# Patient Record
Sex: Male | Born: 1950 | Race: White | Hispanic: No | Marital: Married | Smoking: Never smoker
Health system: Southern US, Community
[De-identification: ages and names within clinical notes are randomized; demographics above are authoritative.]

## PROBLEM LIST (undated history)

## (undated) DIAGNOSIS — Z5181 Encounter for therapeutic drug level monitoring: Secondary | ICD-10-CM

## (undated) DIAGNOSIS — E876 Hypokalemia: Secondary | ICD-10-CM

## (undated) DIAGNOSIS — R55 Syncope and collapse: Secondary | ICD-10-CM

## (undated) DIAGNOSIS — G459 Transient cerebral ischemic attack, unspecified: Secondary | ICD-10-CM

## (undated) DIAGNOSIS — I429 Cardiomyopathy, unspecified: Secondary | ICD-10-CM

## (undated) DIAGNOSIS — I639 Cerebral infarction, unspecified: Secondary | ICD-10-CM

## (undated) DIAGNOSIS — I5022 Chronic systolic (congestive) heart failure: Secondary | ICD-10-CM

## (undated) DIAGNOSIS — H348192 Central retinal vein occlusion, unspecified eye, stable: Secondary | ICD-10-CM

## (undated) DIAGNOSIS — Z9889 Other specified postprocedural states: Secondary | ICD-10-CM

## (undated) DIAGNOSIS — R600 Localized edema: Secondary | ICD-10-CM

## (undated) DIAGNOSIS — N529 Male erectile dysfunction, unspecified: Secondary | ICD-10-CM

## (undated) DIAGNOSIS — I10 Essential (primary) hypertension: Secondary | ICD-10-CM

## (undated) DIAGNOSIS — I480 Paroxysmal atrial fibrillation: Secondary | ICD-10-CM

## (undated) DIAGNOSIS — E785 Hyperlipidemia, unspecified: Secondary | ICD-10-CM

## (undated) DIAGNOSIS — I519 Heart disease, unspecified: Secondary | ICD-10-CM

## (undated) DIAGNOSIS — I77810 Thoracic aortic ectasia: Secondary | ICD-10-CM

## (undated) DIAGNOSIS — I319 Disease of pericardium, unspecified: Secondary | ICD-10-CM

## (undated) DIAGNOSIS — Z79899 Other long term (current) drug therapy: Secondary | ICD-10-CM

## (undated) HISTORY — DX: Male erectile dysfunction, unspecified: N52.9

## (undated) HISTORY — DX: Other specified postprocedural states: Z98.890

## (undated) HISTORY — DX: Syncope and collapse: R55

## (undated) HISTORY — DX: Cardiomyopathy, unspecified: I42.9

## (undated) HISTORY — DX: Hypokalemia: E87.6

## (undated) HISTORY — DX: Thoracic aortic ectasia: I77.810

## (undated) HISTORY — DX: Essential (primary) hypertension: I10

## (undated) HISTORY — DX: Chronic systolic (congestive) heart failure: I50.22

## (undated) HISTORY — DX: Heart disease, unspecified: I51.9

## (undated) HISTORY — DX: Localized edema: R60.0

## (undated) HISTORY — DX: Central retinal vein occlusion, unspecified eye, stable: H34.8192

## (undated) HISTORY — PX: KNEE SURGERY: SHX244

## (undated) HISTORY — DX: Transient cerebral ischemic attack, unspecified: G45.9

## (undated) HISTORY — PX: PERICARDIECTOMY: SHX2214

## (undated) HISTORY — DX: Hyperlipidemia, unspecified: E78.5

## (undated) HISTORY — DX: Cerebral infarction, unspecified: I63.9

## (undated) HISTORY — DX: Disease of pericardium, unspecified: I31.9

## (undated) HISTORY — DX: Paroxysmal atrial fibrillation: I48.0

---

## 1998-10-25 ENCOUNTER — Encounter: Payer: Self-pay | Admitting: Family Medicine

## 1998-10-25 ENCOUNTER — Ambulatory Visit (HOSPITAL_COMMUNITY): Admission: RE | Admit: 1998-10-25 | Discharge: 1998-10-25 | Payer: Self-pay | Admitting: Family Medicine

## 2000-03-15 HISTORY — PX: CARDIAC CATHETERIZATION: SHX172

## 2000-07-11 ENCOUNTER — Ambulatory Visit (HOSPITAL_COMMUNITY): Admission: RE | Admit: 2000-07-11 | Discharge: 2000-07-12 | Payer: Self-pay | Admitting: *Deleted

## 2000-07-16 ENCOUNTER — Emergency Department (HOSPITAL_COMMUNITY): Admission: EM | Admit: 2000-07-16 | Discharge: 2000-07-16 | Payer: Self-pay | Admitting: Emergency Medicine

## 2001-03-15 HISTORY — PX: HAND SURGERY: SHX662

## 2001-03-15 HISTORY — PX: NASAL SINUS SURGERY: SHX719

## 2002-01-10 ENCOUNTER — Emergency Department (HOSPITAL_COMMUNITY): Admission: EM | Admit: 2002-01-10 | Discharge: 2002-01-10 | Payer: Self-pay | Admitting: Emergency Medicine

## 2002-01-10 ENCOUNTER — Encounter: Payer: Self-pay | Admitting: Emergency Medicine

## 2002-01-15 ENCOUNTER — Encounter: Payer: Self-pay | Admitting: Emergency Medicine

## 2002-01-16 ENCOUNTER — Encounter: Payer: Self-pay | Admitting: Specialist

## 2002-01-16 ENCOUNTER — Inpatient Hospital Stay (HOSPITAL_COMMUNITY): Admission: AC | Admit: 2002-01-16 | Discharge: 2002-01-20 | Payer: Self-pay

## 2002-01-30 ENCOUNTER — Encounter: Payer: Self-pay | Admitting: Emergency Medicine

## 2002-01-30 ENCOUNTER — Observation Stay (HOSPITAL_COMMUNITY): Admission: EM | Admit: 2002-01-30 | Discharge: 2002-01-31 | Payer: Self-pay | Admitting: Emergency Medicine

## 2006-11-23 ENCOUNTER — Ambulatory Visit: Payer: Self-pay | Admitting: Gastroenterology

## 2006-12-23 ENCOUNTER — Ambulatory Visit: Payer: Self-pay | Admitting: Gastroenterology

## 2008-03-15 DIAGNOSIS — I319 Disease of pericardium, unspecified: Secondary | ICD-10-CM

## 2008-03-15 HISTORY — DX: Disease of pericardium, unspecified: I31.9

## 2008-03-26 ENCOUNTER — Ambulatory Visit: Payer: Self-pay | Admitting: Internal Medicine

## 2008-03-26 ENCOUNTER — Inpatient Hospital Stay (HOSPITAL_COMMUNITY): Admission: EM | Admit: 2008-03-26 | Discharge: 2008-03-28 | Payer: Self-pay | Admitting: Emergency Medicine

## 2008-03-27 ENCOUNTER — Encounter (INDEPENDENT_AMBULATORY_CARE_PROVIDER_SITE_OTHER): Payer: Self-pay | Admitting: Internal Medicine

## 2008-04-25 ENCOUNTER — Encounter (INDEPENDENT_AMBULATORY_CARE_PROVIDER_SITE_OTHER): Payer: Self-pay | Admitting: Internal Medicine

## 2008-04-25 ENCOUNTER — Inpatient Hospital Stay (HOSPITAL_COMMUNITY): Admission: EM | Admit: 2008-04-25 | Discharge: 2008-04-26 | Payer: Self-pay | Admitting: Emergency Medicine

## 2008-10-21 ENCOUNTER — Encounter (INDEPENDENT_AMBULATORY_CARE_PROVIDER_SITE_OTHER): Payer: Self-pay | Admitting: Otolaryngology

## 2008-10-21 ENCOUNTER — Ambulatory Visit (HOSPITAL_BASED_OUTPATIENT_CLINIC_OR_DEPARTMENT_OTHER): Admission: RE | Admit: 2008-10-21 | Discharge: 2008-10-21 | Payer: Self-pay | Admitting: Otolaryngology

## 2010-04-14 ENCOUNTER — Ambulatory Visit: Payer: Self-pay | Admitting: Cardiovascular Disease

## 2010-06-20 LAB — POCT I-STAT, CHEM 8
BUN: 16 mg/dL (ref 6–23)
Calcium, Ion: 1.14 mmol/L (ref 1.12–1.32)
HCT: 51 % (ref 39.0–52.0)
Hemoglobin: 17.3 g/dL — ABNORMAL HIGH (ref 13.0–17.0)
Sodium: 141 mEq/L (ref 135–145)
TCO2: 27 mmol/L (ref 0–100)

## 2010-06-29 LAB — BASIC METABOLIC PANEL
CO2: 27 mEq/L (ref 19–32)
Calcium: 8.4 mg/dL (ref 8.4–10.5)
Calcium: 9.4 mg/dL (ref 8.4–10.5)
Creatinine, Ser: 0.94 mg/dL (ref 0.4–1.5)
GFR calc Af Amer: >60 mL/min (ref >60)
GFR calc Af Amer: >60 mL/min (ref >60)
GFR calc non Af Amer: >60 mL/min (ref >60)
GFR calc non Af Amer: >60 mL/min (ref >60)
Glucose, Bld: 106 mg/dL — ABNORMAL HIGH (ref 70–99)
Sodium: 138 mEq/L (ref 135–145)

## 2010-06-29 LAB — LIPID PANEL
LDL Cholesterol: 106 mg/dL — ABNORMAL HIGH (ref 0–99)
Total CHOL/HDL Ratio: 4.6 RATIO
Triglycerides: 114 mg/dL (ref <150)
VLDL: 23 mg/dL (ref 0–40)

## 2010-06-29 LAB — CBC
MCV: 90.7 fL (ref 78.0–100.0)
Platelets: 212 10*3/uL (ref 150–400)
RBC: 4.84 MIL/uL (ref 4.22–5.81)
WBC: 8.8 10*3/uL (ref 4.0–10.5)

## 2010-06-29 LAB — COMPREHENSIVE METABOLIC PANEL
ALT: 25 U/L (ref 0–53)
AST: 20 U/L (ref 0–37)
Albumin: 3.8 g/dL (ref 3.5–5.2)
CO2: 27 mEq/L (ref 19–32)
Chloride: 103 mEq/L (ref 96–112)
Creatinine, Ser: 0.88 mg/dL (ref 0.4–1.5)
GFR calc Af Amer: >60 mL/min (ref >60)
GFR calc non Af Amer: >60 mL/min (ref >60)
Potassium: 3.3 mEq/L — ABNORMAL LOW (ref 3.5–5.1)
Sodium: 136 mEq/L (ref 135–145)
Total Bilirubin: 1.7 mg/dL — ABNORMAL HIGH (ref 0.3–1.2)

## 2010-06-29 LAB — URINALYSIS, ROUTINE W REFLEX MICROSCOPIC
Bilirubin Urine: NEGATIVE
Glucose, UA: NEGATIVE mg/dL
Hgb urine dipstick: NEGATIVE
Specific Gravity, Urine: 1.027 (ref 1.005–1.030)
pH: 6.5 (ref 5.0–8.0)

## 2010-06-29 LAB — POCT CARDIAC MARKERS
CKMB, poc: <1 ng/mL — ABNORMAL LOW (ref 1.0–8.0)
Troponin i, poc: <0.05 ng/mL (ref 0.00–0.09)

## 2010-06-29 LAB — DIFFERENTIAL
Basophils Absolute: 0 10*3/uL (ref 0.0–0.1)
Eosinophils Absolute: 0.2 10*3/uL (ref 0.0–0.7)
Eosinophils Relative: 2 % (ref 0–5)
Lymphocytes Relative: 16 % (ref 12–46)
Lymphs Abs: 1.4 10*3/uL (ref 0.7–4.0)
Monocytes Absolute: 0.8 10*3/uL (ref 0.1–1.0)

## 2010-06-29 LAB — CK TOTAL AND CKMB (NOT AT ARMC)
CK, MB: 1 ng/mL (ref 0.3–4.0)
CK, MB: 1.1 ng/mL (ref 0.3–4.0)
Relative Index: INVALID (ref 0.0–2.5)
Total CK: 69 U/L (ref 7–232)

## 2010-06-29 LAB — URINE CULTURE
Colony Count: NO GROWTH
Culture: NO GROWTH

## 2010-06-29 LAB — BRAIN NATRIURETIC PEPTIDE: Pro B Natriuretic peptide (BNP): 34 pg/mL (ref 0.0–100.0)

## 2010-06-30 LAB — URINALYSIS, ROUTINE W REFLEX MICROSCOPIC
Glucose, UA: NEGATIVE mg/dL
Hgb urine dipstick: NEGATIVE
Ketones, ur: NEGATIVE mg/dL
Nitrite: NEGATIVE
Protein, ur: NEGATIVE mg/dL
Specific Gravity, Urine: 1.018 (ref 1.005–1.030)
Urobilinogen, UA: 0.2 mg/dL (ref 0.0–1.0)

## 2010-06-30 LAB — CBC
HCT: 43.3 % (ref 39.0–52.0)
Hemoglobin: 14.9 g/dL (ref 13.0–17.0)
Hemoglobin: 15 g/dL (ref 13.0–17.0)
MCHC: 34 g/dL (ref 30.0–36.0)
MCHC: 34.2 g/dL (ref 30.0–36.0)
MCHC: 34.3 g/dL (ref 30.0–36.0)
MCV: 90.8 fL (ref 78.0–100.0)
Platelets: 173 10*3/uL (ref 150–400)
RBC: 4.78 MIL/uL (ref 4.22–5.81)
RBC: 4.9 MIL/uL (ref 4.22–5.81)
RDW: 12.9 % (ref 11.5–15.5)
WBC: 5.7 10*3/uL (ref 4.0–10.5)
WBC: 7.7 10*3/uL (ref 4.0–10.5)

## 2010-06-30 LAB — BASIC METABOLIC PANEL
CO2: 29 mEq/L (ref 19–32)
Chloride: 109 mEq/L (ref 96–112)
Creatinine, Ser: 1.04 mg/dL (ref 0.4–1.5)
GFR calc Af Amer: >60 mL/min (ref >60)
Potassium: 4.7 mEq/L (ref 3.5–5.1)
Sodium: 141 mEq/L (ref 135–145)

## 2010-06-30 LAB — COMPREHENSIVE METABOLIC PANEL
ALT: 27 U/L (ref 0–53)
AST: 24 U/L (ref 0–37)
Albumin: 3.5 g/dL (ref 3.5–5.2)
BUN: 20 mg/dL (ref 6–23)
CO2: 27 mEq/L (ref 19–32)
Calcium: 8.5 mg/dL (ref 8.4–10.5)
Calcium: 9 mg/dL (ref 8.4–10.5)
Chloride: 107 mEq/L (ref 96–112)
GFR calc Af Amer: >60 mL/min (ref >60)
GFR calc non Af Amer: >60 mL/min (ref >60)
Glucose, Bld: 99 mg/dL (ref 70–99)
Sodium: 138 mEq/L (ref 135–145)
Sodium: 140 mEq/L (ref 135–145)
Total Protein: 5.8 g/dL — ABNORMAL LOW (ref 6.0–8.3)

## 2010-06-30 LAB — DIFFERENTIAL
Lymphs Abs: 1.8 10*3/uL (ref 0.7–4.0)
Monocytes Relative: 7 % (ref 3–12)
Neutro Abs: 4.4 10*3/uL (ref 1.7–7.7)
Neutrophils Relative %: 63 % (ref 43–77)

## 2010-06-30 LAB — POCT I-STAT, CHEM 8
Calcium, Ion: 1.09 mmol/L — ABNORMAL LOW (ref 1.12–1.32)
Chloride: 103 mEq/L (ref 96–112)
Glucose, Bld: 96 mg/dL (ref 70–99)
HCT: 44 % (ref 39.0–52.0)
TCO2: 27 mmol/L (ref 0–100)

## 2010-06-30 LAB — CARDIAC PANEL(CRET KIN+CKTOT+MB+TROPI)
CK, MB: 2.8 ng/mL (ref 0.3–4.0)
CK, MB: 3.5 ng/mL (ref 0.3–4.0)
Relative Index: 1.5 (ref 0.0–2.5)
Relative Index: 1.7 (ref 0.0–2.5)
Total CK: 182 U/L (ref 7–232)
Troponin I: <0.01 ng/mL (ref 0.00–0.06)
Troponin I: <0.01 ng/mL (ref 0.00–0.06)

## 2010-06-30 LAB — POCT CARDIAC MARKERS
CKMB, poc: <1 ng/mL — ABNORMAL LOW (ref 1.0–8.0)
Myoglobin, poc: 290 ng/mL (ref 12–200)

## 2010-06-30 LAB — BRAIN NATRIURETIC PEPTIDE: Pro B Natriuretic peptide (BNP): <30 pg/mL (ref 0.0–100.0)

## 2010-07-28 NOTE — Procedures (Signed)
EEG NUMBER:   CLINICAL HISTORY:  A 60 year old patient being evaluated for syncope  versus seizure.   This is a 17-channel EEG recorded with the patient awake and drowsy  using standard 10/20 electrode placement.   Background awake rhythm consists of 9 Hz alpha, which is of moderate  amplitude, synchronous, reactive to eye-opening and closure.  No  paroxysmal epileptiform activities, spikes, or sharp waves are noted.  Stages I and II of light sleep are achieved and showed normal  physiological findings.  Hyperventilation and photic stimulation are  both unremarkable.  Length of the recording is 21.4 minutes.  Technical  component is average.  EKG tracing revealed a regular sinus rhythm.   IMPRESSION:  This EEG performed during awake and light sleep states is  within normal limits.  No definite epileptiform features were noted.           ______________________________  Sunny Schlein. Pearlean Brownie, MD     BJY:NWGN  D:  04/25/2008 18:31:18  T:  04/26/2008 02:23:14  Job #:  [id number]

## 2010-07-28 NOTE — Consult Note (Signed)
NAMEDARALD, UZZLE NO.:  [id number]   MEDICAL RECORD NO.:  [id number]          PATIENT TYPE:  INP   LOCATION:  1844                         FACILITY:  [facility]   PHYSICIAN:  Wendi Snipes, MD DATE OF BIRTH:  1951-03-10   DATE OF CONSULTATION:  DATE OF DISCHARGE:                                 CONSULTATION   CARDIOLOGIST:  Dr. Renato Gails   CHIEF COMPLAINT:  Chest pain.   HISTORY OF PRESENT ILLNESS:  This is a 60 year old white male with  history of hypertension, hyperlipidemia, presents with approximately one  day of chest pain.  States that these symptoms occurred last night and  are described as mid chest soreness associated with deep breaths.  He  states that the pain was worse when he lay down and kept him up for most  of the night.  The pain is reported to improve with sitting up.  He  states he has never had this pain before, and he does not associate this  with exertion.  He also denies any recent illness or fevers or sick  contacts.  He does state that his brother had something similar to this  being described as inflammation around the heart approximately 3 months  ago.  He does also reports a dry cough for several months that was  thought to be secondary to an ACE inhibitor which was switched.  These  symptoms have not improved and he also describes increased lower  extremity edema for the past few months.  Otherwise, he denies  palpitations, syncope or paroxysmal nocturnal dyspnea.   PAST MEDICAL HISTORY:  1. Hypertension.  2. Hyperlipidemia.  3. Nonobstructive cardiac catheterization 2002 for chest pain.   ALLERGIES:  PENICILLIN.   MEDICATIONS ON ADMISSION:  1. Aspirin.  2. Diovan  3. Norvasc.  4. Metoprolol.  5. Zocor.   SOCIAL HISTORY:  Lives with his wife.  He has two daughters.  He does  not smoke.   FAMILY HISTORY:  Reviewed, noncontributory.   REVIEW OF SYSTEMS:  All 14-systems reviewed and were negative except as  mentioned in  HPI.   PHYSICAL EXAMINATION:  VITAL SIGNS:  Blood pressure is 140/95,  respiratory rate 16, pulse 57 and regular.  He is afebrile.  He is  satting 99% on 2 liters.  GENERAL:  He is a 60 year old white male  appearing stated age.  No acute stress.  HEENT:  Moist mucous membranes.  Pupils equal, round, react to light  accommodation.  Anicteric sclera.  NECK:  He had approximately 15 cm jugular venous distention with good  respiratory variation.  CARDIOVASCULAR:  Regular rate and rhythm.  No murmurs, rubs or gallops.  There is faint S3.  LUNGS:  Clear to auscultation bilaterally.  ABDOMEN:  Nontender, nondistended.  Positive bowel sounds.  No masses.  EXTREMITIES:  He had 2+ pitting edema to the shins.  Otherwise 2+ pulses  throughout.  NEUROLOGIC:  Alert and oriented x3.  Cranial nerves II-XII grossly  intact.  No focal neurologic deficits.  SKIN:  Warm, dry and intact.  No rashes.  PSYCH:  Mood and affect  are appropriate.   RADIOLOGY:  CTA showed a 1.5 cm right adrenal adenoma and no evidence of  pulmonary embolism.  EKG showed normal sinus rhythm with diffuse ST  segment abnormalities consistent with pericarditis with mild PR  elevation in AVR.   LABORATORY:  White blood cell count 8.8, hematocrit 44, platelets 212.  Potassium is 3.3, creatinine is 0.9.   ASSESSMENT/PLAN:  This is a 60 year old with pleuritic and positional  chest pain and EKG findings consistent with acute pericarditis.   PLAN:  Pericarditis:  This is likely viral in etiology and could be  adequately treated as his first exacerbation with NSAIDS, namely,  ibuprofen 800 mg three times daily for symptoms including aspirin 325 mg  daily.  Consider the addition of colchicine 0.5 mg twice daily for 2-3  months for the prevention of recurrence.  Please check an echocardiogram  in context of his likely pericardial inflammation though now it is  apparent that his physical exam suggested that he may be mildly volume   overloaded, though some of these findings may be secondary to his blood  pressure medications, namely Norvasc.  Please begin Lasix 20 mg daily  and encouraged the patient to adhere to a low salt diet.  Will follow.      Wendi Snipes, MD  Electronically Signed     BHH/MEDQ  D:  03/26/2008  T:  03/27/2008  Job:  [id number]

## 2010-07-28 NOTE — Discharge Summary (Signed)
Terry [name], Terry [name]             ACCOUNT NO.:  [id number]   MEDICAL RECORD NO.:  [id number]          PATIENT TYPE:  INP   LOCATION:  3733                         FACILITY:  [facility]   PHYSICIAN:  Vesta Mixer, M.D. DATE OF BIRTH:  September 15, 1950   DATE OF ADMISSION:  03/26/2008  DATE OF DISCHARGE:  03/28/2008                               DISCHARGE SUMMARY   DISCHARGE DIAGNOSES:  1. Acute pericarditis.  2. Hyperlipidemia.  3. Hypertension.  4. Hypokalemia.   DISCHARGE MEDICATIONS:  1. Aspirin 81 mg a day.  2. Motrin 400 mg 3 times a day as needed.  3. Benicar 40 mg a day.  4. Metoprolol Slow Release 100 mg a day.  5. Simvastatin 40 mg at night.  6. Potassium chloride 10 mEq a day.   DISPOSITION:  The patient will see Dr. Elease Hashimoto in 1-2 weeks for followup  of his hypertension and his pericarditis.  He is to follow up with Dr.  Nicholos Johns as needed.   HISTORY:  Terry [name] is a 60 year old gentleman who is an avid athlete.  He started having episodes of chest pain several days prior to  admission.  Please see dictated H and P for further details.   HOSPITAL COURSE:  1. Chest pain.  The patient ruled out for myocardial infarction with      serial CPK and troponin levels.  He had a followup CPK level but      was still negative this morning.  Of note is that the patient had      had a heart catheterization in 2002, which revealed smooth and      normal coronary arteries.   His EKG and his clinical symptoms were consistent with pericarditis.  He  had an echocardiogram, which revealed no pericardial effusion.  He had  normal left ventricular systolic function.  He does have mild left  atrial enlargement and trivial tricuspid regurgitation.   The patient did not have any further episodes of chest pain, and my  clinical assessment is that he does indeed have pericarditis.  We have  taken the Motrin and we will follow up as an outpatient.  He has been  instructed to call me sooner if  needed.   1. Hypertension.  The patient has had moderately elevated blood      pressures over the past several weeks.  When he came to the      hospital, we changed him to Benicar plus metoprolol and his blood      pressures have been well controlled.  We will continue with the      Benicar and metoprolol.  We will also add potassium chloride, which      will help with his hypertension.  He is to stop taking the Diovan.      We will follow up with this as an outpatient.   1. Hypercholesterolemia.  The patient will be continued on his      simvastatin.  We will continue to follow this as an outpatient.      Vesta Mixer, M.D.  Electronically Signed  PJN/MEDQ  D:  03/28/2008  T:  03/28/2008  Job:  [id number]   cc:   Molly Maduro A. Nicholos Johns, M.D.

## 2010-07-28 NOTE — Op Note (Signed)
NAMEVEARL, ALLBAUGH             ACCOUNT NO.:  [id number]   MEDICAL RECORD NO.:  [id number]          PATIENT TYPE:  AMB   LOCATION:  DSC                          FACILITY:  [facility]   PHYSICIAN:  Jefry H. Pollyann Kennedy, MD     DATE OF BIRTH:  [birth year]   DATE OF PROCEDURE:  10/21/2008  DATE OF DISCHARGE:                               OPERATIVE REPORT   PREOPERATIVE DIAGNOSES:  Chronic maxillary sinusitis, chronic frontal  sinusitis.   POSTOPERATIVE DIAGNOSES:  Chronic maxillary sinusitis, chronic frontal  sinusitis.   PROCEDURE:  Right side revision maxillary antrostomy with removal of  tissue from maxillary sinus and bilateral ethmoid and frontal recess re-  exploration.   General endotracheal anesthesia was used.   No complications.   BLOOD LOSS:  80 mL.   FINDINGS:  Completely isolated right maxillary sinus with large  mucocele, thick white and green purulent material, and polypoid debris  found within the right maxillary sinus.  The frontal recess bilaterally  with very thickened and sclerotic bone, unable to enter safely into the  frontal sinuses on either side.   HISTORY:  A 60 year old underwent sinus surgery a couple of years back  has done well until recently started having pain and pressure on the  right eye on a pretty consistent basis.  Preop radiographic imaging  revealed what appeared to be a large mucocele on the right maxillary  sinus and bilateral frontal sinus disease.  Risks, benefits,  alternatives, and complications of procedure were explained to the  patient who seemed to understand and agreed to surgery.   PROCEDURE:  The patient was taken to the operating room and placed on  the operating table in supine position.  Following induction of general  endotracheal anesthesia, the face was prepped and draped in standard  fashion.  Afrin spray was used preoperatively in the nasal cavities.  Xylocaine 1% with epinephrine was infiltrated into the superior and  posterior attachments of the middle turbinates bilaterally.  1. Right revision maxillary antrostomy with removal of tissue.  Using      a 30-degree endoscope, the infundibulum was inspected on the right      side.  The lateral nasal wall was soft and without bone and was      entered using a curved suction.  Large amount of pus was obtained.      The antrostomy was opened widely using back-biting forceps      anteriorly, flag forceps inferiorly, and Tru-Cut forceps      posteriorly.  A large amount of debris and granulation tissue and      polyp was removed as the sinus lining was completely removed.  The      sinus was open and clear following this procedure.  2. Bilateral frontal recess and ethmoid exploration.  The anterior and      inferior aspect of the middle turbinate on both sides was resected      using Tru-Cut forceps.  This facilitated inspection of the anterior      ethmoid and frontal recess region.  The frontal drainage pathway  was inspected with sinus seeker and the bone was felt to be dense      and sclerotic.  I was unable to easily enter into the frontal on      either side.  Periorbita was exposed on the left side but was not      interrupted.  A decision was made at this point not to pursue the      frontal sinus exploration as it      would be safer to perform externally if it becomes necessary in the      future.  The ethmoid cavities were packed with Afrin-soaked      pledgets 2 on each side and these were removed in the recovery room      following the procedure.  The patient was then awakened, extubated,      and transferred to recovery in stable condition.      Jefry H. Pollyann Kennedy, MD  Electronically Signed     JHR/MEDQ  D:  10/21/2008  T:  10/22/2008  Job:  [id number]

## 2010-07-28 NOTE — H&P (Signed)
Terry [name], Terry [name]             ACCOUNT NO.:  [id number]   MEDICAL RECORD NO.:  [id number]          PATIENT TYPE:  INP   LOCATION:  3729                         FACILITY:  [facility]   PHYSICIAN:  Michiel Cowboy, MDDATE OF BIRTH:  [birth year]   DATE OF ADMISSION:  04/25/2008  DATE OF DISCHARGE:                              HISTORY & PHYSICAL   PRIMARY CARE Apolo Cutshaw:  Dr. Elias Else.   CHIEF COMPLAINT:  Syncope.   HISTORY OF PRESENT ILLNESS:  The patient is a 60 year old gentleman with  past medical history significant for recent diagnosis of pericarditis in  January. The patient was at his baseline of health. Actually went to a  gym yesterday, was doing very well, feeling very well.  He woke up  tonight with a muscle cramp. He got up thinking that this would help the  muscle cramp get better. Went to the bathroom to urinate and apparently  had syncope, hitting his head.  Per his family they noted that he was  lying on the floor, legs scrunched up.  He had apparently hit the sink  with his head as well as the cabinet door because the cabinet door was  split open.  They were able to drag the patient out of the bathroom with  him being transiently awake.  Thereafter, he had another episode where  his eyes rolled back and he got stiff for a second, was holding his  breath during this moment, and then started to breathe again and came  about. No episode of postictal confusion thereafter. No loss of urine or  stool.  No tongue biting and no tonic-clonic seizure-like activity was  noted.  Currently the patient is feeling much better.  He did have some  nausea after the event but this is now resolved.  He denies completely  any chest pain or shortness of breath.  No diarrhea.  No constipation.  Prior to this he had been feeling completely fine.  He did feel slight  presyncopal sensation prior to fainting while standing up. The patient  denies any fevers or chills.   REVIEW OF  SYSTEMS:  Otherwise unremarkable.   PAST MEDICAL HISTORY:  Significant for recent episode of pericarditis,  hypertension, high cholesterol, and chronic sinusitis.   SOCIAL HISTORY:  The patient never smoked, does not use drugs.  He does  drink occasional alcohol.  Exercises on a regular basis.   ALLERGIES:  To PENICILLIN.   MEDICATIONS:  1. Metoprolol 100 mg daily.  2. Benicar car 40 mg daily.  3. Aspirin 81 mg daily.  4. Simvastatin 40 mg at bedtime.  5. Potassium chloride 10 mEq daily.  6. He was reportedly recently started on hydrochlorothiazide by Dr.      Elease Hashimoto.   FAMILY HISTORY:  Noncontributory.   PHYSICAL EXAMINATION:  VITALS:  Temperature 97.4, blood pressure 118/82,  respirations 18, heart rate 66, O2 sat apparently not obtained.  GENERAL APPEARANCE:  The patient is a 60 year old male who appears to be  in no acute distress.  HEAD:  There is a contusion over his forehead, nose, and behind ear.  No  JVD noted.  NECK: Supple.  LUNGS: Clear to auscultation bilaterally.  HEART: No murmur could be appreciated.  No rubs could be appreciated.  Otherwise regular rate and rhythm.  ABDOMEN: Soft, nontender, nondistended.  EXTREMITIES:  There are small abrasions over the left shoulder and right  knee.  Trace edema but no cyanosis or clubbing.  NEUROLOGICAL:  The patient intact.  Strength 5/5 in all 4 extremities.  Cranial nerves intact II-XII.   LABS:  White blood cell count 7, hemoglobin 15. Sodium 141, potassium  3.2, creatinine 1.2.  Cardiac enzymes negative.  UA unremarkable. Chest  x-ray negative.  CT scan of the head showing chronic sinusitis but  otherwise no changes.  EKG showing first degree AV block and sinus  rhythm. Otherwise the ST elevations noted on the EKG 1 month ago have  resolved.   ASSESSMENT AND PLAN:  This is a 60 year old gentleman with recent  history of pericarditis who now presents with syncope.  1. Syncope.  I wonder if this is related to  orthostasis as the patient      had gotten up just prior to syncopizing versus micturition syncope.      Seizure activity less likely but could not be completely ruled out      by his history.  Arrhythmia is also a possibility,  somewhat less      likely is the patient did have presyncopal sensation. Will admit to      telemetry, cycle cardiac enzymes. Given the patient had recent      pericarditis wonder if there is any possibility of post      pericarditis complications. Will repeat 2-D echo.  Given the      patient's blood pressure is slightly on the lower side now, will      hold his Benicar and hydrochlorothiazide. Continue metoprolol. The      patient's family wishes for him to be seen by a Dr. Elease Hashimoto while in-      house.  He was supposed to see him tomorrow for follow-up of his      pericarditis.  2. Questionable seizure activity. Was was not typical seizure, but      will order EEG, consider neurology consult if positive.  3. Head injury likely resulting in concussion.  Will monitor and treat      supportively.  4. Hypokalemia.  Will replace.  5. Prophylaxis. Protonix plus SCDs.      Michiel Cowboy, MD  Electronically Signed     Michiel Cowboy, MD  Electronically Signed    AVD/MEDQ  D:  04/25/2008  T:  04/25/2008  Job:  [id number]   cc:   Vesta Mixer, M.D.

## 2010-07-28 NOTE — H&P (Signed)
Terry [name], Terry [name]             ACCOUNT NO.:  [id number]   MEDICAL RECORD NO.:  [id number]          PATIENT TYPE:  INP   LOCATION:  3733                         FACILITY:  [facility]   PHYSICIAN:  Michiel Cowboy, MDDATE OF BIRTH:  1951-02-28   DATE OF ADMISSION:  03/26/2008  DATE OF DISCHARGE:                              HISTORY & PHYSICAL   PRIMARY CARE Darryl Willner:  __________ .   CHIEF COMPLAINT:  Chest pain.   HISTORY OF THE PRESENT ILLNESS:  The patient is a 60 year old gentleman  with a past medical history of hypertension and hyperlipidemia who was  at his baseline of health until about 24 hours ago when he started to  develop overall generalized malaise and not feeling well, although he  cannot really put a finger on what exactly was bothering him.  That  night he developed shortness of breath and chest pain, which seemed to  be positional, and was worse he was lying down and better when he sat up  or stood up.  This lasted until the morning.  He went to work and this  continued to bother him at which point he came to the emergency  department where an EKG showed 1-mm upsloping ST elevations in all leads  worrisome for pericarditis.  Of note, the patient has had a negative  cardiac catheterization in 2002.  [facility] was called for  admission.   REVIEW OF SYSTEMS:  On review of systems he endorses lower extremity  edema and decreased tolerance of exercise for about 3 months for about 3  weeks.  He has had worsening blood pressure control for about 3 months  or so.  No weight gain.  The patient exercises on a daily basis.  He  does endorse some dyspnea on exertion recently, but had not been before.  No chest pain with exertion.   PAST MEDICAL HISTORY:  The past medical history is significant for:  1. 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  2. Central venous retinal occlusion.  3. History of hypertension.  4. History of hyperlipidemia.   SOCIAL HISTORY:  The patient has never  smoked, but drinks about a glass  of wine per day; otherwise, it is unremarkable.  He does not use drugs.  He is  actively employed.  He exercises as well.   FAMILY HISTORY:  The patient's father required a pacemaker at the age of  40, but otherwise no early coronary artery disease or strokes.   ALLERGIES:  PENICILLIN.   MEDICATIONS:  The medications the patient is taking include:  1. Metoprolol 100 mg by mouth daily.  2. Diovan 320/25 mg by mouth daily.  3. Nasonex 2 sprays as needed.  4. Simvastatin 40 mg by mouth daily.  5. Aspirin 81 mg by mouth daily.   PHYSICAL EXAMINATION:  VITAL SIGNS:  Temperature 98.1, blood pressure  162/95, pulse 65, respirations 18 and satting 97% on room air.  GENERAL APPEARANCE:  The patient appears to be in no acute distress.  HEENT:  Head is atraumatic.  Moist mucous membranes.  LUNGS:  The lungs are clear to auscultation bilaterally.  HEART:  The heart is regular, but somewhat slow.  No murmur or rub can  be appreciated, but he has somewhat distant heart sounds,  which are  hard to discern.  EXTREMITIES:  The lower extremities are with fairly trace peripheral  edema.  Strength 5/5 has four extremities.  ABDOMEN:  The abdomen is soft, nontender and nondistended.   LABORATORY DATA:  White blood cell count 8.8 and hemoglobin 14.6.  Sodium 136, potassium 3.3 and creatinine 0.88.  Total bili 1.7 otherwise  the LFTs are normal limits.  Lipase 26.  Cardiac enzymes; I-STAT  negative.  CT scan of the chest showed no pulmonary embolus, mild  atelectasis, but otherwise was unremarkable.  EKG showed upsloping ST  elevation in leads 1, 2, 3, aVF, V2, and V3 where the ST elevation was  slightly higher  of over 2-3 mm, and in V4, V5 and V6 1-mm ST elevation  all upsloping with slightly peaked T-waves.  This was all changed from  prior EKG.   ASSESSMENT AND PLAN:  1. Chest pain.  This is worrisome for pericarditis.  We will call for      a cardiology consult  who will come and see the patient.  Per their      recommendation he was started on ibuprofen.  Actually he is on      Protonix prophylaxis.  We will cycle cardiac enzymes x3.  We will      check a fasting lipid panel and hemoglobin A-1-C.  I will admit him      to telemetry.  The patient appears to be in no acute distress.  We      will check a two-dimensional echocardiogram.  2. Hypertension.  Continue metoprolol plus Diovan.  3. Hyperlipidemia.  Continue simvastatin.  4. Prophylaxes.  Sequential compressive devices, Lovenox and Protonix.      Michiel Cowboy, MD  Electronically Signed     AVD/MEDQ  D:  03/26/2008  T:  03/27/2008  Job:  [id number]   cc:   Molly Maduro A. Nicholos Johns, M.D.

## 2010-07-28 NOTE — Consult Note (Signed)
NAMEODUS, CLASBY NO.:  [id number]   MEDICAL RECORD NO.:  [id number]          PATIENT TYPE:  INP   LOCATION:  3729                         FACILITY:  [facility]   PHYSICIAN:  Peter M. Swaziland, M.D.  DATE OF BIRTH:  July 08, 1950   DATE OF CONSULTATION:  04/25/2008  DATE OF DISCHARGE:                                 CONSULTATION   HISTORY OF PRESENT ILLNESS:  Mr. Bickford is a 60 year old white male who  we are requested to see by Dr. Suanne Marker for evaluation of syncope.  The  patient was admitted in January of this year with acute pleuritic chest  pain, felt to be possibly related to pericarditis.  His echocardiogram  at that time showed mild biatrial enlargement with normal LV function  and no pericardial effusion.  He also had a CT of the chest that was  negative.  He was treated with anti-inflammatory drugs and on followup  on April 10, 2008, his symptoms had resolved.  He did, however, have  at that time some lower extremity edema.  He was placed on HCTZ with  resolution of his edema.  The patient is an avid exerciser and worked  out for an hour last night at [employer] cycling.  Last night, he awoke  suddenly with a severe right leg cramp.  He got up to try to stretch it  out.  He subsequently went to the bathroom and while urinating he  blacked out.  He hit his head on the cabinet behind him and lined up on  the floor.  His wife found him poorly responsive with his eyes wide open  and teeth clenched.  His arm and chest muscles were rigid.  This was  followed by nausea and vomiting.  He subsequently came to and recovered.  The patient does report he has had some vagal reaction to the past.  He  did have a negative cardiac catheterization in 2002.   His past medical history is significant for hypertension,  hyperlipidemia, and hypokalemia.   He is allergic to PENICILLIN.   His medications include:  1. Aspirin 81 mg per day.  2. Benicar 40 mg per day.  3.  Metoprolol XL 100 mg daily.  4. Potassium 10 mEq daily.  5. Pravastatin 40 mg daily.  6. HCTZ 25 mg per day .  7. Motrin 400 mg t.i.d.   SOCIAL HISTORY:  He lives with his wife.  He has 2 daughters.  He does  not smoke or use alcohol.  He exercises vigorously for an hour a day.   FAMILY HISTORY:  The patient's father has a pacemaker, otherwise family  history is unremarkable.   REVIEW OF SYSTEMS:  He has had resolution of his edema.  He has had no  further chest pain.  He has no known history of any neurologic disease.  He denies any orthopnea or PND.  His appetite has been good, and bowel  and bladder habits have been normal.  All other systems were reviewed  and are negative.   PHYSICAL EXAMINATION:  GENERAL:  The patient is a well-developed white  male in no apparent distress.  VITAL SIGNS:  Blood pressure is 122/76 without orthostasis.  Pulse is 64  and regular.  Sats are 96% on room air.  HEENT:  He has a small bruise on his left occiput.  His pupils are  equal, round, and reactive to light and accommodation.  Extraocular  movements are full.  Oropharynx is clear.  NECK:  Supple without JVD, adenopathy, thyromegaly, or bruits.  LUNGS:  Clear to auscultation and percussion.  CARDIAC:  Without gallop, murmur, rub, or click.  ABDOMEN:  Soft and nontender without hepatosplenomegaly, masses, or  bruits.  EXTREMITIES:  Femoral pedal pulses are 2+ and symmetric.  He has no  lower extremity edema.  NEUROLOGIC:  He is alert and oriented x4.  Cranial nerves II-XII are  intact.  He has no focal motor or sensory deficits.   LABORATORY DATA:  ECG is normal.  Chest x-ray is normal.  White count  7700, hemoglobin 15.0,and  platelets 173,000.  Sodium is 138, potassium  3.2, chloride 103, CO2 of 29, BUN 20, creatinine 1.07, and glucose of  99.  LFTs were normal.  Point-of-care cardiac enzymes were negative x2.  Subsequent CPK was 182 with 2.8 MB and troponin was 0.02.  Urinalysis  was  negative.  CCT was negative.   IMPRESSION:  1. Syncope.  I think this is related to multiple factors including      volume depletion due to recent diuretic use and exercise.  He also      had increased vagal tone secondary to painful leg cramping and then      micturition.  Do need to rule out seizure disorder.  2. Hypertension.  3. Hypercholesterolemia.  4. Hypokalemia.   PLAN:  We would recommend holding his diuretics and repleting his  potassium.  We will repeat an echocardiogram and he has also had an EEG  performed.  Dr. Elease Hashimoto will follow up in the morning.           ______________________________  Peter M. Swaziland, M.D.     PMJ/MEDQ  D:  04/25/2008  T:  04/26/2008  Job:  [id number]   cc:   Molly Maduro A. Nicholos Johns, M.D.  Vesta Mixer, M.D.  Kela Millin, M.D.

## 2010-07-31 NOTE — Discharge Summary (Signed)
NAME:  Terry [name], Terry [name]                       ACCOUNT NO.:  [id number]   MEDICAL RECORD NO.:  [id number]                   PATIENT TYPE:  INP   LOCATION:  5030                                 FACILITY:  [facility]   PHYSICIAN:  Almedia Balls. Ranell Patrick, M.D.              DATE OF BIRTH:  08-13-1950   DATE OF ADMISSION:  01/15/2002  DATE OF DISCHARGE:  01/20/2002                                 DISCHARGE SUMMARY   ADMISSION DIAGNOSES:  1. Fracture of the right wrist, closed reduction.  2. Contusion to the right knee.  3. Closed head injury.  4. Hypertension.  5. Status post cardiac catheterization angiography.   DISCHARGE DIAGNOSES:  1. Fracture of the right wrist, closed reduction.  2. Tear to the anterior cruciate ligament, right knee.  3. Closed head injury.  4. Hypertension.  5. Status post cardiac catheterization angiography.   OPERATION:  On January 16, 2002, the patient underwent:  1. Open reduction and internal fixation of right distal radial  fracture     utilizing [employer] plate system.  2. Carpal tunnel release.  Surgery by Almedia Balls. Ranell Patrick, M.D., Wende Neighbors, P.A.-C. assisting.   CONSULTS:  Trauma service.   HISTORY OF PRESENT ILLNESS:  This 60 year old male fell down a flight of  stairs on the day of admission. He injured his right wrist, right knee and  sustained a concussion. He was evaluated by trauma surgery and cleared from  the closed head injury. His CT scan and serial observation were negative as  well. The patient had a severely comminuted displaced intra-articular distal  radial fracture. This was an unstable fracture and would reduce the function  of the wrist were  it not operatively corrected, and the above surgery was  scheduled. Due to the trauma to the wrist, carpal tunnel syndrome developed  and  release was done at the time of surgery as well.   An ACL tear was also noted on MRI of the knee.  There was also  a grade 3  tear of the posterior  horn of the medial meniscus extending through the  inferior meniscal service. A chondral injury to the medial femoral condyle  was seen as well.   HOSPITAL COURSE:  The patient was cleared for surgery by trauma concerning  his head injury. The patient tolerated the surgical procedure quite well.  Neurovascular remained intact to his fingers. The carpal tunnel release was  quite successful, and he had less numbness. Neurovascularly he remained  excellent in the fingers. Due to his multiple injuries of the upper and  lower extremities, it was felt he would benefit with a rehabilitation  program at home, and once home therapy  was in place, he was  discharged to  home.   On the day of  discharge he was  improved, stable. Vital signs were stable.   LABORATORY DATA:  Laboratory values in the hospital:  A CBC with  differential essentially within normal limits, white count was mildly  elevated at 12.7. Blood chemistries were normal. Urinalysis was negative for  urinary tract infection or any bleed into the bladder.   The electrocardiogram showed normal sinus rhythm. A chest x-ray showed no  active disease. Routine films of the knee  showed no acute bony abnormality  with joint effusion. The right wrist showed impacted comminuted fracture of  the distal right radius with apex anterior angulation. The cervical spine  showed no acute bony abnormality. The bony thorax was without acute  abnormality. A CT scan of the head showed no intracranial abnormality. An  MRI of the right knee showed abnormal appearing slope  of the ACL which was  indistinct. The distal femur sits somewhat posteriorly in the tibia. The  findings were suspicious for a complete ACL tear. A large knee effusion was  seen  and a grade 3 tear of the posterior horn and the medial meniscus  extending to the anterior meniscal surface was seen. Chondral injury to the  medial femoral condyle likely acute was seen  as well as possible  contusion  anterior horn of the lateral meniscus and regional subcutaneous edema. Grade  3 sprain of the medial collateral ligament was seen  as well.   CONDITION ON DISCHARGE:  Improved, stable.   DISPOSITION:  The patient is discharged to home.   DISCHARGE INSTRUCTIONS:  He is to wear a knee immobilizer to the injured  knee while up and about. Encourage function of his fingers. Home therapy  will ambulate the patient. Analgesics were given. He is to continue with  home medications and diet per his family physician.   FOLLOW UP:  He is to return to the center in about 10 days to see Dr.  Ranell Patrick.     Dooley L. Shela Nevin, P.A.             Almedia Balls. Ranell Patrick, M.D.    DLU/MEDQ  D:  02/20/2002  T:  02/20/2002  Job:  [id number]

## 2010-07-31 NOTE — Op Note (Signed)
NAME:  Terry [name], Terry [name]                       ACCOUNT NO.:  [id number]   MEDICAL RECORD NO.:  [id number]                   PATIENT TYPE:  INP   LOCATION:  5030                                 FACILITY:  [facility]   PHYSICIAN:  Almedia Balls. Ranell Patrick, M.D.              DATE OF BIRTH:  19-Mar-1950   DATE OF PROCEDURE:  01/16/2002  DATE OF DISCHARGE:                                 OPERATIVE REPORT   PREOPERATIVE DIAGNOSIS:  Right comminuted intra-articular displaced distal  radius fracture with post-traumatic carpal tunnel syndrome.   POSTOPERATIVE DIAGNOSIS:  Right comminuted intra-articular displaced distal  radius fracture with post-traumatic carpal tunnel syndrome.   PROCEDURES:  1. Open reduction and internal fixation, right distal radius, utilizing [employer] plate system.  2. Carpal tunnel release.   SURGEON:  Almedia Balls. Ranell Patrick, M.D.   ASSISTANT:  Wende Neighbors, P.A.   ANESTHESIA:  General anesthesia.   ESTIMATED BLOOD LOSS:  Minimal.   TOURNIQUET TIME:  75 minutes.   COUNTS:  Instrument counts.   COMPLICATIONS:  There were no complications.   FLUIDS REPLACED:  1.1 L of crystalloid was utilized.   MEDICATIONS:  Perioperative antibiotics were given.   INDICATIONS:  The patient is a 60 year old male who sustained a fall down a  flight of stairs in which he injured his right wrist, reinjured the right  knee, and sustained a concussion.  The patient was evaluated by trauma  surgery service and cleared from the closed head injury standpoint with a  negative head CT and serial observation.  The patient was noted on admission  plain films to have a severely comminuted, displaced intra-articular distal  radius fracture requiring operative reduction.  In addition, the patient has  developed carpal tunnel syndrome over the last eight to 10 hours, which had  increasing numbness in his fingers.  He has had well-perfused fingers but  just decrease in his sensory  examination.  After discussion with the patient  of the need for release of his carpal tunnel, the patient agreed for this in  addition to the ORIF of his distal radius through an FCR approach.   DESCRIPTION OF PROCEDURE:  After an adequate level of anesthesia achieved,  the patient's arm was elevated for three minutes.  The tourniquet was  inflated to 275 mmHg.  An extended incision was created over the volar wrist  overlying the FCR and extending in a zigzag fashion across the wrist crease  and into the palm, where a conventional carpal tunnel incision was utilized.  Dissection was carried sharply down through the subcutaneous tissues using  the knife and then tenotomy scissors.  Loupe magnification was utilized.  The carpal tunnel was released, allowing for decompression of the median  nerve, which was inspected.  It was noted to have significant compression  about it, and there was noted to be hyperemia after release of the pressure.  At  this point the FCR approach was utilized.  Dissection was carried sharply  down through the floor of the FCR sheath.  The flexor carpi radialis tendon  was taken medially to protect the median nerve, and care was taken not to  injure the radial artery, which was identified to the radial aspect of the  field.  Extensive comminution was noted at the fracture site.  There was  also noted to be a large volar chunk of distal radius present volar to the  radial shaft, which had probably contributed to the carpal tunnel syndrome.  Irrigation was performed, and a periosteal elevator was used to identify the  fracture fragments and to reduce them in place.  Sterile fingertraps were  utilized with 10 pounds of traction to assist in the reduction.  The radial  styloid was also noted to be significantly displaced and was relocated in  its anatomic location.  The [employer] titanium plate was then applied  to the distal radius in its usual location and fixed  using 3.5 cortical  screws to the distal radius.  A combination of threaded and nonthreaded pins  2.0 to 2.5 diameter, were then threaded into the plate into the distal  fragments, creating a fixed-angle device and stabilizing the fracture.  Excellent reduction of the dorsal comminution was noted.  There was noted to  be no dorsal defect, thus a dorsal incision was not needed.  The wrist was  taken through a full range of motion and noted to be stable during  fluoroscopy. AP and lateral views were obtained to ensure that there was no  prominence of screws.  At this point the wound was thoroughly irrigated and  closed using a combination of 2-0 Vicryl and 3-0 nylon suture.  Sterile  dressings applied.  Prior to closing the wound, we actually deflated the  tourniquet at 75 minutes to allow for inspection of any bleeding, none was  encountered, and then the wound was closed.  An above-elbow splint was then  applied with the wrist in neutral.  The patient tolerated the surgery well.  He had nice, pink finger at the end of the case with good perfusion and  minimal swelling and was taken to the recovery room in stable condition and  extubated.                                                  Almedia Balls. Ranell Patrick, M.D.    SRN/MEDQ  D:  01/16/2002  T:  01/17/2002  Job:  [id number]

## 2010-07-31 NOTE — Cardiovascular Report (Signed)
[place]  Patient:    Terry [name], Terry [name]                    MRN: [id number] Adm. Date:  [id number] Attending:  Mora Appl CC:         Willis Modena. Dreiling, M.D.   Cardiac Catheterization  REFERRING PHYSICIAN:  Willis Modena. Dreiling, M.D.  INDICATION FOR PROCEDURE:  Chest pain and acknowledgment now with significant risk factors including hypertension, male sex, and equivocal family history. A stress Cardiolite was performed and was suggestive of inferior ischemia. The patient was noted to have excellent exercise tolerance.  In view of his ongoing chest pain, it was elected to proceed with coronary angiography. Risks, benefits, and options were discussed with the patient.  DESCRIPTION OF PROCEDURE:  After obtaining written informed consent, the patient was brought to the cardiac catheterization lab in the postabsorptive state.  Preoperative sedation was achieved using IV Versed.  The right groin was prepped and draped in the usual sterile fashion.  Local anesthesia was achieved using 1% Xylocaine.  A 6-French hemostasis sheath was placed into the right femoral artery using the modified Seldinger technique.  Selective coronary angiography was performed using a JL4, JR4 Judkins catheter. Nonionic contrast was used and was hand injected.  All catheter exchanges were made over a guide wire.  The hemostasis sheath was flushed following each engagement.  FINDINGS:  The aortic pressure was 124/81, LV pressure 124/17.  A single plane ventriculogram revealed normal wall motion, ejection fraction of approximately 65%.  There was no gradient noted on pullback.  Fluoroscopy revealed no significant calcification.  Coronary angiography: 1. The left main coronary artery bifurcated into the left anterior descending    and circumflex vessel.  There was no significant disease in the left main    coronary artery. 2. Left anterior descending:  The left anterior  descending artery gave rise to    a moderate sized D-1, a small D-2, and went on to end as an apical    recurrent branch.  There was no significant disease in the left anterior    descending or its branches. 3. Circumflex vessel:  The circumflex vessel was a large caliber vessel that    gave rise to a large branching obtuse marginal, then went on to end as an    AV groove vessel. 4. Right coronary artery:  The right coronary artery was dominant for the    posterior circulation.  The right coronary artery gave rise to two small    ______ marginals, a moderate sized PDA, and two large PL branches.  There    was no significant disease in the right coronary artery or its branches.  IMPRESSION: 1. Normal coronary angiography. 2. Normal single plane ventriculogram. 3. Consider other etiologies for his chest pain. DD:  07/11/00 TD:  07/11/00 Job: [id number]

## 2010-12-22 ENCOUNTER — Other Ambulatory Visit: Payer: Self-pay | Admitting: Cardiovascular Disease

## 2011-02-12 ENCOUNTER — Encounter: Payer: Self-pay | Admitting: *Deleted

## 2011-02-12 ENCOUNTER — Ambulatory Visit: Payer: Self-pay | Admitting: Cardiovascular Disease

## 2011-02-16 ENCOUNTER — Ambulatory Visit (INDEPENDENT_AMBULATORY_CARE_PROVIDER_SITE_OTHER): Payer: BC Managed Care – PPO | Admitting: Cardiovascular Disease

## 2011-02-16 ENCOUNTER — Encounter: Payer: Self-pay | Admitting: Cardiovascular Disease

## 2011-02-16 VITALS — BP 146/90 | HR 50 | Ht 72.0 in | Wt 204.1 lb

## 2011-02-16 DIAGNOSIS — E785 Hyperlipidemia, unspecified: Secondary | ICD-10-CM | POA: Insufficient documentation

## 2011-02-16 DIAGNOSIS — I10 Essential (primary) hypertension: Secondary | ICD-10-CM

## 2011-02-16 DIAGNOSIS — I2510 Atherosclerotic heart disease of native coronary artery without angina pectoris: Secondary | ICD-10-CM

## 2011-02-16 MED ORDER — TADALAFIL 20 MG PO TABS: 20.0000 mg | ORAL_TABLET | Freq: Every day | ORAL | Status: DC | PRN

## 2011-02-16 NOTE — Assessment & Plan Note (Signed)
We'll check a lipid profile later this week he'll see him again in one year for an office visit and lipid profile.

## 2011-02-16 NOTE — Patient Instructions (Signed)
Your physician wants you to follow-up in: 1 year. You will receive a reminder letter in the mail two months in advance. If you don't receive a letter, please call our office to schedule the follow-up appointment.  

## 2011-02-16 NOTE — Assessment & Plan Note (Signed)
Terry [name] is doing very well. We'll continue with his same medications. He seems to be keeping his blood pressure under good control.

## 2011-02-16 NOTE — Progress Notes (Signed)
    Terry [name] Date of Birth  1950/04/16 [facility] [address]    [address] [place], [place]  [address] [phone]  Fax  [phone]  History of Present Illness:  Terry [name] is a 60 year old gentleman with a history of hypertension and hyperlipidemia. He's done very well since I last saw him last year. He still cycling on a regular basis. He denies any episodes of syncope or presyncope.  Current Outpatient Prescriptions on File Prior to Visit  Medication Sig Dispense Refill  . aspirin 81 MG tablet Take 81 mg by mouth daily.        [name] BYSTOLIC 20 MG TABS TAKE 1 TABLET DAILY  90 tablet  3  . Cholecalciferol (VITAMIN D) 2000 UNITS tablet Take 2,000 Units by mouth daily.        . hydrochlorothiazide (MICROZIDE) 12.5 MG capsule Take 12.5 mg by mouth daily.        . mometasone (NASONEX) 50 MCG/ACT nasal spray Place 2 sprays into the nose daily.        . pravastatin (PRAVACHOL) 40 MG tablet TAKE 1 TABLET DAILY  90 tablet  3  . telmisartan (MICARDIS) 40 MG tablet Take 40 mg by mouth daily.          Allergies  Allergen Reactions  . Penicillins     Past Medical History  Diagnosis Date  . HTN (hypertension)   . Hyperlipidemia   . Pericarditis   . Syncope     Hx of thought to be due to orthostatic hypotension  . ED (erectile dysfunction)     Past Surgical History  Procedure Date  . Cardiac catheterization 2002  . Nasal sinus surgery 2003    x2. Scar tissue build up and had to recreate his tear duct  . Knee surgery   . Hand surgery 2003  . Pericardiectomy     History  Smoking status  . Never Smoker   Smokeless tobacco  . Not on file    History  Alcohol Use  . Yes    2-3 Times a Week Wine with Meals    History reviewed. No pertinent family history.  Reviw of Systems:  Reviewed in the HPI.  All other systems are negative.  Physical Exam: BP 146/90  Pulse 50  Ht 6' (1.829 m)  Wt 204 lb 1.9 oz (92.588 kg)  BMI 27.68 kg/m2 The patient is alert  and oriented x 3.  The mood and affect are normal.   Skin: warm and dry.  Color is normal.    HEENT:   Normocephalic/atraumatic. He has no JVD. Carotids are normal. His mucous membranes are moist.  Lungs: His lung exam is clear   Heart: Regular rate S1-S2.    Abdomen: Good bowel sounds. There is no hepatosplenomegaly. His abdomen is nontender.  Extremities:  No clubbing cyanosis or edema  Neuro:   exam is nonfocal    ECG: Sinus bradycardia. Otherwise the EKG is normal.  Assessment / Plan:

## 2011-02-22 ENCOUNTER — Other Ambulatory Visit (INDEPENDENT_AMBULATORY_CARE_PROVIDER_SITE_OTHER): Payer: BC Managed Care – PPO | Admitting: *Deleted

## 2011-02-22 DIAGNOSIS — I2510 Atherosclerotic heart disease of native coronary artery without angina pectoris: Secondary | ICD-10-CM

## 2011-02-22 LAB — HEPATIC FUNCTION PANEL
ALT: 19 U/L (ref 0–53)
AST: 22 U/L (ref 0–37)
Total Protein: 6.4 g/dL (ref 6.0–8.3)

## 2011-02-22 LAB — LIPID PANEL
Cholesterol: 173 mg/dL (ref 0–200)
Triglycerides: 87 mg/dL (ref 0.0–149.0)

## 2011-02-22 LAB — BASIC METABOLIC PANEL
BUN: 21 mg/dL (ref 6–23)
CO2: 30 mEq/L (ref 19–32)
Chloride: 106 mEq/L (ref 96–112)
Creatinine, Ser: 1.1 mg/dL (ref 0.4–1.5)
Potassium: 3.6 mEq/L (ref 3.5–5.1)

## 2011-05-25 ENCOUNTER — Encounter: Payer: Self-pay | Admitting: *Deleted

## 2011-05-26 ENCOUNTER — Other Ambulatory Visit: Payer: Self-pay | Admitting: Cardiovascular Disease

## 2011-06-29 ENCOUNTER — Other Ambulatory Visit: Payer: Self-pay | Admitting: *Deleted

## 2011-06-29 MED ORDER — HYDROCHLOROTHIAZIDE 12.5 MG PO CAPS: 12.5000 mg | ORAL_CAPSULE | Freq: Every day | ORAL | Status: DC

## 2011-06-29 NOTE — Telephone Encounter (Signed)
Fax Received. Refill Completed. Bhavin Monjaraz Chowoe (R.M.A)   

## 2011-07-01 ENCOUNTER — Other Ambulatory Visit: Payer: Self-pay | Admitting: *Deleted

## 2011-07-01 MED ORDER — HYDROCHLOROTHIAZIDE 12.5 MG PO CAPS: 12.5000 mg | ORAL_CAPSULE | Freq: Every day | ORAL | Status: DC

## 2011-07-01 NOTE — Telephone Encounter (Signed)
Refilled hctz 12.5mg

## 2011-07-07 ENCOUNTER — Other Ambulatory Visit: Payer: Self-pay | Admitting: *Deleted

## 2011-09-24 ENCOUNTER — Telehealth: Payer: Self-pay | Admitting: Cardiovascular Disease

## 2011-09-24 NOTE — Telephone Encounter (Signed)
Pt to continue hctz 12.5  Mg, denies swelling and said bp has been in good ranges 130s/ 70s.

## 2011-09-24 NOTE — Telephone Encounter (Signed)
Pt calling stating he has been taking 25mg  HCTZ since December--pt went to get refill and pharmacy states they have an RX for 12.5 mg--per chart, pt is to be taking 12.5mg , and i don't see where it was changed--will forward to dr Elease Hashimoto for clarification and phone pt back with answer--nt

## 2011-09-24 NOTE — Telephone Encounter (Signed)
Have him continue whatever dose he has been taking as long as he has been doing well.

## 2011-09-24 NOTE — Telephone Encounter (Signed)
New msg Pt wants to know what dosage he is supposed to take of hctz. Please let him know

## 2011-12-01 ENCOUNTER — Other Ambulatory Visit: Payer: Self-pay | Admitting: Cardiovascular Disease

## 2011-12-01 NOTE — Telephone Encounter (Signed)
Pt needs appointment then refill can be made Fax Received. Refill Completed. Terry [name] (R.M.A)   

## 2012-01-25 ENCOUNTER — Other Ambulatory Visit: Payer: Self-pay | Admitting: *Deleted

## 2012-01-25 MED ORDER — TELMISARTAN 40 MG PO TABS: 40.0000 mg | ORAL_TABLET | Freq: Every day | ORAL | Status: DC

## 2012-01-25 NOTE — Telephone Encounter (Signed)
Pt needs appointment then refill can be made Fax Received. Refill Completed. Zikeria Keough Chowoe (R.M.A)   

## 2012-02-25 ENCOUNTER — Other Ambulatory Visit: Payer: Self-pay | Admitting: *Deleted

## 2012-02-25 NOTE — Telephone Encounter (Signed)
Opened in Error.

## 2012-02-29 ENCOUNTER — Telehealth: Payer: Self-pay | Admitting: *Deleted

## 2012-02-29 NOTE — Telephone Encounter (Signed)
Pt informed that he needs a yearly app, pt needs refill for cialis. Told him he could get med from pcp if he don't need to be seen here, pt declined me making an app for him at this time.

## 2012-12-06 ENCOUNTER — Emergency Department (HOSPITAL_COMMUNITY): Payer: BC Managed Care – PPO

## 2012-12-06 ENCOUNTER — Encounter (HOSPITAL_COMMUNITY): Payer: Self-pay | Admitting: Emergency Medicine

## 2012-12-06 ENCOUNTER — Emergency Department (HOSPITAL_COMMUNITY)
Admission: EM | Admit: 2012-12-06 | Discharge: 2012-12-06 | Disposition: A | Discharge status: Home / Self Care | Payer: BC Managed Care – PPO | Attending: Emergency Medicine | Admitting: Emergency Medicine

## 2012-12-06 ENCOUNTER — Telehealth: Payer: Self-pay | Admitting: Cardiovascular Disease

## 2012-12-06 DIAGNOSIS — Z7982 Long term (current) use of aspirin: Secondary | ICD-10-CM | POA: Insufficient documentation

## 2012-12-06 DIAGNOSIS — R0789 Other chest pain: Secondary | ICD-10-CM | POA: Insufficient documentation

## 2012-12-06 DIAGNOSIS — Z86718 Personal history of other venous thrombosis and embolism: Secondary | ICD-10-CM | POA: Insufficient documentation

## 2012-12-06 DIAGNOSIS — Z8639 Personal history of other endocrine, nutritional and metabolic disease: Secondary | ICD-10-CM | POA: Insufficient documentation

## 2012-12-06 DIAGNOSIS — Z79899 Other long term (current) drug therapy: Secondary | ICD-10-CM | POA: Insufficient documentation

## 2012-12-06 DIAGNOSIS — Z862 Personal history of diseases of the blood and blood-forming organs and certain disorders involving the immune mechanism: Secondary | ICD-10-CM | POA: Insufficient documentation

## 2012-12-06 DIAGNOSIS — Z87448 Personal history of other diseases of urinary system: Secondary | ICD-10-CM | POA: Insufficient documentation

## 2012-12-06 DIAGNOSIS — E785 Hyperlipidemia, unspecified: Secondary | ICD-10-CM | POA: Insufficient documentation

## 2012-12-06 DIAGNOSIS — Z9889 Other specified postprocedural states: Secondary | ICD-10-CM | POA: Insufficient documentation

## 2012-12-06 DIAGNOSIS — I10 Essential (primary) hypertension: Secondary | ICD-10-CM | POA: Insufficient documentation

## 2012-12-06 DIAGNOSIS — Z880 Allergy status to penicillin: Secondary | ICD-10-CM | POA: Insufficient documentation

## 2012-12-06 LAB — CBC WITH DIFFERENTIAL/PLATELET
Basophils Relative: 0 % (ref 0–1)
Eosinophils Absolute: 0.3 10*3/uL (ref 0.0–0.7)
Eosinophils Relative: 3 % (ref 0–5)
Hemoglobin: 15.5 g/dL (ref 13.0–17.0)
Lymphocytes Relative: 13 % (ref 12–46)
Lymphs Abs: 1.2 10*3/uL (ref 0.7–4.0)
MCH: 30.5 pg (ref 26.0–34.0)
MCHC: 34.5 g/dL (ref 30.0–36.0)
MCV: 88.2 fL (ref 78.0–100.0)
Monocytes Absolute: 0.7 10*3/uL (ref 0.1–1.0)
Monocytes Relative: 8 % (ref 3–12)
Neutrophils Relative %: 77 % (ref 43–77)
Platelets: 187 10*3/uL (ref 150–400)
RBC: 5.09 MIL/uL (ref 4.22–5.81)

## 2012-12-06 LAB — BASIC METABOLIC PANEL
BUN: 16 mg/dL (ref 6–23)
CO2: 22 mEq/L (ref 19–32)
Calcium: 9 mg/dL (ref 8.4–10.5)
Chloride: 103 mEq/L (ref 96–112)
Creatinine, Ser: 0.99 mg/dL (ref 0.50–1.35)
GFR calc Af Amer: >90 mL/min (ref >90)
GFR calc non Af Amer: 87 mL/min — ABNORMAL LOW (ref >90)
Glucose, Bld: 98 mg/dL (ref 70–99)
Potassium: 3.6 mEq/L (ref 3.5–5.1)
Sodium: 137 mEq/L (ref 135–145)

## 2012-12-06 LAB — TROPONIN I: Troponin I: <0.3 ng/mL (ref <0.30)

## 2012-12-06 LAB — D-DIMER, QUANTITATIVE (NOT AT ARMC): D-Dimer, Quant: <0.27 ug/mL-FEU (ref 0.00–0.48)

## 2012-12-06 MED ORDER — KETOROLAC TROMETHAMINE 15 MG/ML IJ SOLN
15.0000 mg | Freq: Once | INTRAMUSCULAR | Status: AC
  Administered 2012-12-06: 15 mg via INTRAVENOUS
  Filled 2012-12-06: qty 1

## 2012-12-06 MED ORDER — NITROGLYCERIN 0.4 MG SL SUBL: 0.4000 mg | SUBLINGUAL_TABLET | SUBLINGUAL | Status: DC | PRN

## 2012-12-06 NOTE — ED Notes (Signed)
Pt presents to ED with c/o central chest pain, onset on awakening at around 0430.  Pt states "Pain feels like something hit me, and I also feel gurgling in my chest".  Pt states he has hx of Pericarditis. Pt denies dizziness or nausea or headache; pt's skin warm and dry.

## 2012-12-06 NOTE — Telephone Encounter (Signed)
Left msg i will try to call him one more time since phones are off at 5 pm and he will not be able to call.

## 2012-12-06 NOTE — ED Provider Notes (Signed)
CSN: [id number]     Arrival date & time 12/06/12  0551 History   None    Chief Complaint  Patient presents with  . 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 Patient states the pain woke him from sleep last night and has been constant since onset. Patient states the pain has been unchanged and is mildly worsened when leaning forward. Patient describes the pain as an aching, "hurting" pain which radiates from his lower sternum to his left and right lower chest as well as up to his neck. Patient also states he notices a "gurgling" sensation in his lower sternum/epigastric region that he feels is in conjunction with his chest pain. Patient denies shortness of breath, but states that he feels as though he is taking shallower breaths so as not to worsen his chest pain. Patient further denies associated jaw pain, numbness/tingling, extremity weakness, nausea or vomiting, and near-syncope or syncope. Patient was followed by Dr. Elease Hashimoto of [facility] cardiology for pericarditis 5 years ago, but has not followed up in the last few years. Patient also has hx of clean cardiac catheterization in 2002.  Patient is a 62 y.o. male presenting with chest pain. The history is provided by the patient. No language interpreter was used.  Chest Pain Associated symptoms: no fever, no nausea, no numbness, no shortness of breath, not vomiting and no weakness     Past Medical History  Diagnosis Date  . Hyperlipidemia   . Pericarditis   . Syncope     Hx of thought to be due to orthostatic hypotension  . ED (erectile dysfunction)   . Hypokalemia   . Chest pain   . Leg edema   . Fatigue   . HTN (hypertension)     orthostatic   Past Surgical History  Procedure  Laterality Date  . Cardiac catheterization  2002  . Nasal sinus surgery  2003    x2. Scar tissue build up and had to recreate his tear duct  . Knee surgery    . Hand surgery  2003  . Pericardiectomy     Family History  Problem Relation Age of Onset  . Atrial fibrillation Father   . Heart disease Father     had pacemaker  . Hypertension Father   . Diabetes Father   . Hypertension Mother   . Diabetes Mother   . Cancer Mother    History  Substance Use Topics  . Smoking status: Never Smoker   . Smokeless tobacco: Not on file  . Alcohol Use: Yes     Comment: 2-3 Times a Week Wine with Meals    Review of Systems  Constitutional: Negative for fever.  Respiratory: Negative for shortness of breath.   Cardiovascular: Positive for chest pain.  Gastrointestinal: Negative for nausea and vomiting.  Neurological: Negative for weakness and numbness.  All other systems reviewed and are negative.    Allergies  Penicillins  Home Medications   Current Outpatient Rx  Name  Route  Sig  Dispense  Refill  . aspirin 81 MG tablet   Oral   Take 81 mg by mouth daily.           [name] BYSTOLIC 20 MG TABS      TAKE 1 TABLET DAILY   90 tablet   0   .  Cholecalciferol (VITAMIN D) 2000 UNITS tablet   Oral   Take 2,000 Units by mouth daily.           . irbesartan-hydrochlorothiazide (AVALIDE) 150-12.5 MG per tablet               . OVER THE COUNTER MEDICATION   Oral   Take 3 capsules by mouth as needed (when working out). VOLCANO pre workout 1 mg Vitamin b 6 Nitric oxide 1520 mg L-citruline agmapre Agmatine sulfate L-norvaline Creatine strength complex         . pravastatin (PRAVACHOL) 40 MG tablet      TAKE 1 TABLET DAILY   90 tablet   3   . tadalafil (CIALIS) 20 MG tablet   Oral   Take 1 tablet (20 mg total) by mouth daily as needed.   15 tablet   5    BP 157/96  Pulse 66  Temp(Src) 98.4 F (36.9 C) (Oral)  Resp 16  Ht 6\' 1"  (1.854 m)  Wt 208 lb (94.348 kg)   BMI 27.45 kg/m2  SpO2 97%  Physical Exam  Nursing note and vitals reviewed. Constitutional: He is oriented to person, place, and time. He appears well-developed and well-nourished. No distress.  HENT:  Head: Normocephalic and atraumatic.  Mouth/Throat: Oropharynx is clear and moist. No oropharyngeal exudate.  Eyes: Conjunctivae and EOM are normal. Pupils are equal, round, and reactive to light. No scleral icterus.  Neck: Normal range of motion. Neck supple.  Cardiovascular: Normal rate, regular rhythm, normal heart sounds and intact distal pulses.   No carotid bruits appreciated bilaterally  Pulmonary/Chest: Effort normal and breath sounds normal. No respiratory distress. He has no wheezes. He exhibits tenderness (midsternal with palpation).  Abdominal: Soft. He exhibits no distension. There is no tenderness. There is no rebound and no guarding.  Musculoskeletal: Normal range of motion.  Neurological: He is alert and oriented to person, place, and time.  Skin: Skin is warm and dry. No rash noted. He is not diaphoretic. No erythema. No pallor.  Psychiatric: He has a normal mood and affect. His behavior is normal.    ED Course  Procedures (including critical care time)  Labs Review Labs Reviewed  BASIC METABOLIC PANEL - Abnormal; Notable for the following:    GFR calc non Af Amer 87 (*)    All other components within normal limits  CBC WITH DIFFERENTIAL  TROPONIN I  D-DIMER, QUANTITATIVE  TROPONIN I    Date: 12/06/2012  Rate: 70  Rhythm: normal sinus rhythm  QRS Axis: normal  Intervals: normal  ST/T Wave abnormalities: normal  Conduction Disutrbances:borderline AV conduction delay  Narrative Interpretation: NSR with borderline AV conduction delay; no diffuse ST elevation or STEMI  Old EKG Reviewed: unchanged from 02/16/2011 I have personally reviewed and interpreted this EKG  Imaging Review Dg Chest 2 View  12/06/2012   CLINICAL DATA:  Chest pain and heaviness.  EXAM:  CHEST  2 VIEW  COMPARISON:  PA and lateral chest 04/25/2008.  FINDINGS: Heart size and mediastinal contours are within normal limits. Both lungs are clear. Visualized skeletal structures are unremarkable.  IMPRESSION: No active cardiopulmonary disease.   Electronically Signed   By: Drusilla Kanner M.D.   On: 12/06/2012 06:44    MDM   1. Atypical chest pain    62 year old male with a history of pericarditis in 2010 presents for chest pain with onset this morning. Patient is well and nontoxic appearing on arrival and hemodynamically stable. He is  afebrile. Physical exam findings as above. Laboratory workup today is negative; d-dimer within normal limits and troponin <0.30 x 2. EKG unchanged from prior and with no evidence of diffuse ST elevation or STEMI. Chest x-ray without evidence of pneumonia, pneumothorax, pleural effusion, or other cardiopulmonary abnormality. Ultrasound done at bedside by Dr. Jodi Mourning without evidence of pericardial effusion. Patient asymptomatic of chest pain after being given Toradol.   Given reassuring workup in the ED today, doubt pericarditis, ACS, or pulmonary embolism. Believe patient to be stable for outpatient followup with cardiology. Have discussed patient case with [employer] who is in agreement with this plan and stability for outpatient follow up. Patient also verbalizes comfort and understanding with this discharge plan. Return precautions discussed, to which patient verbalizes understanding; he is stable for d/c.    Antony Madura, PA-C 12/07/12 0818  Medical screening examination/treatment/procedure(s) were conducted as a shared visit with non-physician practitioner(s) or resident and myself. I personally evaluated the patient during the encounter and agree with the findings and plan unless otherwise indicated.  Constant ache anterior, similar to previous but not as severe as pericarditis hx. Last cath nl ~10 yrs prior. HTN, FH cardiac and lipids. Hx of dvt post  surgery. Mild pleuritic component, no recent surgery. No unilateral leg pain or swelling. Low risk PE. Pain since 430 am. EKG reviewed, no acute findings. Bedside US normal EF, no effusion. Pain improving in ED. Pt had asa. Plan for D dimer, delta troponin and either close fup outpt with cardio or observation.  EMERGENCY DEPARTMENT [place] CARDIAC EXAM  "Study: Limited Ultrasound of the heart and pericardium"  INDICATIONS:cp, pericarditis hx  Multiple views of the heart and pericardium were obtained in real-time with a multi-frequency probe.  PERFORMED JX:BJYNWG  IMAGES ARCHIVED?: Yes  FINDINGS: No pericardial effusion, Normal contractility and Tamponade physiology absent  LIMITATIONS: Body habitus  VIEWS USED: Subcostal 4 chamber, Parasternal long axis, Parasternal short axis and Apical 4 chamber  INTERPRETATION: Cardiac activity present, Pericardial effusioin absent, Cardiac tamponade absent and Normal contractility      Enid Skeens, MD 12/07/12 1622

## 2012-12-06 NOTE — Telephone Encounter (Signed)
Contacted pt, I reviewed ED chart except DC recommendations/ note not completed yet in ED.  Labs/ cxr/ d dimer all normal.  Pt was given ibuprofen to take for sternal pain. Pt unwilling to accept app sooner to see np/pa. Dr Elease Hashimoto is not in clinic till 12/13/12, Offered to be seen in [place] 10/1 morning, pt declined, Unsure of schedule 10/1 in afternoon in [address] office due to hold on the schedule.  Pt said," never mind, I will figure it out" and hung up.

## 2012-12-06 NOTE — Telephone Encounter (Signed)
New Problem  Pt was recently discharged from the hospital and the orders are to Follow up with Terry [name]., MD Today. (Call for appointment today for outpatient follow up and to schedule a stress test)/// There are no orders in the system for a stress test.. Advised pt to schedul EPH with NP or PA pt declined. Pt states he needs an appointment soon to schedule around work schedule.

## 2012-12-06 NOTE — ED Notes (Signed)
Pt reports taking Aspirin 81 mg 4 tablets at around 0445.

## 2012-12-07 NOTE — Telephone Encounter (Signed)
Will be glad to see him if he can come at some time when I'm in the office

## 2012-12-24 ENCOUNTER — Encounter: Payer: Self-pay | Admitting: Cardiovascular Disease

## 2012-12-26 ENCOUNTER — Encounter: Payer: Self-pay | Admitting: Cardiovascular Disease

## 2012-12-26 ENCOUNTER — Ambulatory Visit (INDEPENDENT_AMBULATORY_CARE_PROVIDER_SITE_OTHER): Payer: BC Managed Care – PPO | Admitting: Cardiovascular Disease

## 2012-12-26 VITALS — BP 150/94 | HR 52 | Ht 73.0 in | Wt 211.0 lb

## 2012-12-26 DIAGNOSIS — E785 Hyperlipidemia, unspecified: Secondary | ICD-10-CM

## 2012-12-26 DIAGNOSIS — I10 Essential (primary) hypertension: Secondary | ICD-10-CM

## 2012-12-26 DIAGNOSIS — R0789 Other chest pain: Secondary | ICD-10-CM

## 2012-12-26 NOTE — Assessment & Plan Note (Signed)
Terry [name] presents today for followup of a recent episode of chest discomfort. The episode lasted for several hours and despite this serial troponin levels were negative. His EKG was unremarkable. His chest x-ray was normal.  He's had a normal cardiac apposition in 2002 by Dr. [name].  Since that time he has not had any further episodes of pain. He has been working out at [employer] without difficulties.  I think there is a very low likelihood that he has any significant coronary artery disease.  I seen again in

## 2012-12-26 NOTE — Patient Instructions (Signed)
Your physician wants you to follow-up in: 1 year  You will receive a reminder letter in the mail two months in advance. If you don't receive a letter, please call our office to schedule the follow-up appointment.  Your physician recommends that you continue on your current medications as directed. Please refer to the Current Medication list given to you today.  

## 2012-12-26 NOTE — Progress Notes (Signed)
Terry [name] Date of Birth  1950-07-07 [facility] [address]    [address] [place], [place]  [address] [phone]  Fax  [phone]   Problem List 1. Hx of chest pain: Normal cath in 2002 Terry [name])  A single plane ventriculogram revealed normal wall motion, ejection fraction  of approximately 65%. There was no gradient noted on pullback.  Fluoroscopy revealed no significant calcification.  Coronary angiography:  1. The left main coronary artery bifurcated into the left anterior descending  and circumflex vessel. There was no significant disease in the left main  coronary artery.  2. Left anterior descending: The left anterior descending artery gave rise to  a moderate sized D-1, a small D-2, and went on to end as an apical  recurrent branch. There was no significant disease in the left anterior  descending or its branches.  3. Circumflex vessel: The circumflex vessel was a large caliber vessel that  gave rise to a large branching obtuse marginal, then went on to end as an  AV groove vessel.  4. Right coronary artery: The right coronary artery was dominant for the  posterior circulation. The right coronary artery gave rise to two small  ______ marginals, a moderate sized PDA, and two large PL branches. There  was no significant disease in the right coronary artery or its branches.  History of Present Illness:  Terry [name] is a 62 year old gentleman with a history of hypertension and hyperlipidemia. He's done very well since I last saw him last year. He still cycling on a regular basis. He denies any episodes of syncope or presyncope.  Oct. 14, 2014:  Terry [name] was seen in the ER several weeks ago for chest pain.   On Sept. 24 - he awoke with CP.  He reports having some leg cramping earlier that night.  He has been lifting weights and does not think it was MSK.   The pain was mid sternal, radiated outward,  Pulsating like CP.  The pain lasted for several hours  and he went to the ED.  The pain resolved with Toradol.   He had some residual CP for then next several days.  He took motrin - the discomfort was gone after several days.    He has esopageal pain with  Eating dry rice, dry chicken.   Current Outpatient Prescriptions on File Prior to Visit  Medication Sig Dispense Refill  . aspirin 81 MG tablet Take 81 mg by mouth daily.        [name] BYSTOLIC 20 MG TABS TAKE 1 TABLET DAILY  90 tablet  0  . irbesartan-hydrochlorothiazide (AVALIDE) 150-12.5 MG per tablet Take 1 tablet by mouth daily.       [name] OVER THE COUNTER MEDICATION Take 3 capsules by mouth as needed (when working out). VOLCANO pre workout 1 mg Vitamin b 6 Nitric oxide 1520 mg L-citruline agmapre Agmatine sulfate L-norvaline Creatine strength complex      . pravastatin (PRAVACHOL) 40 MG tablet TAKE 1 TABLET DAILY  90 tablet  3  . tadalafil (CIALIS) 20 MG tablet Take 1 tablet (20 mg total) by mouth daily as needed.  15 tablet  5  . Cholecalciferol (VITAMIN D) 2000 UNITS tablet Take 2,000 Units by mouth daily.         No current facility-administered medications on file prior to visit.    Allergies  Allergen Reactions  . Penicillins     Past Medical History  Diagnosis Date  .  Hyperlipidemia   . Pericarditis   . Syncope     Hx of thought to be due to orthostatic hypotension  . ED (erectile dysfunction)   . Hypokalemia   . Chest pain   . Leg edema   . Fatigue   . HTN (hypertension)     orthostatic    Past Surgical History  Procedure Laterality Date  . Cardiac catheterization  2002  . Nasal sinus surgery  2003    x2. Scar tissue build up and had to recreate his tear duct  . Knee surgery    . Hand surgery  2003  . Pericardiectomy      History  Smoking status  . Never Smoker   Smokeless tobacco  . Not on file    History  Alcohol Use  . Yes    Comment: 2-3 Times a Week Wine with Meals    Family History  Problem Relation Age of Onset  . Atrial fibrillation  Father   . Heart disease Father     had pacemaker  . Hypertension Father   . Diabetes Father   . Hypertension Mother   . Diabetes Mother   . Cancer Mother     Reviw of Systems:  Reviewed in the HPI.  All other systems are negative.  Physical Exam: BP 150/94  Pulse 52  Ht 6\' 1"  (1.854 m)  Wt 211 lb (95.709 kg)  BMI 27.84 kg/m2 The patient is alert and oriented x 3.  The mood and affect are normal.   Skin: warm and dry.  Color is normal.    HEENT:   Normocephalic/atraumatic. He has no JVD. Carotids are normal. His mucous membranes are moist.  Lungs: His lung exam is clear   Heart: Regular rate S1-S2.    Abdomen: Good bowel sounds. There is no hepatosplenomegaly. His abdomen is nontender.  Extremities:  No clubbing cyanosis or edema  Neuro:   exam is nonfocal    ECG: Oct. 14, 2014:  Sinus bradycardia at 52 . Otherwise the EKG is normal.  No evidence of pericarditis.  Assessment / Plan:

## 2013-01-18 ENCOUNTER — Other Ambulatory Visit: Payer: Self-pay

## 2015-08-08 ENCOUNTER — Encounter: Payer: Self-pay | Admitting: Physician Assistant

## 2015-08-08 NOTE — Progress Notes (Addendum)
Cardiology Office Note    Date:  08/12/2015  ID:  Petro Malette, DOB [birth year], MRN [id number] PCP:  Harle Battiest, MD  Cardiologist:  Nahser   Chief Complaint: discuss atrial fibrillation  History of Present Illness:  Terry [name] is a 65 y.o. male with history of HTN, HLD, h/o syncope felt r/t orthostasis, pericarditis 2010, normal cors 2002, central retinal vein occlusion with possible mini-stroke at that time (age 85) s/p laser therapy who was referred by his PCP for newly recognized atrial fibrillation. 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 He recently saw his PCP 07/10/15 and had noted some sluggishness and was noted to be in atrial fibrillation. He was completely unaware that anything had changed. He remains very active and goes to the gym daily. He denies any CP, SOB, syncope or bleeding issues. He has felt occasional palpitation lying on his left side but nothing consistent. No excess caffeine or alcohol intake. He does take a pre-workout pill supplement that is supposed to increase nitric oxide.   Labs 07/16/15: glu 99, BUN 14, Cr 1.13, Na 142, K 3.6, Cl 104, CO2 31, albumin 3.9, AST 19, ALT 20, TSH 3.47, Tchol 225, Trig 149, HLD 42, LDL 154. EKG showed atrial fib 95bpm nonspecific changes.   Past Medical History  Diagnosis Date  . Hyperlipidemia   . Pericarditis 2010  . Syncope     Hx of thought to be due to orthostatic hypotension  . ED (erectile dysfunction)   . Hypokalemia   . Leg edema   . HTN (hypertension)   . H/O cardiac catheterization 2002 - normal cors    Past Surgical History  Procedure Laterality Date  . Cardiac catheterization  2002  . Nasal sinus surgery  2003    x2. Scar tissue build up and had to recreate his tear duct  . Knee surgery    . Hand surgery  2003  . Pericardiectomy      Current Medications: Outpatient Prescriptions Prior to Visit  Medication Sig Dispense  Refill  . Cholecalciferol (VITAMIN D) 2000 UNITS tablet Take 2,000 Units by mouth daily.      . irbesartan-hydrochlorothiazide (AVALIDE) 150-12.5 MG per tablet Take 1 tablet by mouth daily.     [name] OVER THE COUNTER MEDICATION Take 3 capsules by mouth as needed (when working out). VOLCANO pre workout 1 mg Vitamin b 6 Nitric oxide 1520 mg L-citruline agmapre Agmatine sulfate L-norvaline Creatine strength complex    . tadalafil (CIALIS) 20 MG tablet Take 1 tablet (20 mg total) by mouth daily as needed. 15 tablet 5  . aspirin 81 MG tablet Take 81 mg by mouth daily.      [name] BYSTOLIC 20 MG TABS TAKE 1 TABLET DAILY 90 tablet 0  . pravastatin (PRAVACHOL) 40 MG tablet TAKE 1 TABLET DAILY 90 tablet 3   No facility-administered medications prior to visit.     Allergies:   Penicillins   Social History   Social History  . Marital Status: Married    Spouse Name: N/A  . Number of Children: N/A  . Years of Education: N/A   Social History Main Topics  . Smoking status: Never Smoker   . Smokeless tobacco: None  . Alcohol Use: Yes     Comment: 2-3 Times a Week Wine with Meals  . Drug Use: None  . Sexual Activity: Not Asked   Other Topics Concern  . None  Social History Narrative     Family History:  The patient's family history includes Atrial fibrillation in his father; Cancer in his mother; Diabetes in his father and mother; Heart disease in his father; Hypertension in his father and mother.   ROS:   Please see the history of present illness.  All other systems are reviewed and otherwise negative.    PHYSICAL EXAM:   VS:  BP 130/86 mmHg  Pulse 69  Ht 6\' 1"  (1.854 m)  Wt 215 lb 6.4 oz (97.705 kg)  BMI 28.42 kg/m2  BMI: Body mass index is 28.42 kg/(m^2). GEN: Well nourished, well developed WM, in no acute distress HEENT: normocephalic, atraumatic Neck: no JVD, carotid bruits, or masses Cardiac: irregularly irregular; no murmurs, rubs, or gallops, no edema  Respiratory:  clear to  auscultation bilaterally, normal work of breathing GI: soft, nontender, nondistended, + BS MS: no deformity or atrophy Skin: warm and dry, no rash Neuro:  Alert and Oriented x 3, Strength and sensation are intact, follows commands Psych: euthymic mood, full affect  Wt Readings from Last 3 Encounters:  08/12/15 215 lb 6.4 oz (97.705 kg)  12/26/12 211 lb (95.709 kg)  12/06/12 208 lb (94.348 kg)      Studies/Labs Reviewed:   EKG:  EKG was ordered today and personally reviewed by me. The EKG ordered today demonstrates atrial fib 69bpm, one PVC, nonspecific T wave changes  Recent Labs: No results found for requested labs within last 365 days.   Lipid Panel    Component Value Date/Time   CHOL 173 02/22/2011 0914   TRIG 87.0 02/22/2011 0914   HDL 40.50 02/22/2011 0914   CHOLHDL 4 02/22/2011 0914   VLDL 17.4 02/22/2011 0914   LDLCALC 115* 02/22/2011 0914    Additional studies/ records that were reviewed today include: Summarized above.    ASSESSMENT & PLAN:   1. Atrial fibrillation - unknown if paroxysmal or persistent. He is asymptomatic. CHADSVASC is definitely 1 for HTN, possibly 3 if central retinal vein occlusion did in fact represent embolic event - d/w Dr. Acie Fredrickson - recommend d/c of aspirin and initiation of Eliquis 5mg  BID. Rate is controlled on present regimen. Check baseline CBC. BMET/TSH were already done as above. Check echocardiogram. Will bring back in 3-4 weeks to discuss potential attempt at DCCV. If he goes back into AF after cardioversion and remains asymptomatic with normal LV function, would likely pursue rate control strategy only. Risks/benefits of blood thinner d/w patient. I also asked him to avoid the pre-workout supplement because we cannot be sure of its contribution to atrial fib since the ingredients aren't tested - besides the B vitamins I do not recognize several of the ingredients on the bottle. 2. Essential HTN - controlled. Continue present  regimen. 3. Hyperlipidemia - continue statin. Followed by PCP.  Disposition: F/u with APP or Dr. Acie Fredrickson in 3-4 weeks to discuss possible DCCV.   Medication Adjustments/Labs and Tests Ordered: Current medicines are reviewed at length with the patient today.  Concerns regarding medicines are outlined above. Medication changes, Labs and Tests ordered today are listed in the Patient Instructions below.  Raechel Ache PA-C  08/12/2015 9:48 AM    [place], [place], [place]  [address] Phone: [phone]; Fax: [phone]

## 2015-08-12 ENCOUNTER — Ambulatory Visit (INDEPENDENT_AMBULATORY_CARE_PROVIDER_SITE_OTHER): Payer: Managed Care, Other (non HMO) | Admitting: Physician Assistant

## 2015-08-12 ENCOUNTER — Encounter: Payer: Self-pay | Admitting: Physician Assistant

## 2015-08-12 VITALS — BP 130/86 | HR 69 | Ht 73.0 in | Wt 215.4 lb

## 2015-08-12 DIAGNOSIS — E785 Hyperlipidemia, unspecified: Secondary | ICD-10-CM | POA: Diagnosis not present

## 2015-08-12 DIAGNOSIS — I480 Paroxysmal atrial fibrillation: Secondary | ICD-10-CM

## 2015-08-12 DIAGNOSIS — I10 Essential (primary) hypertension: Secondary | ICD-10-CM | POA: Diagnosis not present

## 2015-08-12 LAB — MAGNESIUM: Magnesium: 2.1 mg/dL (ref 1.5–2.5)

## 2015-08-12 LAB — CBC WITH DIFFERENTIAL/PLATELET
BASOS ABS: 124 {cells}/uL (ref 0–200)
Basophils Relative: 2 %
Eosinophils Absolute: 124 cells/uL (ref 15–500)
Eosinophils Relative: 2 %
HCT: 50.8 % — ABNORMAL HIGH (ref 38.5–50.0)
Hemoglobin: 17.1 g/dL (ref 13.2–17.1)
LYMPHS PCT: 25 %
Lymphs Abs: 1550 cells/uL (ref 850–3900)
MCH: 30.5 pg (ref 27.0–33.0)
MCHC: 33.7 g/dL (ref 32.0–36.0)
MCV: 90.6 fL (ref 80.0–100.0)
MONOS PCT: 9 %
MPV: 10 fL (ref 7.5–12.5)
Monocytes Absolute: 558 cells/uL (ref 200–950)
NEUTROS ABS: 3844 {cells}/uL (ref 1500–7800)
Neutrophils Relative %: 62 %
PLATELETS: 217 10*3/uL (ref 140–400)
RBC: 5.61 MIL/uL (ref 4.20–5.80)
RDW: 13.7 % (ref 11.0–15.0)
WBC: 6.2 10*3/uL (ref 3.8–10.8)

## 2015-08-12 MED ORDER — APIXABAN 5 MG PO TABS: 5.0000 mg | ORAL_TABLET | Freq: Two times a day (BID) | ORAL | Status: DC

## 2015-08-12 NOTE — Patient Instructions (Signed)
Medication Instructions:  Your physician has recommended you make the following change in your medication:  1. Discontinue Asprin 2. Start Elquis ( 5 mg ) twice a day   Labwork: Your physician recommends that you have lab work today: cbc/mag    Testing/Procedures: Your physician has requested that you have an echocardiogram. Echocardiography is a painless test that uses sound waves to create images of your heart. It provides your doctor with information about the size and shape of your heart and how well your heart's chambers and valves are working. This procedure takes approximately one hour. There are no restrictions for this procedure.    Follow-Up: Your physician recommends that you keep your scheduled  follow-up appointment with Melina Copa, PA   Any Other Special Instructions Will Be Listed Below (If Applicable).     If you need a refill on your cardiac medications before your next appointment, please call your pharmacy.

## 2015-08-19 ENCOUNTER — Ambulatory Visit (HOSPITAL_COMMUNITY): Payer: Managed Care, Other (non HMO) | Attending: Internal Medicine

## 2015-08-19 ENCOUNTER — Other Ambulatory Visit: Payer: Self-pay

## 2015-08-19 DIAGNOSIS — I340 Nonrheumatic mitral (valve) insufficiency: Secondary | ICD-10-CM | POA: Insufficient documentation

## 2015-08-19 DIAGNOSIS — I4891 Unspecified atrial fibrillation: Secondary | ICD-10-CM | POA: Diagnosis present

## 2015-08-19 DIAGNOSIS — E785 Hyperlipidemia, unspecified: Secondary | ICD-10-CM | POA: Insufficient documentation

## 2015-08-19 DIAGNOSIS — I480 Paroxysmal atrial fibrillation: Secondary | ICD-10-CM | POA: Insufficient documentation

## 2015-08-19 DIAGNOSIS — I10 Essential (primary) hypertension: Secondary | ICD-10-CM

## 2015-08-19 DIAGNOSIS — I119 Hypertensive heart disease without heart failure: Secondary | ICD-10-CM | POA: Insufficient documentation

## 2015-08-19 LAB — ECHOCARDIOGRAM COMPLETE
Ao-asc: 36 cm
CHL CUP PV REG GRAD DIAS: 7 mmHg
FS: 24 % — AB (ref 28–44)
IVS/LV PW RATIO, ED: 0.99
LA ID, A-P, ES: 53 mm
LA diam index: 2.34 cm/m2
LA vol A4C: 85.3 ml
LA vol index: 38.6 mL/m2
LA vol: 87.3 mL
LEFT ATRIUM END SYS DIAM: 53 mm
LVOT VTI: 17.4 cm
LVOT area: 3.46 cm2
LVOT diameter: 21 mm
LVOT peak vel: 91.3 cm/s
LVOTSV: 60 mL
PV Reg vel dias: 131 cm/s
PW: 9.54 mm — AB (ref 0.6–1.1)
RV sys press: 35 mmHg
Reg peak vel: 258 cm/s
TRMAXVEL: 258 cm/s

## 2015-09-03 NOTE — Progress Notes (Addendum)
Cardiology Office Note    Date:  09/05/2015  ID:  Terry [name], DOB 09/26/1950, MRN [id number] PCP:   Melinda Crutch, MD  Cardiologist:  Nahser  Chief Complaint: f/u afib  History of Present Illness:  Terry [name] is a 65 y.o. male with history of atrial fibrillation (dx 06/2015), HTN, HLD, h/o syncope felt r/t orthostasis, pericarditis 2010, normal cors 2002, central retinal vein occlusion with possible mini-stroke at that time (age 8) s/p laser therapy who is here to follow up for atrial fib.  He had seen his PCP 07/10/15 at which time he noted sluggishness and was found to be in atrial fib. He was completely unaware that anything had changed - had been going to the gym regularly without adverse symptoms. He had felt occasional palpitation lying on his left side but nothing consistent. He denied any caffeine or alcohol intake. He did report taking a pre-workout pill supplement that was supposed to increase nitric oxide, and I recommended he stop this. Labs 07/16/15: glu 99, BUN 14, Cr 1.13, Na 142, K 3.6, Cl 104, CO2 31, albumin 3.9, AST 19, ALT 20, TSH 3.47, Tchol 225, Trig 149, HLD 42, LDL 154. F/u CBC 08/12/15 showed Hgb 17.1, normal Mg. 2D Echo 08/19/15: false tendon in LV apex of no clinical significance, EF 50-55%, no RWMA, mild LAE, PASP 34mmHg. CHADSVASC is definitely 1, possibly 3 if his history of central retinal vein occlusion did in fact represent embolic event. He was started on Eliquis 5mg  BID with consideration of cardioversion after 3-4 weeks of anticoagulation.   He returns for follow-up today still in atrial fib. He reports feeling a sense of dizziness in the mornings that wears off by lunchtime. He occasionally feels dizzy upon standing. He still feels generally sluggish. He and his wife report lots of stress at work. His employer is not very accomodating for medical appointments. Unfortunately he has been taking his Eliquis at noon and dinner he says because he was told by a nurse  that it needed to be taken with food. He denies any chest pain, pressure, or dyspnea - just a sense of "anxiety" in his chest. He has not been back to the gym due to car issues, but there have been no reliably reproducible symptoms with exertion. No syncope, LEE, orthopnea, or weight changes.   Past Medical History  Diagnosis Date  . Hyperlipidemia   . Pericarditis 2010  . Syncope     Hx of thought to be due to orthostatic hypotension  . ED (erectile dysfunction)   . Hypokalemia   . Leg edema   . HTN (hypertension)   . H/O cardiac catheterization 2002 - normal cors  . Central retinal vein occlusion   . Atrial fibrillation ([place])     a. Dx 06/2015.  . Mini stroke [facility])     a. ? 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 Cardiac catheterization  2002  . Nasal sinus surgery  2003    x2. Scar tissue build up and had to recreate his tear duct  . Knee surgery    . Hand surgery  2003  . Pericardiectomy      Current Medications: Current Outpatient Prescriptions  Medication Sig Dispense Refill  . apixaban (ELIQUIS) 5 MG TABS tablet Take 1 tablet (5 mg total) by mouth 2 (two) times daily. 60 tablet 9  . aspirin 81 MG tablet  Take 81 mg by mouth daily.    . cetirizine (ZYRTEC ALLERGY) 10 MG tablet Take 10 mg by mouth daily.    . fluticasone (FLONASE) 50 MCG/ACT nasal spray Place 2 sprays into both nostrils daily.    . irbesartan-hydrochlorothiazide (AVALIDE) 150-12.5 MG per tablet Take 1 tablet by mouth daily.     . Nebivolol HCl (BYSTOLIC) 20 MG TABS Take 20 mg by mouth daily.    [name] OVER THE COUNTER MEDICATION Take 3 capsules by mouth as needed (when working out). VOLCANO pre workout 1 mg Vitamin b 6 Nitric oxide 1520 mg L-citruline agmapre Agmatine sulfate L-norvaline Creatine strength complex    . rosuvastatin (CRESTOR) 5 MG tablet Take 5 mg by mouth daily.    . tadalafil (CIALIS) 20 MG tablet Take 1  tablet (20 mg total) by mouth daily as needed. 15 tablet 5   No current facility-administered medications for this visit.     Allergies:   Eggs or egg-derived products and Penicillins   Social History   Social History  . Marital Status: Married    Spouse Name: N/A  . Number of Children: N/A  . Years of Education: N/A   Social History Main Topics  . Smoking status: Never Smoker   . Smokeless tobacco: None  . Alcohol Use: Yes     Comment: 2-3 Times a Week Wine with Meals  . Drug Use: None  . Sexual Activity: Not Asked   Other Topics Concern  . None   Social History Narrative     Family History:  The patient's family history includes Atrial fibrillation in his father; Cancer in his mother; Diabetes in his father and mother; Heart disease in his father; Hypertension in his father and mother.   ROS:   Please see the history of present illness. Easy bruising since Eliquis but no overt bleeding.  All other systems are reviewed and otherwise negative.    PHYSICAL EXAM:   VS:  BP 140/96 mmHg  Pulse 77  Ht 6\' 1"  (1.854 m)  Wt 212 lb 12.8 oz (96.525 kg)  BMI 28.08 kg/m2  SpO2 98%  BMI: Body mass index is 28.08 kg/(m^2). GEN: Well nourished, well developed WM, in no acute distress HEENT: normocephalic, atraumatic Neck: no JVD, carotid bruits, or masses Cardiac: irregularly irregular; no murmurs, rubs, or gallops, no edema  Respiratory:  clear to auscultation bilaterally, normal work of breathing GI: soft, nontender, nondistended, + BS MS: no deformity or atrophy Skin: warm and dry, no rash Neuro:  Alert and Oriented x 3, Strength and sensation are intact, follows commands Psych: euthymic mood, full affect  Wt Readings from Last 3 Encounters:  09/05/15 212 lb 12.8 oz (96.525 kg)  08/12/15 215 lb 6.4 oz (97.705 kg)  12/26/12 211 lb (95.709 kg)      Studies/Labs Reviewed:   EKG:  EKG was ordered today and personally reviewed by me and demonstrates atrial fib 87bpm,  abberrantly conducted complexes, nonspecific ST-T changes  Recent Labs: 08/12/2015: Hemoglobin 17.1; Magnesium 2.1; Platelets 217   Lipid Panel    Component Value Date/Time   CHOL 173 02/22/2011 0914   TRIG 87.0 02/22/2011 0914   HDL 40.50 02/22/2011 0914   CHOLHDL 4 02/22/2011 0914   VLDL 17.4 02/22/2011 0914   LDLCALC 115* 02/22/2011 0914    Additional studies/ records that were reviewed today include: Summarized above.    ASSESSMENT & PLAN:   1. Persistent atrial fib - remains in atrial fib with vague symptoms of  dizziness/sluggishness. No overt CP or SOB. Unfortunately he's been taking his Eliquis about 6-7 hours apart at lunch and dinner. We reviewed the need for q12-hour dosing. I discussed with Dr. Angelena Form who agrees patient would need TEE before proceeding with DCCV. I reviewed the options at length with Terry [name] and his wife. I offered proceeding with TEE/DCCV soon versus 3 more weeks of uninterrupted Eliquis then followed by DCCV Given that he overall doesn't feel quite right in afib, he has elected to go ahead with TEE/DCCV in the next few days. Will recheck labs today in prep for this. Risks/benefits discussed with the patient who is agreeable to proceeding. We discussed possibly changing to Xarelto for once daily dosing but he would like to stay on Eliquis for now. If restoring NSR does not improve his generalized fatigue, would consider stress test to exclude underlying ischemia. He is not presently describing any anginal symptoms. Aspirin is back on his medicine list - will discontinue given concomitant Eliquis. 2. Hypertension - diastolic BP running higher in clinic today. He is describing some orthostasis-type dizziness when he stands. I will not make any changes today, but would consider BP med titration following outcome of TEE/DCCV. I have asked him to follow BP at home and let us know if running [id number] systolic or [id number] diastolic. 3. Hyperlipidemia - continue statin.  Followed by PCP.  Disposition: F/u with me or another APP in 2 weeks.  Medication Adjustments/Labs and Tests Ordered: Current medicines are reviewed at length with the patient today.  Concerns regarding medicines are outlined above. Medication changes, Labs and Tests ordered today are summarized above and listed in the Patient Instructions accessible in Encounters.   Raechel Ache PA-C  09/05/2015 8:39 AM    [address] [place], [place], [place]  [address] Phone: [phone]; Fax: [phone]

## 2015-09-05 ENCOUNTER — Other Ambulatory Visit: Payer: Self-pay | Admitting: Physician Assistant

## 2015-09-05 ENCOUNTER — Encounter: Payer: Self-pay | Admitting: *Deleted

## 2015-09-05 ENCOUNTER — Encounter: Payer: Self-pay | Admitting: Physician Assistant

## 2015-09-05 ENCOUNTER — Ambulatory Visit (INDEPENDENT_AMBULATORY_CARE_PROVIDER_SITE_OTHER): Payer: Managed Care, Other (non HMO) | Admitting: Physician Assistant

## 2015-09-05 VITALS — BP 140/96 | HR 77 | Ht 73.0 in | Wt 212.8 lb

## 2015-09-05 DIAGNOSIS — E785 Hyperlipidemia, unspecified: Secondary | ICD-10-CM

## 2015-09-05 DIAGNOSIS — I10 Essential (primary) hypertension: Secondary | ICD-10-CM

## 2015-09-05 DIAGNOSIS — I4819 Other persistent atrial fibrillation: Secondary | ICD-10-CM

## 2015-09-05 DIAGNOSIS — I481 Persistent atrial fibrillation: Secondary | ICD-10-CM

## 2015-09-05 LAB — CBC WITH DIFFERENTIAL/PLATELET
BASOS PCT: 1 %
Basophils Absolute: 63 cells/uL (ref 0–200)
EOS PCT: 2 %
Eosinophils Absolute: 126 cells/uL (ref 15–500)
HCT: 49.4 % (ref 38.5–50.0)
Hemoglobin: 17.1 g/dL (ref 13.2–17.1)
LYMPHS ABS: 1323 {cells}/uL (ref 850–3900)
Lymphocytes Relative: 21 %
MCH: 30.4 pg (ref 27.0–33.0)
MCHC: 34.6 g/dL (ref 32.0–36.0)
MCV: 87.7 fL (ref 80.0–100.0)
MONOS PCT: 8 %
MPV: 10.3 fL (ref 7.5–12.5)
Monocytes Absolute: 504 cells/uL (ref 200–950)
NEUTROS ABS: 4284 {cells}/uL (ref 1500–7800)
Neutrophils Relative %: 68 %
PLATELETS: 218 10*3/uL (ref 140–400)
RBC: 5.63 MIL/uL (ref 4.20–5.80)
RDW: 13.8 % (ref 11.0–15.0)
WBC: 6.3 10*3/uL (ref 3.8–10.8)

## 2015-09-05 LAB — TSH: TSH: 2.32 m[IU]/L (ref 0.40–4.50)

## 2015-09-05 LAB — BASIC METABOLIC PANEL
BUN: 17 mg/dL (ref 7–25)
CALCIUM: 9 mg/dL (ref 8.6–10.3)
CO2: 32 mmol/L — ABNORMAL HIGH (ref 20–31)
CREATININE: 1.25 mg/dL (ref 0.70–1.25)
Chloride: 103 mmol/L (ref 98–110)
Glucose, Bld: 96 mg/dL (ref 65–99)
Potassium: 3.6 mmol/L (ref 3.5–5.3)
Sodium: 142 mmol/L (ref 135–146)

## 2015-09-05 LAB — MAGNESIUM: MAGNESIUM: 2.1 mg/dL (ref 1.5–2.5)

## 2015-09-05 NOTE — Patient Instructions (Addendum)
Medication Instructions:  Your physician recommends that you continue on your current medications as directed. Please refer to the Current Medication list given to you today. MAKE SURE TO TAKE YOUR ELIQUIS 12 HOURS APART  MAKE SURE YOU STOP THE ASPIRIN  Labwork: TODAY:  BMET, CBC W/DIFF, PT/INR, & TSH  Testing/Procedures: Your physician has requested that you have a TEE/Cardioversion. During a TEE, sound waves are used to create images of your heart. It provides your doctor with information about the size and shape of your heart and how well your heart's chambers and valves are working. In this test, a transducer is attached to the end of a flexible tube that is guided down you throat and into your esophagus (the tube leading from your mouth to your stomach) to get a more detailed image of your heart. Once the TEE has determined that a blood clot is not present, the cardioversion begins. Electrical Cardioversion uses a jolt of electricity to your heart either through paddles or wired patches attached to your chest. This is a controlled, usually prescheduled, procedure. This procedure is done at the hospital and you are not awake during the procedure. You usually go home the day of the procedure. Please see the instruction sheet given to you today for more information.    Follow-Up: Your physician recommends that you schedule a follow-up appointment in: 2 WEEKS WITH APP    Any Other Special Instructions Will Be Listed Below (If Applicable).  Transesophageal Echocardiogram Transesophageal echocardiography (TEE) is a special type of test that produces images of the heart by using sound waves (echocardiogram). This type of echocardiography can obtain better images of the heart than standard echocardiography. TEE is done by passing a flexible tube down the esophagus. The heart is located in front of the esophagus. Because the heart and esophagus are close to one another, your health care provider can  take very clear, detailed pictures of the heart via ultrasound waves. TEE may be done:  If your health care provider needs more information based on standard echocardiography findings.  If you had a stroke. This might have happened because a clot formed in your heart. TEE can visualize different areas of the heart and check for clots.  To check valve anatomy and function.  To check for infection on the inside of your heart (endocarditis).  To evaluate the dividing wall (septum) of the heart and presence of a hole that did not close after birth (patent foramen ovale or atrial septal defect).  To help diagnose a tear in the wall of the aorta (aortic dissection).  During cardiac valve surgery. This allows the surgeon to assess the valve repair before closing the chest.  During a variety of other cardiac procedures to guide positioning of catheters.  Sometimes before a cardioversion, which is a shock to convert heart rhythm back to normal. LET [facility] CARE PROVIDER KNOW ABOUT:   Any allergies you have.  All medicines you are taking, including vitamins, herbs, eye drops, creams, and over-the-counter medicines.  Previous problems you or members of your family have had with the use of anesthetics.  Any blood disorders you have.  Previous surgeries you have had.  Medical conditions you have.  Swallowing difficulties.  An esophageal obstruction. RISKS AND COMPLICATIONS  Generally, TEE is a safe procedure. However, as with any procedure, complications can occur. Possible complications include an esophageal tear (rupture). BEFORE THE PROCEDURE   Do not eat or drink for 6 hours before the procedure or  as directed by your health care provider.  Arrange for someone to drive you home after the procedure. Do not drive yourself home. During the procedure, you will be given medicines that can continue to make you feel drowsy and can impair your reflexes.  An IV access tube will be  started in the arm. PROCEDURE   A medicine to help you relax (sedative) will be given through the IV access tube.  A medicine may be sprayed or gargled to numb the back of the throat.  Your blood pressure, heart rate, and breathing (vital signs) will be monitored during the procedure.  The TEE probe is a long, flexible tube. The tip of the probe is placed into the back of the mouth, and you will be asked to swallow. This helps to pass the tip of the probe into the esophagus. Once the tip of the probe is in the correct area, your health care provider can take pictures of the heart.  TEE is usually not a painful procedure. You may feel the probe press against the back of the throat. The probe does not enter the trachea and does not affect your breathing. AFTER THE PROCEDURE   You will be in bed, resting, until you have fully returned to consciousness.  When you first awaken, your throat may feel slightly sore and will probably still feel numb. This will improve slowly over time.  You will not be allowed to eat or drink until it is clear that the numbness has improved.  Once you have been able to drink, urinate, and sit on the edge of the bed without feeling sick to your stomach (nausea) or dizzy, you may be cleared to go home.  You should have a friend or family member with you for the next 24 hours after your procedure.   This information is not intended to replace advice given to you by your health care provider. Make sure you discuss any questions you have with your health care provider.   Document Released: 05/22/2002 Document Revised: 03/06/2013 Document Reviewed: 08/31/2012 Elsevier Interactive Patient Education 2016 [employer].  Hospital doctor cardioversion is the delivery of a jolt of electricity to change the rhythm of the heart. Sticky patches or metal paddles are placed on the chest to deliver the electricity from a device. This is done to restore a  normal rhythm. A rhythm that is too fast or not regular keeps the heart from pumping well. Electrical cardioversion is done in an emergency if:   There is low or no blood pressure as a result of the heart rhythm.   Normal rhythm must be restored as fast as possible to protect the brain and heart from further damage.   It may save a life. Cardioversion may be done for heart rhythms that are not immediately life threatening, such as atrial fibrillation or flutter, in which:   The heart is beating too fast or is not regular.   Medicine to change the rhythm has not worked.   It is safe to wait in order to allow time for preparation.  Symptoms of the abnormal rhythm are bothersome.  The risk of stroke and other serious problems can be reduced. LET [facility] CARE PROVIDER KNOW ABOUT:   Any allergies you have.  All medicines you are taking, including vitamins, herbs, eye drops, creams, and over-the-counter medicines.  Previous problems you or members of your family have had with the use of anesthetics.   Any blood disorders you  have.   Previous surgeries you have had.   Medical conditions you have. RISKS AND COMPLICATIONS  Generally, this is a safe procedure. However, problems can occur and include:   Breathing problems related to the anesthetic used.  A blood clot that breaks free and travels to other parts of your body. This could cause a stroke or other problems. The risk of this is lowered by use of blood-thinning medicine (anticoagulant) prior to the procedure.  Cardiac arrest (rare). BEFORE THE PROCEDURE   You may have tests to detect blood clots in your heart and to evaluate heart function.  You may start taking anticoagulants so your blood does not clot as easily.   Medicines may be given to help stabilize your heart rate and rhythm. PROCEDURE  You will be given medicine through an IV tube to reduce discomfort and make you sleepy (sedative).   An  electrical shock will be delivered. AFTER THE PROCEDURE Your heart rhythm will be watched to make sure it does not change.    This information is not intended to replace advice given to you by your health care provider. Make sure you discuss any questions you have with your health care provider.   Document Released: 02/19/2002 Document Revised: 03/22/2014 Document Reviewed: 09/13/2012 Elsevier Interactive Patient Education [employer].   If you need a refill on your cardiac medications before your next appointment, please call your pharmacy.

## 2015-09-08 ENCOUNTER — Encounter (HOSPITAL_COMMUNITY): Payer: Self-pay | Admitting: Certified Registered Nurse Anesthetist

## 2015-09-08 ENCOUNTER — Ambulatory Visit (HOSPITAL_COMMUNITY): Payer: Managed Care, Other (non HMO) | Admitting: Certified Registered Nurse Anesthetist

## 2015-09-08 ENCOUNTER — Encounter (HOSPITAL_COMMUNITY)
Admission: RE | Disposition: A | Discharge status: Home / Self Care | Payer: Self-pay | Source: Ambulatory Visit | Attending: Internal Medicine

## 2015-09-08 ENCOUNTER — Ambulatory Visit (HOSPITAL_COMMUNITY)
Admission: RE | Admit: 2015-09-08 | Discharge: 2015-09-08 | Disposition: A | Discharge status: Home / Self Care | Payer: Managed Care, Other (non HMO) | Source: Ambulatory Visit | Attending: Internal Medicine | Admitting: Internal Medicine

## 2015-09-08 ENCOUNTER — Ambulatory Visit (HOSPITAL_BASED_OUTPATIENT_CLINIC_OR_DEPARTMENT_OTHER)
Admission: RE | Admit: 2015-09-08 | Discharge: 2015-09-08 | Disposition: A | Discharge status: Home / Self Care | Payer: Managed Care, Other (non HMO) | Source: Ambulatory Visit | Attending: Physician Assistant | Admitting: Physician Assistant

## 2015-09-08 DIAGNOSIS — I10 Essential (primary) hypertension: Secondary | ICD-10-CM | POA: Insufficient documentation

## 2015-09-08 DIAGNOSIS — Z7901 Long term (current) use of anticoagulants: Secondary | ICD-10-CM | POA: Insufficient documentation

## 2015-09-08 DIAGNOSIS — I4891 Unspecified atrial fibrillation: Secondary | ICD-10-CM

## 2015-09-08 DIAGNOSIS — Z79899 Other long term (current) drug therapy: Secondary | ICD-10-CM | POA: Diagnosis not present

## 2015-09-08 DIAGNOSIS — I481 Persistent atrial fibrillation: Secondary | ICD-10-CM

## 2015-09-08 DIAGNOSIS — Z7951 Long term (current) use of inhaled steroids: Secondary | ICD-10-CM | POA: Insufficient documentation

## 2015-09-08 DIAGNOSIS — Z8673 Personal history of transient ischemic attack (TIA), and cerebral infarction without residual deficits: Secondary | ICD-10-CM | POA: Insufficient documentation

## 2015-09-08 DIAGNOSIS — Z7982 Long term (current) use of aspirin: Secondary | ICD-10-CM | POA: Diagnosis not present

## 2015-09-08 DIAGNOSIS — I4819 Other persistent atrial fibrillation: Secondary | ICD-10-CM | POA: Insufficient documentation

## 2015-09-08 DIAGNOSIS — E785 Hyperlipidemia, unspecified: Secondary | ICD-10-CM | POA: Diagnosis not present

## 2015-09-08 HISTORY — PX: CARDIOVERSION: SHX1299

## 2015-09-08 HISTORY — PX: TEE WITHOUT CARDIOVERSION: SHX5443

## 2015-09-08 SURGERY — CARDIOVERSION
Anesthesia: Monitor Anesthesia Care

## 2015-09-08 MED ORDER — LIDOCAINE HCL (CARDIAC) 20 MG/ML IV SOLN
INTRAVENOUS | Status: DC | PRN
  Administered 2015-09-08: 100 mg via INTRATRACHEAL

## 2015-09-08 MED ORDER — HYDROCORTISONE 1 % EX CREA: 1.0000 "application " | TOPICAL_CREAM | Freq: Three times a day (TID) | CUTANEOUS | Status: DC | PRN

## 2015-09-08 MED ORDER — LACTATED RINGERS IV SOLN
INTRAVENOUS | Status: DC
  Administered 2015-09-08: 09:00:00 via INTRAVENOUS

## 2015-09-08 MED ORDER — PERFLUTREN LIPID MICROSPHERE
INTRAVENOUS | Status: AC
  Filled 2015-09-08: qty 10

## 2015-09-08 MED ORDER — LIDOCAINE VISCOUS 2 % MT SOLN
OROMUCOSAL | Status: AC
  Filled 2015-09-08: qty 15

## 2015-09-08 MED ORDER — PROPOFOL 500 MG/50ML IV EMUL
INTRAVENOUS | Status: DC | PRN
Start: 2015-09-08 — End: 2015-09-08
  Administered 2015-09-08: 60 ug/kg/min via INTRAVENOUS

## 2015-09-08 MED ORDER — LIDOCAINE VISCOUS 2 % MT SOLN
OROMUCOSAL | Status: DC | PRN
  Administered 2015-09-08: 1 via OROMUCOSAL

## 2015-09-08 MED ORDER — SODIUM CHLORIDE 0.9 % IV SOLN: INTRAVENOUS | Status: DC

## 2015-09-08 MED ORDER — PROPOFOL 10 MG/ML IV BOLUS
INTRAVENOUS | Status: DC | PRN
  Administered 2015-09-08 (×2): 15 mg via INTRAVENOUS

## 2015-09-08 NOTE — H&P (View-Only) (Signed)
Cardiology Office Note    Date:  09/05/2015  ID:  Terry [name], DOB Feb 15, 1951, MRN [id number] PCP:   Melinda Crutch, MD  Cardiologist:  Nahser  Chief Complaint: f/u afib  History of Present Illness:  Terry [name] is a 65 y.o. male with history of atrial fibrillation (dx 06/2015), HTN, HLD, h/o syncope felt r/t orthostasis, pericarditis 2010, normal cors 2002, central retinal vein occlusion with possible mini-stroke at that time (age 41) s/p laser therapy who is here to follow up for atrial fib.  He had seen his PCP 07/10/15 at which time he noted sluggishness and was found to be in atrial fib. He was completely unaware that anything had changed - had been going to the gym regularly without adverse symptoms. He had felt occasional palpitation lying on his left side but nothing consistent. He denied any caffeine or alcohol intake. He did report taking a pre-workout pill supplement that was supposed to increase nitric oxide, and I recommended he stop this. Labs 07/16/15: glu 99, BUN 14, Cr 1.13, Na 142, K 3.6, Cl 104, CO2 31, albumin 3.9, AST 19, ALT 20, TSH 3.47, Tchol 225, Trig 149, HLD 42, LDL 154. F/u CBC 08/12/15 showed Hgb 17.1, normal Mg. 2D Echo 08/19/15: false tendon in LV apex of no clinical significance, EF 50-55%, no RWMA, mild LAE, PASP 57mmHg. CHADSVASC is definitely 1, possibly 3 if his history of central retinal vein occlusion did in fact represent embolic event. He was started on Eliquis 5mg  BID with consideration of cardioversion after 3-4 weeks of anticoagulation.   He returns for follow-up today still in atrial fib. He reports feeling a sense of dizziness in the mornings that wears off by lunchtime. He occasionally feels dizzy upon standing. He still feels generally sluggish. He and his wife report lots of stress at work. His employer is not very accomodating for medical appointments. Unfortunately he has been taking his Eliquis at noon and dinner he says because he was told by a nurse  that it needed to be taken with food. He denies any chest pain, pressure, or dyspnea - just a sense of "anxiety" in his chest. He has not been back to the gym due to car issues, but there have been no reliably reproducible symptoms with exertion. No syncope, LEE, orthopnea, or weight changes.   Past Medical History  Diagnosis Date  . Hyperlipidemia   . Pericarditis 2010  . Syncope     Hx of thought to be due to orthostatic hypotension  . ED (erectile dysfunction)   . Hypokalemia   . Leg edema   . HTN (hypertension)   . H/O cardiac catheterization 2002 - normal cors  . Central retinal vein occlusion   . Atrial fibrillation ([place])     a. Dx 06/2015.  . Mini stroke [facility])     a. ? 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 Cardiac catheterization  2002  . Nasal sinus surgery  2003    x2. Scar tissue build up and had to recreate his tear duct  . Knee surgery    . Hand surgery  2003  . Pericardiectomy      Current Medications: Current Outpatient Prescriptions  Medication Sig Dispense Refill  . apixaban (ELIQUIS) 5 MG TABS tablet Take 1 tablet (5 mg total) by mouth 2 (two) times daily. 60 tablet 9  . aspirin 81 MG tablet  Take 81 mg by mouth daily.    . cetirizine (ZYRTEC ALLERGY) 10 MG tablet Take 10 mg by mouth daily.    . fluticasone (FLONASE) 50 MCG/ACT nasal spray Place 2 sprays into both nostrils daily.    . irbesartan-hydrochlorothiazide (AVALIDE) 150-12.5 MG per tablet Take 1 tablet by mouth daily.     . Nebivolol HCl (BYSTOLIC) 20 MG TABS Take 20 mg by mouth daily.    [name] OVER THE COUNTER MEDICATION Take 3 capsules by mouth as needed (when working out). VOLCANO pre workout 1 mg Vitamin b 6 Nitric oxide 1520 mg L-citruline agmapre Agmatine sulfate L-norvaline Creatine strength complex    . rosuvastatin (CRESTOR) 5 MG tablet Take 5 mg by mouth daily.    . tadalafil (CIALIS) 20 MG tablet Take 1  tablet (20 mg total) by mouth daily as needed. 15 tablet 5   No current facility-administered medications for this visit.     Allergies:   Eggs or egg-derived products and Penicillins   Social History   Social History  . Marital Status: Married    Spouse Name: N/A  . Number of Children: N/A  . Years of Education: N/A   Social History Main Topics  . Smoking status: Never Smoker   . Smokeless tobacco: None  . Alcohol Use: Yes     Comment: 2-3 Times a Week Wine with Meals  . Drug Use: None  . Sexual Activity: Not Asked   Other Topics Concern  . None   Social History Narrative     Family History:  The patient's family history includes Atrial fibrillation in his father; Cancer in his mother; Diabetes in his father and mother; Heart disease in his father; Hypertension in his father and mother.   ROS:   Please see the history of present illness. Easy bruising since Eliquis but no overt bleeding.  All other systems are reviewed and otherwise negative.    PHYSICAL EXAM:   VS:  BP 140/96 mmHg  Pulse 77  Ht 6\' 1"  (1.854 m)  Wt 212 lb 12.8 oz (96.525 kg)  BMI 28.08 kg/m2  SpO2 98%  BMI: Body mass index is 28.08 kg/(m^2). GEN: Well nourished, well developed WM, in no acute distress HEENT: normocephalic, atraumatic Neck: no JVD, carotid bruits, or masses Cardiac: irregularly irregular; no murmurs, rubs, or gallops, no edema  Respiratory:  clear to auscultation bilaterally, normal work of breathing GI: soft, nontender, nondistended, + BS MS: no deformity or atrophy Skin: warm and dry, no rash Neuro:  Alert and Oriented x 3, Strength and sensation are intact, follows commands Psych: euthymic mood, full affect  Wt Readings from Last 3 Encounters:  09/05/15 212 lb 12.8 oz (96.525 kg)  08/12/15 215 lb 6.4 oz (97.705 kg)  12/26/12 211 lb (95.709 kg)      Studies/Labs Reviewed:   EKG:  EKG was ordered today and personally reviewed by me and demonstrates atrial fib 87bpm,  abberrantly conducted complexes, nonspecific ST-T changes  Recent Labs: 08/12/2015: Hemoglobin 17.1; Magnesium 2.1; Platelets 217   Lipid Panel    Component Value Date/Time   CHOL 173 02/22/2011 0914   TRIG 87.0 02/22/2011 0914   HDL 40.50 02/22/2011 0914   CHOLHDL 4 02/22/2011 0914   VLDL 17.4 02/22/2011 0914   LDLCALC 115* 02/22/2011 0914    Additional studies/ records that were reviewed today include: Summarized above.    ASSESSMENT & PLAN:   1. Persistent atrial fib - remains in atrial fib with vague symptoms of  dizziness/sluggishness. No overt CP or SOB. Unfortunately he's been taking his Eliquis about 6-7 hours apart at lunch and dinner. We reviewed the need for q12-hour dosing. I discussed with Dr. Angelena Form who agrees patient would need TEE before proceeding with DCCV. I reviewed the options at length with Mr. Emrick and his wife. I offered proceeding with TEE/DCCV soon versus 3 more weeks of uninterrupted Eliquis then followed by DCCV Given that he overall doesn't feel quite right in afib, he has elected to go ahead with TEE/DCCV in the next few days. Will recheck labs today in prep for this. Risks/benefits discussed with the patient who is agreeable to proceeding. We discussed possibly changing to Xarelto for once daily dosing but he would like to stay on Eliquis for now. If restoring NSR does not improve his generalized fatigue, would consider stress test to exclude underlying ischemia. He is not presently describing any anginal symptoms. Aspirin is back on his medicine list - will discontinue given concomitant Eliquis. 2. Hypertension - diastolic BP running higher in clinic today. He is describing some orthostasis-type dizziness when he stands. I will not make any changes today, but would consider BP med titration following outcome of TEE/DCCV. I have asked him to follow BP at home and let us know if running [id number] systolic or [id number] diastolic. 3. Hyperlipidemia - continue statin.  Followed by PCP.  Disposition: F/u with me or another APP in 2 weeks.  Medication Adjustments/Labs and Tests Ordered: Current medicines are reviewed at length with the patient today.  Concerns regarding medicines are outlined above. Medication changes, Labs and Tests ordered today are summarized above and listed in the Patient Instructions accessible in Encounters.   Raechel Ache PA-C  09/05/2015 8:39 AM    [address], [place], [place]  [address] Phone: [phone]; Fax: [phone]

## 2015-09-08 NOTE — Transfer of Care (Signed)
Immediate Anesthesia Transfer of Care Note  Patient: Terry [name]  Procedure(s) Performed: Procedure(s): CARDIOVERSION (N/A) TRANSESOPHAGEAL ECHOCARDIOGRAM (TEE) (N/A)  Patient Location: Endoscopy Unit  Anesthesia Type:MAC  Level of Consciousness: awake, alert  and patient cooperative  Airway & Oxygen Therapy: Patient Spontanous Breathing and Patient connected to nasal cannula oxygen  Post-op Assessment: Report given to RN and Post -op Vital signs reviewed and stable  Post vital signs: Reviewed and stable  Last Vitals:  Filed Vitals:   09/08/15 0857 09/08/15 1041  BP: 153/99 113/93  Pulse: 76 46  Temp: 36.6 C 36.4 C  Resp: 18 11    Last Pain: There were no vitals filed for this visit.       Complications: No apparent anesthesia complications

## 2015-09-08 NOTE — Progress Notes (Signed)
  Echocardiogram Echocardiogram Transesophageal has been performed.  Terry [name] 09/08/2015, 11:08 AM

## 2015-09-08 NOTE — Anesthesia Postprocedure Evaluation (Signed)
Anesthesia Post Note  Patient: Terry [name]  Procedure(s) Performed: Procedure(s) (LRB): CARDIOVERSION (N/A) TRANSESOPHAGEAL ECHOCARDIOGRAM (TEE) (N/A)  Patient location during evaluation: PACU Anesthesia Type: MAC Level of consciousness: awake and alert Pain management: pain level controlled Vital Signs Assessment: post-procedure vital signs reviewed and stable Respiratory status: spontaneous breathing, nonlabored ventilation, respiratory function stable and patient connected to nasal cannula oxygen Cardiovascular status: blood pressure returned to baseline and stable Postop Assessment: no signs of nausea or vomiting Anesthetic complications: no    Last Vitals:  Filed Vitals:   09/08/15 1110 09/08/15 1116  BP: 143/105 152/109  Pulse: 66 52  Temp:    Resp: 10 9    Last Pain: There were no vitals filed for this visit.               Adonis Ryther L

## 2015-09-08 NOTE — Anesthesia Procedure Notes (Signed)
Procedure Name: MAC Date/Time: 09/08/2015 10:01 AM Performed by: Salli Quarry Kamil Mchaffie Pre-anesthesia Checklist: Emergency Drugs available, Patient identified, Suction available and Patient being monitored Patient Re-evaluated:Patient Re-evaluated prior to inductionOxygen Delivery Method: Nasal cannula

## 2015-09-08 NOTE — Interval H&P Note (Signed)
History and Physical Interval Note:  09/08/2015 9:37 AM  [name]  has presented today for surgery, with the diagnosis of afib  The various methods of treatment have been discussed with the patient and family. After consideration of risks, benefits and other options for treatment, the patient has consented to  Procedure(s): CARDIOVERSION (N/A) TRANSESOPHAGEAL ECHOCARDIOGRAM (TEE) (N/A) as a surgical intervention .  The patient's history has been reviewed, patient examined, no change in status, stable for surgery.  I have reviewed the patient's chart and labs.  Questions were answered to the patient's satisfaction.     [name]

## 2015-09-08 NOTE — Discharge Instructions (Signed)
Electrical Cardioversion, Care After °Refer to this sheet in the next few weeks. These instructions provide you with information on caring for yourself after your procedure. Your health care provider may also give you more specific instructions. Your treatment has been planned according to current medical practices, but problems sometimes occur. Call your health care provider if you have any problems or questions after your procedure. °WHAT TO EXPECT AFTER THE PROCEDURE °After your procedure, it is typical to have the following sensations: °· Some redness on the skin where the shocks were delivered. If this is tender, a sunburn lotion or hydrocortisone cream may help. °· Possible return of an abnormal heart rhythm within hours or days after the procedure. °HOME CARE INSTRUCTIONS °· Take medicines only as directed by your health care provider. Be sure you understand how and when to take your medicine. °· Learn how to feel your pulse and check it often. °· Limit your activity for 48 hours after the procedure or as directed by your health care provider. °· Avoid or minimize caffeine and other stimulants as directed by your health care provider. °SEEK MEDICAL CARE IF: °· You feel like your heart is beating too fast or your pulse is not regular. °· You have any questions about your medicines. °· You have bleeding that will not stop. °SEEK IMMEDIATE MEDICAL CARE IF: °· You are dizzy or feel faint. °· It is hard to breathe or you feel short of breath. °· There is a change in discomfort in your chest. °· Your speech is slurred or you have trouble moving an arm or leg on one side of your body. °· You get a serious muscle cramp that does not go away. °· Your fingers or toes turn cold or blue. °  °This information is not intended to replace advice given to you by your health care provider. Make sure you discuss any questions you have with your health care provider. °  °Document Released: 12/20/2012 Document Revised: 03/22/2014  Document Reviewed: 12/20/2012 °Elsevier Interactive Patient Education ©2016 [employer] °Transesophageal Echocardiogram °Transesophageal echocardiography (TEE) is a picture test of your heart using sound waves. The pictures taken can give very detailed pictures of your heart. This can help your doctor see if there are problems with your heart. TEE can check: °· If your heart has blood clots in it. °· How well your heart valves are working. °· If you have an infection on the inside of your heart. °· Some of the major arteries of your heart. °· If your heart valve is working after a repair. °· Your heart before a procedure that uses a shock to your heart to get the rhythm back to normal. °BEFORE THE PROCEDURE °· Do not eat or drink for 6 hours before the procedure or as told by your doctor. °· Make plans to have someone drive you home after the procedure. Do not drive yourself home. °· An IV tube will be put in your arm. °PROCEDURE °· You will be given a medicine to help you relax (sedative). It will be given through the IV tube. °· A numbing medicine will be sprayed or gargled in the back of your throat to help numb it. °· The tip of the probe is placed into the back of your mouth. You will be asked to swallow. This helps to pass the probe into your esophagus. °· Once the tip of the probe is in the right place, your doctor can take pictures of your heart. °· You   may feel pressure at the back of your throat. °AFTER THE PROCEDURE °· You will be taken to a recovery area so the sedative can wear off. °· Your throat may be sore and scratchy. This will go away slowly over time. °· You will go home when you are fully awake and able to swallow liquids. °· You should have someone stay with you for the next 24 hours. °· Do not drive or operate machinery for the next 24 hours. °  °This information is not intended to replace advice given to you by your health care provider. Make sure you discuss any questions you have with  your health care provider. °  °Document Released: 12/27/2008 Document Revised: 03/06/2013 Document Reviewed: 08/31/2012 °Elsevier Interactive Patient Education ©2016 [employer] ° °

## 2015-09-08 NOTE — Op Note (Signed)
LA appears large with some spontaneous contrast swirling  The LA appendage is large  There is no definite thrombus,  RA without thrombuse TV normal  Trace TR AV mildly thickened  No AI MV is mildly thickened  Trace MR PV normal  Mild PI LVEF appears mildly depressed   Mild fixed plaquing of hte thoracic aorta.

## 2015-09-08 NOTE — Op Note (Signed)
Patient anesthetized by anesthesia with Propofol With pads in AP position, pt cardioverted to SR with 200 J synchronized biphasic energy   Procedure was without complication.

## 2015-09-08 NOTE — Anesthesia Preprocedure Evaluation (Addendum)
Anesthesia Evaluation  Patient identified by MRN, date of birth, ID band Patient awake    Reviewed: Allergy & Precautions, H&P , NPO status , Patient's Chart, lab work & pertinent test results, reviewed documented beta blocker date and time   Airway Mallampati: II  TM Distance: >3 FB Neck ROM: full    Dental no notable dental hx. 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 CVA negative neurological ROS  negative psych ROS   GI/Hepatic negative GI ROS, Neg liver ROS,   Endo/Other  negative endocrine ROS  Renal/GU negative Renal ROS  negative genitourinary   Musculoskeletal   Abdominal   Peds  Hematology negative hematology ROS (+)   Anesthesia Other Findings   Reproductive/Obstetrics negative OB ROS                            Anesthesia Physical Anesthesia Plan  ASA: III  Anesthesia Plan: MAC   Post-op Pain Management:    Induction:   Airway Management Planned:   Additional Equipment:   Intra-op Plan:   Post-operative Plan:   Informed Consent: I have reviewed the patients History and Physical, chart, labs and discussed the procedure including the risks, benefits and alternatives for the proposed anesthesia with the patient or authorized representative who has indicated his/her understanding and acceptance.   Dental Advisory Given  Plan Discussed with: CRNA  Anesthesia Plan Comments:         Anesthesia Quick Evaluation

## 2015-09-09 ENCOUNTER — Encounter (HOSPITAL_COMMUNITY): Payer: Self-pay | Admitting: Internal Medicine

## 2015-09-12 ENCOUNTER — Telehealth: Payer: Self-pay | Admitting: Cardiovascular Disease

## 2015-09-12 MED ORDER — POTASSIUM CHLORIDE CRYS ER 20 MEQ PO TBCR: 20.0000 meq | EXTENDED_RELEASE_TABLET | Freq: Every day | ORAL | Status: DC

## 2015-09-12 NOTE — Telephone Encounter (Signed)
Per pt message, left detailed message on vmail. Added potassium 20 meq daily. Order sent to pt pharmacy on file.

## 2015-09-12 NOTE — Telephone Encounter (Signed)
Follow Up:   Returning call,thinks it might be about his lab results. If he does not answer,please leave his results on his voicemail please.

## 2015-09-22 ENCOUNTER — Encounter: Payer: Self-pay | Admitting: Physician Assistant

## 2015-09-22 ENCOUNTER — Ambulatory Visit (INDEPENDENT_AMBULATORY_CARE_PROVIDER_SITE_OTHER): Payer: Managed Care, Other (non HMO) | Admitting: Physician Assistant

## 2015-09-22 VITALS — BP 140/100 | HR 54 | Ht 73.0 in | Wt 214.8 lb

## 2015-09-22 DIAGNOSIS — I10 Essential (primary) hypertension: Secondary | ICD-10-CM

## 2015-09-22 DIAGNOSIS — I519 Heart disease, unspecified: Secondary | ICD-10-CM

## 2015-09-22 DIAGNOSIS — I481 Persistent atrial fibrillation: Secondary | ICD-10-CM

## 2015-09-22 DIAGNOSIS — E785 Hyperlipidemia, unspecified: Secondary | ICD-10-CM

## 2015-09-22 DIAGNOSIS — R0683 Snoring: Secondary | ICD-10-CM | POA: Diagnosis not present

## 2015-09-22 DIAGNOSIS — I4819 Other persistent atrial fibrillation: Secondary | ICD-10-CM

## 2015-09-22 MED ORDER — IRBESARTAN-HYDROCHLOROTHIAZIDE 300-12.5 MG PO TABS: 1.0000 | ORAL_TABLET | Freq: Every day | ORAL | Status: DC

## 2015-09-22 NOTE — Progress Notes (Signed)
Cardiology Office Note:    Date:  09/22/2015   ID:  Terry [name], DOB 1950-11-02, MRN [id number]  PCP:   Melinda Crutch, MD  Cardiologist:  Dr. Liam Rogers   Electrophysiologist:  n/a  Referring MD: Lona Kettle, MD   Chief Complaint  Patient presents with  . Atrial Fibrillation    s/p TEE-DCCV    History of Present Illness:     Terry [name] is a 66 y.o. male with a hx of AFib, HTN, HL, pericarditis in 2010, possible TIA (central retinal vein occlusion at age 54 status post laser therapy). Atrial fibrillation was diagnosed in 4/17. He was placed on Eliquis. Last seen in this office 09/05/15 by Melina Copa, PA-C. He remained in atrial fibrillation. He was somewhat symptomatic and TEE-DCCV was recommended. Procedure was performed 09/08/15. He returns for follow-up.   CHADS2-VASc=3 (HTN, ?TIA).  Here with his wife.  He has a lot of questions about his TEE-DCCV and his BP.  I tried to answer all of them today.  He denies chest pain, dyspnea, syncope, edema, PND, orthopnea, bleeding issues. He has felt much better since his DCCV.  He did not realize how fatigued he was.  His wife notes a hx of snoring.    Past Medical History  Diagnosis Date  . Hyperlipidemia   . Pericarditis 2010  . Syncope     Hx of thought to be due to orthostatic hypotension  . ED (erectile dysfunction)   . Hypokalemia   . Leg edema   . HTN (hypertension)   . H/O cardiac catheterization 2002 - normal cors  . Central retinal vein occlusion   . Atrial fibrillation ([place])     a. Dx 06/2015.  . Mini stroke [facility])     a. ? Age 53 at time of central retinal vein occlusion - lost vision in eye    Past Surgical History  Procedure Laterality Date  . Cardiac catheterization  2002  . Nasal sinus surgery  2003    x2. Scar tissue build up and had to recreate his tear duct  . Knee surgery    . Hand surgery  2003  . Pericardiectomy    . Cardioversion N/A 09/08/2015    Procedure: CARDIOVERSION;  Surgeon: Fay Records,  MD;  Location: [facility] ENDOSCOPY;  Service: Cardiovascular;  Laterality: N/A;  . Tee without cardioversion N/A 09/08/2015    Procedure: TRANSESOPHAGEAL ECHOCARDIOGRAM (TEE);  Surgeon: Fay Records, MD;  Location: [facility] ENDOSCOPY;  Service: Cardiovascular;  Laterality: N/A;    Current Medications: Outpatient Prescriptions Prior to Visit  Medication Sig Dispense Refill  . apixaban (ELIQUIS) 5 MG TABS tablet Take 1 tablet (5 mg total) by mouth 2 (two) times daily. 60 tablet 9  . cetirizine (ZYRTEC ALLERGY) 10 MG tablet Take 10 mg by mouth daily.    . fluticasone (FLONASE) 50 MCG/ACT nasal spray Place 2 sprays into both nostrils daily.    . hydrocortisone cream 1 % Apply 1 application topically 3 (three) times daily as needed for itching (skin irritation). 30 g 0  . Nebivolol HCl (BYSTOLIC) 20 MG TABS Take 20 mg by mouth daily.    [name] OVER THE COUNTER MEDICATION Take 3 capsules by mouth as needed (when working out). VOLCANO pre workout 1 mg Vitamin b 6 Nitric oxide 1520 mg L-citruline agmapre Agmatine sulfate L-norvaline Creatine strength complex    . potassium chloride SA (K-DUR,KLOR-CON) 20 MEQ tablet Take 1 tablet (20 mEq total) by mouth daily. 90 tablet 3  .  rosuvastatin (CRESTOR) 5 MG tablet Take 5 mg by mouth daily.    . tadalafil (CIALIS) 20 MG tablet Take 1 tablet (20 mg total) by mouth daily as needed. 15 tablet 5  . irbesartan-hydrochlorothiazide (AVALIDE) 150-12.5 MG per tablet Take 1 tablet by mouth daily.      No facility-administered medications prior to visit.      Allergies:   Eggs or egg-derived products and Penicillins   Social History   Social History  . Marital Status: Married    Spouse Name: N/A  . Number of Children: N/A  . Years of Education: N/A   Social History Main Topics  . Smoking status: Never Smoker   . Smokeless tobacco: None  . Alcohol Use: Yes     Comment: 2-3 Times a Week Wine with Meals  . Drug Use: None  . Sexual Activity: Not Asked   Other Topics  Concern  . None   Social History Narrative     Family History:  The patient's family history includes Atrial fibrillation in his father; Cancer in his mother; Diabetes in his father and mother; Heart disease in his father; Hypertension in his father and mother.   ROS:   Please see the history of present illness.    Review of Systems  Cardiovascular: Positive for irregular heartbeat.  Neurological: Positive for light-headedness.   All other systems reviewed and are negative.   Physical Exam:    VS:  BP 140/100 mmHg  Pulse 54  Ht 6\' 1"  (1.854 m)  Wt 214 lb 12.8 oz (97.433 kg)  BMI 28.35 kg/m2   Physical Exam  Constitutional: He is oriented to person, place, and time. He appears well-developed and well-nourished.  HENT:  Head: Normocephalic and atraumatic.  Neck: No JVD present.  Cardiovascular: Normal rate and normal heart sounds.   No murmur heard. Pulmonary/Chest: Effort normal and breath sounds normal. He has no wheezes. He has no rales.  Abdominal: Soft. There is no tenderness.  Musculoskeletal: He exhibits no edema.  Neurological: He is alert and oriented to person, place, and time.  Skin: Skin is warm and dry.  Psychiatric: He has a normal mood and affect.    Wt Readings from Last 3 Encounters:  09/22/15 214 lb 12.8 oz (97.433 kg)  09/05/15 212 lb 12.8 oz (96.525 kg)  08/12/15 215 lb 6.4 oz (97.705 kg)      Studies/Labs Reviewed:     EKG:  EKG is  ordered today.  The ekg ordered today demonstrates sinus brady HR 54, normal axis, QTc 415 ms  Recent Labs: 09/05/2015: BUN 17; Creat 1.25; Hemoglobin 17.1; Magnesium 2.1; Platelets 218; Potassium 3.6; Sodium 142; TSH 2.32   Recent Lipid Panel    Component Value Date/Time   CHOL 173 02/22/2011 0914   TRIG 87.0 02/22/2011 0914   HDL 40.50 02/22/2011 0914   CHOLHDL 4 02/22/2011 0914   VLDL 17.4 02/22/2011 0914   LDLCALC 115* 02/22/2011 0914    Additional studies/ records that were reviewed today include:    Echo 08/19/15 - Left ventricle: False tendon in LV apex of no clincal  significance. The cavity size was normal. Systolic function was  normal. The estimated ejection fraction was in the range of 50%  to 55%. Wall motion was normal; there were no regional wall  motion abnormalities. - Mitral valve: There was trivial regurgitation. - Left atrium: The atrium was mildly dilated. - Pulmonary arteries: PA peak pressure: 35 mm Hg (S).  LHC 4/02 IMPRESSION: 1.  Normal coronary angiography. 2. Normal single plane ventriculogram. 3. Consider other etiologies for his chest pain.  ASSESSMENT:     1. Persistent atrial fibrillation ([place])   2. Essential hypertension   3. Hyperlipidemia   4. Snoring   5. LV dysfunction     PLAN:     In order of problems listed above:  1. Persistent AF - Maintaining NSR now s/p TEE-DCCV.  With hx of central retinal vein occlusion, would give him a CHADS2-VASc=3 and continue long term anticoagulation.   Continue Eliquis, Nebivolol.  With hx of fatigue in AFib, rhythm control strategy seems to be the most appropriate.  Therefore, if he has recurrent AF, we will need to consider anti-arrhythmic drug therapy.  He may need intermittent FMLA filled out and I asked him to drop off any forms he needs filled out.   2. HTN - BP uncontrolled.  We discussed increasing Avalide vs adding Amlodipine.  He would like to avoid adding another medication.    -  Increase Avalide to 300/12.5 mg QD.  -  BMET 1 week  -  Monitor BP; bring recordings to FU in 3-4 weeks (he would prefer to FU with PCP - lives closer to PCP)  3. HL - Continue Crestor.  FU with PCP.  4. Snoring - With hx of AF, will arrange sleep study.  If + for OSA, treating OSA will help BP management.  5. LV dysfunction - TEE suggested lower than normal EF.  Transthoracic Echo last month demonstrated normal EF.  Will continue beta-blocker, angiotensin receptor blocker.  Once BP better controlled, will repeat  TTE.     Medication Adjustments/Labs and Tests Ordered: Current medicines are reviewed at length with the patient today.  Concerns regarding medicines are outlined above.  Medication changes, Labs and Tests ordered today are outlined in the Patient Instructions noted below. Patient Instructions  Medication Instructions:  1. INCREASE AVALIDE TO 300/12.5 MG DAILY; NEW RX HAS BEEN SENT IN TODAY Labwork: 1 WEEK YOU WILL NEED LAB WORK (BMET); THIS WILL NEED TO BE DONE 1 WEEK AFTER INCREASING THE AVALIDE. Testing/Procedures: Your physician has recommended that you have a sleep study. This test records several body functions during sleep, including: brain activity, eye movement, oxygen and carbon dioxide blood levels, heart rate and rhythm, breathing rate and rhythm, the flow of air through your mouth and nose, snoring, body muscle movements, and chest and belly movement. Follow-Up: DR. Acie Fredrickson IN 3 MONTHS  Any Other Special Instructions Will Be Listed Below (If Applicable). CHECK BP DAILY AND BRING A LIST OF READINGS TO YOUR NEXT APPT WITH YOUR PRIMARY CARE PHYSICIAN  If you need a refill on your cardiac medications before your next appointment, please call your pharmacy.   Signed, [name], PA-C  09/22/2015 1:08 PM    [facility] [place], [place], [place]  [address] Phone: [phone]; Fax: [phone]

## 2015-09-22 NOTE — Patient Instructions (Addendum)
Medication Instructions:  1. INCREASE AVALIDE TO 300/12.5 MG DAILY; NEW RX HAS BEEN SENT IN TODAY Labwork: 1 WEEK YOU WILL NEED LAB WORK (BMET); THIS WILL NEED TO BE DONE 1 WEEK AFTER INCREASING THE AVALIDE. Testing/Procedures: Your physician has recommended that you have a sleep study. This test records several body functions during sleep, including: brain activity, eye movement, oxygen and carbon dioxide blood levels, heart rate and rhythm, breathing rate and rhythm, the flow of air through your mouth and nose, snoring, body muscle movements, and chest and belly movement. Follow-Up: DR. Acie Fredrickson IN 3 MONTHS  Any Other Special Instructions Will Be Listed Below (If Applicable). CHECK BP DAILY AND BRING A LIST OF READINGS TO YOUR NEXT APPT WITH YOUR PRIMARY CARE PHYSICIAN  If you need a refill on your cardiac medications before your next appointment, please call your pharmacy.

## 2015-09-30 ENCOUNTER — Other Ambulatory Visit (INDEPENDENT_AMBULATORY_CARE_PROVIDER_SITE_OTHER): Payer: Managed Care, Other (non HMO)

## 2015-09-30 DIAGNOSIS — I10 Essential (primary) hypertension: Secondary | ICD-10-CM

## 2015-10-01 LAB — BASIC METABOLIC PANEL
BUN: 19 mg/dL (ref 7–25)
CALCIUM: 9 mg/dL (ref 8.6–10.3)
CO2: 26 mmol/L (ref 20–31)
CREATININE: 1.17 mg/dL (ref 0.70–1.25)
Chloride: 102 mmol/L (ref 98–110)
GLUCOSE: 90 mg/dL (ref 65–99)
Potassium: 3.7 mmol/L (ref 3.5–5.3)
Sodium: 141 mmol/L (ref 135–146)

## 2015-10-02 ENCOUNTER — Telehealth: Payer: Self-pay | Admitting: *Deleted

## 2015-10-02 NOTE — Telephone Encounter (Signed)
Lmtcb to go over lab result. I also tried the home # which a recording came on said # is disconnected.

## 2015-10-02 NOTE — Telephone Encounter (Signed)
Pt notified of lab results by phone with verbal understanding.  

## 2015-11-11 ENCOUNTER — Ambulatory Visit (HOSPITAL_BASED_OUTPATIENT_CLINIC_OR_DEPARTMENT_OTHER): Payer: Managed Care, Other (non HMO) | Attending: Physician Assistant | Admitting: Cardiology

## 2015-11-11 VITALS — Ht 73.0 in | Wt 209.0 lb

## 2015-11-11 DIAGNOSIS — R5383 Other fatigue: Secondary | ICD-10-CM | POA: Diagnosis not present

## 2015-11-11 DIAGNOSIS — Z7901 Long term (current) use of anticoagulants: Secondary | ICD-10-CM | POA: Diagnosis not present

## 2015-11-11 DIAGNOSIS — I10 Essential (primary) hypertension: Secondary | ICD-10-CM | POA: Diagnosis not present

## 2015-11-11 DIAGNOSIS — I481 Persistent atrial fibrillation: Secondary | ICD-10-CM

## 2015-11-11 DIAGNOSIS — R0683 Snoring: Secondary | ICD-10-CM | POA: Diagnosis not present

## 2015-11-11 DIAGNOSIS — Z79899 Other long term (current) drug therapy: Secondary | ICD-10-CM | POA: Insufficient documentation

## 2015-11-11 DIAGNOSIS — I4819 Other persistent atrial fibrillation: Secondary | ICD-10-CM

## 2015-11-16 NOTE — Procedures (Signed)
Patient Name: Terry [name], Terry [name] Date: 11/11/2015 Gender: Male D.O.B: [birth year] Age (years): 5 Referring Provider: [name] Height (inches): 43 Interpreting Physician: Fransico Him MD, ABSM Weight (lbs): 209 RPSGT: Carolin Coy BMI: 28 MRN: [id number] Neck Size: 15.50  CLINICAL INFORMATION Sleep Study Type: NPSG Indication for sleep study: Excessive Daytime Sleepiness, Fatigue, Hypertension, Snoring Epworth Sleepiness Score: 3  SLEEP STUDY TECHNIQUE As per the AASM Manual for the Scoring of Sleep and Associated Events v2.3 (April 2016) with a hypopnea requiring 4% desaturations. The channels recorded and monitored were frontal, central and occipital EEG, electrooculogram (EOG), submentalis EMG (chin), nasal and oral airflow, thoracic and abdominal wall motion, anterior tibialis EMG, snore microphone, electrocardiogram, and pulse oximetry.  MEDICATIONS Patient's medications include: eliquis, FLONASE, aller-tec. Medications self-administered by patient during sleep study : No sleep medicine administered.  SLEEP ARCHITECTURE The study was initiated at 10:43:00 PM and ended at 5:06:07 AM. Sleep onset time was 7.4 minutes and the sleep efficiency was 87.7%. The total sleep time was 336.0 minutes. Stage REM latency was 117.5 minutes. The patient spent 13.69% of the night in stage N1 sleep, 66.07% in stage N2 sleep, 0.00% in stage N3 and 20.24% in REM. Alpha intrusion was absent. Supine sleep was 28.27%.  RESPIRATORY PARAMETERS The overall apnea/hypopnea index (AHI) was 0.9 per hour. There were 3 total apneas, including 3 obstructive, 0 central and 0 mixed apneas. There were 2 hypopneas and 52 RERAs. The AHI during Stage REM sleep was 2.6 per hour. AHI while supine was 1.9 per hour. The mean oxygen saturation was 93.11%. The minimum SpO2 during sleep was 89.00%. Soft snoring was noted during this study.  CARDIAC DATA The 2 lead EKG demonstrated sinus rhythm. The mean  heart rate was 51.43 beats per minute. Other EKG findings include: none  LEG MOVEMENT DATA The total PLMS were 7 with a resulting PLMS index of 1.25. Associated arousal with leg movement index was 0.4 .  IMPRESSIONS - No significant obstructive sleep apnea occurred during this study (AHI = 0.9/h). - No significant central sleep apnea occurred during this study (CAI = 0.0/h). - Mild oxygen desaturation was noted during this study (Min O2 = 89.00%). - The patient snored with Soft snoring volume. - Clinically significant periodic limb movements did not occur during sleep. No significant associated arousals.  DIAGNOSIS - Normal study  RECOMMENDATIONS - Avoid alcohol, sedatives and other CNS depressants that may worsen sleep apnea and disrupt normal sleep architecture. - Sleep hygiene should be reviewed to assess factors that may improve sleep quality. - Weight management and regular exercise should be initiated or continued if appropriate.     [place], American Board of Sleep Medicine  ELECTRONICALLY SIGNED ON:  11/16/2015, 4:52 PM [place] PH: [phone]   FX: [phone] [place]

## 2015-11-18 NOTE — Progress Notes (Signed)
Called patient to give sleep study results. VM is full and unable to accept any more messages. Will try back later

## 2015-11-21 NOTE — Progress Notes (Signed)
11/21/15  Left vm for patient to call back for sleep study results. (second attempt)

## 2015-11-24 NOTE — Progress Notes (Addendum)
11/24/15  Called patient to give sleep study results. No answer and vm full and can't accept more messages. (third attempt)  11/24/15 After multiple failed attempts,mailed letter requesting patient call for results.

## 2015-12-02 ENCOUNTER — Telehealth: Payer: Self-pay | Admitting: Cardiology

## 2015-12-02 NOTE — Telephone Encounter (Signed)
Follow Up:    Pt have been trying to get his sleep study results,been missing your calls. If possible would you please call after 5:00. It is so hard for him to get calls during the day,he works at a call center.

## 2015-12-04 ENCOUNTER — Encounter: Payer: Self-pay | Admitting: *Deleted

## 2015-12-04 NOTE — Telephone Encounter (Signed)
Due to multiple attempts at reaching patient.  Letter was sent with normal sleep study results.

## 2016-01-07 ENCOUNTER — Encounter: Payer: Self-pay | Admitting: Cardiovascular Disease

## 2016-01-07 ENCOUNTER — Ambulatory Visit (INDEPENDENT_AMBULATORY_CARE_PROVIDER_SITE_OTHER): Payer: Managed Care, Other (non HMO) | Admitting: Cardiovascular Disease

## 2016-01-07 VITALS — BP 150/116 | HR 60 | Ht 73.0 in | Wt 212.8 lb

## 2016-01-07 DIAGNOSIS — I480 Paroxysmal atrial fibrillation: Secondary | ICD-10-CM

## 2016-01-07 DIAGNOSIS — I10 Essential (primary) hypertension: Secondary | ICD-10-CM | POA: Diagnosis not present

## 2016-01-07 MED ORDER — SPIRONOLACTONE 25 MG PO TABS: 25.0000 mg | ORAL_TABLET | Freq: Every day | ORAL | 11 refills | Status: DC

## 2016-01-07 NOTE — Patient Instructions (Addendum)
Medication Instructions:  START Aldactone (Spironolactone) 25 mg once daily   Labwork: Your physician recommends that you return for lab work at your Nurse Visit for basic metabolic panel   Testing/Procedures: None Ordered   Follow-Up: Your physician recommends that you return for BP check/Nurse visit on Friday Nov. 17 at 11:00 am  Your physician recommends that you schedule a follow-up appointment in: 3 months with Dr. Acie Fredrickson.    If you need a refill on your cardiac medications before your next appointment, please call your pharmacy.   Thank you for choosing [facility]! Christen Bame, RN [phone]

## 2016-01-07 NOTE — Progress Notes (Signed)
Terry [name] Date of Birth  08/30/1950 [facility] [address]    [place], [place]  [address] [phone]   Problem List 1. Hx of chest pain: Normal cath in 2002 Terry [name])  A single plane ventriculogram revealed normal wall motion, ejection fraction  of approximately 65%. There was no gradient noted on pullback.  Fluoroscopy revealed no significant calcification.  Coronary angiography:  1. The left main coronary artery bifurcated into the left anterior descending  and circumflex vessel. There was no significant disease in the left main  coronary artery.  2. Left anterior descending: The left anterior descending artery gave rise to  a moderate sized D-1, a small D-2, and went on to end as an apical  recurrent branch. There was no significant disease in the left anterior  descending or its branches.  3. Circumflex vessel: The circumflex vessel was a large caliber vessel that  gave rise to a large branching obtuse marginal, then went on to end as an  AV groove vessel.  4. Right coronary artery: The right coronary artery was dominant for the  posterior circulation. The right coronary artery gave rise to two small  ______ marginals, a moderate sized PDA, and two large PL branches. There  was no significant disease in the right coronary artery or its branches.  2. Paroxysmal atrial fib 3. Essential HTN   Terry [name] is a 65 year old gentleman with a history of hypertension and hyperlipidemia. He's done very well since I last saw him last year. He still cycling on a regular basis. He denies any episodes of syncope or presyncope.  Oct. 14, 2014:  Terry [name] was seen in the ER several weeks ago for chest pain.   On Sept. 24 - he awoke with CP.  He reports having some leg cramping earlier that night.  He has been lifting weights and does not think it was MSK.   The pain was mid sternal, radiated outward,  Pulsating like CP.  The pain lasted for several hours and  he went to the ED.  The pain resolved with Toradol.   He had some residual CP for then next several days.  He took motrin - the discomfort was gone after several days.    He has esopageal pain with  Eating dry rice, dry chicken.   Oct. 25, 2017:  [name] is doing well BP is elevated.  Saw Terry [name] several months ago.  Irbesartan was doubled.  His sleep study was negative.  Is not taking the Kdur - thinks it may be causig him gas  Had a TEE / Cardioversion in September 08, 2015:    BP is elevated .   Does not eat any salty foods.  Has slacked off on his cycling.   [place] [facility] [place] [facility] .   Has a strong family hx of  HTN   Current Outpatient Prescriptions on File Prior to Visit  Medication Sig Dispense Refill  . apixaban (ELIQUIS) 5 MG TABS tablet Take 1 tablet (5 mg total) by mouth 2 (two) times daily. 60 tablet 9  . cetirizine (ZYRTEC ALLERGY) 10 MG tablet Take 10 mg by mouth daily.    . fluticasone (FLONASE) 50 MCG/ACT nasal spray Place 2 sprays into both nostrils daily.    . hydrocortisone cream 1 % Apply 1 application topically 3 (three) times daily as needed for itching (skin irritation). 30 g 0  . irbesartan-hydrochlorothiazide (AVALIDE) 300-12.5 MG tablet Take 1 tablet  by mouth daily. 90 tablet 3  . Nebivolol HCl (BYSTOLIC) 20 MG TABS Take 20 mg by mouth daily.    Terry [name] OVER THE COUNTER MEDICATION Take 3 capsules by mouth as needed (when working out). VOLCANO pre workout 1 mg Vitamin b 6 Nitric oxide 1520 mg L-citruline agmapre Agmatine sulfate L-norvaline Creatine strength complex    . potassium chloride SA (K-DUR,KLOR-CON) 20 MEQ tablet Take 1 tablet (20 mEq total) by mouth daily. 90 tablet 3  . rosuvastatin (CRESTOR) 5 MG tablet Take 5 mg by mouth daily.    . tadalafil (CIALIS) 20 MG tablet Take 1 tablet (20 mg total) by mouth daily as needed. 15 tablet 5   No current facility-administered medications on file prior to visit.     Allergies  Allergen Reactions    . Eggs Or Egg-Derived Products Other (See Comments)  . Penicillins Other (See Comments)    Childhood reaction    Past Medical History:  Diagnosis Date  . Atrial fibrillation ([place])    a. Dx 06/2015.  Terry [name] Central retinal vein occlusion   . ED (erectile dysfunction)   . H/O cardiac catheterization 2002 - normal cors  . HTN (hypertension)   . Hyperlipidemia   . Hypokalemia   . Leg edema   . Mini stroke [facility])    a. ? Age 89 at time of central retinal vein occlusion - lost vision in eye  . Pericarditis 2010  . Syncope    Hx of thought to be due to orthostatic hypotension    Past Surgical History:  Procedure Laterality Date  . CARDIAC CATHETERIZATION  2002  . CARDIOVERSION N/A 09/08/2015   Procedure: CARDIOVERSION;  Surgeon: Fay Records, MD;  Location: [place];  Service: Cardiovascular;  Laterality: N/A;  . HAND SURGERY  2003  . KNEE SURGERY    . NASAL SINUS SURGERY  2003   x2. Scar tissue build up and had to recreate his tear duct  . PERICARDIECTOMY    . TEE WITHOUT CARDIOVERSION N/A 09/08/2015   Procedure: TRANSESOPHAGEAL ECHOCARDIOGRAM (TEE);  Surgeon: Fay Records, MD;  Location: [facility] ENDOSCOPY;  Service: Cardiovascular;  Laterality: N/A;    History  Smoking Status  . Never Smoker  Smokeless Tobacco  . Not on file    History  Alcohol Use  . Yes    Comment: 2-3 Times a Week Wine with Meals    Family History  Problem Relation Age of Onset  . Atrial fibrillation Father   . Heart disease Father     had pacemaker  . Hypertension Father   . Diabetes Father   . Hypertension Mother   . Diabetes Mother   . Cancer Mother     Reviw of Systems:  Reviewed in the HPI.  All other systems are negative.  Physical Exam: BP (!) 150/116 (BP Location: Right Arm, Patient Position: Sitting, Cuff Size: Large)   Pulse 60   Ht 6\' 1"  (1.854 m)   Wt 212 lb 12.8 oz (96.5 kg)   BMI 28.08 kg/m  The patient is alert and oriented x 3.  The mood and affect are normal.   Skin:  warm and dry.  Color is normal.    HEENT:   Normocephalic/atraumatic. He has no JVD. Carotids are normal. His mucous membranes are moist.  Lungs: His lung exam is clear   Heart: Regular rate S1-S2.    Abdomen: Good bowel sounds. There is no hepatosplenomegaly. His abdomen is nontender.  Extremities:  No clubbing cyanosis  or edema  Neuro:   exam is nonfocal    ECG: Oct. 14, 2014:  Sinus bradycardia at 52 . Otherwise the EKG is normal.  No evidence of pericarditis.  Assessment / Plan:   1. Paroxysmal atrial fibrillation: Jane seems to be doing well. He's not had any recurrent episodes of atrial fibrillation. We will continue with the current dose of Eliquis. 2. Essential hypertension: Zeric continues to have elevated blood pressure. He does not eat much salt. He's on low-dose of HCTZ as well as irbesartan.  At this point I would like to add Aldactone 25 mg a day. We'll see him back in the office in 2-3 weeks for follow-up nurse visit for a basic metabolic profile and blood pressure check. We'll consider increasing his HCTZ and will consider changing the irbesartan to valsartan if his blood pressure remains elevated. We will also consider adding amlodipine.  I'll see him in 3 months.    Mertie Moores, MD  01/07/2016 5:06 PM    [facility] [place],  [place], [place]  [address] Pager [phone] Phone: [phone]; Fax: [phone]

## 2016-01-30 ENCOUNTER — Ambulatory Visit (INDEPENDENT_AMBULATORY_CARE_PROVIDER_SITE_OTHER): Payer: Managed Care, Other (non HMO) | Admitting: Nurse Practitioner

## 2016-01-30 ENCOUNTER — Other Ambulatory Visit: Payer: Managed Care, Other (non HMO)

## 2016-01-30 DIAGNOSIS — I10 Essential (primary) hypertension: Secondary | ICD-10-CM

## 2016-01-30 DIAGNOSIS — I480 Paroxysmal atrial fibrillation: Secondary | ICD-10-CM | POA: Diagnosis not present

## 2016-01-30 LAB — BASIC METABOLIC PANEL
BUN: 18 mg/dL (ref 7–25)
CHLORIDE: 103 mmol/L (ref 98–110)
CO2: 28 mmol/L (ref 20–31)
CREATININE: 1.01 mg/dL (ref 0.70–1.25)
Calcium: 9.2 mg/dL (ref 8.6–10.3)
Glucose, Bld: 79 mg/dL (ref 65–99)
POTASSIUM: 4.1 mmol/L (ref 3.5–5.3)
Sodium: 140 mmol/L (ref 135–146)

## 2016-01-30 NOTE — Patient Instructions (Addendum)
Medication Instructions:  Your physician recommends that you continue on your current medications as directed. Please refer to the Current Medication list given to you today.   Labwork: TODAY - basic metabolic panel   Testing/Procedures: None Ordered   Follow-Up: Your physician recommends that you schedule a follow-up appointment in: 3 months with Dr. Nahser   If you need a refill on your cardiac medications before your next appointment, please call your pharmacy.   Thank you for choosing [facility]! Dimonique Bourdeau, RN [phone]    

## 2016-01-30 NOTE — Progress Notes (Signed)
1.) Reason for visit: BP check  2.) Name of MD requesting visit: Dr. Acie Fredrickson  3.) H&P: patient presents for BP recheck after starting spironolactone at last office visit with Dr. Acie Fredrickson on 10/25.  4.) ROS related to problem: patient denies complaints; states he has been monitoring BP at home and gets systolic readings around [id number] mmHg and diastolic readings around 70 mmHg  5.) Assessment and plan per MD: BP Today 134/78, pulse 60 bpm and regular.  Per Dr. Acie Fredrickson, continue current treatment plan.

## 2016-04-08 ENCOUNTER — Encounter: Payer: Self-pay | Admitting: Cardiovascular Disease

## 2016-04-08 ENCOUNTER — Ambulatory Visit (INDEPENDENT_AMBULATORY_CARE_PROVIDER_SITE_OTHER): Payer: Managed Care, Other (non HMO) | Admitting: Cardiovascular Disease

## 2016-04-08 VITALS — BP 120/80 | HR 60 | Ht 73.0 in | Wt 208.1 lb

## 2016-04-08 DIAGNOSIS — I10 Essential (primary) hypertension: Secondary | ICD-10-CM | POA: Diagnosis not present

## 2016-04-08 DIAGNOSIS — E782 Mixed hyperlipidemia: Secondary | ICD-10-CM

## 2016-04-08 MED ORDER — LOSARTAN POTASSIUM 50 MG PO TABS
50.0000 mg | ORAL_TABLET | Freq: Every day | ORAL | 3 refills | Status: DC
Start: 2016-04-08 — End: 2017-03-24

## 2016-04-08 NOTE — Progress Notes (Signed)
Terry [name] Date of Birth  Aug 07, 1950 [facility] [address]    [place], [place]  [address] [phone]   Problem List 1. Hx of chest pain: Normal cath in 2002 Terry [name])  A single plane ventriculogram revealed normal wall motion, ejection fraction  of approximately 65%. There was no gradient noted on pullback.  Fluoroscopy revealed no significant calcification.  Coronary angiography:  1. The left main coronary artery bifurcated into the left anterior descending  and circumflex vessel. There was no significant disease in the left main  coronary artery.  2. Left anterior descending: The left anterior descending artery gave rise to  a moderate sized D-1, a small D-2, and went on to end as an apical  recurrent branch. There was no significant disease in the left anterior  descending or its branches.  3. Circumflex vessel: The circumflex vessel was a large caliber vessel that  gave rise to a large branching obtuse marginal, then went on to end as an  AV groove vessel.  4. Right coronary artery: The right coronary artery was dominant for the  posterior circulation. The right coronary artery gave rise to two small  ______ marginals, a moderate sized PDA, and two large PL branches. There  was no significant disease in the right coronary artery or its branches.  2. Paroxysmal atrial fib 3. Essential HTN   Terry [name] is a 66 year old gentleman with a history of hypertension and hyperlipidemia. He's done very well since I last saw him last year. He still cycling on a regular basis. He denies any episodes of syncope or presyncope.  Oct. 14, 2014:  [name] was seen in the ER several weeks ago for chest pain.   On Sept. 24 - he awoke with CP.  He reports having some leg cramping earlier that night.  He has been lifting weights and does not think it was MSK.   The pain was mid sternal, radiated outward,  Pulsating like CP.  The pain lasted for several hours and  he went to the ED.  The pain resolved with Toradol.   He had some residual CP for then next several days.  He took motrin - the discomfort was gone after several days.    He has esopageal pain with  Eating dry rice, dry chicken.   Oct. 25, 2017:  Terry [name] is doing well BP is elevated.  Saw [name] several months ago.  Irbesartan was doubled.  His sleep study was negative.  Is not taking the Kdur - thinks it may be causig him gas  Had a TEE / Cardioversion in September 08, 2015:    BP is elevated .   Does not eat any salty foods.  Has slacked off on his cycling.   [facility] .   Has a strong family hx of  HTN   Jan. 25, 2018:  Feeling well.   BP is much better on the Aldactone  Still at the [facility] regularly  Stopped taking the Kdur due to stomach ache.    Current Outpatient Prescriptions on File Prior to Visit  Medication Sig Dispense Refill  . apixaban (ELIQUIS) 5 MG TABS tablet Take 1 tablet (5 mg total) by mouth 2 (two) times daily. 60 tablet 9  . cetirizine (ZYRTEC ALLERGY) 10 MG tablet Take 10 mg by mouth daily.    [name] FLUARIX QUADRIVALENT 0.5 ML injection Inject as directed once.  0  . fluticasone (FLONASE)  50 MCG/ACT nasal spray Place 2 sprays into both nostrils daily.    . hydrocortisone cream 1 % Apply 1 application topically 3 (three) times daily as needed for itching (skin irritation). 30 g 0  . irbesartan-hydrochlorothiazide (AVALIDE) 300-12.5 MG tablet Take 1 tablet by mouth daily. 90 tablet 3  . Nebivolol HCl (BYSTOLIC) 20 MG TABS Take 20 mg by mouth daily.    [name] OVER THE COUNTER MEDICATION Take 3 capsules by mouth as needed (when working out). VOLCANO pre workout 1 mg Vitamin b 6 Nitric oxide 1520 mg L-citruline agmapre Agmatine sulfate L-norvaline Creatine strength complex    . potassium chloride SA (K-DUR,KLOR-CON) 20 MEQ tablet Take 1 tablet (20 mEq total) by mouth daily. 90 tablet 3  . rosuvastatin (CRESTOR) 5 MG tablet Take 5 mg by mouth  daily.    . tadalafil (CIALIS) 20 MG tablet Take 1 tablet (20 mg total) by mouth daily as needed. 15 tablet 5  . spironolactone (ALDACTONE) 25 MG tablet Take 1 tablet (25 mg total) by mouth daily. 30 tablet 11   No current facility-administered medications on file prior to visit.     Allergies  Allergen Reactions  . Eggs Or Egg-Derived Products Other (See Comments)  . Penicillins Other (See Comments)    Childhood reaction    Past Medical History:  Diagnosis Date  . Atrial fibrillation ([place])    a. Dx 06/2015.  [name] Central retinal vein occlusion   . ED (erectile dysfunction)   . H/O cardiac catheterization 2002 - normal cors  . HTN (hypertension)   . Hyperlipidemia   . Hypokalemia   . Leg edema   . Mini stroke [facility])    a. ? Age 46 at time of central retinal vein occlusion - lost vision in eye  . Pericarditis 2010  . Syncope    Hx of thought to be due to orthostatic hypotension    Past Surgical History:  Procedure Laterality Date  . CARDIAC CATHETERIZATION  2002  . CARDIOVERSION N/A 09/08/2015   Procedure: CARDIOVERSION;  Surgeon: Fay Records, MD;  Location: [facility];  Service: Cardiovascular;  Laterality: N/A;  . HAND SURGERY  2003  . KNEE SURGERY    . NASAL SINUS SURGERY  2003   x2. Scar tissue build up and had to recreate his tear duct  . PERICARDIECTOMY    . TEE WITHOUT CARDIOVERSION N/A 09/08/2015   Procedure: TRANSESOPHAGEAL ECHOCARDIOGRAM (TEE);  Surgeon: Fay Records, MD;  [facility] ENDOSCOPY;  Service: Cardiovascular;  Laterality: N/A;    History  Smoking Status  . Never Smoker  Smokeless Tobacco  . Never Used    History  Alcohol Use  . Yes    Comment: 2-3 Times a Week Wine with Meals    Family History  Problem Relation Age of Onset  . Atrial fibrillation Father   . Heart disease Father     had pacemaker  . Hypertension Father   . Diabetes Father   . Hypertension Mother   . Diabetes Mother   . Cancer Mother     Reviw of Systems:    Reviewed in the HPI.  All other systems are negative.  Physical Exam: BP 120/80 (BP Location: Right Arm, Patient Position: Sitting, Cuff Size: Large)   Pulse 60   Ht 6\' 1"  (1.854 m)   Wt 208 lb 1.9 oz (94.4 kg)   SpO2 97%   BMI 27.46 kg/m  The patient is alert and oriented x 3.  The mood  and affect are normal.   Skin: warm and dry.  Color is normal.    HEENT:   Normocephalic/atraumatic. He has no JVD. Carotids are normal. His mucous membranes are moist.  Lungs: His lung exam is clear   Heart: Regular rate S1-S2.    Abdomen: Good bowel sounds. There is no hepatosplenomegaly. His abdomen is nontender.  Extremities:  No clubbing cyanosis or edema  Neuro:   exam is nonfocal    ECG: Oct. 14, 2014:  Sinus bradycardia at 52 . Otherwise the EKG is normal.  No evidence of pericarditis.  Assessment / Plan:   1. Paroxysmal atrial fibrillation: Jaceyon seems to be doing well. He's not had any recurrent episodes of atrial fibrillation. We will continue with the current dose of Eliquis.  2. Essential hypertension:  We added Aldactone which has helped quite a bit.  Continue current   I'll see him in 6 months.    Mertie Moores, MD  04/08/2016 4:10 PM    [facility] [place],  [place], [place]  [address] Pager [phone] Phone: [phone]; Fax: [phone]

## 2016-04-08 NOTE — Patient Instructions (Signed)
Medication Instructions:  STOP Irbesartan START Losartan (Cozaar) 50 mg once daily   Labwork: TODAY - complete metabolic panel, cholesterol  Your physician recommends that you return for lab work in: 3 weeks for basic metabolic panel   Testing/Procedures: None Ordered   Follow-Up: Your physician wants you to follow-up in: 6 months with Dr. Acie Fredrickson.  You will receive a reminder letter in the mail two months in advance. If you don't receive a letter, please call our office to schedule the follow-up appointment.   If you need a refill on your cardiac medications before your next appointment, please call your pharmacy.   Thank you for choosing [facility]! Christen Bame, RN [phone]

## 2016-04-09 LAB — LIPID PANEL
CHOL/HDL RATIO: 4.4 ratio (ref 0.0–5.0)
Cholesterol, Total: 173 mg/dL (ref 100–199)
HDL: 39 mg/dL — ABNORMAL LOW (ref >39)
LDL CALC: 90 mg/dL (ref 0–99)
Triglycerides: 220 mg/dL — ABNORMAL HIGH (ref 0–149)
VLDL CHOLESTEROL CAL: 44 mg/dL — AB (ref 5–40)

## 2016-04-09 LAB — COMPREHENSIVE METABOLIC PANEL
ALBUMIN: 4.4 g/dL (ref 3.6–4.8)
ALK PHOS: 60 IU/L (ref 39–117)
ALT: 32 IU/L (ref 0–44)
AST: 20 IU/L (ref 0–40)
Albumin/Globulin Ratio: 1.8 (ref 1.2–2.2)
BUN/Creatinine Ratio: 18 (ref 10–24)
BUN: 26 mg/dL (ref 8–27)
Bilirubin Total: 0.6 mg/dL (ref 0.0–1.2)
CHLORIDE: 99 mmol/L (ref 96–106)
CO2: 26 mmol/L (ref 18–29)
CREATININE: 1.44 mg/dL — AB (ref 0.76–1.27)
Calcium: 9.6 mg/dL (ref 8.6–10.2)
GFR calc Af Amer: 59 mL/min/{1.73_m2} — ABNORMAL LOW (ref >59)
GFR calc non Af Amer: 51 mL/min/{1.73_m2} — ABNORMAL LOW (ref >59)
GLUCOSE: 94 mg/dL (ref 65–99)
Globulin, Total: 2.4 g/dL (ref 1.5–4.5)
Potassium: 4.5 mmol/L (ref 3.5–5.2)
Sodium: 139 mmol/L (ref 134–144)
Total Protein: 6.8 g/dL (ref 6.0–8.5)

## 2016-04-29 ENCOUNTER — Other Ambulatory Visit (INDEPENDENT_AMBULATORY_CARE_PROVIDER_SITE_OTHER): Payer: Managed Care, Other (non HMO)

## 2016-04-29 DIAGNOSIS — I10 Essential (primary) hypertension: Secondary | ICD-10-CM | POA: Diagnosis not present

## 2016-04-30 LAB — BASIC METABOLIC PANEL
BUN/Creatinine Ratio: 14 (ref 10–24)
BUN: 17 mg/dL (ref 8–27)
CO2: 24 mmol/L (ref 18–29)
CREATININE: 1.2 mg/dL (ref 0.76–1.27)
Calcium: 9.5 mg/dL (ref 8.6–10.2)
Chloride: 100 mmol/L (ref 96–106)
GFR calc Af Amer: 74 mL/min/{1.73_m2} (ref >59)
GFR, EST NON AFRICAN AMERICAN: 64 mL/min/{1.73_m2} (ref >59)
GLUCOSE: 91 mg/dL (ref 65–99)
Potassium: 4.7 mmol/L (ref 3.5–5.2)
SODIUM: 140 mmol/L (ref 134–144)

## 2016-06-03 ENCOUNTER — Other Ambulatory Visit: Payer: Self-pay | Admitting: Physician Assistant

## 2016-06-03 DIAGNOSIS — I480 Paroxysmal atrial fibrillation: Secondary | ICD-10-CM

## 2016-06-03 DIAGNOSIS — I10 Essential (primary) hypertension: Secondary | ICD-10-CM

## 2016-06-03 DIAGNOSIS — E785 Hyperlipidemia, unspecified: Secondary | ICD-10-CM

## 2016-11-12 ENCOUNTER — Other Ambulatory Visit: Payer: Self-pay | Admitting: Cardiovascular Disease

## 2016-11-12 DIAGNOSIS — I480 Paroxysmal atrial fibrillation: Secondary | ICD-10-CM

## 2016-11-12 DIAGNOSIS — I10 Essential (primary) hypertension: Secondary | ICD-10-CM

## 2016-11-12 DIAGNOSIS — E785 Hyperlipidemia, unspecified: Secondary | ICD-10-CM

## 2016-12-21 ENCOUNTER — Other Ambulatory Visit: Payer: Self-pay | Admitting: Cardiovascular Disease

## 2017-02-24 ENCOUNTER — Encounter: Payer: Self-pay | Admitting: Gastroenterology

## 2017-03-24 ENCOUNTER — Other Ambulatory Visit: Payer: Self-pay | Admitting: Cardiovascular Disease

## 2017-03-25 ENCOUNTER — Other Ambulatory Visit: Payer: Self-pay | Admitting: Cardiovascular Disease

## 2017-04-12 ENCOUNTER — Telehealth: Payer: Self-pay

## 2017-04-12 NOTE — Telephone Encounter (Signed)
I have done a Eliquis PA through covermymeds.

## 2017-04-26 ENCOUNTER — Other Ambulatory Visit: Payer: Self-pay | Admitting: Cardiovascular Disease

## 2017-05-08 ENCOUNTER — Other Ambulatory Visit: Payer: Self-pay | Admitting: Cardiovascular Disease

## 2017-05-08 DIAGNOSIS — E785 Hyperlipidemia, unspecified: Secondary | ICD-10-CM

## 2017-05-08 DIAGNOSIS — I10 Essential (primary) hypertension: Secondary | ICD-10-CM

## 2017-05-08 DIAGNOSIS — I480 Paroxysmal atrial fibrillation: Secondary | ICD-10-CM

## 2017-05-24 ENCOUNTER — Encounter: Payer: Self-pay | Admitting: *Deleted

## 2017-05-24 ENCOUNTER — Other Ambulatory Visit: Payer: Self-pay | Admitting: Cardiovascular Disease

## 2017-05-30 ENCOUNTER — Encounter: Payer: Self-pay | Admitting: Physician Assistant

## 2017-05-30 NOTE — Progress Notes (Addendum)
Cardiology Office Note    Date:  05/31/2017  ID:  Terry [name], DOB 02-Sep-1950, MRN [id number] PCP:  Lawerance Cruel, MD  Cardiologist:  Dr. Acie Fredrickson   Chief Complaint: f/u atrial fib  History of Present Illness:  Terry [name] is a 67 y.o. male with history of persistent atrial fibrillation (dx 06/2015 s/p TEE/DCCV 08/2015), HTN, HLD, h/o syncope felt r/t orthostasis, pericarditis 2010, normal cors 2002, central retinal vein occlusion with possible mini-stroke at that time (age 26) s/p laser therapy who is here to f/u atrial fib.  Atrial fib was diagnosed by PCP 06/2015 at which time the patient had noticed sluggishness but had been going to the gym regularly without acute symptoms. 2D Echo 08/19/15: false tendon in LV apex of no clinical significance, EF 50-55%, no RWMA, mild LAE, PASP 71mmHg. He was started on Eliquis 5mg  BID with consideration of cardioversion after 3-4 weeks of anticoagulation. He underwent TEE 08/2015 which showed large LA with spontaneous contrast swirling, mildly depressed LVEF (number not given) -> went on to have DCCV. Sleep study 10/2015 was normal. Last labs showed Cr of 1.2-1.4, LFTs ok, K 4.7, LDL 90 (early 2018), TSH wnl (2017). CHADSVASC 3 for HTN/TIA, possibly 4 for mild LV dysfunction by TEE.   He's done well since that time until recently. Over the last several months has noticed fatigue, loss of appetite and 30lb weight loss, some of which was unintentional related to the poor appetite. He's been having stomach discomfort issues. He also seems to have been more susceptible this year to URIs/bronchitis. He saw his PCP in February and was found to have low BP so losartan was cut in half to 25mg . He was advised to f/u cardiology for workup of the above. He recalls being told he was in afib at that visit. He does not acutely feel the afib but has noticed the same sluggishness for at least several weeks' time. He's not sure if he's gone in and out of it at all, but has  noticed a definite change in energy from February onward. He continues to exercise but notices his exercise tolerance is down with increased dyspnea. He's also had some orthopnea. He has trace sockline edema. No chest pain, except during times of severe coughing with recent URIs (this also produced near-syncope feelings). He denies any acute complaint today, just generally sluggish (continues to exercise). He is accompanied by his wife and daughter who is expecting her second child.  Past Medical History:  Diagnosis Date  . Central retinal vein occlusion   . ED (erectile dysfunction)   . H/O cardiac catheterization 2002 - normal cors  . HTN (hypertension)   . Hyperlipidemia   . Hypokalemia   . Leg edema   . LV dysfunction    a. EF appeared mildly depressed by TEE 08/2015, no % given.  . Mini stroke [facility])    a. ? Age 5 at time of central retinal vein occlusion - lost vision in eye  . PAF (paroxysmal atrial fibrillation) ([place])    a. Dx 06/2015, s/p TEE/DCCV 08/2015.  [name] Pericarditis 2010  . Syncope    Hx of thought to be due to orthostatic hypotension    Past Surgical History:  Procedure Laterality Date  . CARDIAC CATHETERIZATION  2002  . CARDIOVERSION N/A 09/08/2015   Procedure: CARDIOVERSION;  Surgeon: Fay Records, MD;  Location: [facility];  Service: Cardiovascular;  Laterality: N/A;  . HAND SURGERY  2003  . KNEE SURGERY    .  NASAL SINUS SURGERY  2003   x2. Scar tissue build up and had to recreate his tear duct  . PERICARDIECTOMY    . TEE WITHOUT CARDIOVERSION N/A 09/08/2015   Procedure: TRANSESOPHAGEAL ECHOCARDIOGRAM (TEE);  Surgeon: Fay Records, MD;  Location: [facility] ENDOSCOPY;  Service: Cardiovascular;  Laterality: N/A;    Current Medications: Current Meds  Medication Sig  . cetirizine (ZYRTEC ALLERGY) 10 MG tablet Take 10 mg by mouth daily.  [name] ELIQUIS 5 MG TABS tablet TAKE 1 TABLET BY MOUTH TWICE A DAY  . fluticasone (FLONASE) 50 MCG/ACT nasal spray Place 2 sprays into both  nostrils daily.  . hydrocortisone cream 1 % Apply 1 application topically 3 (three) times daily as needed for itching (skin irritation).  [name] losartan (COZAAR) 25 MG tablet Take 25 mg by mouth daily.  . Nebivolol HCl (BYSTOLIC) 20 MG TABS Take 20 mg by mouth daily.  [name] OVER THE COUNTER MEDICATION Take 3 capsules by mouth as needed (when working out). VOLCANO pre workout 1 mg Vitamin b 6 Nitric oxide 1520 mg L-citruline agmapre Agmatine sulfate L-norvaline Creatine strength complex  . rosuvastatin (CRESTOR) 5 MG tablet Take 5 mg by mouth daily.  [name] spironolactone (ALDACTONE) 25 MG tablet TAKE 1 TABLET (25 MG TOTAL) BY MOUTH DAILY.  . tadalafil (CIALIS) 20 MG tablet Take 1 tablet (20 mg total) by mouth daily as needed.    Allergies:   Eggs or egg-derived products and Penicillins   Social History   Socioeconomic History  . Marital status: Married    Spouse name: None  . Number of children: None  . Years of education: None  . Highest education level: None  Social Needs  . Financial resource strain: None  . Food insecurity - worry: None  . Food insecurity - inability: None  . Transportation needs - medical: None  . Transportation needs - non-medical: None  Occupational History  . None  Tobacco Use  . Smoking status: Never Smoker  . Smokeless tobacco: Never Used  Substance and Sexual Activity  . Alcohol use: Yes    Comment: 2-3 Times a Week Wine with Meals  . Drug use: None  . Sexual activity: None  Other Topics Concern  . None  Social History Narrative  . None     Family History:  Family History  Problem Relation Age of Onset  . Hypertension Mother   . Diabetes Mother   . Cancer Mother   . Atrial fibrillation Father   . Heart disease Father        had pacemaker  . Hypertension Father   . Diabetes Father    ROS:   Please see the history of present illness.  All other systems are reviewed and otherwise negative.    PHYSICAL EXAM:   VS:  BP (!) 94/56 (BP Location:  Left Arm, Patient Position: Sitting, Cuff Size: Normal)   Pulse 69   Ht 6\' 1"  (1.854 m)   Wt 198 lb 3.2 oz (89.9 kg)   SpO2 96%   BMI 26.15 kg/m   BMI: Body mass index is 26.15 kg/m. GEN: Well nourished, well developed WM, in no acute distress  HEENT: normocephalic, atraumatic Neck: no JVD, carotid bruits, or masses Cardiac: distant heart sounds, irregular, rate controlled, no murmurs, rubs, or gallops, trace sockline edema  Respiratory:  clear to auscultation bilaterally, normal work of breathing GI: soft, nontender, nondistended, + BS MS: no deformity or atrophy  Skin: warm and dry, no rash Neuro:  Alert  and Oriented x 3, Strength and sensation are intact, follows commands Psych: euthymic mood, full affect  Wt Readings from Last 3 Encounters:  05/31/17 198 lb 3.2 oz (89.9 kg)  04/08/16 208 lb 1.9 oz (94.4 kg)  01/30/16 214 lb 9.6 oz (97.3 kg)      Studies/Labs Reviewed:   EKG:  EKG was ordered today and personally reviewed by me and demonstrates atrial fib 69bpm no acute ST-T changes. Prior QTc when in NSR was 442ms.  Recent Labs: No results found for requested labs within last 8760 hours.   Lipid Panel    Component Value Date/Time   CHOL 173 04/08/2016 1640   TRIG 220 (H) 04/08/2016 1640   HDL 39 (L) 04/08/2016 1640   CHOLHDL 4.4 04/08/2016 1640   CHOLHDL 4 02/22/2011 0914   VLDL 17.4 02/22/2011 0914   LDLCALC 90 04/08/2016 1640    Additional studies/ records that were reviewed today include: Summarized above.    ASSESSMENT & PLAN:   1. Persistent atrial fibrillation/dyspnea/fatigue - s/p TEE/DCCV 08/2015, with recurrence at least back to February 2019 per patient report. Not clear if he is in/out but he has noticed a definite change in energy since that time. He also has nondescript symptoms of weight loss and poor appetite that I would be concerned are not related to his afib but instead a systemic process that could be driving the recurrence of atrial fib.  Will update his BMET, CBC, Mg, TSH and BNP. Will also update CXR given his recent "susceptibility" to URIs. Will repeat echocardiogram. Given his history of LV dysfunction by TEE, this may exclude him from certain antiarrhythmics. His young age makes amiodarone less ideal. Prior QTc in NSR was 419ms which would preclude Tikosyn. Will have him f/u with Afib clinic or Dr. Rayann Heman next available after echo completed to discuss further options including DCCV versus antiarrhythmic initiation followed by DCCV. I also encouraged him to discuss his unintentional weight loss with his PCP with regard to updating other age-appropriate malignancy screenings.  2. Essential HTN with hypotension - stop losartan. He's only on a low dose so I'm not really sure it will make a big difference. I told him if BP remains low, he can cut spironolactone in half and keep [place] updated on how he is feeling. 3. Hyperlipidemia - followed by PCP.  4. LV dysfunction - update echocardiogram. Check BNP given dyspnea/orthopnea. Lungs are clear today.  Disposition: F/u with afib clinic or Dr. Rayann Heman for evaluation of recurrent persistent atrial fib.   Medication Adjustments/Labs and Tests Ordered: Current medicines are reviewed at length with the patient today.  Concerns regarding medicines are outlined above. Medication changes, Labs and Tests ordered today are summarized above and listed in the Patient Instructions accessible in Encounters.   Signed, Charlie Pitter, PA-C  05/31/2017 9:44 AM    [address], [place], [place]  [address] Phone: [phone]; Fax: [phone]

## 2017-05-31 ENCOUNTER — Ambulatory Visit: Payer: 59 | Admitting: Physician Assistant

## 2017-05-31 ENCOUNTER — Ambulatory Visit
Admission: RE | Admit: 2017-05-31 | Discharge: 2017-05-31 | Disposition: A | Discharge status: Home / Self Care | Payer: 59 | Source: Ambulatory Visit | Attending: Physician Assistant | Admitting: Physician Assistant

## 2017-05-31 ENCOUNTER — Encounter: Payer: Self-pay | Admitting: Physician Assistant

## 2017-05-31 VITALS — BP 94/56 | HR 69 | Ht 73.0 in | Wt 198.2 lb

## 2017-05-31 DIAGNOSIS — I519 Heart disease, unspecified: Secondary | ICD-10-CM | POA: Diagnosis not present

## 2017-05-31 DIAGNOSIS — R0602 Shortness of breath: Secondary | ICD-10-CM | POA: Diagnosis not present

## 2017-05-31 DIAGNOSIS — I959 Hypotension, unspecified: Secondary | ICD-10-CM

## 2017-05-31 DIAGNOSIS — I4819 Other persistent atrial fibrillation: Secondary | ICD-10-CM

## 2017-05-31 DIAGNOSIS — E785 Hyperlipidemia, unspecified: Secondary | ICD-10-CM

## 2017-05-31 DIAGNOSIS — I10 Essential (primary) hypertension: Secondary | ICD-10-CM | POA: Diagnosis not present

## 2017-05-31 DIAGNOSIS — I481 Persistent atrial fibrillation: Secondary | ICD-10-CM

## 2017-05-31 LAB — PRO B NATRIURETIC PEPTIDE: NT-Pro BNP: 1280 pg/mL — ABNORMAL HIGH (ref 0–210)

## 2017-05-31 LAB — CBC
HEMOGLOBIN: 16.9 g/dL (ref 13.0–17.7)
Hematocrit: 48.7 % (ref 37.5–51.0)
MCH: 30.4 pg (ref 26.6–33.0)
MCHC: 34.7 g/dL (ref 31.5–35.7)
MCV: 88 fL (ref 79–97)
PLATELETS: 206 10*3/uL (ref 150–379)
RBC: 5.56 x10E6/uL (ref 4.14–5.80)
RDW: 14.1 % (ref 12.3–15.4)
WBC: 5.5 10*3/uL (ref 3.4–10.8)

## 2017-05-31 LAB — BASIC METABOLIC PANEL
BUN/Creatinine Ratio: 16 (ref 10–24)
BUN: 19 mg/dL (ref 8–27)
CO2: 23 mmol/L (ref 20–29)
CREATININE: 1.21 mg/dL (ref 0.76–1.27)
Calcium: 9.2 mg/dL (ref 8.6–10.2)
Chloride: 103 mmol/L (ref 96–106)
GFR, EST AFRICAN AMERICAN: 73 mL/min/{1.73_m2} (ref >59)
GFR, EST NON AFRICAN AMERICAN: 63 mL/min/{1.73_m2} (ref >59)
Glucose: 93 mg/dL (ref 65–99)
POTASSIUM: 4.6 mmol/L (ref 3.5–5.2)
SODIUM: 139 mmol/L (ref 134–144)

## 2017-05-31 LAB — MAGNESIUM: Magnesium: 1.9 mg/dL (ref 1.6–2.3)

## 2017-05-31 LAB — TSH: TSH: 3.39 u[IU]/mL (ref 0.450–4.500)

## 2017-05-31 NOTE — Patient Instructions (Addendum)
Medication Instructions:  Your physician has recommended you make the following change in your medication:  1.  STOP the Losartan 2.  IF YOUR BLOOD PRESSURE REMAINS LOW, CUT THE SPIRONOLACTONE IN 1/2  Labwork: TODAY:  BMET, CBC, MAGNESIUM, TSH, & PRO BNP  Testing/Procedures: Your physician has requested that you have an echocardiogram. Echocardiography is a painless test that uses sound waves to create images of your heart. It provides your doctor with information about the size and shape of your heart and how well your heart's chambers and valves are working. This procedure takes approximately one hour. There are no restrictions for this procedure.   A chest x-ray takes a picture of the organs and structures inside the chest, including the heart, lungs, and blood vessels. This test can show several things, including, whether the heart is enlarges; whether fluid is building up in the lungs; and whether pacemaker / defibrillator leads are still in place.  Follow-Up: Your physician recommends that you schedule a follow-up appointment in: WITH DR. ALLRED OR DONNA CARROLL IN THE AFIB CLINIC AFTER THE ECHO IS COMPLETED   Any Other Special Instructions Will Be Listed Below (If Applicable). Echocardiogram An echocardiogram, or echocardiography, uses sound waves (ultrasound) to produce an image of your heart. The echocardiogram is simple, painless, obtained within a short period of time, and offers valuable information to your health care provider. The images from an echocardiogram can provide information such as:  Evidence of coronary artery disease (CAD).  Heart size.  Heart muscle function.  Heart valve function.  Aneurysm detection.  Evidence of a past heart attack.  Fluid buildup around the heart.  Heart muscle thickening.  Assess heart valve function.  Tell a health care provider about:  Any allergies you have.  All medicines you are taking, including vitamins, herbs, eye  drops, creams, and over-the-counter medicines.  Any problems you or family members have had with anesthetic medicines.  Any blood disorders you have.  Any surgeries you have had.  Any medical conditions you have.  Whether you are pregnant or may be pregnant. What happens before the procedure? No special preparation is needed. Eat and drink normally. What happens during the procedure?  In order to produce an image of your heart, gel will be applied to your chest and a wand-like tool (transducer) will be moved over your chest. The gel will help transmit the sound waves from the transducer. The sound waves will harmlessly bounce off your heart to allow the heart images to be captured in real-time motion. These images will then be recorded.  You may need an IV to receive a medicine that improves the quality of the pictures. What happens after the procedure? You may return to your normal schedule including diet, activities, and medicines, unless your health care provider tells you otherwise. This information is not intended to replace advice given to you by your health care provider. Make sure you discuss any questions you have with your health care provider. Document Released: 02/27/2000 Document Revised: 10/18/2015 Document Reviewed: 11/06/2012 Elsevier Interactive Patient Education  2017 [place] are tests that create pictures of the inside of your body using radiation. Different body parts absorb different amounts of radiation, which show up on the X-ray pictures in shades of black, gray, and white. X-rays are used to look for many health conditions, including broken bones, lung problems, and some types of cancer. Tell a health care provider about:  Any allergies you have.  All medicines you are taking, including vitamins, herbs, eye drops, creams, and over-the-counter medicines.  Previous surgeries you have had.  Medical conditions you have. What are the  risks? Getting an X-ray is a safe procedure. What happens before the procedure?  Tell the X-ray technician if you are pregnant or might be pregnant.  You may be asked to wear a protective lead apron to hide parts of your body from the X-ray.  You usually will need to undress whatever part of your body needs the X-ray. You will be given a hospital gown to wear, if needed.  You may need to remove your glasses, jewelry, and other metal objects. What happens during the procedure?  The X-ray machine creates a picture by using a tiny burst of radiation. It is painless.  You may need to have several pictures taken at different angles.  You will need to try to be as still as you can during the examination to get the best possible images. What happens after the procedure?  You will be able to resume your normal activities.  The X-ray will be examined by your health care provider or a radiology specialist.  It is your responsibility to get your test results. Ask your health care provider when to expect your results and how to get them. This information is not intended to replace advice given to you by your health care provider. Make sure you discuss any questions you have with your health care provider. Document Released: 03/01/2005 Document Revised: 10/30/2015 Document Reviewed: 04/25/2013 Elsevier Interactive Patient Education  [employer].   If you need a refill on your cardiac medications before your next appointment, please call your pharmacy.

## 2017-06-01 ENCOUNTER — Telehealth: Payer: Self-pay | Admitting: Physician Assistant

## 2017-06-01 NOTE — Telephone Encounter (Signed)
-----   Message from Charlie Pitter, [place] sent at 05/31/2017  5:31 PM EDT ----- Please call patient. BMET, CBC, Mg are normal. TSH is pending (will call if abnormal). His BNP suggests he may have some component of heart failure with his recurrent atrial fib. His blood pressure is currently too low for diuresis. Please see if we can move up echo. Dr. Acie Fredrickson agrees that he is concerned there is something underlying that is causing his weight loss and stomach issues. Dr. Acie Fredrickson recommends he discuss further testing with his PCP, I.e. Perhaps an abdominal scan to make sure there are no underlying tumors or infection which could be causing the weight loss. His CXR also shows some markings at the left lung base - possible early pneumonia - but his white blood cell count is not elevated and he has not had fevers or cough. I want him also to touch base with his PCP who has recently seen him for his URI visits (I do not have those records) to decide if they want to consider empirically treating this as pneumonia or hold off, pending treatment of atrial fib. Dr. Acie Fredrickson really recommends primary care assessment of the weight loss prior to cardioversion, in case there is further workup that needs to be done that would require holding of anticoagulation.   Dayna Dunn PA-C

## 2017-06-01 NOTE — Telephone Encounter (Signed)
Returned pt's call.  See result note.  

## 2017-06-01 NOTE — Telephone Encounter (Signed)
Patient calling, states that he is returning call for lab results.

## 2017-06-03 ENCOUNTER — Ambulatory Visit (HOSPITAL_COMMUNITY): Payer: 59 | Attending: Cardiovascular Disease

## 2017-06-03 ENCOUNTER — Telehealth: Payer: Self-pay | Admitting: Cardiovascular Disease

## 2017-06-03 ENCOUNTER — Other Ambulatory Visit: Payer: Self-pay

## 2017-06-03 DIAGNOSIS — Z8673 Personal history of transient ischemic attack (TIA), and cerebral infarction without residual deficits: Secondary | ICD-10-CM | POA: Insufficient documentation

## 2017-06-03 DIAGNOSIS — I10 Essential (primary) hypertension: Secondary | ICD-10-CM | POA: Insufficient documentation

## 2017-06-03 DIAGNOSIS — Z8249 Family history of ischemic heart disease and other diseases of the circulatory system: Secondary | ICD-10-CM | POA: Diagnosis not present

## 2017-06-03 DIAGNOSIS — I481 Persistent atrial fibrillation: Secondary | ICD-10-CM

## 2017-06-03 DIAGNOSIS — E785 Hyperlipidemia, unspecified: Secondary | ICD-10-CM | POA: Diagnosis not present

## 2017-06-03 DIAGNOSIS — I319 Disease of pericardium, unspecified: Secondary | ICD-10-CM | POA: Diagnosis not present

## 2017-06-03 DIAGNOSIS — R609 Edema, unspecified: Secondary | ICD-10-CM | POA: Insufficient documentation

## 2017-06-03 DIAGNOSIS — I4819 Other persistent atrial fibrillation: Secondary | ICD-10-CM

## 2017-06-03 NOTE — Telephone Encounter (Signed)
Spoke with patient who has already received the results of his most recent lab work.  He was calling about his echo results.  This testing was done today and the results are not available at this point.  Pt is aware he will be called with the results once they have been reviewed by the MD.  He was grateful for the call.

## 2017-06-03 NOTE — Telephone Encounter (Signed)
New Message:    Pt calling for results of labs and echo

## 2017-06-06 ENCOUNTER — Other Ambulatory Visit: Payer: Self-pay | Admitting: Cardiovascular Disease

## 2017-06-06 ENCOUNTER — Telehealth: Payer: Self-pay | Admitting: Physician Assistant

## 2017-06-06 MED ORDER — SPIRONOLACTONE 25 MG PO TABS: 12.5000 mg | ORAL_TABLET | Freq: Every day | ORAL | 3 refills | Status: DC

## 2017-06-06 NOTE — Telephone Encounter (Signed)
Called patient to review echo results. I reviewed the advice from Melina Copa, [place] that is on the echo report. Patient is already weighing himself daily and he will continue to do so and will record so that he can monitor for weight gain. He states he eats a healthy low sodium diet and exercises daily. The patient had multiple questions about a fib and systolic HF. I answered questions to his satisfaction. We also discussed how the pro BNP level (>1200 pg/mL on 3/19) is linked to heart failure. He states his BP is averaging 94/63 mmHg since he stopped Losartan and decreased spironolactone to 12.5 mg daily. He states his PCP is not overly concerned about weight loss of 30 lbs in 2 years but that he did say that he would order a CT scan of the patient's stomach. I encouraged patient to continue f/u for weight loss with PCP. He has an appointment with Roderic Palau, NP in A Fib clinic on 4/2 and I encouraged him to keep this appointment. He is aware that medication management is going to be complicated due to his hypotension. I advised that I will forward the message to Dr. Acie Fredrickson for awareness and advice. Patient verbalized understanding and thanked me for the call.

## 2017-06-06 NOTE — Telephone Encounter (Signed)
Follow Up:    Pt wants to know if his Echo results are ready from Friday please?

## 2017-06-06 NOTE — Telephone Encounter (Signed)
In addition to previous note, patient reports that he is very fatigued and SOB with a fib and has always tried to live a healthy lifestyle so that he can enjoy these years of older age.

## 2017-06-06 NOTE — Telephone Encounter (Signed)
Agree with referral to the Afib clinic and agree with other aspects of the phone notes by Army Melia, RN

## 2017-06-07 ENCOUNTER — Other Ambulatory Visit (HOSPITAL_COMMUNITY): Payer: 59

## 2017-06-07 ENCOUNTER — Other Ambulatory Visit: Payer: Self-pay | Admitting: *Deleted

## 2017-06-08 ENCOUNTER — Other Ambulatory Visit: Payer: Self-pay | Admitting: Family Medicine

## 2017-06-08 DIAGNOSIS — R634 Abnormal weight loss: Secondary | ICD-10-CM

## 2017-06-14 ENCOUNTER — Encounter (HOSPITAL_COMMUNITY): Payer: Self-pay | Admitting: Nurse Practitioner

## 2017-06-14 ENCOUNTER — Ambulatory Visit (HOSPITAL_COMMUNITY)
Admission: RE | Admit: 2017-06-14 | Discharge: 2017-06-14 | Disposition: A | Discharge status: Home / Self Care | Payer: 59 | Source: Ambulatory Visit | Attending: Nurse Practitioner | Admitting: Nurse Practitioner

## 2017-06-14 VITALS — BP 102/70 | HR 91 | Ht 73.0 in | Wt 193.4 lb

## 2017-06-14 DIAGNOSIS — R55 Syncope and collapse: Secondary | ICD-10-CM | POA: Insufficient documentation

## 2017-06-14 DIAGNOSIS — Z880 Allergy status to penicillin: Secondary | ICD-10-CM | POA: Insufficient documentation

## 2017-06-14 DIAGNOSIS — Z9889 Other specified postprocedural states: Secondary | ICD-10-CM | POA: Diagnosis not present

## 2017-06-14 DIAGNOSIS — Z79899 Other long term (current) drug therapy: Secondary | ICD-10-CM | POA: Diagnosis not present

## 2017-06-14 DIAGNOSIS — I10 Essential (primary) hypertension: Secondary | ICD-10-CM | POA: Insufficient documentation

## 2017-06-14 DIAGNOSIS — E785 Hyperlipidemia, unspecified: Secondary | ICD-10-CM | POA: Insufficient documentation

## 2017-06-14 DIAGNOSIS — Z8673 Personal history of transient ischemic attack (TIA), and cerebral infarction without residual deficits: Secondary | ICD-10-CM | POA: Diagnosis not present

## 2017-06-14 DIAGNOSIS — I481 Persistent atrial fibrillation: Secondary | ICD-10-CM | POA: Diagnosis not present

## 2017-06-14 DIAGNOSIS — Z8249 Family history of ischemic heart disease and other diseases of the circulatory system: Secondary | ICD-10-CM | POA: Insufficient documentation

## 2017-06-14 DIAGNOSIS — R634 Abnormal weight loss: Secondary | ICD-10-CM | POA: Insufficient documentation

## 2017-06-14 DIAGNOSIS — Z7901 Long term (current) use of anticoagulants: Secondary | ICD-10-CM | POA: Diagnosis not present

## 2017-06-14 DIAGNOSIS — Z833 Family history of diabetes mellitus: Secondary | ICD-10-CM | POA: Diagnosis not present

## 2017-06-14 DIAGNOSIS — I480 Paroxysmal atrial fibrillation: Secondary | ICD-10-CM | POA: Insufficient documentation

## 2017-06-14 DIAGNOSIS — Z809 Family history of malignant neoplasm, unspecified: Secondary | ICD-10-CM | POA: Insufficient documentation

## 2017-06-14 DIAGNOSIS — E876 Hypokalemia: Secondary | ICD-10-CM | POA: Diagnosis not present

## 2017-06-14 DIAGNOSIS — I4819 Other persistent atrial fibrillation: Secondary | ICD-10-CM

## 2017-06-14 LAB — COMPREHENSIVE METABOLIC PANEL
ALT: 30 U/L (ref 17–63)
AST: 25 U/L (ref 15–41)
Albumin: 3.7 g/dL (ref 3.5–5.0)
Alkaline Phosphatase: 58 U/L (ref 38–126)
Anion gap: 9 (ref 5–15)
BUN: 25 mg/dL — ABNORMAL HIGH (ref 6–20)
CHLORIDE: 106 mmol/L (ref 101–111)
CO2: 22 mmol/L (ref 22–32)
Calcium: 9.3 mg/dL (ref 8.9–10.3)
Creatinine, Ser: 1.17 mg/dL (ref 0.61–1.24)
Glucose, Bld: 96 mg/dL (ref 65–99)
POTASSIUM: 4.1 mmol/L (ref 3.5–5.1)
Sodium: 137 mmol/L (ref 135–145)
TOTAL PROTEIN: 6.7 g/dL (ref 6.5–8.1)
Total Bilirubin: 1.2 mg/dL (ref 0.3–1.2)

## 2017-06-14 LAB — CBC
HCT: 49.4 % (ref 39.0–52.0)
Hemoglobin: 16.4 g/dL (ref 13.0–17.0)
MCH: 30.1 pg (ref 26.0–34.0)
MCHC: 33.2 g/dL (ref 30.0–36.0)
MCV: 90.6 fL (ref 78.0–100.0)
Platelets: 300 10*3/uL (ref 150–400)
RBC: 5.45 MIL/uL (ref 4.22–5.81)
RDW: 13.2 % (ref 11.5–15.5)
WBC: 6.7 10*3/uL (ref 4.0–10.5)

## 2017-06-14 LAB — BRAIN NATRIURETIC PEPTIDE: B NATRIURETIC PEPTIDE 5: 101.2 pg/mL — AB (ref 0.0–100.0)

## 2017-06-14 NOTE — Patient Instructions (Addendum)
Cardioversion scheduled for Tuesday, April 16th  - Arrive at the [facility] and go to admitting at 10:30AM  -Do not eat or drink anything after midnight the night prior to your procedure.  - Do not miss any doses of Eliquis  - Take all your morning medications with a sip of water prior to arrival.  - You will not be able to drive home after your procedure.

## 2017-06-15 ENCOUNTER — Other Ambulatory Visit (HOSPITAL_COMMUNITY): Payer: Self-pay | Admitting: *Deleted

## 2017-06-15 DIAGNOSIS — I4819 Other persistent atrial fibrillation: Secondary | ICD-10-CM

## 2017-06-15 NOTE — H&P (View-Only) (Signed)
Primary Care Physician: Lawerance Cruel, MD Referring Physician: Sharrell Ku, PA   Terry [name] is a 67 y.o. male with a h/o atrial fibrillation, first occurrence in 2017 with return to [place] with cardioversion, with return to afib in February with URI. He saw Sharrell Ku last week. BNP at that time was 1200, echo showed EF at 45 % . Pt cut out all salt,lost 5 lbs since that visit. He reports that he is minimally symptomatic with afib, he is rate controlled, chronically on Bystolic.[name] Avid exerciser, usually working out 5x a week. He had stopped his exercise routine since the URI and then return of afib. There was concern with Dana's visit that pt has lost 30 lbs over the last year. He states that's he has always been able to drop weight easily, there is an Abd U/S scheduled by PCP to f/u weight loss. He is accompanied by his daughter and wife today. He feels that he has recovered now from URI. He also took decongestants during that time.  Today, he denies symptoms of palpitations, chest pain, shortness of breath, orthopnea, PND, lower extremity edema, dizziness, presyncope, syncope, or neurologic sequela. The patient is tolerating medications without difficulties and is otherwise without complaint today.   Past Medical History:  Diagnosis Date  . Central retinal vein occlusion   . ED (erectile dysfunction)   . H/O cardiac catheterization 2002 - normal cors  . HTN (hypertension)   . Hyperlipidemia   . Hypokalemia   . Leg edema   . LV dysfunction    a. EF appeared mildly depressed by TEE 08/2015, no % given.  . Mini stroke [facility])    a. ? Age 64 at time of central retinal vein occlusion - lost vision in eye  . PAF (paroxysmal atrial fibrillation) ([place])    a. Dx 06/2015, s/p TEE/DCCV 08/2015.  [name] Pericarditis 2010  . Syncope    Hx of thought to be due to orthostatic hypotension   Past Surgical History:  Procedure Laterality Date  . CARDIAC CATHETERIZATION  2002  . CARDIOVERSION N/A 09/08/2015     Procedure: CARDIOVERSION;  Surgeon: Fay Records, MD;  Location: [place];  Service: Cardiovascular;  Laterality: N/A;  . HAND SURGERY  2003  . KNEE SURGERY    . NASAL SINUS SURGERY  2003   x2. Scar tissue build up and had to recreate his tear duct  . PERICARDIECTOMY    . TEE WITHOUT CARDIOVERSION N/A 09/08/2015   Procedure: TRANSESOPHAGEAL ECHOCARDIOGRAM (TEE);  Surgeon: Fay Records, MD;  [facility] ENDOSCOPY;  Service: Cardiovascular;  Laterality: N/A;    Current Outpatient Medications  Medication Sig Dispense Refill  . cetirizine (ZYRTEC ALLERGY) 10 MG tablet Take 10 mg by mouth daily.    [name] ELIQUIS 5 MG TABS tablet TAKE 1 TABLET BY MOUTH TWICE A DAY 60 tablet 1  . fluticasone (FLONASE) 50 MCG/ACT nasal spray Place 2 sprays into both nostrils daily.    . hydrocortisone cream 1 % Apply 1 application topically 3 (three) times daily as needed for itching (skin irritation). 30 g 0  . Nebivolol HCl (BYSTOLIC) 20 MG TABS Take 20 mg by mouth daily.    . rosuvastatin (CRESTOR) 5 MG tablet Take 5 mg by mouth daily.    [name] spironolactone (ALDACTONE) 25 MG tablet Take 0.5 tablets (12.5 mg total) by mouth daily. 45 tablet 3  . tadalafil (CIALIS) 20 MG tablet Take 1 tablet (20 mg total) by mouth daily as needed.  15 tablet 5  . potassium chloride SA (K-DUR,KLOR-CON) 20 MEQ tablet Take 1 tablet (20 mEq total) by mouth daily. (Patient not taking: Reported on 06/14/2017) 90 tablet 3   No current facility-administered medications for this encounter.     Allergies  Allergen Reactions  . Penicillins Other (See Comments)    Childhood reaction    Social History   Socioeconomic History  . Marital status: Married    Spouse name: Not on file  . Number of children: Not on file  . Years of education: Not on file  . Highest education level: Not on file  Occupational History  . Not on file  Social Needs  . Financial resource strain: Not on file  . Food insecurity:    Worry: Not on file     Inability: Not on file  . Transportation needs:    Medical: Not on file    Non-medical: Not on file  Tobacco Use  . Smoking status: Never Smoker  . Smokeless tobacco: Never Used  Substance and Sexual Activity  . Alcohol use: Yes    Comment: 2-3 Times a Week Wine with Meals  . Drug use: Not on file  . Sexual activity: Not on file  Lifestyle  . Physical activity:    Days per week: Not on file    Minutes per session: Not on file  . Stress: Not on file  Relationships  . Social connections:    Talks on phone: Not on file    Gets together: Not on file    Attends religious service: Not on file    Active member of club or organization: Not on file    Attends meetings of clubs or organizations: Not on file    Relationship status: Not on file  . Intimate partner violence:    Fear of current or ex partner: Not on file    Emotionally abused: Not on file    Physically abused: Not on file    Forced sexual activity: Not on file  Other Topics Concern  . Not on file  Social History Narrative  . Not on file    Family History  Problem Relation Age of Onset  . Hypertension Mother   . Diabetes Mother   . Cancer Mother   . Atrial fibrillation Father   . Heart disease Father        had pacemaker  . Hypertension Father   . Diabetes Father     ROS- All systems are reviewed and negative except as per the HPI above  Physical Exam: Vitals:   06/14/17 1522  BP: 102/70  Pulse: 91  Weight: 193 lb 6.4 oz (87.7 kg)  Height: 6\' 1"  (1.854 m)   Wt Readings from Last 3 Encounters:  06/14/17 193 lb 6.4 oz (87.7 kg)  05/31/17 198 lb 3.2 oz (89.9 kg)  04/08/16 208 lb 1.9 oz (94.4 kg)    Labs: Lab Results  Component Value Date   NA 137 06/14/2017   K 4.1 06/14/2017   CL 106 06/14/2017   CO2 22 06/14/2017   GLUCOSE 96 06/14/2017   BUN 25 (H) 06/14/2017   CREATININE 1.17 06/14/2017   CALCIUM 9.3 06/14/2017   MG 1.9 05/31/2017   Lab Results  Component Value Date   INR 1.0  04/25/2008   Lab Results  Component Value Date   CHOL 173 04/08/2016   HDL 39 (L) 04/08/2016   LDLCALC 90 04/08/2016   TRIG 220 (H) 04/08/2016  GEN- The patient is well appearing, alert and oriented x 3 today.   Head- normocephalic, atraumatic Eyes-  Sclera clear, conjunctiva pink Ears- hearing intact Oropharynx- clear Neck- supple, no JVP Lymph- no cervical lymphadenopathy Lungs- Clear to ausculation bilaterally, normal work of breathing Heart- irregular rate and rhythm, no murmurs, rubs or gallops, PMI not laterally displaced GI- soft, NT, ND, + BS Extremities- no clubbing, cyanosis, or edema MS- no significant deformity or atrophy Skin- no rash or lesion Psych- euthymic mood, full affect Neuro- strength and sensation are intact  EKG- afib at 91 bpm, qrs int 74 ms, qtc 435 ms Echo-Study Conclusions  - Left ventricle: The cavity size was normal. Wall thickness was   normal. Systolic function was mildly reduced. The estimated   ejection fraction was in the range of 45% to 50%. Mild diffuse   hypokinesis with no identifiable regional variations. - Ventricular septum: Septal motion showed paradox. - Left atrium: The atrium was mildly dilated. - Right atrium: The atrium was mildly dilated.  CXR-FINDINGS: 05/31/17 Minimally prominent markings are present at the left lung base and developing pneumonia would be a definite consideration. Slight volume loss at the left lung base also is noted. No pleural effusion is seen. The right lung is clear. Mediastinal and hilar contours are unremarkable and the heart is within upper limits of normal in size. No bony abnormality is seen.  IMPRESSION: Prominent markings at the left lung base. Possible early pneumonia. Recommend follow-up.     Assessment and Plan: 1. Persistent  afib Became persistent in the setting of URI in February Up til then had not had any issues with afib I feel now that URI has cleared, it is  reasonable to try cardioversion alone, since he had very good success with first cardioversion holding in SR  IF ERAF, then can discuss antiarrythmic's He did miss a couple of doses of anticoagulation, so will schedule TEE guided cardioversion for 4/16, reminded not to miss any further doses  Continue eliquis 5 mg bid for chadsvasc score of at least 4 Continue Bystolic 20 mg daily Bmet/cbc/BNP today, BNP earlier elevated around 1200 has improved to 101.2 with pt avoiding salt and has lost 5 lbs (intentional) He will f/u with plans with Abd U/S for concerns re weight loss with PCP  F/u here one week after cardioversion  Butch Penny C. Mauricio Dahlen, [facility] [address] [place], [place] [address] [phone]

## 2017-06-15 NOTE — Progress Notes (Signed)
Primary Care Physician: Lawerance Cruel, MD Referring Physician: Sharrell Ku, PA   Terry [name] is a 67 y.o. male with a h/o atrial fibrillation, first occurrence in 2017 with return to [place] with cardioversion, with return to afib in February with URI. He saw Sharrell Ku last week. BNP at that time was 1200, echo showed EF at 45 % . Pt cut out all salt,lost 5 lbs since that visit. He reports that he is minimally symptomatic with afib, he is rate controlled, chronically on Bystolic.[name] Avid exerciser, usually working out 5x a week. He had stopped his exercise routine since the URI and then return of afib. There was concern with Dana's visit that pt has lost 30 lbs over the last year. He states that's he has always been able to drop weight easily, there is an Abd U/S scheduled by PCP to f/u weight loss. He is accompanied by his daughter and wife today. He feels that he has recovered now from URI. He also took decongestants during that time.  Today, he denies symptoms of palpitations, chest pain, shortness of breath, orthopnea, PND, lower extremity edema, dizziness, presyncope, syncope, or neurologic sequela. The patient is tolerating medications without difficulties and is otherwise without complaint today.   Past Medical History:  Diagnosis Date  . Central retinal vein occlusion   . ED (erectile dysfunction)   . H/O cardiac catheterization 2002 - normal cors  . HTN (hypertension)   . Hyperlipidemia   . Hypokalemia   . Leg edema   . LV dysfunction    a. EF appeared mildly depressed by TEE 08/2015, no % given.  . Mini stroke [facility])    a. ? Age 82 at time of central retinal vein occlusion - lost vision in eye  . PAF (paroxysmal atrial fibrillation) ([place])    a. Dx 06/2015, s/p TEE/DCCV 08/2015.  [name] Pericarditis 2010  . Syncope    Hx of thought to be due to orthostatic hypotension   Past Surgical History:  Procedure Laterality Date  . CARDIAC CATHETERIZATION  2002  . CARDIOVERSION N/A 09/08/2015     Procedure: CARDIOVERSION;  Surgeon: Fay Records, MD;  Location: [place];  Service: Cardiovascular;  Laterality: N/A;  . HAND SURGERY  2003  . KNEE SURGERY    . NASAL SINUS SURGERY  2003   x2. Scar tissue build up and had to recreate his tear duct  . PERICARDIECTOMY    . TEE WITHOUT CARDIOVERSION N/A 09/08/2015   Procedure: TRANSESOPHAGEAL ECHOCARDIOGRAM (TEE);  Surgeon: Fay Records, MD;  Location: [facility] ENDOSCOPY;  Service: Cardiovascular;  Laterality: N/A;    Current Outpatient Medications  Medication Sig Dispense Refill  . cetirizine (ZYRTEC ALLERGY) 10 MG tablet Take 10 mg by mouth daily.    [name] ELIQUIS 5 MG TABS tablet TAKE 1 TABLET BY MOUTH TWICE A DAY 60 tablet 1  . fluticasone (FLONASE) 50 MCG/ACT nasal spray Place 2 sprays into both nostrils daily.    . hydrocortisone cream 1 % Apply 1 application topically 3 (three) times daily as needed for itching (skin irritation). 30 g 0  . Nebivolol HCl (BYSTOLIC) 20 MG TABS Take 20 mg by mouth daily.    . rosuvastatin (CRESTOR) 5 MG tablet Take 5 mg by mouth daily.    [name] spironolactone (ALDACTONE) 25 MG tablet Take 0.5 tablets (12.5 mg total) by mouth daily. 45 tablet 3  . tadalafil (CIALIS) 20 MG tablet Take 1 tablet (20 mg total) by mouth daily as needed.  15 tablet 5  . potassium chloride SA (K-DUR,KLOR-CON) 20 MEQ tablet Take 1 tablet (20 mEq total) by mouth daily. (Patient not taking: Reported on 06/14/2017) 90 tablet 3   No current facility-administered medications for this encounter.     Allergies  Allergen Reactions  . Penicillins Other (See Comments)    Childhood reaction    Social History   Socioeconomic History  . Marital status: Married    Spouse name: Not on file  . Number of children: Not on file  . Years of education: Not on file  . Highest education level: Not on file  Occupational History  . Not on file  Social Needs  . Financial resource strain: Not on file  . Food insecurity:    Worry: Not on file     Inability: Not on file  . Transportation needs:    Medical: Not on file    Non-medical: Not on file  Tobacco Use  . Smoking status: Never Smoker  . Smokeless tobacco: Never Used  Substance and Sexual Activity  . Alcohol use: Yes    Comment: 2-3 Times a Week Wine with Meals  . Drug use: Not on file  . Sexual activity: Not on file  Lifestyle  . Physical activity:    Days per week: Not on file    Minutes per session: Not on file  . Stress: Not on file  Relationships  . Social connections:    Talks on phone: Not on file    Gets together: Not on file    Attends religious service: Not on file    Active member of club or organization: Not on file    Attends meetings of clubs or organizations: Not on file    Relationship status: Not on file  . Intimate partner violence:    Fear of current or ex partner: Not on file    Emotionally abused: Not on file    Physically abused: Not on file    Forced sexual activity: Not on file  Other Topics Concern  . Not on file  Social History Narrative  . Not on file    Family History  Problem Relation Age of Onset  . Hypertension Mother   . Diabetes Mother   . Cancer Mother   . Atrial fibrillation Father   . Heart disease Father        had pacemaker  . Hypertension Father   . Diabetes Father     ROS- All systems are reviewed and negative except as per the HPI above  Physical Exam: Vitals:   06/14/17 1522  BP: 102/70  Pulse: 91  Weight: 193 lb 6.4 oz (87.7 kg)  Height: 6\' 1"  (1.854 m)   Wt Readings from Last 3 Encounters:  06/14/17 193 lb 6.4 oz (87.7 kg)  05/31/17 198 lb 3.2 oz (89.9 kg)  04/08/16 208 lb 1.9 oz (94.4 kg)    Labs: Lab Results  Component Value Date   NA 137 06/14/2017   K 4.1 06/14/2017   CL 106 06/14/2017   CO2 22 06/14/2017   GLUCOSE 96 06/14/2017   BUN 25 (H) 06/14/2017   CREATININE 1.17 06/14/2017   CALCIUM 9.3 06/14/2017   MG 1.9 05/31/2017   Lab Results  Component Value Date   INR 1.0  04/25/2008   Lab Results  Component Value Date   CHOL 173 04/08/2016   HDL 39 (L) 04/08/2016   LDLCALC 90 04/08/2016   TRIG 220 (H) 04/08/2016  GEN- The patient is well appearing, alert and oriented x 3 today.   Head- normocephalic, atraumatic Eyes-  Sclera clear, conjunctiva pink Ears- hearing intact Oropharynx- clear Neck- supple, no JVP Lymph- no cervical lymphadenopathy Lungs- Clear to ausculation bilaterally, normal work of breathing Heart- irregular rate and rhythm, no murmurs, rubs or gallops, PMI not laterally displaced GI- soft, NT, ND, + BS Extremities- no clubbing, cyanosis, or edema MS- no significant deformity or atrophy Skin- no rash or lesion Psych- euthymic mood, full affect Neuro- strength and sensation are intact  EKG- afib at 91 bpm, qrs int 74 ms, qtc 435 ms Echo-Study Conclusions  - Left ventricle: The cavity size was normal. Wall thickness was   normal. Systolic function was mildly reduced. The estimated   ejection fraction was in the range of 45% to 50%. Mild diffuse   hypokinesis with no identifiable regional variations. - Ventricular septum: Septal motion showed paradox. - Left atrium: The atrium was mildly dilated. - Right atrium: The atrium was mildly dilated.  CXR-FINDINGS: 05/31/17 Minimally prominent markings are present at the left lung base and developing pneumonia would be a definite consideration. Slight volume loss at the left lung base also is noted. No pleural effusion is seen. The right lung is clear. Mediastinal and hilar contours are unremarkable and the heart is within upper limits of normal in size. No bony abnormality is seen.  IMPRESSION: Prominent markings at the left lung base. Possible early pneumonia. Recommend follow-up.     Assessment and Plan: 1. Persistent  afib Became persistent in the setting of URI in February Up til then had not had any issues with afib I feel now that URI has cleared, it is  reasonable to try cardioversion alone, since he had very good success with first cardioversion holding in SR  IF ERAF, then can discuss antiarrythmic's He did miss a couple of doses of anticoagulation, so will schedule TEE guided cardioversion for 4/16, reminded not to miss any further doses  Continue eliquis 5 mg bid for chadsvasc score of at least 4 Continue Bystolic 20 mg daily Bmet/cbc/BNP today, BNP earlier elevated around 1200 has improved to 101.2 with pt avoiding salt and has lost 5 lbs (intentional) He will f/u with plans with Abd U/S for concerns re weight loss with PCP  F/u here one week after cardioversion  Butch Penny C. [place], [facility] [address] [place], [place] [address] [phone]

## 2017-06-21 ENCOUNTER — Ambulatory Visit
Admission: RE | Admit: 2017-06-21 | Discharge: 2017-06-21 | Disposition: A | Discharge status: Home / Self Care | Payer: 59 | Source: Ambulatory Visit | Attending: Family Medicine | Admitting: Family Medicine

## 2017-06-21 DIAGNOSIS — R634 Abnormal weight loss: Secondary | ICD-10-CM

## 2017-06-28 ENCOUNTER — Encounter (HOSPITAL_COMMUNITY)
Admission: RE | Disposition: A | Discharge status: Home / Self Care | Payer: Self-pay | Source: Ambulatory Visit | Attending: Cardiology

## 2017-06-28 ENCOUNTER — Ambulatory Visit (HOSPITAL_COMMUNITY): Payer: 59 | Admitting: Anesthesiology

## 2017-06-28 ENCOUNTER — Ambulatory Visit (HOSPITAL_BASED_OUTPATIENT_CLINIC_OR_DEPARTMENT_OTHER)
Admission: RE | Admit: 2017-06-28 | Discharge: 2017-06-28 | Disposition: A | Discharge status: Home / Self Care | Payer: 59 | Source: Ambulatory Visit | Attending: Cardiology | Admitting: Cardiology

## 2017-06-28 ENCOUNTER — Ambulatory Visit (HOSPITAL_COMMUNITY)
Admission: RE | Admit: 2017-06-28 | Discharge: 2017-06-28 | Disposition: A | Discharge status: Home / Self Care | Payer: 59 | Source: Ambulatory Visit | Attending: Cardiology | Admitting: Cardiology

## 2017-06-28 ENCOUNTER — Encounter (HOSPITAL_COMMUNITY): Payer: Self-pay

## 2017-06-28 DIAGNOSIS — I361 Nonrheumatic tricuspid (valve) insufficiency: Secondary | ICD-10-CM

## 2017-06-28 DIAGNOSIS — I481 Persistent atrial fibrillation: Secondary | ICD-10-CM | POA: Diagnosis not present

## 2017-06-28 DIAGNOSIS — E785 Hyperlipidemia, unspecified: Secondary | ICD-10-CM | POA: Diagnosis not present

## 2017-06-28 DIAGNOSIS — Z7951 Long term (current) use of inhaled steroids: Secondary | ICD-10-CM | POA: Insufficient documentation

## 2017-06-28 DIAGNOSIS — Z8249 Family history of ischemic heart disease and other diseases of the circulatory system: Secondary | ICD-10-CM | POA: Insufficient documentation

## 2017-06-28 DIAGNOSIS — I110 Hypertensive heart disease with heart failure: Secondary | ICD-10-CM | POA: Diagnosis not present

## 2017-06-28 DIAGNOSIS — Z880 Allergy status to penicillin: Secondary | ICD-10-CM | POA: Diagnosis not present

## 2017-06-28 DIAGNOSIS — Z8673 Personal history of transient ischemic attack (TIA), and cerebral infarction without residual deficits: Secondary | ICD-10-CM | POA: Insufficient documentation

## 2017-06-28 DIAGNOSIS — I509 Heart failure, unspecified: Secondary | ICD-10-CM | POA: Insufficient documentation

## 2017-06-28 DIAGNOSIS — Z7901 Long term (current) use of anticoagulants: Secondary | ICD-10-CM | POA: Diagnosis not present

## 2017-06-28 DIAGNOSIS — I4891 Unspecified atrial fibrillation: Secondary | ICD-10-CM

## 2017-06-28 DIAGNOSIS — I4819 Other persistent atrial fibrillation: Secondary | ICD-10-CM

## 2017-06-28 HISTORY — PX: CARDIOVERSION: SHX1299

## 2017-06-28 HISTORY — PX: TEE WITHOUT CARDIOVERSION: SHX5443

## 2017-06-28 SURGERY — ECHOCARDIOGRAM, TRANSESOPHAGEAL
Anesthesia: General

## 2017-06-28 MED ORDER — PHENYLEPHRINE HCL 10 MG/ML IJ SOLN
INTRAMUSCULAR | Status: DC | PRN
  Administered 2017-06-28: 80 ug via INTRAVENOUS

## 2017-06-28 MED ORDER — SODIUM CHLORIDE 0.9 % IV SOLN: 250.0000 mL | INTRAVENOUS | Status: DC

## 2017-06-28 MED ORDER — SODIUM CHLORIDE 0.9 % IV SOLN: INTRAVENOUS | Status: DC

## 2017-06-28 MED ORDER — LACTATED RINGERS IV SOLN
INTRAVENOUS | Status: DC | PRN
  Administered 2017-06-28: 12:00:00 via INTRAVENOUS

## 2017-06-28 MED ORDER — BUTAMBEN-TETRACAINE-BENZOCAINE 2-2-14 % EX AERO
INHALATION_SPRAY | CUTANEOUS | Status: DC | PRN
  Administered 2017-06-28: 2 via TOPICAL

## 2017-06-28 MED ORDER — SODIUM CHLORIDE 0.9% FLUSH: 3.0000 mL | INTRAVENOUS | Status: DC | PRN

## 2017-06-28 MED ORDER — SODIUM CHLORIDE 0.9% FLUSH: 3.0000 mL | Freq: Two times a day (BID) | INTRAVENOUS | Status: DC

## 2017-06-28 MED ORDER — PROPOFOL 500 MG/50ML IV EMUL
INTRAVENOUS | Status: DC | PRN
  Administered 2017-06-28: 200 ug/kg/min via INTRAVENOUS

## 2017-06-28 NOTE — CV Procedure (Addendum)
    Electrical Cardioversion Procedure Note, TEE  Terry [name] [id number] 10/11/1950  Procedure: Electrical Cardioversion Indications:  Atrial Fibrillation  Time Out: Verified patient identification, verified procedure,medications/allergies/relevent history reviewed, required imaging and test results available.  Performed  Procedure Details  The patient was NPO after midnight. Anesthesia was administered at the beside  by Dr.Miller with propofol.  Cardioversion was performed with synchronized biphasic defibrillation via AP pads with 120, 150 joules.  2 attempt(s) were performed.  The patient converted to normal sinus rhythm. The patient tolerated the procedure well   TEE performed prior to DCCV  TEE IMPRESSION:  Successful cardioversion of atrial fibrillation.   - Left ventricle: Systolic function was mildly to moderately   reduced. The estimated ejection fraction was in the range of 40%   to 45%. No evidence of thrombus. - Aortic valve: No evidence of vegetation. - Mitral valve: Mild prolapse, involving the posterior leaflet. No   evidence of vegetation. - Left atrium: No evidence of thrombus in the atrial cavity or   appendage. No evidence of thrombus in the atrial cavity or   appendage. - Right atrium: No evidence of thrombus in the atrial cavity or   appendage. - Atrial septum: No defect or patent foramen ovale was identified. - Tricuspid valve: No evidence of vegetation. There was moderate   regurgitation. - Pulmonic valve: No evidence of vegetation.  Impressions:  - Successful cardioversion. No cardiac source of emboli was   indentified.    Candee Furbish 06/28/2017, 12:27 PM

## 2017-06-28 NOTE — Anesthesia Preprocedure Evaluation (Addendum)
Anesthesia Evaluation  Patient identified by MRN, date of birth, ID band Patient awake    Reviewed: Allergy & Precautions, NPO status , Patient's Chart, lab work & pertinent test results  Airway Mallampati: II  TM Distance: >3 FB Neck ROM: Full    Dental no notable dental hx.    Pulmonary neg pulmonary ROS,     + wheezing      Cardiovascular hypertension, Pt. on home beta blockers +CHF  Normal cardiovascular exam Rhythm:Regular Rate:Normal  The EF during the TEE and the more recent EF appear to be about the same.  Mildly reduced with EF ~45% ------   Neuro/Psych negative neurological ROS  negative psych ROS   GI/Hepatic negative GI ROS, Neg liver ROS,   Endo/Other  negative endocrine ROS  Renal/GU negative Renal ROS     Musculoskeletal   Abdominal   Peds  Hematology   Anesthesia Other Findings   Reproductive/Obstetrics                           Anesthesia Physical Anesthesia Plan  ASA: III  Anesthesia Plan: General   Post-op Pain Management:    Induction: Intravenous  PONV Risk Score and Plan: Treatment may vary due to age or medical condition  Airway Management Planned: Mask, Natural Airway and Nasal Cannula  Additional Equipment:   Intra-op Plan:   Post-operative Plan:   Informed Consent: I have reviewed the patients History and Physical, chart, labs and discussed the procedure including the risks, benefits and alternatives for the proposed anesthesia with the patient or authorized representative who has indicated his/her understanding and acceptance.   Dental advisory given  Plan Discussed with: CRNA  Anesthesia Plan Comments:        Anesthesia Quick Evaluation

## 2017-06-28 NOTE — Transfer of Care (Signed)
Immediate Anesthesia Transfer of Care Note  Patient: Terry [name]  Procedure(s) Performed: TRANSESOPHAGEAL ECHOCARDIOGRAM (TEE) (N/A ) CARDIOVERSION (N/A )  Patient Location: Endoscopy Unit  Anesthesia Type:MAC  Level of Consciousness: drowsy  Airway & Oxygen Therapy: Patient Spontanous Breathing and Patient connected to nasal cannula oxygen  Post-op Assessment: Report given to RN and Post -op Vital signs reviewed and stable  Post vital signs: Reviewed and stable  Last Vitals:  Vitals Value Taken Time  BP 82/60 06/28/2017 12:28 PM  Temp 36.4 C 06/28/2017 12:28 PM  Pulse 49 06/28/2017 12:36 PM  Resp 20 06/28/2017 12:36 PM  SpO2 89 % 06/28/2017 12:36 PM  Vitals shown include unvalidated device data.  Last Pain:  Vitals:   06/28/17 1228  TempSrc: Oral  PainSc: 0-No pain         Complications: No apparent anesthesia complications

## 2017-06-28 NOTE — Interval H&P Note (Signed)
History and Physical Interval Note:  06/28/2017 11:35 AM  Terry [name]  has presented today for surgery, with the diagnosis of afib  The various methods of treatment have been discussed with the patient and family. After consideration of risks, benefits and other options for treatment, the patient has consented to  Procedure(s): TRANSESOPHAGEAL ECHOCARDIOGRAM (TEE) (N/A) CARDIOVERSION (N/A) as a surgical intervention .  The patient's history has been reviewed, patient examined, no change in status, stable for surgery.  I have reviewed the patient's chart and labs.  Questions were answered to the patient's satisfaction.     [employer]

## 2017-06-28 NOTE — Anesthesia Procedure Notes (Signed)
Procedure Name: MAC Date/Time: 06/28/2017 11:57 AM Performed by: Lieutenant Diego, CRNA Pre-anesthesia Checklist: Patient identified, Emergency Drugs available, Suction available, Patient being monitored and Timeout performed Patient Re-evaluated:Patient Re-evaluated prior to induction Oxygen Delivery Method: Circle system utilized Preoxygenation: Pre-oxygenation with 100% oxygen Induction Type: IV induction

## 2017-06-28 NOTE — Anesthesia Postprocedure Evaluation (Signed)
Anesthesia Post Note  Patient: Terry [name]  Procedure(s) Performed: TRANSESOPHAGEAL ECHOCARDIOGRAM (TEE) (N/A ) CARDIOVERSION (N/A )     Patient location during evaluation: PACU Anesthesia Type: General Level of consciousness: awake and alert Pain management: pain level controlled Vital Signs Assessment: post-procedure vital signs reviewed and stable Respiratory status: spontaneous breathing, nonlabored ventilation, respiratory function stable and patient connected to nasal cannula oxygen Cardiovascular status: blood pressure returned to baseline and stable Postop Assessment: no apparent nausea or vomiting Anesthetic complications: no    Last Vitals:  Vitals:   06/28/17 1250 06/28/17 1300  BP: 95/74 105/84  Pulse: (!) 55 (!) 55  Resp: 17 16  Temp:    SpO2: 96% 100%    Last Pain:  Vitals:   06/28/17 1300  TempSrc:   PainSc: 0-No pain                 Barnet Glasgow

## 2017-06-28 NOTE — Discharge Instructions (Signed)
TEE ° °YOU HAD AN CARDIAC PROCEDURE TODAY: Refer to the procedure report and other information in the discharge instructions given to you for any specific questions about what was found during the examination. If this information does not answer your questions, please call [facility] office at [phone] to clarify.  ° °DIET: Your first meal following the procedure should be a light meal and then it is ok to progress to your normal diet. A half-sandwich or bowl of soup is an example of a good first meal. Heavy or fried foods are harder to digest and may make you feel nauseous or bloated. Drink plenty of fluids but you should avoid alcoholic beverages for 24 hours. If you had a esophageal dilation, please see attached instructions for diet.  ° °ACTIVITY: Your care partner should take you home directly after the procedure. You should plan to take it easy, moving slowly for the rest of the day. You can resume normal activity the day after the procedure however YOU SHOULD NOT DRIVE, use power tools, machinery or perform tasks that involve climbing or major physical exertion for 24 hours (because of the sedation medicines used during the test).  ° °SYMPTOMS TO REPORT IMMEDIATELY: °A cardiologist can be reached at any hour. Please call [phone] for any of the following symptoms:  °Vomiting of blood or coffee ground material  °New, significant abdominal pain  °New, significant chest pain or pain under the shoulder blades  °Painful or persistently difficult swallowing  °New shortness of breath  °Black, tarry-looking or red, bloody stools ° °FOLLOW UP:  °Please also call with any specific questions about appointments or follow up tests. ° °Electrical Cardioversion, Care After °This sheet gives you information about how to care for yourself after your procedure. Your health care provider may also give you more specific instructions. If you have problems or questions, contact your health care provider. °What can I  expect after the procedure? °After the procedure, it is common to have: °· Some redness on the skin where the shocks were given. ° °Follow these instructions at home: °· Do not drive for 24 hours if you were given a medicine to help you relax (sedative). °· Take over-the-counter and prescription medicines only as told by your health care provider. °· Ask your health care provider how to check your pulse. Check it often. °· Rest for 48 hours after the procedure or as told by your health care provider. °· Avoid or limit your caffeine use as told by your health care provider. °Contact a health care provider if: °· You feel like your heart is beating too quickly or your pulse is not regular. °· You have a serious muscle cramp that does not go away. °Get help right away if: °· You have discomfort in your chest. °· You are dizzy or you feel faint. °· You have trouble breathing or you are short of breath. °· Your speech is slurred. °· You have trouble moving an arm or leg on one side of your body. °· Your fingers or toes turn cold or blue. °This information is not intended to replace advice given to you by your health care provider. Make sure you discuss any questions you have with your health care provider. °Document Released: 12/20/2012 Document Revised: 10/03/2015 Document Reviewed: 09/05/2015 °Elsevier Interactive Patient Education © 2018 [employer] Inc[employer] ° °

## 2017-06-29 ENCOUNTER — Encounter (HOSPITAL_COMMUNITY): Payer: Self-pay | Admitting: Cardiology

## 2017-07-05 ENCOUNTER — Encounter (HOSPITAL_COMMUNITY): Payer: Self-pay | Admitting: Nurse Practitioner

## 2017-07-05 ENCOUNTER — Ambulatory Visit (HOSPITAL_COMMUNITY)
Admission: RE | Admit: 2017-07-05 | Discharge: 2017-07-05 | Disposition: A | Discharge status: Home / Self Care | Payer: 59 | Source: Ambulatory Visit | Attending: Nurse Practitioner | Admitting: Nurse Practitioner

## 2017-07-05 VITALS — BP 104/72 | HR 60 | Ht 73.0 in | Wt 197.4 lb

## 2017-07-05 DIAGNOSIS — Z888 Allergy status to other drugs, medicaments and biological substances status: Secondary | ICD-10-CM | POA: Diagnosis not present

## 2017-07-05 DIAGNOSIS — Z7951 Long term (current) use of inhaled steroids: Secondary | ICD-10-CM | POA: Diagnosis not present

## 2017-07-05 DIAGNOSIS — I481 Persistent atrial fibrillation: Secondary | ICD-10-CM | POA: Insufficient documentation

## 2017-07-05 DIAGNOSIS — Z880 Allergy status to penicillin: Secondary | ICD-10-CM | POA: Insufficient documentation

## 2017-07-05 DIAGNOSIS — Z8249 Family history of ischemic heart disease and other diseases of the circulatory system: Secondary | ICD-10-CM | POA: Diagnosis not present

## 2017-07-05 DIAGNOSIS — Z8673 Personal history of transient ischemic attack (TIA), and cerebral infarction without residual deficits: Secondary | ICD-10-CM | POA: Insufficient documentation

## 2017-07-05 DIAGNOSIS — I4819 Other persistent atrial fibrillation: Secondary | ICD-10-CM

## 2017-07-05 DIAGNOSIS — E785 Hyperlipidemia, unspecified: Secondary | ICD-10-CM | POA: Insufficient documentation

## 2017-07-05 DIAGNOSIS — Z7901 Long term (current) use of anticoagulants: Secondary | ICD-10-CM | POA: Diagnosis not present

## 2017-07-05 DIAGNOSIS — Z833 Family history of diabetes mellitus: Secondary | ICD-10-CM | POA: Diagnosis not present

## 2017-07-05 DIAGNOSIS — I4891 Unspecified atrial fibrillation: Secondary | ICD-10-CM | POA: Diagnosis present

## 2017-07-05 DIAGNOSIS — Z79899 Other long term (current) drug therapy: Secondary | ICD-10-CM | POA: Insufficient documentation

## 2017-07-05 DIAGNOSIS — I10 Essential (primary) hypertension: Secondary | ICD-10-CM | POA: Insufficient documentation

## 2017-07-05 NOTE — Progress Notes (Signed)
Primary Care Physician: Lawerance Cruel, MD Referring Physician: Sharrell Ku, PA   Terry [name] is a 67 y.o. male with a h/o atrial fibrillation, first occurrence in 2017 with return to [place] with cardioversion, with return to afib in February with URI. He saw Sharrell Ku last week. BNP at that time was 1200, echo showed EF at 45 % . Pt cut out all salt,lost 5 lbs since that visit. He reports that he is minimally symptomatic with afib, he is rate controlled, chronically on Bystolic.[name] Avid exerciser, usually working out 5x a week. He had stopped his exercise routine since the URI and then return of afib. There was concern with Dana's visit that pt has lost 30 lbs over the last year. He states that's he has always been able to drop weight easily, there is an Abd U/S scheduled by PCP to f/u weight loss. He is accompanied by his daughter and wife today. He feels that he has recovered now from URI. He also took decongestants during that time.  F/u in afib clinic s/p successful TEE guided  cardioversion. He continues to remain in [place]. He feels well. He was recently taken off hctz as his BP is running lower.  Currently is 104/72 and is not symptomatic with this. He is anxious to return to his normal gym activities.  Today, he denies symptoms of palpitations, chest pain, shortness of breath, orthopnea, PND, lower extremity edema, dizziness, presyncope, syncope, or neurologic sequela. The patient is tolerating medications without difficulties and is otherwise without complaint today.   Past Medical History:  Diagnosis Date  . Central retinal vein occlusion   . ED (erectile dysfunction)   . H/O cardiac catheterization 2002 - normal cors  . HTN (hypertension)   . Hyperlipidemia   . Hypokalemia   . Leg edema   . LV dysfunction    a. EF appeared mildly depressed by TEE 08/2015, no % given.  . Mini stroke [facility])    a. ? Age 36 at time of central retinal vein occlusion - lost vision in eye  . PAF (paroxysmal  atrial fibrillation) ([place])    a. Dx 06/2015, s/p TEE/DCCV 08/2015.  [name] Pericarditis 2010  . Syncope    Hx of thought to be due to orthostatic hypotension   Past Surgical History:  Procedure Laterality Date  . CARDIAC CATHETERIZATION  2002  . CARDIOVERSION N/A 09/08/2015   Procedure: CARDIOVERSION;  Surgeon: Fay Records, MD;  Location: [place];  Service: Cardiovascular;  Laterality: N/A;  . CARDIOVERSION N/A 06/28/2017   Procedure: CARDIOVERSION;  Surgeon: Jerline Pain, MD;  Location: [facility] ENDOSCOPY;  Service: Cardiovascular;  Laterality: N/A;  . HAND SURGERY  2003  . KNEE SURGERY    . NASAL SINUS SURGERY  2003   x2. Scar tissue build up and had to recreate his tear duct  . PERICARDIECTOMY    . TEE WITHOUT CARDIOVERSION N/A 09/08/2015   Procedure: TRANSESOPHAGEAL ECHOCARDIOGRAM (TEE);  Surgeon: Fay Records, MD;  Location[facility] ENDOSCOPY;  Service: Cardiovascular;  Laterality: N/A;  . TEE WITHOUT CARDIOVERSION N/A 06/28/2017   Procedure: TRANSESOPHAGEAL ECHOCARDIOGRAM (TEE);  Surgeon: Jerline Pain, MD;  Location[facility] ENDOSCOPY;  Service: Cardiovascular;  Laterality: N/A;    Current Outpatient Medications  Medication Sig Dispense Refill  . albuterol (PROVENTIL HFA;VENTOLIN HFA) 108 (90 Base) MCG/ACT inhaler Inhale 2 puffs into the lungs every 4 (four) hours as needed for wheezing or shortness of breath.    . cetirizine (ZYRTEC ALLERGY) 10  MG tablet Take 10 mg by mouth daily.    [name] ELIQUIS 5 MG TABS tablet TAKE 1 TABLET BY MOUTH TWICE A DAY 60 tablet 1  . fluticasone (FLONASE) 50 MCG/ACT nasal spray Place 2 sprays into both nostrils daily.    . Nebivolol HCl (BYSTOLIC) 20 MG TABS Take 20 mg by mouth daily.    . Probiotic Product (PROBIOTIC DAILY PO) Take 1 capsule by mouth daily.    . rosuvastatin (CRESTOR) 5 MG tablet Take 5 mg by mouth daily.    . tadalafil (CIALIS) 20 MG tablet Take 1 tablet (20 mg total) by mouth daily as needed. 15 tablet 5   No current facility-administered  medications for this encounter.     Allergies  Allergen Reactions  . Losartan Other (See Comments)    Makes BP drop very low  . Penicillins Other (See Comments)    Childhood reaction Has patient had a PCN reaction causing immediate rash, facial/tongue/throat swelling, SOB or lightheadedness with hypotension: yes Has patient had a PCN reaction causing severe rash involving mucus membranes or skin necrosis: no Has patient had a PCN reaction that required hospitalization: no Has patient had a PCN reaction occurring within the last 10 years: no If all of the above answers are "NO", then may proceed with Cephalosporin use.     Social History   Socioeconomic History  . Marital status: Married    Spouse name: Not on file  . Number of children: Not on file  . Years of education: Not on file  . Highest education level: Not on file  Occupational History  . Not on file  Social Needs  . Financial resource strain: Not on file  . Food insecurity:    Worry: Not on file    Inability: Not on file  . Transportation needs:    Medical: Not on file    Non-medical: Not on file  Tobacco Use  . Smoking status: Never Smoker  . Smokeless tobacco: Never Used  Substance and Sexual Activity  . Alcohol use: Yes    Comment: 2-3 Times a Week Wine with Meals  . Drug use: Not on file  . Sexual activity: Not on file  Lifestyle  . Physical activity:    Days per week: Not on file    Minutes per session: Not on file  . Stress: Not on file  Relationships  . Social connections:    Talks on phone: Not on file    Gets together: Not on file    Attends religious service: Not on file    Active member of club or organization: Not on file    Attends meetings of clubs or organizations: Not on file    Relationship status: Not on file  . Intimate partner violence:    Fear of current or ex partner: Not on file    Emotionally abused: Not on file    Physically abused: Not on file    Forced sexual activity:  Not on file  Other Topics Concern  . Not on file  Social History Narrative  . Not on file    Family History  Problem Relation Age of Onset  . Hypertension Mother   . Diabetes Mother   . Cancer Mother   . Atrial fibrillation Father   . Heart disease Father        had pacemaker  . Hypertension Father   . Diabetes Father     ROS- All systems are reviewed and negative except as  per the HPI above  Physical Exam: Vitals:   07/05/17 1506  Weight: 197 lb 6.4 oz (89.5 kg)  Height: 6\' 1"  (1.854 m)   Wt Readings from Last 3 Encounters:  07/05/17 197 lb 6.4 oz (89.5 kg)  06/28/17 193 lb 6.4 oz (87.7 kg)  06/14/17 193 lb 6.4 oz (87.7 kg)    Labs: Lab Results  Component Value Date   NA 137 06/14/2017   K 4.1 06/14/2017   CL 106 06/14/2017   CO2 22 06/14/2017   GLUCOSE 96 06/14/2017   BUN 25 (H) 06/14/2017   CREATININE 1.17 06/14/2017   CALCIUM 9.3 06/14/2017   MG 1.9 05/31/2017   Lab Results  Component Value Date   INR 1.0 04/25/2008   Lab Results  Component Value Date   CHOL 173 04/08/2016   HDL 39 (L) 04/08/2016   LDLCALC 90 04/08/2016   TRIG 220 (H) 04/08/2016     GEN- The patient is well appearing, alert and oriented x 3 today.   Head- normocephalic, atraumatic Eyes-  Sclera clear, conjunctiva pink Ears- hearing intact Oropharynx- clear Neck- supple, no JVP Lymph- no cervical lymphadenopathy Lungs- Clear to ausculation bilaterally, normal work of breathing Heart- irregular rate and rhythm, no murmurs, rubs or gallops, PMI not laterally displaced GI- soft, NT, ND, + BS Extremities- no clubbing, cyanosis, or edema MS- no significant deformity or atrophy Skin- no rash or lesion Psych- euthymic mood, full affect Neuro- strength and sensation are intact  EKG- afib at 91 bpm, qrs int 74 ms, qtc 435 ms Echo-Study Conclusions  - Left ventricle: The cavity size was normal. Wall thickness was   normal. Systolic function was mildly reduced. The estimated    ejection fraction was in the range of 45% to 50%. Mild diffuse   hypokinesis with no identifiable regional variations. - Ventricular septum: Septal motion showed paradox. - Left atrium: The atrium was mildly dilated. - Right atrium: The atrium was mildly dilated.  CXR-FINDINGS: 05/31/17 Minimally prominent markings are present at the left lung base and developing pneumonia would be a definite consideration. Slight volume loss at the left lung base also is noted. No pleural effusion is seen. The right lung is clear. Mediastinal and hilar contours are unremarkable and the heart is within upper limits of normal in size. No bony abnormality is seen.  IMPRESSION: Prominent markings at the left lung base. Possible early pneumonia. Recommend follow-up.     Assessment and Plan: 1. Persistent  afib Became persistent in the setting of URI in February Up until then afib had been very quiet  S/p successful cardioversion Continue eliquis 5 mg bid for chadsvasc score of at least 4 Continue Bystolic 20 mg daily Can return to usual activites  F/u with Dr. Acie Fredrickson in June as per recall afib clinic as needed   Butch Penny C. Carroll, [facility] [address] [place], [place] [address] [phone]

## 2017-07-17 ENCOUNTER — Other Ambulatory Visit: Payer: Self-pay | Admitting: Cardiovascular Disease

## 2017-07-17 DIAGNOSIS — E785 Hyperlipidemia, unspecified: Secondary | ICD-10-CM

## 2017-07-17 DIAGNOSIS — I480 Paroxysmal atrial fibrillation: Secondary | ICD-10-CM

## 2017-07-17 DIAGNOSIS — I10 Essential (primary) hypertension: Secondary | ICD-10-CM

## 2017-07-18 NOTE — Telephone Encounter (Signed)
Eliquis 5mg  refill request received; pt is 67 yrs old, Crea-1.17 on 06/14/17, Wt-89.9kg, last seen by Roderic Palau on 07/05/17; refill sent to requested pharmacy.

## 2017-07-29 ENCOUNTER — Ambulatory Visit: Payer: Managed Care, Other (non HMO) | Admitting: Cardiovascular Disease

## 2017-09-20 ENCOUNTER — Encounter: Payer: Self-pay | Admitting: Physician Assistant

## 2017-09-20 ENCOUNTER — Ambulatory Visit: Payer: 59 | Admitting: Physician Assistant

## 2017-09-20 VITALS — BP 118/88 | HR 55 | Ht 73.0 in | Wt 201.1 lb

## 2017-09-20 DIAGNOSIS — I4819 Other persistent atrial fibrillation: Secondary | ICD-10-CM

## 2017-09-20 DIAGNOSIS — I10 Essential (primary) hypertension: Secondary | ICD-10-CM | POA: Diagnosis not present

## 2017-09-20 DIAGNOSIS — R0789 Other chest pain: Secondary | ICD-10-CM

## 2017-09-20 DIAGNOSIS — I481 Persistent atrial fibrillation: Secondary | ICD-10-CM | POA: Diagnosis not present

## 2017-09-20 DIAGNOSIS — E785 Hyperlipidemia, unspecified: Secondary | ICD-10-CM

## 2017-09-20 DIAGNOSIS — I429 Cardiomyopathy, unspecified: Secondary | ICD-10-CM | POA: Diagnosis not present

## 2017-09-20 NOTE — Patient Instructions (Addendum)
Patients with heart disease should avoid stimulant-type medicines. This includes medicines like Adderall or even cold/allergy medicines that contain pseudoephedrine or phenylephrine. (Sometimes on the bottle it will say "-D" to indicate the decongestant, which you'll need to avoid.) Please make sure to pay attention to labels.  We will decide your clearance for your colonoscopy once we get your heart CT results back.  Please check your blood pressure a few times a week. When we call with your cardiac CT results, please give [place] an idea of your average reading so we can make a decision about any medication adjustments.  Medication Instructions:  Your physician recommends that you continue on your current medications as directed. Please refer to the Current Medication list given to you today.   Labwork: Your physician recommends that you have lab work today: bmet   Testing/Procedures: See Instructions Below  Follow-Up: Your physician recommends that you keep your scheduled follow-up appointment with Dr. Acie Fredrickson.   Any Other Special Instructions Will Be Listed Below (If Applicable).  Please arrive at the [facility] main entrance of [facility] to be determined AM (30-45 minutes prior to test start time)  [facility] [place], [place] [address] [phone]  Proceed to the [facility] Radiology Department (First Floor).  Please follow these instructions carefully (unless otherwise directed):  Hold all erectile dysfunction medications at least 48 hours prior to test.  On the Night Before the Test: . Drink plenty of water. . Do not consume any caffeinated/decaffeinated beverages or chocolate 12 hours prior to your test. . Do not take any antihistamines 12 hours prior to your test.   On the Day of the Test: . Drink plenty of water. Do not drink any water within one hour of the test. . Do not eat any food 4 hours prior to the test. . You may take  your regular medications prior to the test. Make sure you take Bystolic.  After the Test: . Drink plenty of water. . After receiving IV contrast, you may experience a mild flushed feeling. This is normal. . On occasion, you may experience a mild rash up to 24 hours after the test. This is not dangerous. If this occurs, you can take Benadryl 25 mg and increase your fluid intake. . If you experience trouble breathing, this can be serious. If it is severe call 911 IMMEDIATELY. If it is mild, please call our office.     If you need a refill on your cardiac medications before your next appointment, please call your pharmacy.

## 2017-09-20 NOTE — Progress Notes (Signed)
Cardiology Office Note    Date:  09/20/2017  ID:  Terry [name], DOB [birth year], MRN [id number] PCP:  Lawerance Cruel, MD  Cardiologist:  Mertie Moores, MD   Chief Complaint: f/u atrial fib  History of Present Illness:  Terry [name] is a 67 y.o. male with history of persistent atrial fibrillation (dx 06/2015 s/p TEE/DCCV 08/2015), cardiomyopathy (EF 45-50%), HTN, HLD, h/o syncope felt r/t orthostasis, pericarditis 2010, normal cors 2002, central retinal vein occlusion with possible mini-stroke at that time (age 68) s/p laser therapy, probable CKD stage II who is here to f/u atrial fib.  Atrial fib was diagnosed by PCP 06/2015 at which time the patient had noticed sluggishness but had been going to the gym regularly without acute symptoms. 2D Echo 08/19/15 showed false tendon in LV apex of no clinical significance, EF 50-55%, no RWMA, mild LAE, PASP 65mmHg. He was started on Eliquis and underwent TEE/DCCV 08/2015 which showed large LA with spontaneous contrast swirling, mildly depressed LVEF (number not given). Sleep study 10/2015 was normal. He developed a URI around 04/2017 and felt very poorly since that time. I saw him in 05/2017 when he was experiencing fatigue, weight loss in the setting of poor appetite, and orthopnea. He was back in atrial fibrillation and blood pressure was low. Losartan, HCTZ, and spironolactone ended up having to be stopped. 2D echo 06/03/17 showed EF 45-50% with diffuse HK, mild LAE/RAE - Dr. Acie Fredrickson reviewed and felt similar to prior. CXR 05/2017 showed prominent markings of left lung base, possible early PNA -> was advised to keep ongoing f/u with PCP.  TEE 06/28/17 showed EF 40-45, mild MV prolapse and he underwent successful DCCV. At last OV 07/05/17 with Terry [name], he was maintaining NSR. Abs [place] 06/21/17 showed fatty liver, otherwise no acute changes. CHADSVASC 4 for HTN, TIA, cardiomyopathy. Last labs 06/2017 normal CBC, K 4.1, Cr 1.17, normal LFTs, BNP improved; 05/2017 TSH  wnl, Mg 1.9. His weight loss has leveled off and he's gained some back per EMR.  He returns for follow-up feeling well.He noticed an immediate difference in fatigue and breathing after DCCV. He has noticed it has taken him longer this time to build up stamina at the gym although he did quit exercising for 2 months before all this happened. He endorses rare occasional chest discomfort a few times a week, lasting about 3-4 minutes at a time. It can happen in various settings without specific precipitant such as walking into work or laying down to sleep at night. He's not specifically noticed it with exertion. He's due for a routine colonoscopy when cleared by [place].   Past Medical History:  Diagnosis Date  . Central retinal vein occlusion   . ED (erectile dysfunction)   . H/O cardiac catheterization 2002 - normal cors  . HTN (hypertension)   . Hyperlipidemia   . Hypokalemia   . Leg edema   . LV dysfunction    a. EF appeared mildly depressed by TEE 08/2015, no % given.  . Mini stroke [facility])    a. ? Age 14 at time of central retinal vein occlusion - lost vision in eye  . PAF (paroxysmal atrial fibrillation) ([place])    a. Dx 06/2015, s/p TEE/DCCV 08/2015.  [name] Pericarditis 2010  . Syncope    Hx of thought to be due to orthostatic hypotension    Past Surgical History:  Procedure Laterality Date  . CARDIAC CATHETERIZATION  2002  . CARDIOVERSION N/A 09/08/2015   Procedure: CARDIOVERSION;  Surgeon: Fay Records, MD;  Location: [facility] ENDOSCOPY;  Service: Cardiovascular;  Laterality: N/A;  . CARDIOVERSION N/A 06/28/2017   Procedure: CARDIOVERSION;  Surgeon: Jerline Pain, MD;  Location: [facility] ENDOSCOPY;  Service: Cardiovascular;  Laterality: N/A;  . HAND SURGERY  2003  . KNEE SURGERY    . NASAL SINUS SURGERY  2003   x2. Scar tissue build up and had to recreate his tear duct  . PERICARDIECTOMY    . TEE WITHOUT CARDIOVERSION N/A 09/08/2015   Procedure: TRANSESOPHAGEAL ECHOCARDIOGRAM (TEE);  Surgeon: Fay Records, MD;  Location: [facility] ENDOSCOPY;  Service: Cardiovascular;  Laterality: N/A;  . TEE WITHOUT CARDIOVERSION N/A 06/28/2017   Procedure: TRANSESOPHAGEAL ECHOCARDIOGRAM (TEE);  Surgeon: Jerline Pain, MD;  Location[facility] ENDOSCOPY;  Service: Cardiovascular;  Laterality: N/A;    Current Medications: Current Meds  Medication Sig  . albuterol (PROVENTIL HFA;VENTOLIN HFA) 108 (90 Base) MCG/ACT inhaler Inhale 2 puffs into the lungs every 4 (four) hours as needed for wheezing or shortness of breath.  . cetirizine (ZYRTEC ALLERGY) 10 MG tablet Take 10 mg by mouth daily.  [name] ELIQUIS 5 MG TABS tablet TAKE 1 TABLET BY MOUTH TWICE A DAY  . fluticasone (FLONASE) 50 MCG/ACT nasal spray Place 2 sprays into both nostrils daily.  . Nebivolol HCl (BYSTOLIC) 20 MG TABS Take 20 mg by mouth daily.  . Probiotic Product (PROBIOTIC DAILY PO) Take 1 capsule by mouth daily.  . rosuvastatin (CRESTOR) 5 MG tablet Take 5 mg by mouth daily.    Allergies:   Losartan and Penicillins   Social History   Socioeconomic History  . Marital status: Married    Spouse name: Not on file  . Number of children: Not on file  . Years of education: Not on file  . Highest education level: Not on file  Occupational History  . Not on file  Social Needs  . Financial resource strain: Not on file  . Food insecurity:    Worry: Not on file    Inability: Not on file  . Transportation needs:    Medical: Not on file    Non-medical: Not on file  Tobacco Use  . Smoking status: Never Smoker  . Smokeless tobacco: Never Used  Substance and Sexual Activity  . Alcohol use: Yes    Comment: 2-3 Times a Week Wine with Meals  . Drug use: Not on file  . Sexual activity: Not on file  Lifestyle  . Physical activity:    Days per week: Not on file    Minutes per session: Not on file  . Stress: Not on file  Relationships  . Social connections:    Talks on phone: Not on file    Gets together: Not on file    Attends religious service: Not on  file    Active member of club or organization: Not on file    Attends meetings of clubs or organizations: Not on file    Relationship status: Not on file  Other Topics Concern  . Not on file  Social History Narrative  . Not on file     Family History:  The patient's family history includes Atrial fibrillation in his father; Cancer in his mother; Diabetes in his father and mother; Heart disease in his father; Hypertension in his father and mother.  ROS:   Please see the history of present illness. All other systems are reviewed and otherwise negative.    PHYSICAL EXAM:   VS:  BP 118/88  Pulse (!) 55   Ht 6\' 1"  (1.854 m)   Wt 201 lb 1.9 oz (91.2 kg)   SpO2 95%   BMI 26.53 kg/m   BMI: Body mass index is 26.53 kg/m. GEN: Well nourished, well developed WM, in no acute distress HEENT: normocephalic, atraumatic Neck: no JVD, carotid bruits, or masses Cardiac: RRR; no murmurs, rubs, or gallops, no edema  Respiratory:  clear to auscultation bilaterally, normal work of breathing GI: soft, nontender, nondistended, + BS MS: no deformity or atrophy Skin: warm and dry, no rash Neuro:  Alert and Oriented x 3, Strength and sensation are intact, follows commands Psych: euthymic mood, full affect  Wt Readings from Last 3 Encounters:  09/20/17 201 lb 1.9 oz (91.2 kg)  07/05/17 197 lb 6.4 oz (89.5 kg)  06/28/17 193 lb 6.4 oz (87.7 kg)      Studies/Labs Reviewed:   EKG:  EKG was ordered today and personally reviewed by me and demonstrates sinus bradycardia 55bpm otherwise normal without acute changes.  Recent Labs: 05/31/2017: Magnesium 1.9; NT-Pro BNP 1,280; TSH 3.390 06/14/2017: ALT 30; B Natriuretic Peptide 101.2; BUN 25; Creatinine, Ser 1.17; Hemoglobin 16.4; Platelets 300; Potassium 4.1; Sodium 137   Lipid Panel    Component Value Date/Time   CHOL 173 04/08/2016 1640   TRIG 220 (H) 04/08/2016 1640   HDL 39 (L) 04/08/2016 1640   CHOLHDL 4.4 04/08/2016 1640   CHOLHDL 4  02/22/2011 0914   VLDL 17.4 02/22/2011 0914   LDLCALC 90 04/08/2016 1640    Additional studies/ records that were reviewed today include: Summarized above.  ASSESSMENT & PLAN:   1. Persistent atrial fibrillation - maintaining NSR post DCCV. Continue Eliquis. It seems like his episodes are precipitated by acute infection so we discussed preventative hygiene measures. Discussed avoidance of decongestants. Continue beta blocker. With regarding to colonoscopy clearance, since this is elective, will wait for his cardiac CT results first. If this looks OK, I would feel confident clearing him to proceed as we are >4 weeks out from DCCV. I reviewed with pharmacy in clinic today who would recommend holding Eliquis only 24 hours prior to colonoscopy (given hx of his prior embolic event) when it comes time to formally clear. 2. Cardiomyopathy/chest discomfort - the etiology of his LV dysfunction has not been completely elucidated but I suspect related to atrial fibrillation. He does reveal that he's had occasional chest discomfort but this does not sound stereotypical for angina. It would be prudent to exclude ischemic cardiomyopathy. He is not a great candidate for ETT or ETT-nuc given the need to hold beta blocker (do not want to precipitate going back in afib). Will plan cardiac CT to evaluate. At some point it might be worthwhile to see what his LVEF is after maintaining NSR. This can be discussed in follow-up. His blood pressure does appear to have shown some recovery now back in NSR. I've asked him to keep a log of BPs and when we call with cardiac CT results to give [place] the average so we can make some decisions about whether perhaps to add back ARB given his cardiomyopathy. He appears euvolemic today. 3. HTN - controlled, as above. 4. Hyperlipidemia - continue statin for now. If coronary disease is detected, will need to revisit lipid goals.  Disposition: F/u with Dr .Acie Fredrickson in 4 months.    Medication  Adjustments/Labs and Tests Ordered: Current medicines are reviewed at length with the patient today.  Concerns regarding medicines are outlined above. Medication changes,  Labs and Tests ordered today are summarized above and listed in the Patient Instructions accessible in Encounters.   Signed, Charlie Pitter, PA-C  09/20/2017 4:04 PM    [facility] [place], [place], [place]  [address] Phone: [phone]; Fax: [phone]

## 2017-09-21 LAB — BASIC METABOLIC PANEL
BUN/Creatinine Ratio: 16 (ref 10–24)
BUN: 18 mg/dL (ref 8–27)
CHLORIDE: 103 mmol/L (ref 96–106)
CO2: 24 mmol/L (ref 20–29)
Calcium: 9.1 mg/dL (ref 8.6–10.2)
Creatinine, Ser: 1.1 mg/dL (ref 0.76–1.27)
GFR calc non Af Amer: 71 mL/min/{1.73_m2} (ref >59)
GFR, EST AFRICAN AMERICAN: 82 mL/min/{1.73_m2} (ref >59)
GLUCOSE: 91 mg/dL (ref 65–99)
Potassium: 4.3 mmol/L (ref 3.5–5.2)
Sodium: 142 mmol/L (ref 134–144)

## 2017-11-09 ENCOUNTER — Ambulatory Visit (HOSPITAL_COMMUNITY): Payer: 59

## 2017-11-09 ENCOUNTER — Ambulatory Visit (HOSPITAL_COMMUNITY)
Admission: RE | Admit: 2017-11-09 | Discharge: 2017-11-09 | Disposition: A | Discharge status: Home / Self Care | Payer: 59 | Source: Ambulatory Visit | Attending: Physician Assistant | Admitting: Physician Assistant

## 2017-11-09 DIAGNOSIS — E785 Hyperlipidemia, unspecified: Secondary | ICD-10-CM | POA: Diagnosis not present

## 2017-11-09 DIAGNOSIS — R079 Chest pain, unspecified: Secondary | ICD-10-CM | POA: Diagnosis not present

## 2017-11-09 DIAGNOSIS — D3501 Benign neoplasm of right adrenal gland: Secondary | ICD-10-CM | POA: Insufficient documentation

## 2017-11-09 DIAGNOSIS — I10 Essential (primary) hypertension: Secondary | ICD-10-CM

## 2017-11-09 DIAGNOSIS — I481 Persistent atrial fibrillation: Secondary | ICD-10-CM | POA: Diagnosis present

## 2017-11-09 DIAGNOSIS — R0789 Other chest pain: Secondary | ICD-10-CM | POA: Diagnosis not present

## 2017-11-09 DIAGNOSIS — I4819 Other persistent atrial fibrillation: Secondary | ICD-10-CM

## 2017-11-09 DIAGNOSIS — I429 Cardiomyopathy, unspecified: Secondary | ICD-10-CM | POA: Diagnosis present

## 2017-11-09 MED ORDER — NITROGLYCERIN 0.4 MG SL SUBL
0.8000 mg | SUBLINGUAL_TABLET | Freq: Once | SUBLINGUAL | Status: AC
  Administered 2017-11-09: 0.8 mg via SUBLINGUAL

## 2017-11-09 MED ORDER — METOPROLOL TARTRATE 5 MG/5ML IV SOLN
INTRAVENOUS | Status: AC
  Filled 2017-11-09: qty 5

## 2017-11-09 MED ORDER — IOPAMIDOL (ISOVUE-370) INJECTION 76%
100.0000 mL | Freq: Once | INTRAVENOUS | Status: AC | PRN
  Administered 2017-11-09: 100 mL via INTRAVENOUS

## 2017-11-09 MED ORDER — METOPROLOL TARTRATE 5 MG/5ML IV SOLN
5.0000 mg | Freq: Once | INTRAVENOUS | Status: DC
Start: 2017-11-09 — End: 2017-11-10

## 2017-11-09 MED ORDER — NITROGLYCERIN 0.4 MG SL SUBL
SUBLINGUAL_TABLET | SUBLINGUAL | Status: AC
  Filled 2017-11-09: qty 2

## 2017-11-09 NOTE — Progress Notes (Signed)
Ct complete. Patient denies any complaints. Patient given drink and cookies.

## 2017-11-10 ENCOUNTER — Encounter: Payer: Self-pay | Admitting: Physician Assistant

## 2017-11-21 ENCOUNTER — Other Ambulatory Visit: Payer: Self-pay | Admitting: Dermatology

## 2017-11-21 LAB — POCT I-STAT CREATININE: CREATININE: 1 mg/dL (ref 0.61–1.24)

## 2018-01-02 ENCOUNTER — Ambulatory Visit (HOSPITAL_COMMUNITY)
Admission: RE | Admit: 2018-01-02 | Discharge: 2018-01-02 | Disposition: A | Discharge status: Home / Self Care | Payer: 59 | Source: Ambulatory Visit | Attending: Nurse Practitioner | Admitting: Nurse Practitioner

## 2018-01-02 ENCOUNTER — Encounter (HOSPITAL_COMMUNITY): Payer: Self-pay | Admitting: Nurse Practitioner

## 2018-01-02 VITALS — BP 108/72 | HR 80 | Ht 73.0 in | Wt 205.0 lb

## 2018-01-02 DIAGNOSIS — I10 Essential (primary) hypertension: Secondary | ICD-10-CM | POA: Diagnosis not present

## 2018-01-02 DIAGNOSIS — Z8673 Personal history of transient ischemic attack (TIA), and cerebral infarction without residual deficits: Secondary | ICD-10-CM | POA: Diagnosis not present

## 2018-01-02 DIAGNOSIS — Z7901 Long term (current) use of anticoagulants: Secondary | ICD-10-CM | POA: Diagnosis not present

## 2018-01-02 DIAGNOSIS — Z79899 Other long term (current) drug therapy: Secondary | ICD-10-CM | POA: Diagnosis not present

## 2018-01-02 DIAGNOSIS — Z880 Allergy status to penicillin: Secondary | ICD-10-CM | POA: Diagnosis not present

## 2018-01-02 DIAGNOSIS — E785 Hyperlipidemia, unspecified: Secondary | ICD-10-CM | POA: Insufficient documentation

## 2018-01-02 DIAGNOSIS — I4819 Other persistent atrial fibrillation: Secondary | ICD-10-CM

## 2018-01-02 NOTE — Progress Notes (Signed)
Primary Care Physician: Lawerance Cruel, MD Referring Physician: Sharrell Ku, PA   Terry [name] is a 67 y.o. male with a h/o atrial fibrillation, first occurrence in 2017, with return to [place] with cardioversion, with return to afib in February with URI. He saw Sharrell Ku last week. BNP at that time was 1200, echo showed EF at 45 % . Pt cut out all salt,lost 5 lbs since that visit. He reports that he is minimally symptomatic with afib, he is rate controlled, chronically on Bystolic.[name] Avid exerciser, usually working out 5x a week. He had stopped his exercise routine since the URI and then return of afib.  He also took decongestants during that time.  F/u in afib clinic, 4/23, s/p successful TEE guided  cardioversion. He continued to remain in SR.    Pt is being seen today for return of  afib for the last several weeks. He is asymptomatic but picked up afib on his apple watch. He is in the afib clinic to discuss options. He is rate controlled.  Today, he denies symptoms of palpitations, chest pain, shortness of breath, orthopnea, PND, lower extremity edema, dizziness, presyncope, syncope, or neurologic sequela. The patient is tolerating medications without difficulties and is otherwise without complaint today.   Past Medical History:  Diagnosis Date  . Aortic root dilation (HCC)    a. mild by CT 10/2017.  [name] Central retinal vein occlusion   . ED (erectile dysfunction)   . H/O cardiac catheterization 2002 - normal cors  . HTN (hypertension)   . Hyperlipidemia   . Hypokalemia   . Leg edema   . LV dysfunction    a. EF appeared mildly depressed by TEE 08/2015, no % given.  . Mini stroke [facility])    a. ? Age 89 at time of central retinal vein occlusion - lost vision in eye  . PAF (paroxysmal atrial fibrillation) ([place])    a. Dx 06/2015, s/p TEE/DCCV 08/2015.  [name] Pericarditis 2010  . Syncope    Hx of thought to be due to orthostatic hypotension   Past Surgical History:  Procedure Laterality Date    . CARDIAC CATHETERIZATION  2002  . CARDIOVERSION N/A 09/08/2015   Procedure: CARDIOVERSION;  Surgeon: Fay Records, MD;  Location: [place];  Service: Cardiovascular;  Laterality: N/A;  . CARDIOVERSION N/A 06/28/2017   Procedure: CARDIOVERSION;  Surgeon: Jerline Pain, MD;  Location: [facility] ENDOSCOPY;  Service: Cardiovascular;  Laterality: N/A;  . HAND SURGERY  2003  . KNEE SURGERY    . NASAL SINUS SURGERY  2003   x2. Scar tissue build up and had to recreate his tear duct  . PERICARDIECTOMY    . TEE WITHOUT CARDIOVERSION N/A 09/08/2015   Procedure: TRANSESOPHAGEAL ECHOCARDIOGRAM (TEE);  Surgeon: Fay Records, MD;  Location: [facility] ENDOSCOPY;  Service: Cardiovascular;  Laterality: N/A;  . TEE WITHOUT CARDIOVERSION N/A 06/28/2017   Procedure: TRANSESOPHAGEAL ECHOCARDIOGRAM (TEE);  Surgeon: Jerline Pain, MD;  Location: [facility] ENDOSCOPY;  Service: Cardiovascular;  Laterality: N/A;    Current Outpatient Medications  Medication Sig Dispense Refill  . albuterol (PROVENTIL HFA;VENTOLIN HFA) 108 (90 Base) MCG/ACT inhaler Inhale 2 puffs into the lungs every 4 (four) hours as needed for wheezing or shortness of breath.    . cetirizine (ZYRTEC ALLERGY) 10 MG tablet Take 10 mg by mouth daily.    [name] ELIQUIS 5 MG TABS tablet TAKE 1 TABLET BY MOUTH TWICE A DAY 60 tablet 11  . fluticasone (FLONASE) 50 MCG/ACT  nasal spray Place 2 sprays into both nostrils daily.    . Nebivolol HCl (BYSTOLIC) 20 MG TABS Take 20 mg by mouth daily.    . Probiotic Product (PROBIOTIC DAILY PO) Take 1 capsule by mouth daily.    . rosuvastatin (CRESTOR) 5 MG tablet Take 5 mg by mouth daily.    . tadalafil (CIALIS) 20 MG tablet Take 1 tablet (20 mg total) by mouth daily as needed. 15 tablet 5   No current facility-administered medications for this encounter.     Allergies  Allergen Reactions  . Losartan Other (See Comments)    Makes BP drop very low  . Penicillins Other (See Comments)    Childhood reaction Has patient had a PCN  reaction causing immediate rash, facial/tongue/throat swelling, SOB or lightheadedness with hypotension: yes Has patient had a PCN reaction causing severe rash involving mucus membranes or skin necrosis: no Has patient had a PCN reaction that required hospitalization: no Has patient had a PCN reaction occurring within the last 10 years: no If all of the above answers are "NO", then may proceed with Cephalosporin use.     Social History   Socioeconomic History  . Marital status: Married    Spouse name: Not on file  . Number of children: Not on file  . Years of education: Not on file  . Highest education level: Not on file  Occupational History  . Not on file  Social Needs  . Financial resource strain: Not on file  . Food insecurity:    Worry: Not on file    Inability: Not on file  . Transportation needs:    Medical: Not on file    Non-medical: Not on file  Tobacco Use  . Smoking status: Never Smoker  . Smokeless tobacco: Never Used  Substance and Sexual Activity  . Alcohol use: Yes    Comment: 2-3 Times a Week Wine with Meals  . Drug use: Not on file  . Sexual activity: Not on file  Lifestyle  . Physical activity:    Days per week: Not on file    Minutes per session: Not on file  . Stress: Not on file  Relationships  . Social connections:    Talks on phone: Not on file    Gets together: Not on file    Attends religious service: Not on file    Active member of club or organization: Not on file    Attends meetings of clubs or organizations: Not on file    Relationship status: Not on file  . Intimate partner violence:    Fear of current or ex partner: Not on file    Emotionally abused: Not on file    Physically abused: Not on file    Forced sexual activity: Not on file  Other Topics Concern  . Not on file  Social History Narrative  . Not on file    Family History  Problem Relation Age of Onset  . Hypertension Mother   . Diabetes Mother   . Cancer Mother   .  Atrial fibrillation Father   . Heart disease Father        had pacemaker  . Hypertension Father   . Diabetes Father     ROS- All systems are reviewed and negative except as per the HPI above  Physical Exam: Vitals:   01/02/18 1405  BP: 108/72  Pulse: 80  Weight: 93 kg  Height: 6\' 1"  (1.854 m)   Wt Readings from Last  3 Encounters:  01/02/18 93 kg  09/20/17 91.2 kg  07/05/17 89.5 kg    Labs: Lab Results  Component Value Date   NA 142 09/20/2017   K 4.3 09/20/2017   CL 103 09/20/2017   CO2 24 09/20/2017   GLUCOSE 91 09/20/2017   BUN 18 09/20/2017   CREATININE 1.00 11/09/2017   CALCIUM 9.1 09/20/2017   MG 1.9 05/31/2017   Lab Results  Component Value Date   INR 1.0 04/25/2008   Lab Results  Component Value Date   CHOL 173 04/08/2016   HDL 39 (L) 04/08/2016   LDLCALC 90 04/08/2016   TRIG 220 (H) 04/08/2016     GEN- The patient is well appearing, alert and oriented x 3 today.   Head- normocephalic, atraumatic Eyes-  Sclera clear, conjunctiva pink Ears- hearing intact Oropharynx- clear Neck- supple, no JVP Lymph- no cervical lymphadenopathy Lungs- Clear to ausculation bilaterally, normal work of breathing Heart- irregular rate and rhythm, no murmurs, rubs or gallops, PMI not laterally displaced GI- soft, NT, ND, + BS Extremities- no clubbing, cyanosis, or edema MS- no significant deformity or atrophy Skin- no rash or lesion Psych- euthymic mood, full affect Neuro- strength and sensation are intact  EKG- afib at 80 bpm, qrs int 74 ms, qtc 435 ms Echo-Study Conclusions  - Left ventricle: The cavity size was normal. Wall thickness was   normal. Systolic function was mildly reduced. The estimated   ejection fraction was in the range of 45% to 50%. Mild diffuse   hypokinesis with no identifiable regional variations. - Ventricular septum: Septal motion showed paradox. - Left atrium: The atrium was mildly dilated. - Right atrium: The atrium was mildly  dilated.   Assessment and Plan: 1. Persistent  afib/asymptoamtic Became persistent in the setting of URI in February, had successful cardioversion but has recently returned to afib Continue eliquis 5 mg bid for chadsvasc score of at least 4 Continue Bystolic 20 mg daily We discussed options, cardioversion alone or AAD therapy... Since he has had a reduced EF in the past, best to avoid Multaq and 1C agents He is on the young side for amiodarone Tiksoyn and sotalol discussed  Pt will let me decide how he wants to proceed F/u Dr. Cathie Olden 11/21  Geroge Baseman. Jayvyn Haselton, [facility] [address] [place], [place] [address] [phone]

## 2018-01-03 ENCOUNTER — Encounter: Payer: Self-pay | Admitting: Cardiovascular Disease

## 2018-01-05 ENCOUNTER — Ambulatory Visit: Payer: 59 | Admitting: Cardiovascular Disease

## 2018-01-05 ENCOUNTER — Encounter: Payer: Self-pay | Admitting: Cardiovascular Disease

## 2018-01-05 VITALS — BP 98/72 | HR 80 | Ht 73.0 in | Wt 201.0 lb

## 2018-01-05 DIAGNOSIS — I10 Essential (primary) hypertension: Secondary | ICD-10-CM

## 2018-01-05 DIAGNOSIS — I4819 Other persistent atrial fibrillation: Secondary | ICD-10-CM

## 2018-01-05 NOTE — Patient Instructions (Signed)
Medication Instructions:  Your physician recommends that you continue on your current medications as directed. Please refer to the Current Medication list given to you today.  If you need a refill on your cardiac medications before your next appointment, please call your pharmacy.   Lab work: None Ordered  If you have labs (blood work) drawn today and your tests are completely normal, you will receive your results only by: [name] MyChart Message (if you have MyChart) OR . A paper copy in the mail If you have any lab test that is abnormal or we need to change your treatment, we will call you to review the results.   Testing/Procedures: None Ordered   Follow-Up: At [facility], you and your health needs are our priority.  As part of our continuing mission to provide you with exceptional heart care, we have created designated Provider Care Teams.  These Care Teams include your primary Cardiologist (physician) and Advanced Practice Providers (APPs -  Physician Assistants and Nurse Practitioners) who all work together to provide you with the care you need, when you need it. You will need a follow up appointment in:  3 months.  Please call our office 2 months in advance to schedule this appointment.  You may see Mertie Moores, MD or one of the following Advanced Practice Providers on your designated Care Team: Melina Copa, PA-C [name], PA-C [place], [place] . Daune Perch, NP

## 2018-01-05 NOTE — Progress Notes (Signed)
Terry [name] Date of Birth  Jun 06, 1950 [facility] [address]    [place], [place]  [address] [phone]   Problem List 1. Hx of chest pain: Normal cath in 2002 [name])  A single plane ventriculogram revealed normal wall motion, ejection fraction  of approximately 65%. There was no gradient noted on pullback.  Fluoroscopy revealed no significant calcification.  Coronary angiography:  1. The left main coronary artery bifurcated into the left anterior descending  and circumflex vessel. There was no significant disease in the left main  coronary artery.  2. Left anterior descending: The left anterior descending artery gave rise to  a moderate sized D-1, a small D-2, and went on to end as an apical  recurrent branch. There was no significant disease in the left anterior  descending or its branches.  3. Circumflex vessel: The circumflex vessel was a large caliber vessel that  gave rise to a large branching obtuse marginal, then went on to end as an  AV groove vessel.  4. Right coronary artery: The right coronary artery was dominant for the  posterior circulation. The right coronary artery gave rise to two small  ______ marginals, a moderate sized PDA, and two large PL branches. There  was no significant disease in the right coronary artery or its branches.  2. Paroxysmal atrial fib 3. Essential HTN   Terry [name] is a 67 year old gentleman with a history of hypertension and hyperlipidemia. He's done very well since I last saw him last year. He still cycling on a regular basis. He denies any episodes of syncope or presyncope.  Oct. 14, 2014:  Terry [name] was seen in the ER several weeks ago for chest pain.   On Sept. 24 - he awoke with CP.  He reports having some leg cramping earlier that night.  He has been lifting weights and does not think it was MSK.   The pain was mid sternal, radiated outward,  Pulsating like CP.  The pain lasted for several hours and  he went to the ED.  The pain resolved with Toradol.   He had some residual CP for then next several days.  He took motrin - the discomfort was gone after several days.    He has esopageal pain with  Eating dry rice, dry chicken.   Oct. 25, 2017:  Terry [name] is doing well BP is elevated.  Saw [name] several months ago.  Irbesartan was doubled.  His sleep study was negative.  Is not taking the Kdur - thinks it may be causig him gas  Had a TEE / Cardioversion in September 08, 2015:    BP is elevated .   Does not eat any salty foods.  Has slacked off on his cycling.   [facility]   Has a strong family hx of  HTN   Jan. 25, 2018:  Feeling well.   BP is much better on the Aldactone  Still at the [facility] regularly  Stopped taking the Kdur due to stomach ache.   January 05, 2018: Terry [name] is seen back today for follow-up visit.  He is  He has been seen on numerous occasions by Sharrell Ku and more recently by Roderic Palau for persistent atrial fibrillation.  He feels much better in NSR compared to Afib  Has had 2 cardioversions The last cardioversion did not last very long  He saw Roderic Palau - she suggested Tikosyn   Current  Outpatient Medications on File Prior to Visit  Medication Sig Dispense Refill  . albuterol (PROVENTIL HFA;VENTOLIN HFA) 108 (90 Base) MCG/ACT inhaler Inhale 2 puffs into the lungs every 4 (four) hours as needed for wheezing or shortness of breath.    . cetirizine (ZYRTEC ALLERGY) 10 MG tablet Take 10 mg by mouth daily.    [name] ELIQUIS 5 MG TABS tablet TAKE 1 TABLET BY MOUTH TWICE A DAY 60 tablet 11  . fluticasone (FLONASE) 50 MCG/ACT nasal spray Place 2 sprays into both nostrils daily.    . Nebivolol HCl (BYSTOLIC) 20 MG TABS Take 20 mg by mouth daily.    . Probiotic Product (PROBIOTIC DAILY PO) Take 1 capsule by mouth daily.    . rosuvastatin (CRESTOR) 5 MG tablet Take 5 mg by mouth daily.    . tadalafil (CIALIS) 20 MG tablet Take 1 tablet (20  mg total) by mouth daily as needed. 15 tablet 5   No current facility-administered medications on file prior to visit.     Allergies  Allergen Reactions  . Losartan Other (See Comments)    Makes BP drop very low  . Penicillins Other (See Comments)    Childhood reaction Has patient had a PCN reaction causing immediate rash, facial/tongue/throat swelling, SOB or lightheadedness with hypotension: yes Has patient had a PCN reaction causing severe rash involving mucus membranes or skin necrosis: no Has patient had a PCN reaction that required hospitalization: no Has patient had a PCN reaction occurring within the last 10 years: no If all of the above answers are "NO", then may proceed with Cephalosporin use.     Past Medical History:  Diagnosis Date  . Aortic root dilation (HCC)    a. mild by CT 10/2017.  [name] Central retinal vein occlusion   . ED (erectile dysfunction)   . H/O cardiac catheterization 2002 - normal cors  . HTN (hypertension)   . Hyperlipidemia   . Hypokalemia   . Leg edema   . LV dysfunction    a. EF appeared mildly depressed by TEE 08/2015, no % given.  . Mini stroke [facility])    a. 67 at time of central retinal vein occlusion - lost vision in eye  . PAF (paroxysmal atrial fibrillation) ([place])    a. Dx 06/2015, s/p TEE/DCCV 08/2015.  [name] Pericarditis 2010  . Syncope    Hx of thought to be due to orthostatic hypotension    Past Surgical History:  Procedure Laterality Date  . CARDIAC CATHETERIZATION  2002  . CARDIOVERSION N/A 09/08/2015   Procedure: CARDIOVERSION;  Surgeon: Fay Records, MD;  Location: [facility];  Service: Cardiovascular;  Laterality: N/A;  . CARDIOVERSION N/A 06/28/2017   Procedure: CARDIOVERSION;  Surgeon: Jerline Pain, MD;  Location: [facility] ENDOSCOPY;  Service: Cardiovascular;  Laterality: N/A;  . HAND SURGERY  2003  . KNEE SURGERY    . NASAL SINUS SURGERY  2003   x2. Scar tissue build up and had to recreate his tear duct  . PERICARDIECTOMY      . TEE WITHOUT CARDIOVERSION N/A 09/08/2015   Procedure: TRANSESOPHAGEAL ECHOCARDIOGRAM (TEE);  Surgeon: Fay Records, MD;  Location: [facility] ENDOSCOPY;  Service: Cardiovascular;  Laterality: N/A;  . TEE WITHOUT CARDIOVERSION N/A 06/28/2017   Procedure: TRANSESOPHAGEAL ECHOCARDIOGRAM (TEE);  Surgeon: Jerline Pain, MD;  Location: [facility] ENDOSCOPY;  Service: Cardiovascular;  Laterality: N/A;    Social History   Tobacco Use  Smoking Status Never Smoker  Smokeless Tobacco Never Used  Social History   Substance and Sexual Activity  Alcohol Use Yes   Comment: 2-3 Times a Week Wine with Meals    Family History  Problem Relation Age of Onset  . Hypertension Mother   . Diabetes Mother   . Cancer Mother   . Atrial fibrillation Father   . Heart disease Father        had pacemaker  . Hypertension Father   . Diabetes Father     Reviw of Systems:  Reviewed in the HPI.  All other systems are negative.  Physical Exam: Blood pressure 98/72, pulse 80, height 6\' 1"  (1.854 m), weight 201 lb (91.2 kg), SpO2 98 %.  GEN:  Well nourished, well developed in no acute distress HEENT: Normal NECK: No JVD; No carotid bruits LYMPHATICS: No lymphadenopathy CARDIAC: RRR  RESPIRATORY:  Clear to auscultation without rales, wheezing or rhonchi  ABDOMEN: Soft, non-tender, non-distended MUSCULOSKELETAL:  No edema; No deformity  SKIN: Warm and dry NEUROLOGIC:  Alert and oriented x 3    ECG:   Assessment / Plan:   1. Paroxysmal atrial fibrillation:   Long discussion with the patient and his wife.  I support Kerby Less decision to try dofetilide. I think he  feels better in normal sinus rhythm. Continue  Eliquis.  He will follow-up with Roderic Palau.  He has had FMLA forms filled out but his employer did not accept him.  We will fill them out again.  2. Essential hypertension:  Blood pressure is actually on the low side.  Continue Bystolic.  We may need to decrease the dose after his  cardioversion.  3.  Chronic systolic congestive heart failure: His ejection fraction is 45 to 50%.  This may improve if we keep him in normal sinus rhythm.  He is had a heart catheterization years ago which revealed normal coronary arteries.  Melina Copa, PA will  see him in 6 months.    Mertie Moores, MD  01/05/2018 2:29 PM    [address] [place],  [place], [place]  [address] Pager [phone] Phone: [phone]; Fax: [phone]

## 2018-01-06 ENCOUNTER — Telehealth: Payer: Self-pay | Admitting: Pharmacist

## 2018-01-06 NOTE — Telephone Encounter (Signed)
Medication list reviewed in anticipation of upcoming Tikosyn initiation. Patient is not taking any contraindicated or QTc prolonging medications based on current list. Potassium ok on most recent panel.   Patient is anticoagulated on Eliquis on the appropriate dose. Please ensure that patient has not missed any anticoagulation doses in the 3 weeks prior to Tikosyn initiation.   Patient will need to be counseled to avoid use of Benadryl while on Tikosyn and in the 2-3 days prior to Tikosyn initiation.

## 2018-01-17 ENCOUNTER — Other Ambulatory Visit: Payer: Self-pay

## 2018-01-17 ENCOUNTER — Encounter (HOSPITAL_COMMUNITY): Payer: Self-pay | Admitting: Nurse Practitioner

## 2018-01-17 ENCOUNTER — Ambulatory Visit (HOSPITAL_COMMUNITY)
Admission: RE | Admit: 2018-01-17 | Discharge: 2018-01-17 | Disposition: A | Discharge status: Home / Self Care | Payer: 59 | Source: Ambulatory Visit | Attending: Nurse Practitioner | Admitting: Nurse Practitioner

## 2018-01-17 ENCOUNTER — Inpatient Hospital Stay (HOSPITAL_COMMUNITY)
Admission: AD | Admit: 2018-01-17 | Discharge: 2018-01-20 | DRG: 309 | Disposition: A | Discharge status: Home / Self Care | Payer: 59 | Attending: Internal Medicine | Admitting: Internal Medicine

## 2018-01-17 ENCOUNTER — Encounter (HOSPITAL_COMMUNITY): Payer: Self-pay | Admitting: General Practice

## 2018-01-17 VITALS — BP 118/76 | HR 85 | Ht 73.0 in | Wt 204.0 lb

## 2018-01-17 DIAGNOSIS — I4819 Other persistent atrial fibrillation: Secondary | ICD-10-CM | POA: Diagnosis not present

## 2018-01-17 DIAGNOSIS — I5022 Chronic systolic (congestive) heart failure: Secondary | ICD-10-CM | POA: Diagnosis present

## 2018-01-17 DIAGNOSIS — Z8249 Family history of ischemic heart disease and other diseases of the circulatory system: Secondary | ICD-10-CM

## 2018-01-17 DIAGNOSIS — I10 Essential (primary) hypertension: Secondary | ICD-10-CM | POA: Diagnosis not present

## 2018-01-17 DIAGNOSIS — I428 Other cardiomyopathies: Secondary | ICD-10-CM | POA: Diagnosis present

## 2018-01-17 DIAGNOSIS — Z79899 Other long term (current) drug therapy: Secondary | ICD-10-CM

## 2018-01-17 DIAGNOSIS — I110 Hypertensive heart disease with heart failure: Secondary | ICD-10-CM | POA: Diagnosis present

## 2018-01-17 DIAGNOSIS — Z23 Encounter for immunization: Secondary | ICD-10-CM | POA: Diagnosis not present

## 2018-01-17 DIAGNOSIS — Z7901 Long term (current) use of anticoagulants: Secondary | ICD-10-CM

## 2018-01-17 DIAGNOSIS — E785 Hyperlipidemia, unspecified: Secondary | ICD-10-CM | POA: Diagnosis present

## 2018-01-17 DIAGNOSIS — I519 Heart disease, unspecified: Secondary | ICD-10-CM | POA: Diagnosis not present

## 2018-01-17 DIAGNOSIS — Z7951 Long term (current) use of inhaled steroids: Secondary | ICD-10-CM

## 2018-01-17 HISTORY — DX: Other long term (current) drug therapy: Z79.899

## 2018-01-17 HISTORY — DX: Encounter for therapeutic drug level monitoring: Z51.81

## 2018-01-17 LAB — BASIC METABOLIC PANEL
Anion gap: 7 (ref 5–15)
BUN: 16 mg/dL (ref 8–23)
CO2: 26 mmol/L (ref 22–32)
Calcium: 8.8 mg/dL — ABNORMAL LOW (ref 8.9–10.3)
Chloride: 108 mmol/L (ref 98–111)
Creatinine, Ser: 1.16 mg/dL (ref 0.61–1.24)
Glucose, Bld: 102 mg/dL — ABNORMAL HIGH (ref 70–99)
Potassium: 3.9 mmol/L (ref 3.5–5.1)
SODIUM: 141 mmol/L (ref 135–145)

## 2018-01-17 LAB — MAGNESIUM: MAGNESIUM: 2.1 mg/dL (ref 1.7–2.4)

## 2018-01-17 MED ORDER — INFLUENZA VAC SPLIT HIGH-DOSE 0.5 ML IM SUSY
0.5000 mL | PREFILLED_SYRINGE | INTRAMUSCULAR | Status: AC
  Administered 2018-01-18: 0.5 mL via INTRAMUSCULAR
  Filled 2018-01-17: qty 0.5

## 2018-01-17 MED ORDER — FLUTICASONE PROPIONATE 50 MCG/ACT NA SUSP
2.0000 | Freq: Every day | NASAL | Status: DC
  Administered 2018-01-18 – 2018-01-20 (×3): 2 via NASAL
  Filled 2018-01-17: qty 16

## 2018-01-17 MED ORDER — SODIUM CHLORIDE 0.9% FLUSH: 3.0000 mL | INTRAVENOUS | Status: DC | PRN

## 2018-01-17 MED ORDER — ROSUVASTATIN CALCIUM 10 MG PO TABS
5.0000 mg | ORAL_TABLET | Freq: Every day | ORAL | Status: DC
  Administered 2018-01-18 – 2018-01-20 (×3): 5 mg via ORAL
  Filled 2018-01-17 (×3): qty 1

## 2018-01-17 MED ORDER — ALBUTEROL SULFATE (2.5 MG/3ML) 0.083% IN NEBU: 2.5000 mg | INHALATION_SOLUTION | RESPIRATORY_TRACT | Status: DC | PRN

## 2018-01-17 MED ORDER — DOFETILIDE 500 MCG PO CAPS
500.0000 ug | ORAL_CAPSULE | Freq: Two times a day (BID) | ORAL | Status: DC
  Administered 2018-01-17 – 2018-01-18 (×3): 500 ug via ORAL
  Filled 2018-01-17 (×3): qty 1

## 2018-01-17 MED ORDER — LORATADINE 10 MG PO TABS: 10.0000 mg | ORAL_TABLET | Freq: Every day | ORAL | Status: DC

## 2018-01-17 MED ORDER — RISAQUAD PO CAPS
1.0000 | ORAL_CAPSULE | Freq: Every day | ORAL | Status: DC
  Administered 2018-01-18 – 2018-01-20 (×3): 1 via ORAL
  Filled 2018-01-17 (×3): qty 1

## 2018-01-17 MED ORDER — NEBIVOLOL HCL 10 MG PO TABS
20.0000 mg | ORAL_TABLET | Freq: Every day | ORAL | Status: DC
  Administered 2018-01-18 – 2018-01-20 (×3): 20 mg via ORAL
  Filled 2018-01-17: qty 2
  Filled 2018-01-17: qty 4
  Filled 2018-01-17 (×2): qty 2
  Filled 2018-01-17: qty 4

## 2018-01-17 MED ORDER — SODIUM CHLORIDE 0.9 % IV SOLN: 250.0000 mL | INTRAVENOUS | Status: DC | PRN

## 2018-01-17 MED ORDER — POTASSIUM CHLORIDE CRYS ER 20 MEQ PO TBCR
20.0000 meq | EXTENDED_RELEASE_TABLET | Freq: Once | ORAL | Status: AC
  Administered 2018-01-17: 20 meq via ORAL
  Filled 2018-01-17: qty 1

## 2018-01-17 MED ORDER — APIXABAN 5 MG PO TABS
5.0000 mg | ORAL_TABLET | Freq: Two times a day (BID) | ORAL | Status: DC
  Administered 2018-01-17 – 2018-01-20 (×6): 5 mg via ORAL
  Filled 2018-01-17 (×6): qty 1

## 2018-01-17 MED ORDER — SODIUM CHLORIDE 0.9% FLUSH
3.0000 mL | Freq: Two times a day (BID) | INTRAVENOUS | Status: DC
  Administered 2018-01-17 – 2018-01-19 (×5): 3 mL via INTRAVENOUS

## 2018-01-17 NOTE — Progress Notes (Signed)
Spoke with pharmacy about initial dose of Tikosyn relating to K level of 3.9. She gave the ok to give first dose because of K replacement given by day shift nurse. First dose given at 915.

## 2018-01-17 NOTE — Progress Notes (Signed)
Pharmacy Review for Dofetilide (Tikosyn) Initiation  Admit Complaint: 67 y.o. male admitted 01/17/2018 with atrial fibrillation to be initiated on dofetilide.   Assessment:  Patient Exclusion Criteria: If any screening criteria checked as "Yes", then  patient  should NOT receive dofetilide until criteria item is corrected. If "Yes" please indicate correction plan.  YES  NO Patient  Exclusion Criteria Correction Plan  []  [x]  Baseline QTc interval is greater than or equal to 440 msec. IF above YES box checked dofetilide contraindicated unless patient has ICD; then may proceed if QTc 500-550 msec or with known ventricular conduction abnormalities may proceed with QTc 550-600 msec. QTc =     []  [x]  Magnesium level is less than 1.8 mEq/l : Last magnesium:  Lab Results  Component Value Date   MG 2.1 01/17/2018         [x]  []  Potassium level is less than 4 mEq/l : Last potassium:  Lab Results  Component Value Date   K 3.9 01/17/2018       Got order from NP for 20 meq  []  [x]  Patient is known or suspected to have a digoxin level greater than 2 ng/ml: No results found for: DIGOXIN    []  [x]  Creatinine clearance less than 20 ml/min (calculated using Cockcroft-Gault, actual body weight and serum creatinine): Estimated Creatinine Clearance: 71.7 mL/min (by C-G formula based on SCr of 1.16 mg/dL).    []  [x]  Patient has received drugs known to prolong the QT intervals within the last 48 hours (phenothiazines, tricyclics or tetracyclic antidepressants, erythromycin, H-1 antihistamines, cisapride, fluoroquinolones, azithromycin). Drugs not listed above may have an, as yet, undetected potential to prolong the QT interval, updated information on QT prolonging agents is available at this website:QT prolonging agents   []  [x]  Patient received a dose of hydrochlorothiazide (Oretic) alone or in any combination including triamterene (Dyazide, Maxzide) in the last 48 hours.   []  []  Patient received a  medication known to increase dofetilide plasma concentrations prior to initial dofetilide dose:  . Trimethoprim (Primsol, Proloprim) in the last 36 hours . Verapamil (Calan, Verelan) in the last 36 hours or a sustained release dose in the last 72 hours . Megestrol (Megace) in the last 5 days  . Cimetidine (Tagamet) in the last 6 hours . Ketoconazole (Nizoral) in the last 24 hours . Itraconazole (Sporanox) in the last 48 hours  . Prochlorperazine (Compazine) in the last 36 hours    []  [x]  Patient is known to have a history of torsades de pointes; congenital or acquired long QT syndromes.   []  [x]  Patient has received a Class 1 antiarrhythmic with less than 2 half-lives since last dose. (Disopyramide, Quinidine, Procainamide, Lidocaine, Mexiletine, Flecainide, Propafenone)   []  [x]  Patient has received amiodarone therapy in the past 3 months or amiodarone level is greater than 0.3 ng/ml.    Patient has been appropriately anticoagulated with Eliquis.  Ordering provider was confirmed at [email or web address] if they are not listed on the [facility] Prescribers list.  Goal of Therapy: Follow renal function, electrolytes, potential drug interactions, and dose adjustment. Provide education and 1 week supply at discharge.  Plan:  [x]   Physician selected initial dose within range recommended for patients level of renal function - will monitor for response.  []   Physician selected initial dose outside of range recommended for patients level of renal function - will discuss if the dose should be altered at this time.   Select One Calculated CrCl  Dose q12h  [x]  >  60 ml/min 500 mcg  []  40-60 ml/min 250 mcg  []  20-40 ml/min 125 mcg   2. Follow up QTc after the first 5 doses, renal function, electrolytes (K & Mg) daily x 3     days, dose adjustment, success of initiation and facilitate 1 week discharge supply as     clinically indicated.  3. Initiate Tikosyn education video (Call [phone] and ask  for Tikosyn Video # 116).  4. Place Enrollment Form on the chart for discharge supply of dofetilide.   Corinda Gubler 3:37 PM 01/17/2018

## 2018-01-17 NOTE — Care Management (Signed)
#   4.  S/W [facility] @ [employer] # [phone]  1.TIKOSYN   125 MCG  250 MCG   500 MCG   BID COVER- NOT COVER PRIOR APPROVAL- YES # [id number] FOR EXCEPTION  2. DOFETILIDE  125 MCG   250 MCG  500 MCG  BID COVER- YES CO-PAY-  ZERO DOLLARSFOR EACH PRESCRIPTION TIER- NO PRIOR APPROVAL- NO  ONE TIME FILL FOR 30 DAY SUPPLY THEN MUST USE 90 DAYS SUPPLY - COST ZERO DOLLARS PREFERRED PHARMACY : YES  [facility]  [address],[id number]

## 2018-01-17 NOTE — Progress Notes (Signed)
Primary Care Physician: Lawerance Cruel, MD Referring Physician: Sharrell Ku, PA   Terry [name] is a 67 y.o. male with a h/o atrial fibrillation, first occurrence in 2017, with return to [place] with cardioversion, with return to afib in February with URI. He saw Sharrell Ku at that time. BNP at  was 1200, echo showed EF at 45 % . Pt cut out all salt,lost 5 lbs since that visit. He reports that he is minimally symptomatic with afib, he is rate controlled, chronically on Bystolic.Avid exerciser, usually working out 5x a week. He had stopped his exercise routine since the URI with  return of afib.  He also took decongestants during that time.  F/u in afib clinic, 4/23, s/p successful TEE guided  cardioversion. He continued to remain in SR.    Pt is seen recently with  return of  afib for the last several weeks. He is asymptomatic but picked up afib on his apple watch. He is in the afib clinic to discuss options. He is rate controlled. Pt decided he wanted to go ahead with tikosyn.  F/u 11,5, he is here for admit to hospital for Tikosyn, Can afford drug,  no missed does of anticoagulation, no benadryl use.  Today, he denies symptoms of palpitations, chest pain, shortness of breath, orthopnea, PND, lower extremity edema, dizziness, presyncope, syncope, or neurologic sequela. The patient is tolerating medications without difficulties and is otherwise without complaint today.   Past Medical History:  Diagnosis Date  . Aortic root dilation (HCC)    a. mild by CT 10/2017.  [name] Central retinal vein occlusion   . ED (erectile dysfunction)   . H/O cardiac catheterization 2002 - normal cors  . HTN (hypertension)   . Hyperlipidemia   . Hypokalemia   . Leg edema   . LV dysfunction    a. EF appeared mildly depressed by TEE 08/2015, no % given.  . Mini stroke [facility])    a. ? Age 64 at time of central retinal vein occlusion - lost vision in eye  . PAF (paroxysmal atrial fibrillation) ([place])    a. Dx 06/2015,  s/p TEE/DCCV 08/2015.  [name] Pericarditis 2010  . Syncope    Hx of thought to be due to orthostatic hypotension   Past Surgical History:  Procedure Laterality Date  . CARDIAC CATHETERIZATION  2002  . CARDIOVERSION N/A 09/08/2015   Procedure: CARDIOVERSION;  Surgeon: Fay Records, MD;  Location: [facility];  Service: Cardiovascular;  Laterality: N/A;  . CARDIOVERSION N/A 06/28/2017   Procedure: CARDIOVERSION;  Surgeon: Jerline Pain, MD;  Location: [facility] ENDOSCOPY;  Service: Cardiovascular;  Laterality: N/A;  . HAND SURGERY  2003  . KNEE SURGERY    . NASAL SINUS SURGERY  2003   x2. Scar tissue build up and had to recreate his tear duct  . PERICARDIECTOMY    . TEE WITHOUT CARDIOVERSION N/A 09/08/2015   Procedure: TRANSESOPHAGEAL ECHOCARDIOGRAM (TEE);  Surgeon: Fay Records, MD;  Location: [facility] ENDOSCOPY;  Service: Cardiovascular;  Laterality: N/A;  . TEE WITHOUT CARDIOVERSION N/A 06/28/2017   Procedure: TRANSESOPHAGEAL ECHOCARDIOGRAM (TEE);  Surgeon: Jerline Pain, MD;  Location: [facility] ENDOSCOPY;  Service: Cardiovascular;  Laterality: N/A;    Current Outpatient Medications  Medication Sig Dispense Refill  . albuterol (PROVENTIL HFA;VENTOLIN HFA) 108 (90 Base) MCG/ACT inhaler Inhale 2 puffs into the lungs every 4 (four) hours as needed for wheezing or shortness of breath.    . cetirizine (ZYRTEC ALLERGY) 10 MG tablet Take  10 mg by mouth daily.    [name] ELIQUIS 5 MG TABS tablet TAKE 1 TABLET BY MOUTH TWICE A DAY 60 tablet 11  . fluorouracil (EFUDEX) 5 % cream Apply topically 2 (two) times daily.    . fluticasone (FLONASE) 50 MCG/ACT nasal spray Place 2 sprays into both nostrils daily.    . Nebivolol HCl (BYSTOLIC) 20 MG TABS Take 20 mg by mouth daily.    . Probiotic Product (PROBIOTIC DAILY PO) Take 1 capsule by mouth daily.    . rosuvastatin (CRESTOR) 5 MG tablet Take 5 mg by mouth daily.    . tadalafil (CIALIS) 20 MG tablet Take 1 tablet (20 mg total) by mouth daily as needed. 15 tablet 5   No  current facility-administered medications for this encounter.     Allergies  Allergen Reactions  . Losartan Other (See Comments)    Makes BP drop very low  . Penicillins Other (See Comments)    Childhood reaction Has patient had a PCN reaction causing immediate rash, facial/tongue/throat swelling, SOB or lightheadedness with hypotension: yes Has patient had a PCN reaction causing severe rash involving mucus membranes or skin necrosis: no Has patient had a PCN reaction that required hospitalization: no Has patient had a PCN reaction occurring within the last 10 years: no If all of the above answers are "NO", then may proceed with Cephalosporin use.     Social History   Socioeconomic History  . Marital status: Married    Spouse name: Not on file  . Number of children: Not on file  . Years of education: Not on file  . Highest education level: Not on file  Occupational History  . Not on file  Social Needs  . Financial resource strain: Not on file  . Food insecurity:    Worry: Not on file    Inability: Not on file  . Transportation needs:    Medical: Not on file    Non-medical: Not on file  Tobacco Use  . Smoking status: Never Smoker  . Smokeless tobacco: Never Used  Substance and Sexual Activity  . Alcohol use: Yes    Comment: 2-3 Times a Week Wine with Meals  . Drug use: Never  . Sexual activity: Not on file  Lifestyle  . Physical activity:    Days per week: Not on file    Minutes per session: Not on file  . Stress: Not on file  Relationships  . Social connections:    Talks on phone: Not on file    Gets together: Not on file    Attends religious service: Not on file    Active member of club or organization: Not on file    Attends meetings of clubs or organizations: Not on file    Relationship status: Not on file  . Intimate partner violence:    Fear of current or ex partner: Not on file    Emotionally abused: Not on file    Physically abused: Not on file     Forced sexual activity: Not on file  Other Topics Concern  . Not on file  Social History Narrative  . Not on file    Family History  Problem Relation Age of Onset  . Hypertension Mother   . Diabetes Mother   . Cancer Mother   . Atrial fibrillation Father   . Heart disease Father        had pacemaker  . Hypertension Father   . Diabetes Father  ROS- All systems are reviewed and negative except as per the HPI above  Physical Exam: Vitals:   01/17/18 1119  BP: 118/76  Pulse: 85  Weight: 92.5 kg  Height: 6\' 1"  (1.854 m)   Wt Readings from Last 3 Encounters:  01/17/18 92.5 kg  01/05/18 91.2 kg  01/02/18 93 kg    Labs: Lab Results  Component Value Date   NA 142 09/20/2017   K 4.3 09/20/2017   CL 103 09/20/2017   CO2 24 09/20/2017   GLUCOSE 91 09/20/2017   BUN 18 09/20/2017   CREATININE 1.00 11/09/2017   CALCIUM 9.1 09/20/2017   MG 1.9 05/31/2017   Lab Results  Component Value Date   INR 1.0 04/25/2008   Lab Results  Component Value Date   CHOL 173 04/08/2016   HDL 39 (L) 04/08/2016   LDLCALC 90 04/08/2016   TRIG 220 (H) 04/08/2016     GEN- The patient is well appearing, alert and oriented x 3 today.   Head- normocephalic, atraumatic Eyes-  Sclera clear, conjunctiva pink Ears- hearing intact Oropharynx- clear Neck- supple, no JVP Lymph- no cervical lymphadenopathy Lungs- Clear to ausculation bilaterally, normal work of breathing Heart- irregular rate and rhythm, no murmurs, rubs or gallops, PMI not laterally displaced GI- soft, NT, ND, + BS Extremities- no clubbing, cyanosis, or edema MS- no significant deformity or atrophy Skin- no rash or lesion Psych- euthymic mood, full affect Neuro- strength and sensation are intact  EKG- afib at 85 bpm, qrs int 79 ms, qtc 449 ms Echo-Study Conclusions  - Left ventricle: The cavity size was normal. Wall thickness was   normal. Systolic function was mildly reduced. The estimated   ejection fraction was  in the range of 45% to 50%. Mild diffuse   hypokinesis with no identifiable regional variations. - Ventricular septum: Septal motion showed paradox. - Left atrium: The atrium was mildly dilated. - Right atrium: The atrium was mildly dilated. TEE- 06/2017-Study Conclusions  - Left ventricle: Systolic function was mildly to moderately   reduced. The estimated ejection fraction was in the range of 40%   to 45%. No evidence of thrombus. - Aortic valve: No evidence of vegetation. - Mitral valve: Mild prolapse, involving the posterior leaflet. No   evidence of vegetation. - Left atrium: No evidence of thrombus in the atrial cavity or   appendage. No evidence of thrombus in the atrial cavity or   appendage. - Right atrium: No evidence of thrombus in the atrial cavity or   appendage. - Atrial septum: No defect or patent foramen ovale was identified. - Tricuspid valve: No evidence of vegetation. There was moderate   regurgitation. - Pulmonic valve: No evidence of vegetation.  Impressions:  - Successful cardioversion. No cardiac source of emboli was   indentified.  Assessment and Plan: 1. Persistent  Afib/asymptomatic but with LV dysfunction Became persistent in the setting of URI in February, had successful cardioversion but has recently returned to afib with  EF around 40% Continues eliquis 5 mg bid for chadsvasc score of at least 4, no missed  doses Continue Bystolic 20 mg daily We discussed options, cardioversion alone or AAD therapy... Since he has had a reduced EF,, best to avoid Multaq and 1C agents He is on the young side for amiodarone Tiksoyn and sotalol discussed, he decided tikosyn, able to afford drug General precautions re drug discussed  No benadryl use No qtc prolonging drugs on board Qtc today is acceptable at 449 ms Bmet/mag pending  To 6E when bed is available    Butch Penny C. Serina Nichter, [facility] [address] [place], [place]  [address] [phone]

## 2018-01-17 NOTE — H&P (Signed)
Primary Care Physician: Lawerance Cruel, [name] Referring Physician: Sharrell Ku, PA   Terry [name] is a 67 y.o. male with a h/o atrial fibrillation, first occurrence in 2017, with return to [facility] with cardioversion, with return to afib.  [name] He saw Terry [name] at that time. BNP at  was 1200, echo showed EF at 45 % . Pt cut out all salt,lost 5 lbs since that visit. He reports that he is minimally symptomatic with afib, he is rate controlled, chronically on Bystolic.Avid exerciser, usually working out 5x a week. He had stopped his exercise routine since the URI with  return of afib.  He also took decongestants during that time.  F/u in afib clinic, 4/23, s/p successful TEE guided  cardioversion. He continued to remain in SR.    Pt is seen recently with  return of  afib for the last several weeks. He is asymptomatic but picked up afib on his apple watch. He is in the afib clinic to discuss options. He is rate controlled. Pt decided he wanted to go ahead with tikosyn.  F/u 11,5, he is here for admit to hospital for Tikosyn, Can afford drug,  no missed does of anticoagulation, no benadryl use.  Today, he denies symptoms of palpitations, chest pain, shortness of breath, orthopnea, PND, lower extremity edema, dizziness, presyncope, syncope, or neurologic sequela. The patient is tolerating medications without difficulties and is otherwise without complaint today.       Past Medical History:  Diagnosis Date  . Aortic root dilation (HCC)    a. mild by CT 10/2017.  [name] Central retinal vein occlusion   . ED (erectile dysfunction)   . H/O cardiac catheterization 2002 - normal cors  . HTN (hypertension)   . Hyperlipidemia   . Hypokalemia   . Leg edema   . LV dysfunction    a. EF appeared mildly depressed by TEE 08/2015, no % given.  . Mini stroke [facility])    a. ? Age 75 at time of central retinal vein occlusion - lost vision in eye  . PAF (paroxysmal atrial fibrillation) ([place])    a. Dx  06/2015, s/p TEE/DCCV 08/2015.  [name] Pericarditis 2010  . Syncope    Hx of thought to be due to orthostatic hypotension        Past Surgical History:  Procedure Laterality Date  . CARDIAC CATHETERIZATION  2002  . CARDIOVERSION N/A 09/08/2015   Procedure: CARDIOVERSION;  Surgeon: Terry [name];  Location: [place];  Service: Cardiovascular;  Laterality: N/A;  . CARDIOVERSION N/A 06/28/2017   Procedure: CARDIOVERSION;  Surgeon: Terry [name];  Location: [facility] ENDOSCOPY;  Service: Cardiovascular;  Laterality: N/A;  . HAND SURGERY  2003  . KNEE SURGERY    . NASAL SINUS SURGERY  2003   x2. Scar tissue build up and had to recreate his tear duct  . PERICARDIECTOMY    . TEE WITHOUT CARDIOVERSION N/A 09/08/2015   Procedure: TRANSESOPHAGEAL ECHOCARDIOGRAM (TEE);  Surgeon: Terry [name];  Location: [facility] ENDOSCOPY;  Service: Cardiovascular;  Laterality: N/A;  . TEE WITHOUT CARDIOVERSION N/A 06/28/2017   Procedure: TRANSESOPHAGEAL ECHOCARDIOGRAM (TEE);  Surgeon: Terry [name];  Location: [facility] ENDOSCOPY;  Service: Cardiovascular;  Laterality: N/A;          Current Outpatient Medications  Medication Sig Dispense Refill  . albuterol (PROVENTIL HFA;VENTOLIN HFA) 108 (90 Base) MCG/ACT inhaler Inhale 2 puffs into the lungs every 4 (four) hours as needed for wheezing or shortness of  breath.    . cetirizine (ZYRTEC ALLERGY) 10 MG tablet Take 10 mg by mouth daily.    [name] ELIQUIS 5 MG TABS tablet TAKE 1 TABLET BY MOUTH TWICE A DAY 60 tablet 11  . fluorouracil (EFUDEX) 5 % cream Apply topically 2 (two) times daily.    . fluticasone (FLONASE) 50 MCG/ACT nasal spray Place 2 sprays into both nostrils daily.    . Nebivolol HCl (BYSTOLIC) 20 MG TABS Take 20 mg by mouth daily.    . Probiotic Product (PROBIOTIC DAILY PO) Take 1 capsule by mouth daily.    . rosuvastatin (CRESTOR) 5 MG tablet Take 5 mg by mouth daily.    . tadalafil (CIALIS) 20 MG tablet Take 1 tablet (20 mg total)  by mouth daily as needed. 15 tablet 5   No current facility-administered medications for this encounter.          Allergies  Allergen Reactions  . Losartan Other (See Comments)    Makes BP drop very low  . Penicillins Other (See Comments)    Childhood reaction Has patient had a PCN reaction causing immediate rash, facial/tongue/throat swelling, SOB or lightheadedness with hypotension: yes Has patient had a PCN reaction causing severe rash involving mucus membranes or skin necrosis: no Has patient had a PCN reaction that required hospitalization: no Has patient had a PCN reaction occurring within the last 10 years: no If all of the above answers are "NO", then may proceed with Cephalosporin use.     Social History        Socioeconomic History  . Marital status: Married    Spouse name: Not on file  . Number of children: Not on file  . Years of education: Not on file  . Highest education level: Not on file  Occupational History  . Not on file  Social Needs  . Financial resource strain: Not on file  . Food insecurity:    Worry: Not on file    Inability: Not on file  . Transportation needs:    Medical: Not on file    Non-medical: Not on file  Tobacco Use  . Smoking status: Never Smoker  . Smokeless tobacco: Never Used  Substance and Sexual Activity  . Alcohol use: Yes    Comment: 2-3 Times a Week Wine with Meals  . Drug use: Never  . Sexual activity: Not on file  Lifestyle  . Physical activity:    Days per week: Not on file    Minutes per session: Not on file  . Stress: Not on file  Relationships  . Social connections:    Talks on phone: Not on file    Gets together: Not on file    Attends religious service: Not on file    Active member of club or organization: Not on file    Attends meetings of clubs or organizations: Not on file    Relationship status: Not on file  . Intimate partner violence:    Fear of current or ex  partner: Not on file    Emotionally abused: Not on file    Physically abused: Not on file    Forced sexual activity: Not on file  Other Topics Concern  . Not on file  Social History Narrative  . Not on file         Family History  Problem Relation Age of Onset  . Hypertension Mother   . Diabetes Mother   . Cancer Mother   . Atrial fibrillation Father   .  Heart disease Father        had pacemaker  . Hypertension Father   . Diabetes Father     ROS- All systems are reviewed and negative except as per the HPI above  Physical Exam:    Vitals:   01/17/18 1119  BP: 118/76  Pulse: 85  Weight: 92.5 kg  Height: 6\' 1"  (1.854 m)      Wt Readings from Last 3 Encounters:  01/17/18 92.5 kg  01/05/18 91.2 kg  01/02/18 93 kg    Labs: RecentLabs       Lab Results  Component Value Date   NA 142 09/20/2017   K 4.3 09/20/2017   CL 103 09/20/2017   CO2 24 09/20/2017   GLUCOSE 91 09/20/2017   BUN 18 09/20/2017   CREATININE 1.00 11/09/2017   CALCIUM 9.1 09/20/2017   MG 1.9 05/31/2017     RecentLabs       Lab Results  Component Value Date   INR 1.0 04/25/2008     RecentLabs       Lab Results  Component Value Date   CHOL 173 04/08/2016   HDL 39 (L) 04/08/2016   LDLCALC 90 04/08/2016   TRIG 220 (H) 04/08/2016       GEN- The patient is well appearing, alert and oriented x 3 today.   Head- normocephalic, atraumatic Eyes-  Sclera clear, conjunctiva pink Ears- hearing intact Oropharynx- clear Neck- supple, no JVP Lymph- no cervical lymphadenopathy Lungs- Clear to ausculation bilaterally, normal work of breathing Heart- irregular rate and rhythm, no murmurs, rubs or gallops, PMI not laterally displaced GI- soft, NT, ND, + BS Extremities- no clubbing, cyanosis, or edema MS- no significant deformity or atrophy Skin- no rash or lesion Psych- euthymic mood, full affect Neuro- strength and sensation are intact  EKG-  afib at 85 bpm, qrs int 79 ms, qtc 449 ms Echo-Study Conclusions  - Left ventricle: The cavity size was normal. Wall thickness was normal. Systolic function was mildly reduced. The estimated ejection fraction was in the range of 45% to 50%. Mild diffuse hypokinesis with no identifiable regional variations. - Ventricular septum: Septal motion showed paradox. - Left atrium: The atrium was mildly dilated. - Right atrium: The atrium was mildly dilated. TEE- 06/2017-Study Conclusions  - Left ventricle: Systolic function was mildly to moderately reduced. The estimated ejection fraction was in the range of 40% to 45%. No evidence of thrombus. - Aortic valve: No evidence of vegetation. - Mitral valve: Mild prolapse, involving the posterior leaflet. No evidence of vegetation. - Left atrium: No evidence of thrombus in the atrial cavity or appendage. No evidence of thrombus in the atrial cavity or appendage. - Right atrium: No evidence of thrombus in the atrial cavity or appendage. - Atrial septum: No defect or patent foramen ovale was identified. - Tricuspid valve: No evidence of vegetation. There was moderate regurgitation. - Pulmonic valve: No evidence of vegetation.  Impressions:  - Successful cardioversion. No cardiac source of emboli was indentified.  Assessment and Plan: 1. Persistent  Afib/asymptomatic but with LV dysfunction Became persistent in the setting of URI in February, had successful cardioversion but has recently returned to afib with  EF around 40% Continues eliquis 5 mg bid for chadsvasc score of at least 4, no missed  doses Continue Bystolic 20 mg daily We discussed options, cardioversion alone or AAD therapy... Since he has had a reduced EF,, best to avoid Multaq and 1C agents He is on the young side for  amiodarone Tiksoyn and sotalol discussed, he decided tikosyn, able to afford drug General precautions re drug discussed  No benadryl  use No qtc prolonging drugs on board Qtc today is acceptable at 449 ms Bmet/mag pending  To 6E when bed is available  I have seen, examined the patient, and reviewed the above assessment and plan.  Changes to above are made where necessary.  On exam, iRRR.  I agree with admissions for tiksosyn.  He reports compliance with eliquis without interruption.  Will follow qt closely while here.  Co Sign: Thompson Grayer, [name] 01/17/2018 4:54 PM

## 2018-01-18 DIAGNOSIS — I10 Essential (primary) hypertension: Secondary | ICD-10-CM

## 2018-01-18 DIAGNOSIS — I519 Heart disease, unspecified: Secondary | ICD-10-CM

## 2018-01-18 LAB — BASIC METABOLIC PANEL
Anion gap: 1 — ABNORMAL LOW (ref 5–15)
BUN: 15 mg/dL (ref 8–23)
CALCIUM: 8.6 mg/dL — AB (ref 8.9–10.3)
CHLORIDE: 109 mmol/L (ref 98–111)
CO2: 30 mmol/L (ref 22–32)
CREATININE: 1.18 mg/dL (ref 0.61–1.24)
GFR calc non Af Amer: >60 mL/min (ref >60)
GLUCOSE: 89 mg/dL (ref 70–99)
Potassium: 4.3 mmol/L (ref 3.5–5.1)
Sodium: 140 mmol/L (ref 135–145)

## 2018-01-18 LAB — MAGNESIUM: Magnesium: 1.9 mg/dL (ref 1.7–2.4)

## 2018-01-18 LAB — HIV ANTIBODY (ROUTINE TESTING W REFLEX): HIV SCREEN 4TH GENERATION: NONREACTIVE

## 2018-01-18 MED ORDER — MAGNESIUM SULFATE 2 GM/50ML IV SOLN
2.0000 g | Freq: Once | INTRAVENOUS | Status: AC
  Administered 2018-01-18: 2 g via INTRAVENOUS
  Filled 2018-01-18: qty 50

## 2018-01-18 NOTE — Progress Notes (Addendum)
Electrophysiology Rounding Note  Patient Name: Terry [name] Date of Encounter: 01/18/2018  Primary Cardiologist: Nahser Electrophysiologist: Jacolby Risby (new this admission)   Subjective   The patient is doing well today.  At this time, the patient denies chest pain, shortness of breath, or any new concerns.  Inpatient Medications    Scheduled Meds: . acidophilus  1 capsule Oral Daily  . apixaban  5 mg Oral BID  . dofetilide  500 mcg Oral BID  . fluticasone  2 spray Each Nare Daily  . Influenza vac split quadrivalent PF  0.5 mL Intramuscular Tomorrow-1000  . nebivolol  20 mg Oral Daily  . rosuvastatin  5 mg Oral Daily  . sodium chloride flush  3 mL Intravenous Q12H   Continuous Infusions: . sodium chloride     PRN Meds: sodium chloride, albuterol, sodium chloride flush   Vital Signs    Vitals:   01/17/18 1502 01/17/18 1943 01/17/18 2106 01/18/18 0532  BP: 97/64 125/83 (!) 123/98   Pulse: 60 85    Temp: (!) 97.5 F (36.4 C) 98.1 F (36.7 C)  98.1 F (36.7 C)  TempSrc: Oral   Oral  SpO2: 94% 97%    Weight: 93.2 kg   93 kg  Height: 6\' 1"  (1.854 m)       Intake/Output Summary (Last 24 hours) at 01/18/2018 0843 Last data filed at 01/18/2018 0100 Gross per 24 hour  Intake 250 ml  Output -  Net 250 ml   Filed Weights   01/17/18 1502 01/18/18 0532  Weight: 93.2 kg 93 kg    Physical Exam    GEN- The patient is well appearing, alert and oriented x 3 today.   Head- normocephalic, atraumatic Eyes-  Sclera clear, conjunctiva pink Ears- hearing intact Oropharynx- clear Neck- supple Lungs- Clear to ausculation bilaterally, normal work of breathing Heart- Irregular rate and rhythm, no murmurs, rubs or gallops GI- soft, NT, ND, + BS Extremities- no clubbing, cyanosis, or edema Skin- no rash or lesion Psych- euthymic mood, full affect Neuro- strength and sensation are intact  Labs    Basic Metabolic Panel Recent Labs    01/17/18 1114 01/18/18 0342  NA  141 140  K 3.9 4.3  CL 108 109  CO2 26 30  GLUCOSE 102* 89  BUN 16 15  CREATININE 1.16 1.18  CALCIUM 8.8* 8.6*  MG 2.1 1.9     Telemetry    Atrial fibrillation (personally reviewed)  Radiology    No results found.   Patient Profile     Terry [name] is a 67 y.o. male admitted for Tikosyn load  Assessment & Plan    1.  Persistent atrial fibrillation Admitted for Tikosyn load BMET, Mg stable QTc stable Continue Eliquis for CHADS2VASC of 4 Plan DCCV tomorrow if still in AF NPO after midnight tonight  2.  HTN Stable No change required today  3.  Chronic systolic heart failure Euvolemic on exam Continue current therapy Repeat echo after 3 months in SR  For questions or updates, please contact [place] Please consult [email or web address] for contact info under Cardiology/STEMI.  Signed, Chanetta Marshall, NP  01/18/2018, 8:43 AM   I have seen, examined the patient, and reviewed the above assessment and plan.  Changes to above are made where necessary.  On exam, iRRR.  Remains in AF.  Continue tikosyn load.  Qt is stable.  NPO after midnight for cardioversion tomorrow if still in AF.  Co Sign: Thompson Grayer, MD 01/18/2018

## 2018-01-19 ENCOUNTER — Encounter (HOSPITAL_COMMUNITY)
Admission: AD | Disposition: A | Discharge status: Home / Self Care | Payer: Self-pay | Source: Home / Self Care | Attending: Internal Medicine

## 2018-01-19 LAB — BASIC METABOLIC PANEL
Anion gap: 8 (ref 5–15)
BUN: 15 mg/dL (ref 8–23)
CALCIUM: 8.6 mg/dL — AB (ref 8.9–10.3)
CO2: 23 mmol/L (ref 22–32)
Chloride: 108 mmol/L (ref 98–111)
Creatinine, Ser: 1.13 mg/dL (ref 0.61–1.24)
Glucose, Bld: 91 mg/dL (ref 70–99)
Potassium: 4.3 mmol/L (ref 3.5–5.1)
SODIUM: 139 mmol/L (ref 135–145)

## 2018-01-19 LAB — MAGNESIUM: MAGNESIUM: 2.3 mg/dL (ref 1.7–2.4)

## 2018-01-19 SURGERY — CARDIOVERSION
Anesthesia: General

## 2018-01-19 MED ORDER — DOFETILIDE 250 MCG PO CAPS
250.0000 ug | ORAL_CAPSULE | Freq: Two times a day (BID) | ORAL | Status: DC
  Administered 2018-01-19 – 2018-01-20 (×3): 250 ug via ORAL
  Filled 2018-01-19 (×3): qty 1

## 2018-01-19 NOTE — Progress Notes (Signed)
pts QTC 529, will notify day shift RN. Will continue to monitor patient.

## 2018-01-19 NOTE — Progress Notes (Addendum)
Electrophysiology Rounding Note  Patient Name: Terry [name] Date of Encounter: 01/19/2018  Primary Cardiologist: Nahser Electrophysiologist: Shakeel Disney   Subjective   The patient is doing well today.  At this time, the patient denies chest pain, shortness of breath, or any new concerns.  Inpatient Medications    Scheduled Meds: . acidophilus  1 capsule Oral Daily  . apixaban  5 mg Oral BID  . dofetilide  250 mcg Oral BID  . fluticasone  2 spray Each Nare Daily  . nebivolol  20 mg Oral Daily  . rosuvastatin  5 mg Oral Daily  . sodium chloride flush  3 mL Intravenous Q12H   Continuous Infusions: . sodium chloride     PRN Meds: sodium chloride, albuterol, sodium chloride flush   Vital Signs    Vitals:   01/18/18 0532 01/18/18 1400 01/18/18 1925 01/19/18 0525  BP:  116/81 132/86 112/74  Pulse:  62 65 (!) 57  Resp:  19    Temp: 98.1 F (36.7 C) 97.7 F (36.5 C) 98.1 F (36.7 C) 98.1 F (36.7 C)  TempSrc: Oral Oral Oral Oral  SpO2:  97% 97% 97%  Weight: 93 kg   93.2 kg  Height:        Intake/Output Summary (Last 24 hours) at 01/19/2018 0739 Last data filed at 01/19/2018 0026 Gross per 24 hour  Intake 525.89 ml  Output 0 ml  Net 525.89 ml   Filed Weights   01/17/18 1502 01/18/18 0532 01/19/18 0525  Weight: 93.2 kg 93 kg 93.2 kg    Physical Exam    GEN- The patient is well appearing, alert and oriented x 3 today.   Head- normocephalic, atraumatic Eyes-  Sclera clear, conjunctiva pink Ears- hearing intact Oropharynx- clear Neck- supple Lungs- Clear to ausculation bilaterally, normal work of breathing Heart- Regular rate and rhythm GI- soft, NT, ND, + BS Extremities- no clubbing, cyanosis, or edema Skin- no rash or lesion Psych- euthymic mood, full affect Neuro- strength and sensation are intact  Labs  Basic Metabolic Panel Recent Labs    01/18/18 0342 01/19/18 0419  NA 140 139  K 4.3 4.3  CL 109 108  CO2 30 23  GLUCOSE 89 91  BUN 15 15    CREATININE 1.18 1.13  CALCIUM 8.6* 8.6*  MG 1.9 2.3     Telemetry    AF -> SR  (personally reviewed)  Radiology    No results found.   Patient Profile     Terry [name] is a 67 y.o. male admitted for Tikosyn load  Assessment & Plan    1.  Persistent atrial fibrillation Converted to SR on Tikosyn QTc long this morning, will decrease dose to 237mcg bid (ok to give this morning's dose per Dr Rayann Heman) BMET, Mg stable Continue Eliquis for CHADS2VASC of 4  2.  HTN Stable No change required today  3.  Chronic systolic heart failure Euvolemic on exam Continue current therapy Repeat echo after 3 months in SR   For questions or updates, please contact [place] Please consult [email or web address] for contact info under Cardiology/STEMI.  Signed, Chanetta Marshall, NP  01/19/2018, 7:39 AM    I have seen, examined the patient, and reviewed the above assessment and plan.  Changes to above are made where necessary.  On exam, RRR.  Now in sinus with tikosyn.  QT has lengthened.  Will reduce tikosyn to 250 mcg BID today and continue to follow.  Co Sign: Thompson Grayer, MD 01/19/2018 10:47 AM

## 2018-01-19 NOTE — Progress Notes (Signed)
pts magnesium level is 1.9 and is due for 3rd tikosyn dose. Paged MD if ok to give dose. Got orders, will continue to monitor patient.

## 2018-01-19 NOTE — Care Management Note (Addendum)
Case Management Note  Patient Details  Name: Terry [name] MRN: [id number] Date of Birth: 04/13/1950  Subjective/Objective: Pt presented for Tikosyn Load: Pt does not have a co pay for Tikosyn. Patient uses [facility] Pt will need Rx for 7 day supply sent to [facility] to be delivered to bedside.  Rx for 30 day supply no refills to be sent to local [facility] and then the 90 Day Rx with refills to [facility].                Action/Plan: No further needs from CM at this time.   Expected Discharge Date:                  Expected Discharge Plan:  Home/Self Care  In-House Referral:  NA  Discharge planning Services  CM Consult, Medication Assistance  Post Acute Care Choice:  NA Choice offered to:  NA  DME Arranged:  N/A DME Agency:  NA  HH Arranged:  NA HH Agency:  NA  Status of Service:  Completed, signed off  If discussed at [place] of Stay Meetings, dates discussed:    Additional Comments:  Bethena Roys, RN 01/19/2018, 12:35 PM

## 2018-01-20 DIAGNOSIS — I428 Other cardiomyopathies: Secondary | ICD-10-CM

## 2018-01-20 LAB — BASIC METABOLIC PANEL
ANION GAP: 5 (ref 5–15)
BUN: 18 mg/dL (ref 8–23)
CALCIUM: 8.6 mg/dL — AB (ref 8.9–10.3)
CO2: 25 mmol/L (ref 22–32)
Chloride: 109 mmol/L (ref 98–111)
Creatinine, Ser: 1.24 mg/dL (ref 0.61–1.24)
GFR, EST NON AFRICAN AMERICAN: 59 mL/min — AB (ref >60)
GLUCOSE: 91 mg/dL (ref 70–99)
Potassium: 4.1 mmol/L (ref 3.5–5.1)
Sodium: 139 mmol/L (ref 135–145)

## 2018-01-20 LAB — MAGNESIUM: MAGNESIUM: 2 mg/dL (ref 1.7–2.4)

## 2018-01-20 MED ORDER — DOFETILIDE 250 MCG PO CAPS: 250.0000 ug | ORAL_CAPSULE | Freq: Two times a day (BID) | ORAL | 6 refills | Status: DC

## 2018-01-20 MED ORDER — DOFETILIDE 250 MCG PO CAPS: 250.0000 ug | ORAL_CAPSULE | Freq: Two times a day (BID) | ORAL | 0 refills | Status: DC

## 2018-01-20 MED FILL — TIKOSYN 250 MCG CAPS: 250 | 7 days supply | Qty: 14 | Fill #0

## 2018-01-20 NOTE — Plan of Care (Signed)
  Problem: Education: Goal: Knowledge of General Education information will improve Description Including pain rating scale, medication(s)/side effects and non-pharmacologic comfort measures Outcome: Completed/Met   Problem: Health Behavior/Discharge Planning: Goal: Ability to manage health-related needs will improve Outcome: Completed/Met   Problem: Clinical Measurements: Goal: Ability to maintain clinical measurements within normal limits will improve Outcome: Completed/Met Goal: Will remain free from infection Outcome: Completed/Met Goal: Diagnostic test results will improve Outcome: Completed/Met Goal: Respiratory complications will improve Outcome: Completed/Met Goal: Cardiovascular complication will be avoided Outcome: Completed/Met   Problem: Activity: Goal: Risk for activity intolerance will decrease Outcome: Completed/Met   Problem: Nutrition: Goal: Adequate nutrition will be maintained Outcome: Completed/Met   Problem: Coping: Goal: Level of anxiety will decrease Outcome: Completed/Met   Problem: Skin Integrity: Goal: Risk for impaired skin integrity will decrease Outcome: Completed/Met   Problem: Education: Goal: Knowledge of disease or condition will improve Outcome: Completed/Met Goal: Understanding of medication regimen will improve Outcome: Completed/Met Goal: Individualized Educational Video(s) Outcome: Completed/Met   Problem: Activity: Goal: Ability to tolerate increased activity will improve Outcome: Completed/Met   Problem: Cardiac: Goal: Ability to achieve and maintain adequate cardiopulmonary perfusion will improve Outcome: Completed/Met   Problem: Health Behavior/Discharge Planning: Goal: Ability to safely manage health-related needs after discharge will improve Outcome: Completed/Met

## 2018-01-20 NOTE — Discharge Instructions (Signed)

## 2018-01-20 NOTE — Discharge Summary (Addendum)
ELECTROPHYSIOLOGY PROCEDURE DISCHARGE SUMMARY    Patient ID: Terry [name],  MRN: [id number], DOB/AGE: 65/65/1952 65 y.o.  Admit date: 01/17/2018 Discharge date: 01/20/2018  Primary Care Physician: Lawerance Cruel, MD  Primary Cardiologist: Dr. Acie Fredrickson Electrophysiologist: new, Dr. Rayann Heman, this admission  Primary Discharge Diagnosis:  1.  persistent atrial fibrillation status post Tikosyn loading this admission      CHA2DS2Vasc is at least 3, (?5 with hx of remote stroke) on Eliquis, appropriately dosed  Secondary Discharge Diagnosis:  1. HTN 2. Unclear h/o prior stroke     central retinal vein occlusion with possible mini-stroke at that time (age 67) s/p laser therapy 3. NICM  Allergies  Allergen Reactions  . Losartan Other (See Comments)    Makes BP drop very low  . Penicillins Other (See Comments)    Childhood reaction Has patient had a PCN reaction causing immediate rash, facial/tongue/throat swelling, SOB or lightheadedness with hypotension: yes Has patient had a PCN reaction causing severe rash involving mucus membranes or skin necrosis: no Has patient had a PCN reaction that required hospitalization: no Has patient had a PCN reaction occurring within the last 10 years: no If all of the above answers are "NO", then may proceed with Cephalosporin use.      Procedures This Admission:  1.  Tikosyn loading   Brief HPI: Terry [name] is a 67 y.o. male with a past medical history as noted above.  They were referred to the AFib clinic in the outpatient setting for treatment options of atrial fibrillation.  Risks, benefits, and alternatives to Tikosyn were reviewed with the patient who wished to proceed.    Hospital Course:  The patient was admitted and Tikosyn was initiated.  Renal function and electrolytes were followed during the hospitalization.  His QTc did lengthen requiring down-titration of drug, remained stable on lowered dose.  He converted with drug  and did not require DCCV. The patient was monitored until discharge on telemetry which demonstrated SR.  On the day of discharge, he reports feeling markedly improved, no CP or SOB, he was were examined by Dr Rayann Heman who considered the patient stable for discharge to home.  Early follow-up has been arranged with the AFib clinic and with Dr Rayann Heman in 4 weeks.   Physical Exam: Vitals:   01/19/18 0525 01/19/18 1626 01/19/18 2137 01/20/18 0626  BP: 112/74 129/84 (!) 128/98 (!) 121/91  Pulse: (!) 57 (!) 57 65 (!) 57  Resp:   20 18  Temp: 98.1 F (36.7 C) 97.8 F (36.6 C) (!) 97.5 F (36.4 C) 98.2 F (36.8 C)  TempSrc: Oral Oral Oral Oral  SpO2: 97%  93% 97%  Weight: 93.2 kg   92 kg  Height:        GEN- The patient is well appearing, alert and oriented x 3 today.   HEENT: normocephalic, atraumatic; sclera clear, conjunctiva pink; hearing intact; oropharynx clear; neck supple, no JVP Lymph- no cervical lymphadenopathy Lungs- Clear to ausculation bilaterally, normal work of breathing.  No wheezes, rales, rhonchi Heart- Regular rate and rhythm, no murmurs, rubs or gallops, PMI not laterally displaced GI- soft, non-tender, non-distended, bowel sounds present, no hepatosplenomegaly Extremities- no clubbing, cyanosis, or edema; DP/PT/radial pulses 2+ bilaterally MS- no significant deformity or atrophy Skin- warm and dry, no rash or lesion Psych- euthymic mood, full affect Neuro- strength and sensation are intact   Labs:   Lab Results  Component Value Date   WBC 6.7 06/14/2017   HGB  16.4 06/14/2017   HCT 49.4 06/14/2017   MCV 90.6 06/14/2017   PLT 300 06/14/2017    Recent Labs  Lab 01/20/18 0216  NA 139  K 4.1  CL 109  CO2 25  BUN 18  CREATININE 1.24  CALCIUM 8.6*  GLUCOSE 91     Discharge Medications:  Allergies as of 01/20/2018      Reactions   Losartan Other (See Comments)   Makes BP drop very low   Penicillins Other (See Comments)   Childhood reaction Has patient  had a PCN reaction causing immediate rash, facial/tongue/throat swelling, SOB or lightheadedness with hypotension: yes Has patient had a PCN reaction causing severe rash involving mucus membranes or skin necrosis: no Has patient had a PCN reaction that required hospitalization: no Has patient had a PCN reaction occurring within the last 10 years: no If all of the above answers are "NO", then may proceed with Cephalosporin use.      Medication List    STOP taking these medications   ALLERTAN PO     TAKE these medications   albuterol 108 (90 Base) MCG/ACT inhaler Commonly known as:  PROVENTIL HFA;VENTOLIN HFA Inhale 2 puffs into the lungs every 4 (four) hours as needed for wheezing or shortness of breath.   BYSTOLIC 20 MG Tabs Generic drug:  Nebivolol HCl Take 20 mg by mouth daily.   CRESTOR 5 MG tablet Generic drug:  rosuvastatin Take 5 mg by mouth daily.   dofetilide 250 MCG capsule Commonly known as:  TIKOSYN Take 1 capsule (250 mcg total) by mouth 2 (two) times daily.   ELIQUIS 5 MG Tabs tablet Generic drug:  apixaban TAKE 1 TABLET BY MOUTH TWICE A DAY What changed:  how much to take   fluorouracil 5 % cream Commonly known as:  EFUDEX Apply topically 2 (two) times daily.   fluticasone 50 MCG/ACT nasal spray Commonly known as:  FLONASE Place 2 sprays into both nostrils every evening.   PROBIOTIC DAILY PO Take 1 capsule by mouth daily.   tadalafil 20 MG tablet Commonly known as:  ADCIRCA/CIALIS Take 1 tablet (20 mg total) by mouth daily as needed.       Disposition: Home Discharge Instructions    Diet - low sodium heart healthy   Complete by:  As directed    Increase activity slowly   Complete by:  As directed      Follow-up Information    [facility].   Specialty:  Cardiology Why:  01/30/18 @ 11:30AM Contact information: [address] [id number] [place] [address] [phone]        Thompson Grayer, MD Follow up.   Specialty:  Cardiology Why:  02/20/18: 8:45AM Contact information: [place] [address] [phone]           Duration of Discharge Encounter: Greater than 30 minutes including physician time.  Signed, Tommye Standard, PA-C 01/20/2018 1:06 PM   I have seen, examined the patient, and reviewed the above assessment and plan.  Changes to above are made where necessary.  On exam, RRR.  Remains in sinus rhythm.  QT is stable. DC to home with close outpatient follow-up in the AF clinic.  Co Sign: Thompson Grayer, MD 01/20/2018

## 2018-01-24 ENCOUNTER — Ambulatory Visit: Payer: 59 | Admitting: Cardiovascular Disease

## 2018-01-30 ENCOUNTER — Encounter (HOSPITAL_COMMUNITY): Payer: Self-pay | Admitting: Nurse Practitioner

## 2018-01-30 ENCOUNTER — Ambulatory Visit (HOSPITAL_COMMUNITY)
Admit: 2018-01-30 | Discharge: 2018-01-30 | Disposition: A | Discharge status: Home / Self Care | Payer: 59 | Source: Ambulatory Visit | Attending: Nurse Practitioner | Admitting: Nurse Practitioner

## 2018-01-30 VITALS — BP 132/88 | HR 53 | Ht 73.0 in | Wt 206.0 lb

## 2018-01-30 DIAGNOSIS — I4819 Other persistent atrial fibrillation: Secondary | ICD-10-CM | POA: Diagnosis not present

## 2018-01-30 DIAGNOSIS — Z833 Family history of diabetes mellitus: Secondary | ICD-10-CM | POA: Diagnosis not present

## 2018-01-30 DIAGNOSIS — Z7901 Long term (current) use of anticoagulants: Secondary | ICD-10-CM | POA: Diagnosis not present

## 2018-01-30 DIAGNOSIS — Z7951 Long term (current) use of inhaled steroids: Secondary | ICD-10-CM | POA: Insufficient documentation

## 2018-01-30 DIAGNOSIS — Z8249 Family history of ischemic heart disease and other diseases of the circulatory system: Secondary | ICD-10-CM | POA: Insufficient documentation

## 2018-01-30 DIAGNOSIS — E785 Hyperlipidemia, unspecified: Secondary | ICD-10-CM | POA: Insufficient documentation

## 2018-01-30 DIAGNOSIS — Z880 Allergy status to penicillin: Secondary | ICD-10-CM | POA: Insufficient documentation

## 2018-01-30 DIAGNOSIS — Z8673 Personal history of transient ischemic attack (TIA), and cerebral infarction without residual deficits: Secondary | ICD-10-CM | POA: Insufficient documentation

## 2018-01-30 DIAGNOSIS — I10 Essential (primary) hypertension: Secondary | ICD-10-CM | POA: Insufficient documentation

## 2018-01-30 DIAGNOSIS — Z79899 Other long term (current) drug therapy: Secondary | ICD-10-CM | POA: Diagnosis not present

## 2018-01-30 DIAGNOSIS — I4891 Unspecified atrial fibrillation: Secondary | ICD-10-CM | POA: Diagnosis present

## 2018-01-30 DIAGNOSIS — Z888 Allergy status to other drugs, medicaments and biological substances status: Secondary | ICD-10-CM | POA: Diagnosis not present

## 2018-01-30 LAB — BASIC METABOLIC PANEL
Anion gap: 3 — ABNORMAL LOW (ref 5–15)
BUN: 15 mg/dL (ref 8–23)
CALCIUM: 8.9 mg/dL (ref 8.9–10.3)
CHLORIDE: 107 mmol/L (ref 98–111)
CO2: 29 mmol/L (ref 22–32)
Creatinine, Ser: 1.02 mg/dL (ref 0.61–1.24)
GFR calc non Af Amer: >60 mL/min (ref >60)
GLUCOSE: 92 mg/dL (ref 70–99)
POTASSIUM: 4 mmol/L (ref 3.5–5.1)
Sodium: 139 mmol/L (ref 135–145)

## 2018-01-30 LAB — MAGNESIUM: Magnesium: 2.1 mg/dL (ref 1.7–2.4)

## 2018-01-30 NOTE — Progress Notes (Signed)
Primary Care Physician: Lawerance Cruel, MD Referring Physician: Sharrell Ku, PA   Terry [name] is a 67 y.o. male with a h/o atrial fibrillation, first occurrence in 2017, with return to [facility] with cardioversion, with return to afib in February with URI. He saw Sharrell Ku at that time. BNP at  was 1200, echo showed EF at 45 % . Pt cut out all salt,lost 5 lbs since that visit. He reports that he is minimally symptomatic with afib, he is rate controlled, chronically on Bystolic.Avid exerciser, usually working out 5x a week. He had stopped his exercise routine since the URI with  return of afib.  He also took decongestants during that time.  F/u in afib clinic, 4/23, s/p successful TEE guided  cardioversion. He continued to remain in SR.    Pt is seen recently with  return of  afib for the last several weeks. He is asymptomatic but picked up afib on his apple watch. He is in the afib clinic to discuss options. He is rate controlled. Pt decided he wanted to go ahead with tikosyn.  F/u 11,5, he is here for admit to hospital for Tikosyn, Can afford drug,  no missed does of anticoagulation, no benadryl use.  F/u in afib clinic, 11/18, he is in [place]. He feels improved.   Today, he denies symptoms of palpitations, chest pain, shortness of breath, orthopnea, PND, lower extremity edema, dizziness, presyncope, syncope, or neurologic sequela. The patient is tolerating medications without difficulties and is otherwise without complaint today.   Past Medical History:  Diagnosis Date  . Aortic root dilation (HCC)    a. mild by CT 10/2017.  [name] Central retinal vein occlusion   . ED (erectile dysfunction)   . H/O cardiac catheterization 2002 - normal cors  . HTN (hypertension)   . Hyperlipidemia   . Hypokalemia   . Leg edema   . LV dysfunction    a. EF appeared mildly depressed by TEE 08/2015, no % given.  . Mini stroke [facility])    a. ? Age 89 at time of central retinal vein occlusion - lost vision in eye  .  PAF (paroxysmal atrial fibrillation) ([place])    a. Dx 06/2015, s/p TEE/DCCV 08/2015.  [name] Pericarditis 2010  . Syncope    Hx of thought to be due to orthostatic hypotension  . Visit for monitoring Tikosyn therapy 01/17/2018   Past Surgical History:  Procedure Laterality Date  . CARDIAC CATHETERIZATION  2002  . CARDIOVERSION N/A 09/08/2015   Procedure: CARDIOVERSION;  Surgeon: Fay Records, MD;  Location: [place];  Service: Cardiovascular;  Laterality: N/A;  . CARDIOVERSION N/A 06/28/2017   Procedure: CARDIOVERSION;  Surgeon: Jerline Pain, MD;  Location[facility] ENDOSCOPY;  Service: Cardiovascular;  Laterality: N/A;  . HAND SURGERY  2003  . KNEE SURGERY    . NASAL SINUS SURGERY  2003   x2. Scar tissue build up and had to recreate his tear duct  . PERICARDIECTOMY    . TEE WITHOUT CARDIOVERSION N/A 09/08/2015   Procedure: TRANSESOPHAGEAL ECHOCARDIOGRAM (TEE);  Surgeon: Fay Records, MD;  Location: [facility] ENDOSCOPY;  Service: Cardiovascular;  Laterality: N/A;  . TEE WITHOUT CARDIOVERSION N/A 06/28/2017   Procedure: TRANSESOPHAGEAL ECHOCARDIOGRAM (TEE);  Surgeon: Jerline Pain, MD;  Location: [facility] ENDOSCOPY;  Service: Cardiovascular;  Laterality: N/A;    Current Outpatient Medications  Medication Sig Dispense Refill  . albuterol (PROVENTIL HFA;VENTOLIN HFA) 108 (90 Base) MCG/ACT inhaler Inhale 2 puffs into the lungs every  4 (four) hours as needed for wheezing or shortness of breath.    . dofetilide (TIKOSYN) 250 MCG capsule Take 1 capsule (250 mcg total) by mouth 2 (two) times daily. 60 capsule 6  . ELIQUIS 5 MG TABS tablet TAKE 1 TABLET BY MOUTH TWICE A DAY 60 tablet 11  . fluorouracil (EFUDEX) 5 % cream Apply topically 2 (two) times daily.    . fluticasone (FLONASE) 50 MCG/ACT nasal spray Place 2 sprays into both nostrils every evening.     . Nebivolol HCl (BYSTOLIC) 20 MG TABS Take 20 mg by mouth daily.    . Probiotic Product (PROBIOTIC DAILY PO) Take 1 capsule by mouth daily.    . rosuvastatin  (CRESTOR) 5 MG tablet Take 5 mg by mouth daily.    . tadalafil (CIALIS) 20 MG tablet Take 1 tablet (20 mg total) by mouth daily as needed. 15 tablet 5   No current facility-administered medications for this encounter.     Allergies  Allergen Reactions  . Losartan Other (See Comments)    Makes BP drop very low  . Penicillins Other (See Comments)    Childhood reaction Has patient had a PCN reaction causing immediate rash, facial/tongue/throat swelling, SOB or lightheadedness with hypotension: yes Has patient had a PCN reaction causing severe rash involving mucus membranes or skin necrosis: no Has patient had a PCN reaction that required hospitalization: no Has patient had a PCN reaction occurring within the last 10 years: no If all of the above answers are "NO", then may proceed with Cephalosporin use.     Social History   Socioeconomic History  . Marital status: Married    Spouse name: Not on file  . Number of children: Not on file  . Years of education: Not on file  . Highest education level: Not on file  Occupational History  . Not on file  Social Needs  . Financial resource strain: Not on file  . Food insecurity:    Worry: Not on file    Inability: Not on file  . Transportation needs:    Medical: Not on file    Non-medical: Not on file  Tobacco Use  . Smoking status: Never Smoker  . Smokeless tobacco: Never Used  Substance and Sexual Activity  . Alcohol use: Yes    Comment: 2-3 Times a Week Wine with Meals  . Drug use: Never  . Sexual activity: Not on file  Lifestyle  . Physical activity:    Days per week: Not on file    Minutes per session: Not on file  . Stress: Not on file  Relationships  . Social connections:    Talks on phone: Not on file    Gets together: Not on file    Attends religious service: Not on file    Active member of club or organization: Not on file    Attends meetings of clubs or organizations: Not on file    Relationship status: Not on  file  . Intimate partner violence:    Fear of current or ex partner: Not on file    Emotionally abused: Not on file    Physically abused: Not on file    Forced sexual activity: Not on file  Other Topics Concern  . Not on file  Social History Narrative  . Not on file    Family History  Problem Relation Age of Onset  . Hypertension Mother   . Diabetes Mother   . Cancer Mother   .  Atrial fibrillation Father   . Heart disease Father        had pacemaker  . Hypertension Father   . Diabetes Father     ROS- All systems are reviewed and negative except as per the HPI above  Physical Exam: Vitals:   01/30/18 1148  BP: 132/88  Pulse: (!) 53  Weight: 93.4 kg  Height: 6\' 1"  (1.854 m)   Wt Readings from Last 3 Encounters:  01/30/18 93.4 kg  01/20/18 92 kg  01/17/18 92.5 kg    Labs: Lab Results  Component Value Date   NA 139 01/20/2018   K 4.1 01/20/2018   CL 109 01/20/2018   CO2 25 01/20/2018   GLUCOSE 91 01/20/2018   BUN 18 01/20/2018   CREATININE 1.24 01/20/2018   CALCIUM 8.6 (L) 01/20/2018   MG 2.0 01/20/2018   Lab Results  Component Value Date   INR 1.0 04/25/2008   Lab Results  Component Value Date   CHOL 173 04/08/2016   HDL 39 (L) 04/08/2016   LDLCALC 90 04/08/2016   TRIG 220 (H) 04/08/2016     GEN- The patient is well appearing, alert and oriented x 3 today.   Head- normocephalic, atraumatic Eyes-  Sclera clear, conjunctiva pink Ears- hearing intact Oropharynx- clear Neck- supple, no JVP Lymph- no cervical lymphadenopathy Lungs- Clear to ausculation bilaterally, normal work of breathing Heart-  regular rate and rhythm, no murmurs, rubs or gallops, PMI not laterally displaced GI- soft, NT, ND, + BS Extremities- no clubbing, cyanosis, or edema MS- no significant deformity or atrophy Skin- no rash or lesion Psych- euthymic mood, full affect Neuro- strength and sensation are intact  EKG- sinus brady at 53 bpm, PR int 196 ms, qrs int 82 ms, qtc  450 ms Echo-Study Conclusions  - Left ventricle: The cavity size was normal. Wall thickness was   normal. Systolic function was mildly reduced. The estimated   ejection fraction was in the range of 45% to 50%. Mild diffuse   hypokinesis with no identifiable regional variations. - Ventricular septum: Septal motion showed paradox. - Left atrium: The atrium was mildly dilated. - Right atrium: The atrium was mildly dilated. TEE- 06/2017-Study Conclusions  - Left ventricle: Systolic function was mildly to moderately   reduced. The estimated ejection fraction was in the range of 40%   to 45%. No evidence of thrombus. - Aortic valve: No evidence of vegetation. - Mitral valve: Mild prolapse, involving the posterior leaflet. No   evidence of vegetation. - Left atrium: No evidence of thrombus in the atrial cavity or   appendage. No evidence of thrombus in the atrial cavity or   appendage. - Right atrium: No evidence of thrombus in the atrial cavity or   appendage. - Atrial septum: No defect or patent foramen ovale was identified. - Tricuspid valve: No evidence of vegetation. There was moderate   regurgitation. - Pulmonic valve: No evidence of vegetation.  Impressions:  - Successful cardioversion. No cardiac source of emboli was   indentified.  Assessment and Plan: 1. Persistent  Afib/asymptomatic but with LV dysfunction Became persistent in the setting of URI in February 2019, had successful cardioversion but  returned to afib with  EF around 40% Continues eliquis 5 mg bid for chadsvasc score of at least 4, no missed  doses Continue Bystolic 20 mg daily Tiksoyn recently successful loaded and in SR with acceptable qtc General precautions re drug discussed  No benadryl use Bmet/mag pending  F/u with Dr. Rayann Heman  12/9    Terry [name], [facility] [address] [place], [place] [address] [phone]

## 2018-02-20 ENCOUNTER — Ambulatory Visit (INDEPENDENT_AMBULATORY_CARE_PROVIDER_SITE_OTHER): Payer: 59 | Admitting: Internal Medicine

## 2018-02-20 ENCOUNTER — Encounter: Payer: Self-pay | Admitting: Internal Medicine

## 2018-02-20 VITALS — BP 130/84 | HR 56 | Ht 73.0 in | Wt 203.6 lb

## 2018-02-20 DIAGNOSIS — I10 Essential (primary) hypertension: Secondary | ICD-10-CM

## 2018-02-20 DIAGNOSIS — I429 Cardiomyopathy, unspecified: Secondary | ICD-10-CM

## 2018-02-20 DIAGNOSIS — I4819 Other persistent atrial fibrillation: Secondary | ICD-10-CM | POA: Diagnosis not present

## 2018-02-20 DIAGNOSIS — I5022 Chronic systolic (congestive) heart failure: Secondary | ICD-10-CM

## 2018-02-20 NOTE — Progress Notes (Signed)
Electrophysiology Office Note Date: 02/20/2018  ID:  Terry [name], DOB [birth year], MRN [id number]  PCP: Lawerance Cruel, MD Primary Cardiologist: Dr Cathie Olden Electrophysiologist: Dr Rayann Heman  CC: Follow up for atrial fibrillation  Terry [name] is a 67 y.o. male seen today for routine electrophysiology followup. Patient is s/p Tikosyn loading 01/2018. He reports a huge improvement in his energy level since converting to SR. He remains very active and exercises regularly. Since last being seen in our clinic, the patient reports doing very well.    He denies chest pain, palpitations, dyspnea, PND, orthopnea, nausea, vomiting, dizziness, syncope, edema, weight gain, or early satiety.  Past Medical History:  Diagnosis Date  . Aortic root dilation (HCC)    a. mild by CT 10/2017.  [name] Central retinal vein occlusion   . ED (erectile dysfunction)   . H/O cardiac catheterization 2002 - normal cors  . HTN (hypertension)   . Hyperlipidemia   . Hypokalemia   . Leg edema   . LV dysfunction    a. EF appeared mildly depressed by TEE 08/2015, no % given.  . Mini stroke [facility])    a. ? Age 29 at time of central retinal vein occlusion - lost vision in eye  . PAF (paroxysmal atrial fibrillation) ([place])    a. Dx 06/2015, s/p TEE/DCCV 08/2015.  [name] Pericarditis 2010  . Syncope    Hx of thought to be due to orthostatic hypotension  . Visit for monitoring Tikosyn therapy 01/17/2018   Past Surgical History:  Procedure Laterality Date  . CARDIAC CATHETERIZATION  2002  . CARDIOVERSION N/A 09/08/2015   Procedure: CARDIOVERSION;  Surgeon: Fay Records, MD;  Location: [facility];  Service: Cardiovascular;  Laterality: N/A;  . CARDIOVERSION N/A 06/28/2017   Procedure: CARDIOVERSION;  Surgeon: Jerline Pain, MD;  Location: [facility] ENDOSCOPY;  Service: Cardiovascular;  Laterality: N/A;  . HAND SURGERY  2003  . KNEE SURGERY    . NASAL SINUS SURGERY  2003   x2. Scar tissue build up and had to recreate his  tear duct  . PERICARDIECTOMY    . TEE WITHOUT CARDIOVERSION N/A 09/08/2015   Procedure: TRANSESOPHAGEAL ECHOCARDIOGRAM (TEE);  Surgeon: Fay Records, MD;  Location: [facility] ENDOSCOPY;  Service: Cardiovascular;  Laterality: N/A;  . TEE WITHOUT CARDIOVERSION N/A 06/28/2017   Procedure: TRANSESOPHAGEAL ECHOCARDIOGRAM (TEE);  Surgeon: Jerline Pain, MD;  Location: [facility] ENDOSCOPY;  Service: Cardiovascular;  Laterality: N/A;    Current Outpatient Medications  Medication Sig Dispense Refill  . albuterol (PROVENTIL HFA;VENTOLIN HFA) 108 (90 Base) MCG/ACT inhaler Inhale 2 puffs into the lungs every 4 (four) hours as needed for wheezing or shortness of breath.    . dofetilide (TIKOSYN) 250 MCG capsule Take 1 capsule (250 mcg total) by mouth 2 (two) times daily. 60 capsule 6  . ELIQUIS 5 MG TABS tablet TAKE 1 TABLET BY MOUTH TWICE A DAY 60 tablet 11  . fluorouracil (EFUDEX) 5 % cream Apply topically 2 (two) times daily.    . fluticasone (FLONASE) 50 MCG/ACT nasal spray Place 2 sprays into both nostrils every evening.     . Nebivolol HCl (BYSTOLIC) 20 MG TABS Take 20 mg by mouth daily.    . Probiotic Product (PROBIOTIC DAILY PO) Take 1 capsule by mouth daily.    . rosuvastatin (CRESTOR) 5 MG tablet Take 5 mg by mouth daily.    . tadalafil (CIALIS) 20 MG tablet Take 1 tablet (20 mg total) by mouth daily as needed. 15  tablet 5   No current facility-administered medications for this visit.     Allergies:   Losartan and Penicillins   Social History: Social History   Socioeconomic History  . Marital status: Married    Spouse name: Not on file  . Number of children: Not on file  . Years of education: Not on file  . Highest education level: Not on file  Occupational History  . Not on file  Social Needs  . Financial resource strain: Not on file  . Food insecurity:    Worry: Not on file    Inability: Not on file  . Transportation needs:    Medical: Not on file    Non-medical: Not on file  Tobacco Use    . Smoking status: Never Smoker  . Smokeless tobacco: Never Used  Substance and Sexual Activity  . Alcohol use: Yes    Comment: 2-3 Times a Week Wine with Meals  . Drug use: Never  . Sexual activity: Not on file  Lifestyle  . Physical activity:    Days per week: Not on file    Minutes per session: Not on file  . Stress: Not on file  Relationships  . Social connections:    Talks on phone: Not on file    Gets together: Not on file    Attends religious service: Not on file    Active member of club or organization: Not on file    Attends meetings of clubs or organizations: Not on file    Relationship status: Not on file  . Intimate partner violence:    Fear of current or ex partner: Not on file    Emotionally abused: Not on file    Physically abused: Not on file    Forced sexual activity: Not on file  Other Topics Concern  . Not on file  Social History Narrative  . Not on file    Family History: Family History  Problem Relation Age of Onset  . Hypertension Mother   . Diabetes Mother   . Cancer Mother   . Atrial fibrillation Father   . Heart disease Father        had pacemaker  . Hypertension Father   . Diabetes Father     Review of Systems: All other systems reviewed and are otherwise negative except as noted above.   Physical Exam: VS:  BP 130/84   Pulse (!) 56   Ht 6\' 1"  (1.854 m)   Wt 203 lb 9.6 oz (92.4 kg)   SpO2 98%   BMI 26.86 kg/m  , BMI Body mass index is 26.86 kg/m. Wt Readings from Last 3 Encounters:  02/20/18 203 lb 9.6 oz (92.4 kg)  01/30/18 206 lb (93.4 kg)  01/20/18 202 lb 12.8 oz (92 kg)    GEN- The patient is well appearing, alert and oriented x 3 today.   HEENT: normocephalic, atraumatic; sclera clear, conjunctiva pink; hearing intact; oropharynx clear; neck supple, no JVP Lungs- Clear to ausculation bilaterally, normal work of breathing.  No wheezes, rales, rhonchi Heart- Regular rate and rhythm, no murmurs, rubs or  gallops Extremities- no clubbing, cyanosis, or edema MS- no significant deformity or atrophy Skin- warm and dry, no rash or lesion  Psych- euthymic mood, full affect Neuro- strength and sensation are intact   EKG:  EKG is ordered today. The ekg ordered today shows sinus bradycardia HR 56, PR 194, QRS 86, QTc 451  Recent Labs: 05/31/2017: NT-Pro BNP 1,280; TSH 3.390 06/14/2017:  ALT 30; B Natriuretic Peptide 101.2; Hemoglobin 16.4; Platelets 300 01/30/2018: BUN 15; Creatinine, Ser 1.02; Magnesium 2.1; Potassium 4.0; Sodium 139    Other studies Reviewed: Additional studies/ records that were reviewed today include: hospital admission notes  Assessment and Plan:  1. Persistent atrial fibrillation Became persistent in setting of URI in Feb 2019. Tikosyn loading 01/2018. Continue Tikosyn 251mcg BID In SR today, QT stable CHADS2VASC score of at least 4 Continue Eliquis 5mg  BID Continue Bystolic 20mg  daily Bmet/magnesium in 3 months  2. HTN Stable, no changes today  3. Chronic systolic heart failure Most recent 05/2017 EF 45-50% No signs of fluid overload.  Repeat echo in 3 months   Current medicines are reviewed at length with the patient today.   The patient does not have concerns regarding his medicines.  The following changes were made today: none  Labs/ tests ordered today include:  Orders Placed This Encounter  Procedures  . EKG 12-Lead     Disposition:   Follow up with in the Afib Clinic in 3 months. Follow up with Dr Cathie Olden in 6 months.   Terry Fossa MD 02/20/2018 9:35 AM   [facility] [address] [place] [address] [phone] (office) [phone] (fax)

## 2018-02-20 NOTE — Patient Instructions (Signed)
Medication Instructions:  Your physician recommends that you continue on your current medications as directed. Please refer to the Current Medication list given to you today.  Labwork: None ordered.  Testing/Procedures: None ordered.  Follow-Up: Your physician recommends that you schedule a follow-up appointment in:   3 months with Roderic Palau in our A Fib clinic.   Any Other Special Instructions Will Be Listed Below (If Applicable).     If you need a refill on your cardiac medications before your next appointment, please call your pharmacy.

## 2018-02-23 ENCOUNTER — Other Ambulatory Visit: Payer: Self-pay

## 2018-02-24 ENCOUNTER — Other Ambulatory Visit: Payer: Self-pay | Admitting: *Deleted

## 2018-02-24 MED ORDER — DOFETILIDE 250 MCG PO CAPS: 250.0000 ug | ORAL_CAPSULE | Freq: Two times a day (BID) | ORAL | 3 refills | Status: DC

## 2018-03-30 ENCOUNTER — Telehealth: Payer: Self-pay

## 2018-03-30 NOTE — Telephone Encounter (Signed)
PA submitted through Cover my meds for Eliquis Key: [id number]  Approved 02/28/18 through 03/30/2019

## 2018-04-05 ENCOUNTER — Other Ambulatory Visit: Payer: Self-pay | Admitting: Dermatology

## 2018-05-22 ENCOUNTER — Encounter (HOSPITAL_COMMUNITY): Payer: Self-pay | Admitting: Physician Assistant

## 2018-05-22 ENCOUNTER — Other Ambulatory Visit: Payer: Self-pay

## 2018-05-22 ENCOUNTER — Ambulatory Visit (HOSPITAL_COMMUNITY)
Admission: RE | Admit: 2018-05-22 | Discharge: 2018-05-22 | Disposition: A | Discharge status: Home / Self Care | Payer: 59 | Source: Ambulatory Visit | Attending: Nurse Practitioner | Admitting: Nurse Practitioner

## 2018-05-22 VITALS — BP 108/72 | HR 59 | Ht 73.0 in | Wt 204.0 lb

## 2018-05-22 DIAGNOSIS — Z7901 Long term (current) use of anticoagulants: Secondary | ICD-10-CM | POA: Diagnosis not present

## 2018-05-22 DIAGNOSIS — Z880 Allergy status to penicillin: Secondary | ICD-10-CM | POA: Diagnosis not present

## 2018-05-22 DIAGNOSIS — I5022 Chronic systolic (congestive) heart failure: Secondary | ICD-10-CM | POA: Diagnosis not present

## 2018-05-22 DIAGNOSIS — Z79899 Other long term (current) drug therapy: Secondary | ICD-10-CM | POA: Diagnosis not present

## 2018-05-22 DIAGNOSIS — E785 Hyperlipidemia, unspecified: Secondary | ICD-10-CM | POA: Diagnosis not present

## 2018-05-22 DIAGNOSIS — Z8673 Personal history of transient ischemic attack (TIA), and cerebral infarction without residual deficits: Secondary | ICD-10-CM | POA: Insufficient documentation

## 2018-05-22 DIAGNOSIS — Z833 Family history of diabetes mellitus: Secondary | ICD-10-CM | POA: Insufficient documentation

## 2018-05-22 DIAGNOSIS — Z7951 Long term (current) use of inhaled steroids: Secondary | ICD-10-CM | POA: Insufficient documentation

## 2018-05-22 DIAGNOSIS — I4819 Other persistent atrial fibrillation: Secondary | ICD-10-CM | POA: Insufficient documentation

## 2018-05-22 DIAGNOSIS — Z888 Allergy status to other drugs, medicaments and biological substances status: Secondary | ICD-10-CM | POA: Insufficient documentation

## 2018-05-22 DIAGNOSIS — I110 Hypertensive heart disease with heart failure: Secondary | ICD-10-CM | POA: Insufficient documentation

## 2018-05-22 DIAGNOSIS — Z8249 Family history of ischemic heart disease and other diseases of the circulatory system: Secondary | ICD-10-CM | POA: Diagnosis not present

## 2018-05-22 LAB — BASIC METABOLIC PANEL
Anion gap: 8 (ref 5–15)
BUN: 16 mg/dL (ref 8–23)
CALCIUM: 8.9 mg/dL (ref 8.9–10.3)
CO2: 26 mmol/L (ref 22–32)
CREATININE: 1.11 mg/dL (ref 0.61–1.24)
Chloride: 106 mmol/L (ref 98–111)
GFR calc Af Amer: >60 mL/min (ref >60)
Glucose, Bld: 99 mg/dL (ref 70–99)
Potassium: 3.8 mmol/L (ref 3.5–5.1)
SODIUM: 140 mmol/L (ref 135–145)

## 2018-05-22 LAB — MAGNESIUM: MAGNESIUM: 2.1 mg/dL (ref 1.7–2.4)

## 2018-05-22 NOTE — Progress Notes (Signed)
Primary Care Physician: Lawerance Cruel, MD Referring Physician: Sharrell Ku, PA Primary EP: Dr Roanna Epley Busch is a 68 y.o. male with a h/o atrial fibrillation, first occurrence in 2017, with return to [place] with cardioversion, with return to afib in February with URI. He saw Sharrell Ku at that time. BNP at  was 1200, echo showed EF at 45 % . Pt cut out all salt,lost 5 lbs since that visit. He reports that he is minimally symptomatic with afib, he is rate controlled, chronically on Bystolic. Avid exerciser, usually working out 5x a week. He had stopped his exercise routine since the URI with  return of afib.  He also took decongestants during that time.   S/p dofetilide loading 01/2018. He reports that he is feeling very well overall with increased exercise tolerance and no palpitations since starting the dofetilide. He is in SR today.  Today, he denies symptoms of palpitations, chest pain, shortness of breath, orthopnea, PND, lower extremity edema, dizziness, presyncope, syncope, or neurologic sequela. The patient is tolerating medications without difficulties and is otherwise without complaint today.   Past Medical History:  Diagnosis Date  . Aortic root dilation (HCC)    a. mild by CT 10/2017.  [name] Central retinal vein occlusion   . ED (erectile dysfunction)   . H/O cardiac catheterization 2002 - normal cors  . HTN (hypertension)   . Hyperlipidemia   . Hypokalemia   . Leg edema   . LV dysfunction    a. EF appeared mildly depressed by TEE 08/2015, no % given.  . Mini stroke [facility])    a. ? Age 40 at time of central retinal vein occlusion - lost vision in eye  . PAF (paroxysmal atrial fibrillation) ([place])    a. Dx 06/2015, s/p TEE/DCCV 08/2015.  [name] Pericarditis 2010  . Syncope    Hx of thought to be due to orthostatic hypotension  . Visit for monitoring Tikosyn therapy 01/17/2018   Past Surgical History:  Procedure Laterality Date  . CARDIAC CATHETERIZATION  2002  .  CARDIOVERSION N/A 09/08/2015   Procedure: CARDIOVERSION;  Surgeon: Fay Records, MD;  Location: [facility];  Service: Cardiovascular;  Laterality: N/A;  . CARDIOVERSION N/A 06/28/2017   Procedure: CARDIOVERSION;  Surgeon: Jerline Pain, MD;  Location: [facility] ENDOSCOPY;  Service: Cardiovascular;  Laterality: N/A;  . HAND SURGERY  2003  . KNEE SURGERY    . NASAL SINUS SURGERY  2003   x2. Scar tissue build up and had to recreate his tear duct  . PERICARDIECTOMY    . TEE WITHOUT CARDIOVERSION N/A 09/08/2015   Procedure: TRANSESOPHAGEAL ECHOCARDIOGRAM (TEE);  Surgeon: Fay Records, MD;  Location: [facility] ENDOSCOPY;  Service: Cardiovascular;  Laterality: N/A;  . TEE WITHOUT CARDIOVERSION N/A 06/28/2017   Procedure: TRANSESOPHAGEAL ECHOCARDIOGRAM (TEE);  Surgeon: Jerline Pain, MD;  Location: [facility] ENDOSCOPY;  Service: Cardiovascular;  Laterality: N/A;    Current Outpatient Medications  Medication Sig Dispense Refill  . albuterol (PROVENTIL HFA;VENTOLIN HFA) 108 (90 Base) MCG/ACT inhaler Inhale 2 puffs into the lungs every 4 (four) hours as needed for wheezing or shortness of breath.    . dofetilide (TIKOSYN) 250 MCG capsule Take 1 capsule (250 mcg total) by mouth 2 (two) times daily. 180 capsule 3  . ELIQUIS 5 MG TABS tablet TAKE 1 TABLET BY MOUTH TWICE A DAY 60 tablet 11  . fluorouracil (EFUDEX) 5 % cream Apply topically 2 (two) times daily.    . fluticasone (  FLONASE) 50 MCG/ACT nasal spray Place 2 sprays into both nostrils every evening.     . Nebivolol HCl (BYSTOLIC) 20 MG TABS Take 20 mg by mouth daily.    . Probiotic Product (PROBIOTIC DAILY PO) Take 1 capsule by mouth daily.    . rosuvastatin (CRESTOR) 5 MG tablet Take 5 mg by mouth daily.    . tadalafil (CIALIS) 20 MG tablet Take 1 tablet (20 mg total) by mouth daily as needed. 15 tablet 5   No current facility-administered medications for this encounter.     Allergies  Allergen Reactions  . Losartan Other (See Comments)    Makes BP drop very  low  . Penicillins Other (See Comments)    Childhood reaction Has patient had a PCN reaction causing immediate rash, facial/tongue/throat swelling, SOB or lightheadedness with hypotension: yes Has patient had a PCN reaction causing severe rash involving mucus membranes or skin necrosis: no Has patient had a PCN reaction that required hospitalization: no Has patient had a PCN reaction occurring within the last 10 years: no If all of the above answers are "NO", then may proceed with Cephalosporin use.     Social History   Socioeconomic History  . Marital status: Married    Spouse name: Not on file  . Number of children: Not on file  . Years of education: Not on file  . Highest education level: Not on file  Occupational History  . Not on file  Social Needs  . Financial resource strain: Not on file  . Food insecurity:    Worry: Not on file    Inability: Not on file  . Transportation needs:    Medical: Not on file    Non-medical: Not on file  Tobacco Use  . Smoking status: Never Smoker  . Smokeless tobacco: Never Used  Substance and Sexual Activity  . Alcohol use: Yes    Comment: 2-3 Times a Week Wine with Meals  . Drug use: Never  . Sexual activity: Not on file  Lifestyle  . Physical activity:    Days per week: Not on file    Minutes per session: Not on file  . Stress: Not on file  Relationships  . Social connections:    Talks on phone: Not on file    Gets together: Not on file    Attends religious service: Not on file    Active member of club or organization: Not on file    Attends meetings of clubs or organizations: Not on file    Relationship status: Not on file  . Intimate partner violence:    Fear of current or ex partner: Not on file    Emotionally abused: Not on file    Physically abused: Not on file    Forced sexual activity: Not on file  Other Topics Concern  . Not on file  Social History Narrative  . Not on file    Family History  Problem Relation  Age of Onset  . Hypertension Mother   . Diabetes Mother   . Cancer Mother   . Atrial fibrillation Father   . Heart disease Father        had pacemaker  . Hypertension Father   . Diabetes Father     ROS- All systems are reviewed and negative except as per the HPI above  Physical Exam: Vitals:   05/22/18 1537  BP: 108/72  Pulse: (!) 59  Weight: 92.5 kg  Height: 6\' 1"  (1.854 m)  Wt Readings from Last 3 Encounters:  05/22/18 92.5 kg  02/20/18 92.4 kg  01/30/18 93.4 kg    Labs: Lab Results  Component Value Date   NA 139 01/30/2018   K 4.0 01/30/2018   CL 107 01/30/2018   CO2 29 01/30/2018   GLUCOSE 92 01/30/2018   BUN 15 01/30/2018   CREATININE 1.02 01/30/2018   CALCIUM 8.9 01/30/2018   MG 2.1 01/30/2018   Lab Results  Component Value Date   INR 1.0 04/25/2008   Lab Results  Component Value Date   CHOL 173 04/08/2016   HDL 39 (L) 04/08/2016   LDLCALC 90 04/08/2016   TRIG 220 (H) 04/08/2016     GEN- The patient is well appearing, alert and oriented x 3 today.   HEENT-head normocephalic, atraumatic, sclera clear, conjunctiva pink, hearing intact, trachea midline. Lungs- Clear to ausculation bilaterally, normal work of breathing Heart- Regular rate and rhythm, no murmurs, rubs or gallops  GI- soft, NT, ND, + BS Extremities- no clubbing, cyanosis, or edema MS- no significant deformity or atrophy Skin- no rash or lesion Psych- euthymic mood, full affect Neuro- strength and sensation are intact   EKG- sinus bradycardia HR 59, PR 192, QRS 84, QTc 451  Echo-Study Conclusions  - Left ventricle: The cavity size was normal. Wall thickness was   normal. Systolic function was mildly reduced. The estimated   ejection fraction was in the range of 45% to 50%. Mild diffuse   hypokinesis with no identifiable regional variations. - Ventricular septum: Septal motion showed paradox. - Left atrium: The atrium was mildly dilated. - Right atrium: The atrium was mildly  dilated. TEE- 06/2017-Study Conclusions  - Left ventricle: Systolic function was mildly to moderately   reduced. The estimated ejection fraction was in the range of 40%   to 45%. No evidence of thrombus. - Aortic valve: No evidence of vegetation. - Mitral valve: Mild prolapse, involving the posterior leaflet. No   evidence of vegetation. - Left atrium: No evidence of thrombus in the atrial cavity or   appendage. No evidence of thrombus in the atrial cavity or   appendage. - Right atrium: No evidence of thrombus in the atrial cavity or   appendage. - Atrial septum: No defect or patent foramen ovale was identified. - Tricuspid valve: No evidence of vegetation. There was moderate   regurgitation. - Pulmonic valve: No evidence of vegetation.  Impressions:  - Successful cardioversion. No cardiac source of emboli was   indentified.  Assessment and Plan: 1. Persistent  Afib S/p dofetilide loading 01/2018. Doing well with no symptoms of afib at this time. Continue dofetilide 250 mcg BID Continue Bystolic 20 mg daily Continues eliquis 5 mg bid for chadsvasc score of at least 4 Bmet/mag today  2. HTN Stable, no changes today.  3. Chronic systolic CHF EF 27-74% Recheck echo now that he is back in SR.   F/u with Afib Clinic in 3 months.  [facility] [address] [place], [place] [address] [phone]

## 2018-05-23 ENCOUNTER — Other Ambulatory Visit (HOSPITAL_COMMUNITY): Payer: Self-pay | Admitting: *Deleted

## 2018-05-23 MED ORDER — POTASSIUM CHLORIDE ER 10 MEQ PO TBCR: 10.0000 meq | EXTENDED_RELEASE_TABLET | Freq: Every day | ORAL | 3 refills | Status: DC

## 2018-06-05 ENCOUNTER — Other Ambulatory Visit (HOSPITAL_COMMUNITY): Payer: 59 | Admitting: Physician Assistant

## 2018-06-05 ENCOUNTER — Ambulatory Visit (HOSPITAL_COMMUNITY): Payer: 59

## 2018-07-28 ENCOUNTER — Encounter: Payer: Self-pay | Admitting: Physician Assistant

## 2018-07-28 ENCOUNTER — Telehealth: Payer: Self-pay | Admitting: Physician Assistant

## 2018-07-28 DIAGNOSIS — I5022 Chronic systolic (congestive) heart failure: Secondary | ICD-10-CM | POA: Insufficient documentation

## 2018-07-28 DIAGNOSIS — I341 Nonrheumatic mitral (valve) prolapse: Secondary | ICD-10-CM | POA: Insufficient documentation

## 2018-07-28 DIAGNOSIS — I429 Cardiomyopathy, unspecified: Secondary | ICD-10-CM | POA: Insufficient documentation

## 2018-07-28 NOTE — Telephone Encounter (Signed)
Patient set up for MyChart?  Yes consent was sent to mychart   Is patient using Smartphone/computer/tablet? yes  Did audio/video work?   Does patient need telephone visit? No   Best phone number to [phone]  Special Instructions? Patient will have vitals when nurse calls       Virtual Visit Pre-Appointment Phone Call  "(Name), I am calling you today to discuss your upcoming appointment. We are currently trying to limit exposure to the virus that causes COVID-19 by seeing patients at home rather than in the office."  1. "What is the BEST phone number to call the day of the visit?" - include this in appointment notes  2. Do you have or have access to (through a family member/friend) a smartphone with video capability that we can use for your visit?" a. If yes - list this number in appt notes as cell (if different from BEST phone #) and list the appointment type as a VIDEO visit in appointment notes b. If no - list the appointment type as a PHONE visit in appointment notes  3. Confirm consent - "In the setting of the current Covid19 crisis, you are scheduled for a (phone or video) visit with your provider on (date) at (time).  Just as we do with many in-office visits, in order for you to participate in this visit, we must obtain consent.  If you'd like, I can send this to your mychart (if signed up) or email for you to review.  Otherwise, I can obtain your verbal consent now.  All virtual visits are billed to your insurance company just like a normal visit would be.  By agreeing to a virtual visit, we'd like you to understand that the technology does not allow for your provider to perform an examination, and thus may limit your provider's ability to fully assess your condition. If your provider identifies any concerns that need to be evaluated in person, we will make arrangements to do so.  Finally, though the technology is pretty good, we cannot assure that it will always work on  either your or our end, and in the setting of a video visit, we may have to convert it to a phone-only visit.  In either situation, we cannot ensure that we have a secure connection.  Are you willing to proceed?" STAFF: Did the patient verbally acknowledge consent to telehealth visit? Document YES/NO here: yes  4. Advise patient to be prepared - "Two hours prior to your appointment, go ahead and check your blood pressure, pulse, oxygen saturation, and your weight (if you have the equipment to check those) and write them all down. When your visit starts, your provider will ask you for this information. If you have an Apple Watch or Kardia device, please plan to have heart rate information ready on the day of your appointment. Please have a pen and paper handy nearby the day of the visit as well."  5. Give patient instructions for MyChart download to smartphone OR [email or web address] as below if video visit (depending on what platform provider is using)  6. Inform patient they will receive a phone call 15 minutes prior to their appointment time (may be from unknown caller ID) so they should be prepared to answer    TELEPHONE CALL NOTE  Terry [name] has been deemed a candidate for a follow-up tele-health visit to limit community exposure during the Covid-19 pandemic. I spoke with the patient via phone to ensure availability of phone/video source, confirm preferred email &  phone number, and discuss instructions and expectations.  I reminded Terry [name] to be prepared with any vital sign and/or heart rhythm information that could potentially be obtained via home monitoring, at the time of his visit. I reminded Terry [name] to expect a phone call prior to his visit.  Terry [name] 07/28/2018 8:43 AM   INSTRUCTIONS FOR DOWNLOADING THE MYCHART APP TO SMARTPHONE  - The patient must first make sure to have activated MyChart and know their login information - If Apple, go to [employer] and type in  MyChart in the search bar and download the app. If Android, ask patient to go to [employer] and type in [place] in the search bar and download the app. The app is free but as with any other app downloads, their phone may require them to verify saved payment information or Apple/Android password.  - The patient will need to then log into the app with their MyChart username and password, and select [facility] as their healthcare provider to link the account. When it is time for your visit, go to the MyChart app, find appointments, and click Begin Video Visit. Be sure to Select Allow for your device to access the Microphone and Camera for your visit. You will then be connected, and your provider will be with you shortly.  **If they have any issues connecting, or need assistance please contact MyChart service desk [phone])**  **If using a computer, in order to ensure the best quality for their visit they will need to use either of the following Internet Browsers: [employer], or Google Chrome**  IF USING DOXIMITY or DOXY.ME - The patient will receive a link just prior to their visit by text.     FULL LENGTH CONSENT FOR TELE-HEALTH VISIT   I hereby voluntarily request, consent and authorize [facility] and its employed or contracted physicians, physician assistants, nurse practitioners or other licensed health care professionals (the Practitioner), to provide me with telemedicine health care services (the Services") as deemed necessary by the treating Practitioner. I acknowledge and consent to receive the Services by the Practitioner via telemedicine. I understand that the telemedicine visit will involve communicating with the Practitioner through live audiovisual communication technology and the disclosure of certain medical information by electronic transmission. I acknowledge that I have been given the opportunity to request an in-person assessment or other available  alternative prior to the telemedicine visit and am voluntarily participating in the telemedicine visit.  I understand that I have the right to withhold or withdraw my consent to the use of telemedicine in the course of my care at any time, without affecting my right to future care or treatment, and that the Practitioner or I may terminate the telemedicine visit at any time. I understand that I have the right to inspect all information obtained and/or recorded in the course of the telemedicine visit and may receive copies of available information for a reasonable fee.  I understand that some of the potential risks of receiving the Services via telemedicine include:   Delay or interruption in medical evaluation due to technological equipment failure or disruption;  Information transmitted may not be sufficient (e.g. poor resolution of images) to allow for appropriate medical decision making by the Practitioner; and/or   In rare instances, security protocols could fail, causing a breach of personal health information.  Furthermore, I acknowledge that it is my responsibility to provide information about my medical history, conditions and care that is complete  and accurate to the best of my ability. I acknowledge that Practitioner's advice, recommendations, and/or decision may be based on factors not within their control, such as incomplete or inaccurate data provided by me or distortions of diagnostic images or specimens that may result from electronic transmissions. I understand that the practice of medicine is not an exact science and that Practitioner makes no warranties or guarantees regarding treatment outcomes. I acknowledge that I will receive a copy of this consent concurrently upon execution via email to the email address I last provided but may also request a printed copy by calling the office of [facility].    I understand that my insurance will be billed for this visit.   I have read or had  this consent read to me.  I understand the contents of this consent, which adequately explains the benefits and risks of the Services being provided via telemedicine.   I have been provided ample opportunity to ask questions regarding this consent and the Services and have had my questions answered to my satisfaction.  I give my informed consent for the services to be provided through the use of telemedicine in my medical care  By participating in this telemedicine visit I agree to the above.

## 2018-07-28 NOTE — Progress Notes (Signed)
Virtual Visit via Video Note   This visit type was conducted due to national recommendations for restrictions regarding the COVID-19 Pandemic (e.g. social distancing) in an effort to limit this patient's exposure and mitigate transmission in our community.  Due to his co-morbid illnesses, this patient is at least at moderate risk for complications without adequate follow up.  This format is felt to be most appropriate for this patient at this time.  All issues noted in this document were discussed and addressed.  A limited physical exam was performed with this format.  Please refer to the patient's chart for his consent to telehealth for [facility].   Date:  07/31/2018   ID:  Terry [name], DOB [birth year], MRN [id number]  Patient Location: Home Provider Location: Home  PCP:  Terry Cruel, MD  Cardiologist:  Terry Moores, MD  Electrophysiologist:  Terry Grayer, MD   Evaluation Performed:  Follow-Up Visit  Chief Complaint:  Routine f/u cardiomyopathy, atrial fib  History of Present Illness:    Terry [name] is a 68 y.o. male with persistent atrial fibrillation (dx 06/2015 s/p TEE/DCCV 08/2015), cardiomyopathy (EF 45-50%), HTN, HLD, h/o syncope felt r/t orthostasis, pericarditis 2010, normal cors 2002, central retinal vein occlusion with possible mini-stroke at that time (age 9) s/p laser therapy, CKD stage II who is here to f/u atrial fib.  Atrial fib was diagnosed by PCP 06/2015 at which time the patient had noticed sluggishness but had been going to the gym regularly without acute symptoms. 2D Echo 08/19/15 showed false tendon in LV apex of no clinical significance, EF 50-55%, no RWMA, mild LAE, PASP 95mmHg. He was started on Eliquis and underwent TEE/DCCV 08/2015 which showed large LA with spontaneous contrast swirling, mildly depressed LVEF (number not given). Sleep study 10/2015 was normal. He had recurrence of atrial fib in 05/2017 manifested with concomitant fatigue, poor  appetite and orthopnea. TEE 06/28/17 showed EF 40-45%, mild MV prolapse and he underwent repeat cardioversion. He did not hold sinus so was eventually started on Tikosyn 01/2018. He was seen by the afib clinic 05/2018 and doing well. Repeat echo was planned now that he was in sinus but deferred in Covid pandemic. Last labs 05/2018 Mg 2.1, K 3.8, Cr 1.11 -> 10meq KCl started with recheck BMET planned but not performed, last CBC 06/2017 wnl, 2018 LDL 90.  He reports he is doing very well. He is doing spin class several times a week and going for a daily walk. No CP, SOB, palpitations, edema, weight gain. He is eating more salt through [employer] and pizza while working at home. Has had some trace R dorsal foot edema in a foot that he previously had a significant accident down to the bone. He also has had prior ACL/MCL repair in that leg. He has significant varicose veins as well. The swelling resolves completely by the morning while being elevated. He also reports specific pain in the ball of his foot on that side when he walks. He is unsure of any injuries but this started after he began working from home, sitting more in a specific chair, and doing online spin classes as opposed to the gym. He is practicing social distancing. The patient does not have symptoms concerning for COVID-19 infection (fever, chills, cough, or new shortness of breath).    Past Medical History:  Diagnosis Date  . Aortic root dilation (HCC)    a. mild by CT 10/2017.  . Cardiomyopathy ([place])   . Central retinal vein  occlusion   . Chronic systolic CHF (congestive heart failure) ([place])   . ED (erectile dysfunction)   . H/O cardiac catheterization 2002 - normal cors  . HTN (hypertension)   . Hyperlipidemia   . Hypokalemia   . Leg edema   . LV dysfunction    a. EF appeared mildly depressed by TEE 08/2015, no % given.  . Mini stroke [facility])    a. ? Age 23 at time of central retinal vein occlusion - lost vision in eye  . PAF (paroxysmal  atrial fibrillation) ([place])    a. Dx 06/2015, s/p TEE/DCCV 08/2015.  [name] Pericarditis 2010  . Syncope    Hx of thought to be due to orthostatic hypotension  . Visit for monitoring Tikosyn therapy 01/17/2018   Past Surgical History:  Procedure Laterality Date  . CARDIAC CATHETERIZATION  2002  . CARDIOVERSION N/A 09/08/2015   Procedure: CARDIOVERSION;  Surgeon: Fay Records, MD;  Location: [place];  Service: Cardiovascular;  Laterality: N/A;  . CARDIOVERSION N/A 06/28/2017   Procedure: CARDIOVERSION;  Surgeon: Jerline Pain, MD;  Location: [facility] ENDOSCOPY;  Service: Cardiovascular;  Laterality: N/A;  . HAND SURGERY  2003  . KNEE SURGERY    . NASAL SINUS SURGERY  2003   x2. Scar tissue build up and had to recreate his tear duct  . PERICARDIECTOMY    . TEE WITHOUT CARDIOVERSION N/A 09/08/2015   Procedure: TRANSESOPHAGEAL ECHOCARDIOGRAM (TEE);  Surgeon: Fay Records, MD;  Location[facility] ENDOSCOPY;  Service: Cardiovascular;  Laterality: N/A;  . TEE WITHOUT CARDIOVERSION N/A 06/28/2017   Procedure: TRANSESOPHAGEAL ECHOCARDIOGRAM (TEE);  Surgeon: Jerline Pain, MD;  Location: [facility] ENDOSCOPY;  Service: Cardiovascular;  Laterality: N/A;     Current Meds  Medication Sig  . albuterol (PROVENTIL HFA;VENTOLIN HFA) 108 (90 Base) MCG/ACT inhaler Inhale 2 puffs into the lungs every 4 (four) hours as needed for wheezing or shortness of breath.  . dofetilide (TIKOSYN) 250 MCG capsule Take 1 capsule (250 mcg total) by mouth 2 (two) times daily.  [name] ELIQUIS 5 MG TABS tablet TAKE 1 TABLET BY MOUTH TWICE A DAY  . fluorouracil (EFUDEX) 5 % cream Apply topically 2 (two) times daily.  . fluticasone (FLONASE) 50 MCG/ACT nasal spray Place 2 sprays into both nostrils every evening.   . Nebivolol HCl (BYSTOLIC) 20 MG TABS Take 20 mg by mouth daily.  . potassium chloride (K-DUR) 10 MEQ tablet Take 1 tablet (10 mEq total) by mouth daily.  . Probiotic Product (PROBIOTIC DAILY PO) Take 1 capsule by mouth daily.  .  rosuvastatin (CRESTOR) 5 MG tablet Take 5 mg by mouth daily.  . tadalafil (CIALIS) 20 MG tablet Take 1 tablet (20 mg total) by mouth daily as needed.     Allergies:   Losartan and Penicillins   Social History   Tobacco Use  . Smoking status: Never Smoker  . Smokeless tobacco: Never Used  Substance Use Topics  . Alcohol use: Yes    Comment: 2-3 Times a Week Wine with Meals  . Drug use: Never     Family Hx: The patient's family history includes Atrial fibrillation in his father; Cancer in his mother; Diabetes in his father and mother; Heart disease in his father; Hypertension in his father and mother.  ROS:   Please see the history of present illness.     All other systems reviewed and are negative.   Prior CV studies:   Most recent pertinent cardiac studies are outlined above.  Labs/Other Tests and Data Reviewed:    EKG:  An ECG dated 05/22/18 was personally reviewed today and demonstrated:  SB nonspecific TW changes, QTc 470ms  Recent Labs: 05/22/2018: BUN 16; Creatinine, Ser 1.11; Magnesium 2.1; Potassium 3.8; Sodium 140   Recent Lipid Panel Lab Results  Component Value Date/Time   CHOL 173 04/08/2016 04:40 PM   TRIG 220 (H) 04/08/2016 04:40 PM   HDL 39 (L) 04/08/2016 04:40 PM   CHOLHDL 4.4 04/08/2016 04:40 PM   CHOLHDL 4 02/22/2011 09:14 AM   LDLCALC 90 04/08/2016 04:40 PM    Wt Readings from Last 3 Encounters:  07/31/18 197 lb (89.4 kg)  05/22/18 204 lb (92.5 kg)  02/20/18 203 lb 9.6 oz (92.4 kg)     Objective:    Vital Signs:  BP 107/77   Pulse (!) 56   Ht 6\' 1"  (1.854 m)   Wt 197 lb (89.4 kg)   BMI 25.99 kg/m   General - WM in no acute distress HEENT - NCAT, EOM intact Pulm - No labored breathing, no coughing during visit, no audible wheezing, speaking in full sentences Neuro - A+Ox3, no slurred speech, answers questions appropriately Extremities- trace R foot edema on dorsum of foot Psych - Pleasant affect     ASSESSMENT & PLAN:    1.  Persistent atrial fibrillation - pt follow rhythm with Apple watch and feels he's maintaining NSR. Continue current regimen. He was due for repeat K but has not yet had this done. In setting of Tikosyn use this is very important to recheck to make sure potassium is 4.0 or greater. Since he is also due for maintenance labs (CBC, CMET, lipid profile) will obtain these at same time. 2. Chronic systolic CHF/cardiomyopathy - clinically he sounds euvolemic. He has had isolated R ankle swelling - asymmetric edema is not typical for CHF exacerbation. I wonder if this is related to his prior ortho surgeries and injury on that foot/leg in the setting of working from home and increased sodium intake. It seems unusual though that he would have pain specifically in the ball of his foot - ? Orthopedic stress fracture contributing. I have asked him to reach out to PCP. He has been faithful with his Eliquis so DVT felt much less likely. No evidence of foot hematoma on exam. We could consider trial of Lasix but with his borderline potassium level recently (without subsequent recheck) I would be hesitant to do so until we have f/u labs. I have advised compression, elevation, movement, and decreasing sodium intake. 3. Mitral valve prolapse - f/u with echo later this year. 4. Hyperlipidemia - obtain lipid profile when he returns for potassium check.  COVID-19 Education: The signs and symptoms of COVID-19 were discussed with the patient and how to seek care for testing (follow up with PCP or arrange E-visit).  The importance of social distancing was discussed today. He inquired about whether he should continue to work from home if given the option. I think this is ideal given his cardiac history and we can provide a note if his employer asks.  Time:   Today, I have spent 23 minutes with the patient with telehealth technology discussing the above problems.     Medication Adjustments/Labs and Tests Ordered: Current medicines  are reviewed at length with the patient today.  Concerns regarding medicines are outlined above.    Disposition:  Follow up 6 months with Dr. Acie Fredrickson. Also has f/u with afib clinic 08/2018.  Signed, Nedra Hai  Jaanai Salemi, PA-C  07/31/2018 9:57 AM    [facility]

## 2018-07-30 ENCOUNTER — Other Ambulatory Visit: Payer: Self-pay | Admitting: Cardiovascular Disease

## 2018-07-30 DIAGNOSIS — I10 Essential (primary) hypertension: Secondary | ICD-10-CM

## 2018-07-30 DIAGNOSIS — E785 Hyperlipidemia, unspecified: Secondary | ICD-10-CM

## 2018-07-30 DIAGNOSIS — I480 Paroxysmal atrial fibrillation: Secondary | ICD-10-CM

## 2018-07-31 ENCOUNTER — Telehealth (INDEPENDENT_AMBULATORY_CARE_PROVIDER_SITE_OTHER): Payer: 59 | Admitting: Physician Assistant

## 2018-07-31 ENCOUNTER — Encounter: Payer: Self-pay | Admitting: Physician Assistant

## 2018-07-31 ENCOUNTER — Other Ambulatory Visit: Payer: Self-pay

## 2018-07-31 VITALS — BP 107/77 | HR 56 | Ht 73.0 in | Wt 197.0 lb

## 2018-07-31 DIAGNOSIS — E785 Hyperlipidemia, unspecified: Secondary | ICD-10-CM

## 2018-07-31 DIAGNOSIS — I341 Nonrheumatic mitral (valve) prolapse: Secondary | ICD-10-CM

## 2018-07-31 DIAGNOSIS — I4819 Other persistent atrial fibrillation: Secondary | ICD-10-CM

## 2018-07-31 DIAGNOSIS — I429 Cardiomyopathy, unspecified: Secondary | ICD-10-CM

## 2018-07-31 DIAGNOSIS — I5022 Chronic systolic (congestive) heart failure: Secondary | ICD-10-CM

## 2018-07-31 MED ORDER — NEBIVOLOL HCL 20 MG PO TABS: 20.0000 mg | ORAL_TABLET | Freq: Every day | ORAL | 3 refills | Status: DC

## 2018-07-31 NOTE — Patient Instructions (Signed)
Medication Instructions:  Your physician recommends that you continue on your current medications as directed. Please refer to the Current Medication list given to you today.  If you need a refill on your cardiac medications before your next appointment, please call your pharmacy.   Lab work: 08/04/18: CBC, CMET, MAG, & LIPID  If you have labs (blood work) drawn today and your tests are completely normal, you will receive your results only by: [name] MyChart Message (if you have MyChart) OR . A paper copy in the mail If you have any lab test that is abnormal or we need to change your treatment, we will call you to review the results.  Testing/Procedures: None ordered   Follow-Up: At [facility], you and your health needs are our priority.  As part of our continuing mission to provide you with exceptional heart care, we have created designated Provider Care Teams.  These Care Teams include your primary Cardiologist (physician) and Advanced Practice Providers (APPs -  Physician Assistants and Nurse Practitioners) who all work together to provide you with the care you need, when you need it. You will need a follow up appointment in:  6 months.  Please call our office 2 months in advance to schedule this appointment.  You may see Mertie Moores, MD or one of the following Advanced Practice Providers on your designated Care Team: [name], PA-C [place], [place] . Daune Perch, NP  Any Other Special Instructions Will Be Listed Below (If Applicable).

## 2018-07-31 NOTE — Telephone Encounter (Signed)
Pt is a 68 yr old male who saw Afib clinic on 05/22/18. His last noted weight was 92.5Kg at that visit. SCr on 05/22/18 was 1.11. Will refill Eliquis 5mg  BID.

## 2018-08-04 ENCOUNTER — Other Ambulatory Visit: Payer: 59 | Admitting: *Deleted

## 2018-08-04 ENCOUNTER — Other Ambulatory Visit: Payer: Self-pay

## 2018-08-04 DIAGNOSIS — I5022 Chronic systolic (congestive) heart failure: Secondary | ICD-10-CM

## 2018-08-04 DIAGNOSIS — I341 Nonrheumatic mitral (valve) prolapse: Secondary | ICD-10-CM

## 2018-08-04 DIAGNOSIS — I429 Cardiomyopathy, unspecified: Secondary | ICD-10-CM

## 2018-08-04 DIAGNOSIS — I4819 Other persistent atrial fibrillation: Secondary | ICD-10-CM

## 2018-08-04 DIAGNOSIS — E785 Hyperlipidemia, unspecified: Secondary | ICD-10-CM

## 2018-08-04 LAB — COMPREHENSIVE METABOLIC PANEL
ALT: 25 IU/L (ref 0–44)
AST: 21 IU/L (ref 0–40)
Albumin/Globulin Ratio: 2.1 (ref 1.2–2.2)
Albumin: 4.1 g/dL (ref 3.8–4.8)
Alkaline Phosphatase: 53 IU/L (ref 39–117)
BUN/Creatinine Ratio: 10 (ref 10–24)
BUN: 12 mg/dL (ref 8–27)
Bilirubin Total: 0.8 mg/dL (ref 0.0–1.2)
CO2: 24 mmol/L (ref 20–29)
Calcium: 9.4 mg/dL (ref 8.6–10.2)
Chloride: 103 mmol/L (ref 96–106)
Creatinine, Ser: 1.2 mg/dL (ref 0.76–1.27)
GFR calc Af Amer: 73 mL/min/{1.73_m2} (ref >59)
GFR calc non Af Amer: 63 mL/min/{1.73_m2} (ref >59)
Globulin, Total: 2 g/dL (ref 1.5–4.5)
Glucose: 92 mg/dL (ref 65–99)
Potassium: 4.2 mmol/L (ref 3.5–5.2)
Sodium: 143 mmol/L (ref 134–144)
Total Protein: 6.1 g/dL (ref 6.0–8.5)

## 2018-08-04 LAB — LIPID PANEL
Chol/HDL Ratio: 3.7 ratio (ref 0.0–5.0)
Cholesterol, Total: 157 mg/dL (ref 100–199)
HDL: 42 mg/dL (ref >39)
LDL Calculated: 94 mg/dL (ref 0–99)
Triglycerides: 107 mg/dL (ref 0–149)
VLDL Cholesterol Cal: 21 mg/dL (ref 5–40)

## 2018-08-04 LAB — CBC
Hematocrit: 46.8 % (ref 37.5–51.0)
Hemoglobin: 15.9 g/dL (ref 13.0–17.7)
MCH: 31.4 pg (ref 26.6–33.0)
MCHC: 34 g/dL (ref 31.5–35.7)
MCV: 92 fL (ref 79–97)
Platelets: 185 10*3/uL (ref 150–450)
RBC: 5.07 x10E6/uL (ref 4.14–5.80)
RDW: 13.4 % (ref 11.6–15.4)
WBC: 4.7 10*3/uL (ref 3.4–10.8)

## 2018-08-04 LAB — MAGNESIUM: Magnesium: 2.1 mg/dL (ref 1.6–2.3)

## 2018-08-23 ENCOUNTER — Ambulatory Visit (HOSPITAL_COMMUNITY): Payer: 59 | Admitting: Physician Assistant

## 2018-09-27 ENCOUNTER — Ambulatory Visit (HOSPITAL_COMMUNITY)
Admission: RE | Admit: 2018-09-27 | Discharge: 2018-09-27 | Disposition: A | Discharge status: Home / Self Care | Payer: 59 | Source: Ambulatory Visit | Attending: Physician Assistant | Admitting: Physician Assistant

## 2018-09-27 ENCOUNTER — Encounter (HOSPITAL_COMMUNITY): Payer: Self-pay | Admitting: Physician Assistant

## 2018-09-27 ENCOUNTER — Other Ambulatory Visit: Payer: Self-pay

## 2018-09-27 VITALS — BP 110/74 | HR 61 | Ht 73.0 in | Wt 200.0 lb

## 2018-09-27 DIAGNOSIS — I5022 Chronic systolic (congestive) heart failure: Secondary | ICD-10-CM | POA: Diagnosis not present

## 2018-09-27 DIAGNOSIS — I4819 Other persistent atrial fibrillation: Secondary | ICD-10-CM | POA: Insufficient documentation

## 2018-09-27 DIAGNOSIS — Z8249 Family history of ischemic heart disease and other diseases of the circulatory system: Secondary | ICD-10-CM | POA: Diagnosis not present

## 2018-09-27 DIAGNOSIS — I110 Hypertensive heart disease with heart failure: Secondary | ICD-10-CM | POA: Insufficient documentation

## 2018-09-27 DIAGNOSIS — E876 Hypokalemia: Secondary | ICD-10-CM | POA: Insufficient documentation

## 2018-09-27 DIAGNOSIS — E785 Hyperlipidemia, unspecified: Secondary | ICD-10-CM | POA: Insufficient documentation

## 2018-09-27 DIAGNOSIS — Z79899 Other long term (current) drug therapy: Secondary | ICD-10-CM | POA: Diagnosis not present

## 2018-09-27 DIAGNOSIS — Z7901 Long term (current) use of anticoagulants: Secondary | ICD-10-CM | POA: Diagnosis not present

## 2018-09-27 DIAGNOSIS — I429 Cardiomyopathy, unspecified: Secondary | ICD-10-CM | POA: Insufficient documentation

## 2018-09-27 LAB — BASIC METABOLIC PANEL
Anion gap: 8 (ref 5–15)
BUN: 19 mg/dL (ref 8–23)
CO2: 24 mmol/L (ref 22–32)
Calcium: 9 mg/dL (ref 8.9–10.3)
Chloride: 108 mmol/L (ref 98–111)
Creatinine, Ser: 1.01 mg/dL (ref 0.61–1.24)
GFR calc Af Amer: >60 mL/min (ref >60)
GFR calc non Af Amer: >60 mL/min (ref >60)
Glucose, Bld: 78 mg/dL (ref 70–99)
Potassium: 3.9 mmol/L (ref 3.5–5.1)
Sodium: 140 mmol/L (ref 135–145)

## 2018-09-27 LAB — MAGNESIUM: Magnesium: 2.1 mg/dL (ref 1.7–2.4)

## 2018-09-27 NOTE — Progress Notes (Signed)
Primary Care Physician: Lawerance Cruel, MD Referring Physician: Sharrell Ku, PA Primary EP: Dr Rayann Heman Primary Cardiologist: Dr Angelita Ingles Lagrand is a 68 y.o. Terry [name] with a h/o atrial fibrillation, first occurrence in 2017, with return to [place] with cardioversion, with return to afib in February with URI. He saw Sharrell Ku at that time. BNP at  was 1200, echo showed EF at 45 % . Pt cut out all salt,lost 5 lbs since that visit. He reports that he is minimally symptomatic with afib, he is rate controlled, chronically on Bystolic. Avid exerciser, usually working out 5x a week. He had stopped his exercise routine since the URI with  return of afib.  He also took decongestants during that time. S/p dofetilide loading 01/2018.   Patient reports that he has done very well since his last visit with no heart racing or palpitations. He remains very active. He denies any bleeding issues on anticoagulation.   Today, he denies symptoms of palpitations, chest pain, shortness of breath, orthopnea, PND, lower extremity edema, dizziness, presyncope, syncope, or neurologic sequela. The patient is tolerating medications without difficulties and is otherwise without complaint today.   Past Medical History:  Diagnosis Date  . Aortic root dilation (HCC)    a. mild by CT 10/2017.  . Cardiomyopathy ([place])   . Central retinal vein occlusion   . Chronic systolic CHF (congestive heart failure) ([place])   . ED (erectile dysfunction)   . H/O cardiac catheterization 2002 - normal cors  . HTN (hypertension)   . Hyperlipidemia   . Hypokalemia   . Leg edema   . LV dysfunction    a. EF appeared mildly depressed by TEE 08/2015, no % given.  . Mini stroke [facility])    a. ? Age 3 at time of central retinal vein occlusion - lost vision in eye  . PAF (paroxysmal atrial fibrillation) ([place])    a. Dx 06/2015, s/p TEE/DCCV 08/2015.  [name] Pericarditis 2010  . Syncope    Hx of thought to be due to orthostatic hypotension  . Visit for  monitoring Tikosyn therapy 01/17/2018   Past Surgical History:  Procedure Laterality Date  . CARDIAC CATHETERIZATION  2002  . CARDIOVERSION N/A 09/08/2015   Procedure: CARDIOVERSION;  Surgeon: Fay Records, MD;  Location: [place];  Service: Cardiovascular;  Laterality: N/A;  . CARDIOVERSION N/A 06/28/2017   Procedure: CARDIOVERSION;  Surgeon: Jerline Pain, MD;  Location: [facility] ENDOSCOPY;  Service: Cardiovascular;  Laterality: N/A;  . HAND SURGERY  2003  . KNEE SURGERY    . NASAL SINUS SURGERY  2003   x2. Scar tissue build up and had to recreate his tear duct  . PERICARDIECTOMY    . TEE WITHOUT CARDIOVERSION N/A 09/08/2015   Procedure: TRANSESOPHAGEAL ECHOCARDIOGRAM (TEE);  Surgeon: Fay Records, MD;  Location: [facility] ENDOSCOPY;  Service: Cardiovascular;  Laterality: N/A;  . TEE WITHOUT CARDIOVERSION N/A 06/28/2017   Procedure: TRANSESOPHAGEAL ECHOCARDIOGRAM (TEE);  Surgeon: Jerline Pain, MD;  Location: [facility] ENDOSCOPY;  Service: Cardiovascular;  Laterality: N/A;    Current Outpatient Medications  Medication Sig Dispense Refill  . albuterol (PROVENTIL HFA;VENTOLIN HFA) 108 (90 Base) MCG/ACT inhaler Inhale 2 puffs into the lungs every 4 (four) hours as needed for wheezing or shortness of breath.    . dofetilide (TIKOSYN) 250 MCG capsule Take 1 capsule (250 mcg total) by mouth 2 (two) times daily. 180 capsule 3  . ELIQUIS 5 MG TABS tablet TAKE 1 TABLET BY MOUTH  TWICE A DAY 180 tablet 3  . fluorouracil (EFUDEX) 5 % cream Apply topically 2 (two) times daily.    . fluticasone (FLONASE) 50 MCG/ACT nasal spray Place 2 sprays into both nostrils every evening.     . Nebivolol HCl (BYSTOLIC) 20 MG TABS Take 1 tablet (20 mg total) by mouth daily. 90 tablet 3  . potassium chloride (K-DUR) 10 MEQ tablet Take 1 tablet (10 mEq total) by mouth daily. 90 tablet 3  . Probiotic Product (PROBIOTIC DAILY PO) Take 1 capsule by mouth daily.    . rosuvastatin (CRESTOR) 5 MG tablet Take 5 mg by mouth daily.    .  tadalafil (CIALIS) 20 MG tablet Take 1 tablet (20 mg total) by mouth daily as needed. 15 tablet 5   No current facility-administered medications for this encounter.     Allergies  Allergen Reactions  . Losartan Other (See Comments)    Makes BP drop very low  . Penicillins Other (See Comments)    Childhood reaction Has patient had a PCN reaction causing immediate rash, facial/tongue/throat swelling, SOB or lightheadedness with hypotension: yes Has patient had a PCN reaction causing severe rash involving mucus membranes or skin necrosis: no Has patient had a PCN reaction that required hospitalization: no Has patient had a PCN reaction occurring within the last 10 years: no If all of the above answers are "NO", then may proceed with Cephalosporin use.     Social History   Socioeconomic History  . Marital status: Married    Spouse name: Not on file  . Number of children: Not on file  . Years of education: Not on file  . Highest education level: Not on file  Occupational History  . Not on file  Social Needs  . Financial resource strain: Not on file  . Food insecurity    Worry: Not on file    Inability: Not on file  . Transportation needs    Medical: Not on file    Non-medical: Not on file  Tobacco Use  . Smoking status: Never Smoker  . Smokeless tobacco: Never Used  Substance and Sexual Activity  . Alcohol use: Yes    Comment: 2-3 Times a Week Wine with Meals  . Drug use: Never  . Sexual activity: Not on file  Lifestyle  . Physical activity    Days per week: Not on file    Minutes per session: Not on file  . Stress: Not on file  Relationships  . Social Herbalist on phone: Not on file    Gets together: Not on file    Attends religious service: Not on file    Active member of club or organization: Not on file    Attends meetings of clubs or organizations: Not on file    Relationship status: Not on file  . Intimate partner violence    Fear of current or  ex partner: Not on file    Emotionally abused: Not on file    Physically abused: Not on file    Forced sexual activity: Not on file  Other Topics Concern  . Not on file  Social History Narrative  . Not on file    Family History  Problem Relation Age of Onset  . Hypertension Mother   . Diabetes Mother   . Cancer Mother   . Atrial fibrillation Father   . Heart disease Father        had pacemaker  . Hypertension Father   .  Diabetes Father     ROS- All systems are reviewed and negative except as per the HPI above  Physical Exam: Vitals:   09/27/18 1443  BP: 110/74  Pulse: 61  Weight: 90.7 kg  Height: 6\' 1"  (1.854 m)   Wt Readings from Last 3 Encounters:  09/27/18 90.7 kg  07/31/18 89.4 kg  05/22/18 92.5 kg    Labs: Lab Results  Component Value Date   NA 143 08/04/2018   K 4.2 08/04/2018   CL 103 08/04/2018   CO2 24 08/04/2018   GLUCOSE 92 08/04/2018   BUN 12 08/04/2018   CREATININE 1.20 08/04/2018   CALCIUM 9.4 08/04/2018   MG 2.1 08/04/2018   Lab Results  Component Value Date   INR 1.0 04/25/2008   Lab Results  Component Value Date   CHOL 157 08/04/2018   HDL 42 08/04/2018   LDLCALC 94 08/04/2018   TRIG Terry 08/04/2018     GEN- The patient is well appearing, alert and oriented x 3 today.   HEENT-head normocephalic, atraumatic, sclera clear, conjunctiva pink, hearing intact, trachea midline. Lungs- Clear to ausculation bilaterally, normal work of breathing Heart- Regular rate and rhythm, no murmurs, rubs or gallops  GI- soft, NT, ND, + BS Extremities- no clubbing, cyanosis, or edema MS- no significant deformity or atrophy Skin- no rash or lesion Psych- euthymic mood, full affect Neuro- strength and sensation are intact   EKG- SR HR 61, 1st degree AV block, PR 202, QRS 78, QTc 457  Echo-Study Conclusions  - Left ventricle: The cavity size was normal. Wall thickness was   normal. Systolic function was mildly reduced. The estimated   ejection  fraction was in the range of 45% to 50%. Mild diffuse   hypokinesis with no identifiable regional variations. - Ventricular septum: Septal motion showed paradox. - Left atrium: The atrium was mildly dilated. - Right atrium: The atrium was mildly dilated. TEE- 06/2017-Study Conclusions  - Left ventricle: Systolic function was mildly to moderately   reduced. The estimated ejection fraction was in the range of 40%   to 45%. No evidence of thrombus. - Aortic valve: No evidence of vegetation. - Mitral valve: Mild prolapse, involving the posterior leaflet. No   evidence of vegetation. - Left atrium: No evidence of thrombus in the atrial cavity or   appendage. No evidence of thrombus in the atrial cavity or   appendage. - Right atrium: No evidence of thrombus in the atrial cavity or   appendage. - Atrial septum: No defect or patent foramen ovale was identified. - Tricuspid valve: No evidence of vegetation. There was moderate   regurgitation. - Pulmonic valve: No evidence of vegetation.  Impressions:  - Successful cardioversion. No cardiac source of emboli was   indentified.   Assessment and Plan: 1. Persistent atrial fibrillation S/p dofetilide loading 01/2018. Patient appears to be maintaining SR. Continue dofetilide 250 mcg BID, QT stable.  Continue Bystolic 20 mg daily Continues eliquis 5 mg bid Bmet/mag today  This patients CHA2DS2-VASc Score and unadjusted Ischemic Stroke Rate (% per year) is equal to 4.8 % stroke rate/year from a score of 4  Above score calculated as 1 point each if present [CHF, HTN, DM, Vascular=MI/PAD/Aortic Plaque, Age if 65-74, or Terry [name]] Above score calculated as 2 points each if present [Age > 75, or Stroke/TIA/TE]   2. HTN Stable, no changes today.   3. Chronic systolic CHF EF 03-55% on TEE 06/28/17 No signs or symptoms of fluid overload. Recheck echo now  that he is back in SR.   F/u with Dr Acie Fredrickson as scheduled. AF clinic 6-7 months.    [facility] [address] [place], [place] [address] [phone]

## 2018-10-25 ENCOUNTER — Other Ambulatory Visit: Payer: Self-pay

## 2018-10-25 ENCOUNTER — Ambulatory Visit (HOSPITAL_COMMUNITY)
Admission: RE | Admit: 2018-10-25 | Discharge: 2018-10-25 | Disposition: A | Discharge status: Home / Self Care | Payer: 59 | Source: Ambulatory Visit | Attending: Nurse Practitioner | Admitting: Nurse Practitioner

## 2018-10-25 DIAGNOSIS — Z8673 Personal history of transient ischemic attack (TIA), and cerebral infarction without residual deficits: Secondary | ICD-10-CM | POA: Insufficient documentation

## 2018-10-25 DIAGNOSIS — I429 Cardiomyopathy, unspecified: Secondary | ICD-10-CM | POA: Insufficient documentation

## 2018-10-25 DIAGNOSIS — I10 Essential (primary) hypertension: Secondary | ICD-10-CM | POA: Diagnosis not present

## 2018-10-25 DIAGNOSIS — I77810 Thoracic aortic ectasia: Secondary | ICD-10-CM | POA: Diagnosis not present

## 2018-10-25 DIAGNOSIS — I4819 Other persistent atrial fibrillation: Secondary | ICD-10-CM | POA: Diagnosis not present

## 2018-10-25 DIAGNOSIS — I341 Nonrheumatic mitral (valve) prolapse: Secondary | ICD-10-CM | POA: Insufficient documentation

## 2018-10-25 DIAGNOSIS — E785 Hyperlipidemia, unspecified: Secondary | ICD-10-CM | POA: Diagnosis not present

## 2018-10-25 NOTE — Progress Notes (Signed)
  Echocardiogram 2D Echocardiogram has been performed.  Terry [name] 10/25/2018, 3:07 PM

## 2018-11-02 ENCOUNTER — Encounter (HOSPITAL_COMMUNITY): Payer: Self-pay | Admitting: *Deleted

## 2019-01-03 ENCOUNTER — Telehealth: Payer: Self-pay | Admitting: Cardiovascular Disease

## 2019-01-03 NOTE — Telephone Encounter (Signed)
Patient wanted to know if there was anything he needed to do before his upcoming appt on 12-18 (labs, tests, etc).

## 2019-01-04 NOTE — Telephone Encounter (Signed)
Spoke with patient and advised that we would like to get fasting labs either prior to or on the day of his appointment. He states he will come to his appointment on 12/18 having fasted for 6 hours. He will need Mag, CBC, Lipid, liver panel, BMET. Patient verbalized understanding and agreement with plan and thanked me for the call.

## 2019-03-01 NOTE — Progress Notes (Signed)
Terry [name] Date of Birth  [birth year] [facility] [address]    [place], [place]  [address] [phone]   Problem List 1. Hx of chest pain: Normal cath in 2002 Terry [name])  A single plane ventriculogram revealed normal wall motion, ejection fraction  of approximately 65%. There was no gradient noted on pullback.  Fluoroscopy revealed no significant calcification.  Coronary angiography:  1. The left main coronary artery bifurcated into the left anterior descending  and circumflex vessel. There was no significant disease in the left main  coronary artery.  2. Left anterior descending: The left anterior descending artery gave rise to  a moderate sized D-1, a small D-2, and went on to end as an apical  recurrent branch. There was no significant disease in the left anterior  descending or its branches.  3. Circumflex vessel: The circumflex vessel was a large caliber vessel that  gave rise to a large branching obtuse marginal, then went on to end as an  AV groove vessel.  4. Right coronary artery: The right coronary artery was dominant for the  posterior circulation. The right coronary artery gave rise to two small  ______ marginals, a moderate sized PDA, and two large PL branches. There  was no significant disease in the right coronary artery or its branches.  2. Paroxysmal atrial fib 3. Essential HTN   Terry [name] is a 68 year old gentleman with a history of hypertension and hyperlipidemia. He's done very well since I last saw him last year. He still cycling on a regular basis. He denies any episodes of syncope or presyncope.  Oct. 14, 2014:  Terry [name] was seen in the ER several weeks ago for chest pain.   On Sept. 24 - he awoke with CP.  He reports having some leg cramping earlier that night.  He has been lifting weights and does not think it was MSK.   The pain was mid sternal, radiated outward,  Pulsating like CP.  The pain lasted for several hours and  he went to the ED.  The pain resolved with Toradol.   He had some residual CP for then next several days.  He took motrin - the discomfort was gone after several days.    He has esopageal pain with  Eating dry rice, dry chicken.   Oct. 25, 2017:  Terry [name] is doing well BP is elevated.  Saw [name] several months ago.  Irbesartan was doubled.  His sleep study was negative.  Is not taking the Kdur - thinks it may be causig him gas  Had a TEE / Cardioversion in September 08, 2015:    BP is elevated .   Does not eat any salty foods.  Has slacked off on his cycling.   [facility]   Has a strong family hx of  HTN   Jan. 25, 2018:  Feeling well.   BP is much better on the Aldactone  Still at the [facility] regularly  Stopped taking the Kdur due to stomach ache.   January 05, 2018: Terry [name] is seen back today for follow-up visit.  He is  He has been seen on numerous occasions by Sharrell Ku and more recently by Roderic Palau for persistent atrial fibrillation.  He feels much better in NSR compared to Afib  Has had 2 cardioversions The last cardioversion did not last very long  He saw Roderic Palau - she suggested [facility]   March 02, 2019:  Terry [name] is seen today for follow-up of his persistent Atrial fibrillation.  He has been seen in the atrial fibrillation clinic several times. Currently working from home.  ( works for [employer])  Is now on [place],  Has maintained NSR  Still exerercising .at home   Has developed a cough . Wondered if it was cardiac related.  His wife and daughter were very concerned that this might be a cardiac issue. I suggested that he start taking Pepcid Complete.  We will also try albuterol HFA as needed.     Current Outpatient Medications on File Prior to Visit  Medication Sig Dispense Refill  . albuterol (PROVENTIL HFA;VENTOLIN HFA) 108 (90 Base) MCG/ACT inhaler Inhale 2 puffs into the lungs every 4 (four) hours as needed for  wheezing or shortness of breath.    . dofetilide (TIKOSYN) 250 MCG capsule Take 1 capsule (250 mcg total) by mouth 2 (two) times daily. 180 capsule 3  . ELIQUIS 5 MG TABS tablet TAKE 1 TABLET BY MOUTH TWICE A DAY 180 tablet 3  . fluorouracil (EFUDEX) 5 % cream Apply topically 2 (two) times daily.    . fluticasone (FLONASE) 50 MCG/ACT nasal spray Place 2 sprays into both nostrils every evening.     . Nebivolol HCl (BYSTOLIC) 20 MG TABS Take 1 tablet (20 mg total) by mouth daily. 90 tablet 3  . potassium chloride (K-DUR) 10 MEQ tablet Take 1 tablet (10 mEq total) by mouth daily. 90 tablet 3  . Probiotic Product (PROBIOTIC DAILY PO) Take 1 capsule by mouth daily.    . rosuvastatin (CRESTOR) 5 MG tablet Take 5 mg by mouth daily.    . tadalafil (CIALIS) 20 MG tablet Take 1 tablet (20 mg total) by mouth daily as needed. 15 tablet 5   No current facility-administered medications on file prior to visit.    Allergies  Allergen Reactions  . Losartan Other (See Comments)    Makes BP drop very low  . Penicillins Other (See Comments)    Childhood reaction Has patient had a PCN reaction causing immediate rash, facial/tongue/throat swelling, SOB or lightheadedness with hypotension: yes Has patient had a PCN reaction causing severe rash involving mucus membranes or skin necrosis: no Has patient had a PCN reaction that required hospitalization: no Has patient had a PCN reaction occurring within the last 10 years: no If all of the above answers are "NO", then may proceed with Cephalosporin use.     Past Medical History:  Diagnosis Date  . Aortic root dilation (HCC)    a. mild by CT 10/2017.  . Cardiomyopathy ([place])   . Central retinal vein occlusion   . Chronic systolic CHF (congestive heart failure) ([place])   . ED (erectile dysfunction)   . H/O cardiac catheterization 2002 - normal cors  . HTN (hypertension)   . Hyperlipidemia   . Hypokalemia   . Leg edema   . LV dysfunction    a. EF appeared  mildly depressed by TEE 08/2015, no % given.  . Mini stroke [facility])    a. ? Age 17 at time of central retinal vein occlusion - lost vision in eye  . PAF (paroxysmal atrial fibrillation) ([place])    a. Dx 06/2015, s/p TEE/DCCV 08/2015.  [name] Pericarditis 2010  . Syncope    Hx of thought to be due to orthostatic hypotension  . Visit for monitoring Tikosyn therapy 01/17/2018    Past Surgical History:  Procedure Laterality Date  . CARDIAC  CATHETERIZATION  2002  . CARDIOVERSION N/A 09/08/2015   Procedure: CARDIOVERSION;  Surgeon: Fay Records, MD;  Location: [facility];  Service: Cardiovascular;  Laterality: N/A;  . CARDIOVERSION N/A 06/28/2017   Procedure: CARDIOVERSION;  Surgeon: Jerline Pain, MD;  Location: [facility] ENDOSCOPY;  Service: Cardiovascular;  Laterality: N/A;  . HAND SURGERY  2003  . KNEE SURGERY    . NASAL SINUS SURGERY  2003   x2. Scar tissue build up and had to recreate his tear duct  . PERICARDIECTOMY    . TEE WITHOUT CARDIOVERSION N/A 09/08/2015   Procedure: TRANSESOPHAGEAL ECHOCARDIOGRAM (TEE);  Surgeon: Fay Records, MD;  Location: [facility] ENDOSCOPY;  Service: Cardiovascular;  Laterality: N/A;  . TEE WITHOUT CARDIOVERSION N/A 06/28/2017   Procedure: TRANSESOPHAGEAL ECHOCARDIOGRAM (TEE);  Surgeon: Jerline Pain, MD;  Location: [facility] ENDOSCOPY;  Service: Cardiovascular;  Laterality: N/A;    Social History   Tobacco Use  Smoking Status Never Smoker  Smokeless Tobacco Never Used    Social History   Substance and Sexual Activity  Alcohol Use Yes   Comment: 2-3 Times a Week Wine with Meals    Family History  Problem Relation Age of Onset  . Hypertension Mother   . Diabetes Mother   . Cancer Mother   . Atrial fibrillation Father   . Heart disease Father        had pacemaker  . Hypertension Father   . Diabetes Father     Reviw of Systems:  Reviewed in the HPI.  All other systems are negative.  Physical Exam: There were no vitals taken for this visit.  GEN:  Well  nourished, well developed in no acute distress HEENT: Normal NECK: No JVD; No carotid bruits LYMPHATICS: No lymphadenopathy CARDIAC: RRR  RESPIRATORY:  Clear to auscultation without rales, wheezing or rhonchi  ABDOMEN: Soft, non-tender, non-distended MUSCULOSKELETAL:  No edema; No deformity  SKIN: Warm and dry NEUROLOGIC:  Alert and oriented x 3    ECG:   Assessment / Plan:   1. Paroxysmal atrial fibrillation:   Long discussion with the patient and his wife.  I support Kerby Less decision to try dofetilide. I think he  feels better in normal sinus rhythm. Continue  Eliquis.  He will follow-up with Roderic Palau.  He has had FMLA forms filled out but his employer did not accept him.  We will fill them out again.  2. Essential hypertension:  Blood pressure is actually on the low side.  Continue Bystolic.  We may need to decrease the dose after his cardioversion.  3.  Chronic systolic congestive heart failure: His ejection fraction is 45 to 50%.  This may improve if we keep him in normal sinus rhythm.  He is had a heart catheterization years ago which revealed normal coronary arteries.  Melina Copa, PA will  see him in 6 months.    Mertie Moores, MD  03/01/2019 8:36 PM    [place],  [place], [place]  [address] Pager [phone] Phone: [phone]; Fax: [phone]

## 2019-03-02 ENCOUNTER — Encounter: Payer: Self-pay | Admitting: Cardiovascular Disease

## 2019-03-02 ENCOUNTER — Ambulatory Visit (INDEPENDENT_AMBULATORY_CARE_PROVIDER_SITE_OTHER): Payer: Managed Care, Other (non HMO) | Admitting: Cardiovascular Disease

## 2019-03-02 ENCOUNTER — Other Ambulatory Visit: Payer: Self-pay

## 2019-03-02 VITALS — BP 120/80 | HR 58 | Ht 73.0 in | Wt 202.0 lb

## 2019-03-02 DIAGNOSIS — I4819 Other persistent atrial fibrillation: Secondary | ICD-10-CM | POA: Diagnosis not present

## 2019-03-02 DIAGNOSIS — E785 Hyperlipidemia, unspecified: Secondary | ICD-10-CM

## 2019-03-02 MED ORDER — ALBUTEROL SULFATE HFA 108 (90 BASE) MCG/ACT IN AERS: 2.0000 | INHALATION_SPRAY | RESPIRATORY_TRACT | 2 refills | Status: DC | PRN

## 2019-03-02 NOTE — Patient Instructions (Signed)
Medication Instructions:  Your physician recommends that you continue on your current medications as directed. Please refer to the Current Medication list given to you today. Take Famotidine (Pepcid) 20-40 mg for your acid reflux - it is over the counter  *If you need a refill on your cardiac medications before your next appointment, please call your pharmacy*  Lab Work: None Ordered   Testing/Procedures: None Ordered   Follow-Up: Keep your appointment with [facility] on January 18  At [facility], you and your health needs are our priority.  As part of our continuing mission to provide you with exceptional heart care, we have created designated Provider Care Teams.  These Care Teams include your primary Cardiologist (physician) and Advanced Practice Providers (APPs -  Physician Assistants and Nurse Practitioners) who all work together to provide you with the care you need, when you need it.  Your next appointment:   6 month(s)  The format for your next appointment:   In Person  Provider:   Melina Copa, PA

## 2019-03-21 ENCOUNTER — Telehealth: Payer: Self-pay

## 2019-03-21 NOTE — Telephone Encounter (Signed)
**Note De-Identified Ailea Rhatigan Obfuscation** Fax received from [employer] stating that a Eliquis PA in needed with a message "Action Required by 03/22/2019" I attempted to do the PA through covermymeds Key: [id number] and received the following message: "Patient inactive"  I then called [employer] at [phone] and attempted to do a Eliquis PA over the phone with [place] [place]. Per Evalee Mutton the pt is inactive as his coverage with them ended on 03/15/2019.  I called the pt and made him aware. He states that he did renew his policy and that he received a letter from [facility] stating that they were switching to [facility] and will no longer use [employer] but he didn't understand what that meant.  He reports that he has 2 months of Eliquis on hand.  He states that he Is calling [facility] today to get a better understanding of what the switch in pharmacies means to him and if there is anything he needs to do.  He is advised to call back if there is anything I can help him with.  He thanked me for calling him with this information.

## 2019-04-02 ENCOUNTER — Encounter (HOSPITAL_COMMUNITY): Payer: Self-pay | Admitting: Physician Assistant

## 2019-04-02 ENCOUNTER — Other Ambulatory Visit: Payer: Self-pay

## 2019-04-02 ENCOUNTER — Ambulatory Visit (HOSPITAL_COMMUNITY)
Admission: RE | Admit: 2019-04-02 | Discharge: 2019-04-02 | Disposition: A | Discharge status: Home / Self Care | Payer: 59 | Source: Ambulatory Visit | Attending: Physician Assistant | Admitting: Physician Assistant

## 2019-04-02 VITALS — BP 128/90 | HR 53 | Ht 73.0 in | Wt 204.8 lb

## 2019-04-02 DIAGNOSIS — E876 Hypokalemia: Secondary | ICD-10-CM | POA: Diagnosis not present

## 2019-04-02 DIAGNOSIS — Z888 Allergy status to other drugs, medicaments and biological substances status: Secondary | ICD-10-CM | POA: Diagnosis not present

## 2019-04-02 DIAGNOSIS — D6869 Other thrombophilia: Secondary | ICD-10-CM

## 2019-04-02 DIAGNOSIS — Z7901 Long term (current) use of anticoagulants: Secondary | ICD-10-CM | POA: Diagnosis not present

## 2019-04-02 DIAGNOSIS — I5022 Chronic systolic (congestive) heart failure: Secondary | ICD-10-CM | POA: Diagnosis not present

## 2019-04-02 DIAGNOSIS — I341 Nonrheumatic mitral (valve) prolapse: Secondary | ICD-10-CM | POA: Insufficient documentation

## 2019-04-02 DIAGNOSIS — Z8249 Family history of ischemic heart disease and other diseases of the circulatory system: Secondary | ICD-10-CM | POA: Diagnosis not present

## 2019-04-02 DIAGNOSIS — I4819 Other persistent atrial fibrillation: Secondary | ICD-10-CM

## 2019-04-02 DIAGNOSIS — E785 Hyperlipidemia, unspecified: Secondary | ICD-10-CM | POA: Diagnosis not present

## 2019-04-02 DIAGNOSIS — Z833 Family history of diabetes mellitus: Secondary | ICD-10-CM | POA: Diagnosis not present

## 2019-04-02 DIAGNOSIS — I110 Hypertensive heart disease with heart failure: Secondary | ICD-10-CM | POA: Diagnosis not present

## 2019-04-02 DIAGNOSIS — Z809 Family history of malignant neoplasm, unspecified: Secondary | ICD-10-CM | POA: Insufficient documentation

## 2019-04-02 DIAGNOSIS — Z880 Allergy status to penicillin: Secondary | ICD-10-CM | POA: Insufficient documentation

## 2019-04-02 DIAGNOSIS — I429 Cardiomyopathy, unspecified: Secondary | ICD-10-CM | POA: Diagnosis not present

## 2019-04-02 DIAGNOSIS — R001 Bradycardia, unspecified: Secondary | ICD-10-CM | POA: Insufficient documentation

## 2019-04-02 DIAGNOSIS — Z79899 Other long term (current) drug therapy: Secondary | ICD-10-CM | POA: Diagnosis not present

## 2019-04-02 LAB — CBC
HCT: 49.4 % (ref 39.0–52.0)
Hemoglobin: 16.2 g/dL (ref 13.0–17.0)
MCH: 30.2 pg (ref 26.0–34.0)
MCHC: 32.8 g/dL (ref 30.0–36.0)
MCV: 92.2 fL (ref 80.0–100.0)
Platelets: 210 10*3/uL (ref 150–400)
RBC: 5.36 MIL/uL (ref 4.22–5.81)
RDW: 12.4 % (ref 11.5–15.5)
WBC: 4.6 10*3/uL (ref 4.0–10.5)
nRBC: 0 % (ref 0.0–0.2)

## 2019-04-02 LAB — BASIC METABOLIC PANEL
Anion gap: 10 (ref 5–15)
BUN: 16 mg/dL (ref 8–23)
CO2: 24 mmol/L (ref 22–32)
Calcium: 8.9 mg/dL (ref 8.9–10.3)
Chloride: 106 mmol/L (ref 98–111)
Creatinine, Ser: 1.1 mg/dL (ref 0.61–1.24)
GFR calc Af Amer: >60 mL/min (ref >60)
GFR calc non Af Amer: >60 mL/min (ref >60)
Glucose, Bld: 100 mg/dL — ABNORMAL HIGH (ref 70–99)
Potassium: 4.4 mmol/L (ref 3.5–5.1)
Sodium: 140 mmol/L (ref 135–145)

## 2019-04-02 LAB — MAGNESIUM: Magnesium: 1.9 mg/dL (ref 1.7–2.4)

## 2019-04-02 MED ORDER — DOFETILIDE 250 MCG PO CAPS: 250.0000 ug | ORAL_CAPSULE | Freq: Two times a day (BID) | ORAL | 3 refills | Status: DC

## 2019-04-02 NOTE — Progress Notes (Signed)
Primary Care Physician: Lawerance Cruel, MD Referring Physician: Sharrell Ku, PA Primary EP: Dr Rayann Heman Primary Cardiologist: Dr Angelita Ingles Bonenberger is a 69 y.o. male with a h/o atrial fibrillation, first occurrence in 2017, with return to [facility] with cardioversion, with return to afib in February with URI. He saw Sharrell Ku at that time. BNP at  was 1200, echo showed EF at 45 % . Pt cut out all salt,lost 5 lbs since that visit. He reports that he is minimally symptomatic with afib, he is rate controlled, chronically on Bystolic. Avid exerciser, usually working out 5x a week. He had stopped his exercise routine since the URI with  return of afib.  He also took decongestants during that time. S/p dofetilide loading 01/2018. Repeat echocardiogram 10/25/18 showed improved EF 50-55%. He is on Eliquis for a CHADS2VASC score of 4.   On follow up today, patient reports that he has done very well since his last visit. He denies any heart racing or palpitations. He is tolerating the medication without difficulty.   Today, he denies symptoms of palpitations, chest pain, shortness of breath, orthopnea, PND, lower extremity edema, dizziness, presyncope, syncope, or neurologic sequela. The patient is tolerating medications without difficulties and is otherwise without complaint today.   Past Medical History:  Diagnosis Date   Aortic root dilation ([place])    a. mild by CT 10/2017.   Cardiomyopathy [facility])    Central retinal vein occlusion    Chronic systolic CHF (congestive heart failure) (HCC)    ED (erectile dysfunction)    H/O cardiac catheterization 2002 - normal cors   HTN (hypertension)    Hyperlipidemia    Hypokalemia    Leg edema    LV dysfunction    a. EF appeared mildly depressed by TEE 08/2015, no % given.   Mini stroke [facility])    a. ? Age 48 at time of central retinal vein occlusion - lost vision in eye   PAF (paroxysmal atrial fibrillation) ([place])    a. Dx 06/2015, s/p TEE/DCCV  08/2015.   Pericarditis 2010   Syncope    Hx of thought to be due to orthostatic hypotension   Visit for monitoring Tikosyn therapy 01/17/2018   Past Surgical History:  Procedure Laterality Date   CARDIAC CATHETERIZATION  2002   CARDIOVERSION N/A 09/08/2015   Procedure: CARDIOVERSION;  Surgeon: Fay Records, MD;  Location: [facility];  Service: Cardiovascular;  Laterality: N/A;   CARDIOVERSION N/A 06/28/2017   Procedure: CARDIOVERSION;  Surgeon: Jerline Pain, MD;  Location: [facility] ENDOSCOPY;  Service: Cardiovascular;  Laterality: N/A;   HAND SURGERY  2003   KNEE SURGERY     NASAL SINUS SURGERY  2003   x2. Scar tissue build up and had to recreate his tear duct   PERICARDIECTOMY     TEE WITHOUT CARDIOVERSION N/A 09/08/2015   Procedure: TRANSESOPHAGEAL ECHOCARDIOGRAM (TEE);  Surgeon: Fay Records, MD;  Location: [facility] ENDOSCOPY;  Service: Cardiovascular;  Laterality: N/A;   TEE WITHOUT CARDIOVERSION N/A 06/28/2017   Procedure: TRANSESOPHAGEAL ECHOCARDIOGRAM (TEE);  Surgeon: Jerline Pain, MD;  Location: [facility] ENDOSCOPY;  Service: Cardiovascular;  Laterality: N/A;    Current Outpatient Medications  Medication Sig Dispense Refill   albuterol (VENTOLIN HFA) 108 (90 Base) MCG/ACT inhaler Inhale 2 puffs into the lungs every 4 (four) hours as needed for wheezing or shortness of breath. 6.7 g 2   dofetilide (TIKOSYN) 250 MCG capsule Take 1 capsule (250 mcg total) by mouth 2 (  two) times daily. 180 capsule 3   ELIQUIS 5 MG TABS tablet TAKE 1 TABLET BY MOUTH TWICE A DAY 180 tablet 3   fluticasone (FLONASE) 50 MCG/ACT nasal spray Place 2 sprays into both nostrils every evening.      montelukast (SINGULAIR) 10 MG tablet Take 10 mg by mouth at bedtime.     Nebivolol HCl (BYSTOLIC) 20 MG TABS Take 1 tablet (20 mg total) by mouth daily. 90 tablet 3   potassium chloride (K-DUR) 10 MEQ tablet Take 1 tablet (10 mEq total) by mouth daily. 90 tablet 3   Probiotic Product (PROBIOTIC DAILY PO) Take  1 capsule by mouth daily.     rosuvastatin (CRESTOR) 5 MG tablet Take 5 mg by mouth daily.     tadalafil (CIALIS) 20 MG tablet Take 1 tablet (20 mg total) by mouth daily as needed. 15 tablet 5   No current facility-administered medications for this encounter.    Allergies  Allergen Reactions   Losartan Other (See Comments)    Makes BP drop very low   Penicillins Other (See Comments)    Childhood reaction Has patient had a PCN reaction causing immediate rash, facial/tongue/throat swelling, SOB or lightheadedness with hypotension: yes Has patient had a PCN reaction causing severe rash involving mucus membranes or skin necrosis: no Has patient had a PCN reaction that required hospitalization: no Has patient had a PCN reaction occurring within the last 10 years: no If all of the above answers are "NO", then may proceed with Cephalosporin use.     Social History   Socioeconomic History   Marital status: Married    Spouse name: Not on file   Number of children: Not on file   Years of education: Not on file   Highest education level: Not on file  Occupational History   Not on file  Tobacco Use   Smoking status: Never Smoker   Smokeless tobacco: Never Used  Substance and Sexual Activity   Alcohol use: Not Currently    Comment: 2-3 Times a Week Wine with Meals   Drug use: Never   Sexual activity: Not on file  Other Topics Concern   Not on file  Social History Narrative   Not on file   Social Determinants of Health   Financial Resource Strain:    Difficulty of Paying Living Expenses: Not on file  Food Insecurity:    Worried About [place] in the Last Year: Not on file   Ran Out of Food in the Last Year: Not on file  Transportation Needs:    Lack of Transportation (Medical): Not on file   Lack of Transportation (Non-Medical): Not on file  Physical Activity:    Days of Exercise per Week: Not on file   Minutes of Exercise per Session: Not  on file  Stress:    Feeling of Stress : Not on file  Social Connections:    Frequency of Communication with Friends and Family: Not on file   Frequency of Social Gatherings with Friends and Family: Not on file   Attends Religious Services: Not on file   Active Member of Clubs or Organizations: Not on file   Attends Archivist Meetings: Not on file   Marital Status: Not on file  Intimate Partner Violence:    Fear of Current or Ex-Partner: Not on file   Emotionally Abused: Not on file   Physically Abused: Not on file   Sexually Abused: Not on file  Family History  Problem Relation Age of Onset   Hypertension Mother    Diabetes Mother    Cancer Mother    Atrial fibrillation Father    Heart disease Father        had pacemaker   Hypertension Father    Diabetes Father     ROS- All systems are reviewed and negative except as per the HPI above  Physical Exam: Vitals:   04/02/19 0939  BP: 128/90  Pulse: (!) 53  Weight: 92.9 kg  Height: 6\' 1"  (1.854 m)   Wt Readings from Last 3 Encounters:  04/02/19 92.9 kg  03/02/19 91.6 kg  09/27/18 90.7 kg    Labs: Lab Results  Component Value Date   NA 140 04/02/2019   K 4.4 04/02/2019   CL 106 04/02/2019   CO2 24 04/02/2019   GLUCOSE 100 (H) 04/02/2019   BUN 16 04/02/2019   CREATININE 1.10 04/02/2019   CALCIUM 8.9 04/02/2019   MG 1.9 04/02/2019   Lab Results  Component Value Date   INR 1.0 04/25/2008   Lab Results  Component Value Date   CHOL 157 08/04/2018   HDL 42 08/04/2018   LDLCALC 94 08/04/2018   TRIG 107 08/04/2018    GEN- The patient is well appearing, alert and oriented x 3 today.   HEENT-head normocephalic, atraumatic, sclera clear, conjunctiva pink, hearing intact, trachea midline. Lungs- Clear to ausculation bilaterally, normal work of breathing Heart- Regular rate and rhythm, no murmurs, rubs or gallops  GI- soft, NT, ND, + BS Extremities- no clubbing, cyanosis, or  edema MS- no significant deformity or atrophy Skin- no rash or lesion Psych- euthymic mood, full affect Neuro- strength and sensation are intact   EKG- SB HR 53, PR 188, QRS 84, QTc 439  Echo- 10/25/18  1. The left ventricle has low normal systolic function, with an ejection fraction of 50-55%. The cavity size was normal. Left ventricular diastolic Doppler parameters are consistent with impaired relaxation.  2. The right ventricle has normal systolic function. The cavity was normal. There is no increase in right ventricular wall thickness.  3. Mild mitral valve prolapse.  4. The mitral valve is grossly normal. Mild thickening of the mitral valve leaflet.  5. The aortic valve is tricuspid. Aortic valve regurgitation was not assessed by color flow Doppler.  6. The aorta is normal unless otherwise noted.  7. The average left ventricular global longitudinal strain is -16.2 %.   Assessment and Plan: 1. Persistent atrial fibrillation S/p dofetilide loading 01/2018. Patient appears to be maintaining SR.  Continue dofetilide 250 mcg BID, QT stable.  Continue Bystolic 20 mg daily Continues Eliquis 5 mg bid Check bmet/mag/CBC today.  This patients CHA2DS2-VASc Score and unadjusted Ischemic Stroke Rate (% per year) is equal to 4.8 % stroke rate/year from a score of 4  Above score calculated as 1 point each if present [CHF, HTN, DM, Vascular=MI/PAD/Aortic Plaque, Age if 65-74, or Male] Above score calculated as 2 points each if present [Age > 75, or Stroke/TIA/TE]   2. HTN Stable, no changes today.  3. Chronic systolic CHF EF [id number] on TEE 06/28/17 Repeat echo 10/25/18 showed improved EF 50-55%. No signs or symptoms of fluid overload.   Follow up in the AF clinic in 3 months.    [facility] [address] [place], [place] [address] [phone]

## 2019-05-12 ENCOUNTER — Other Ambulatory Visit (HOSPITAL_COMMUNITY): Payer: Self-pay | Admitting: Physician Assistant

## 2019-05-23 ENCOUNTER — Institutional Professional Consult (permissible substitution): Payer: 59 | Admitting: Internal Medicine

## 2019-06-18 ENCOUNTER — Other Ambulatory Visit: Payer: Self-pay | Admitting: Physician Assistant

## 2019-07-04 ENCOUNTER — Telehealth (HOSPITAL_COMMUNITY): Payer: Self-pay | Admitting: *Deleted

## 2019-07-04 NOTE — Telephone Encounter (Signed)
preauthorization for Dofetilide approved through 07/03/20.

## 2019-07-05 ENCOUNTER — Ambulatory Visit (HOSPITAL_COMMUNITY)
Admission: RE | Admit: 2019-07-05 | Discharge: 2019-07-05 | Disposition: A | Discharge status: Home / Self Care | Payer: 59 | Source: Ambulatory Visit | Attending: Physician Assistant | Admitting: Physician Assistant

## 2019-07-05 ENCOUNTER — Encounter (HOSPITAL_COMMUNITY): Payer: Self-pay | Admitting: Physician Assistant

## 2019-07-05 ENCOUNTER — Other Ambulatory Visit: Payer: Self-pay

## 2019-07-05 VITALS — BP 138/80 | HR 55 | Ht 73.0 in | Wt 207.4 lb

## 2019-07-05 DIAGNOSIS — I4819 Other persistent atrial fibrillation: Secondary | ICD-10-CM | POA: Diagnosis not present

## 2019-07-05 DIAGNOSIS — Z79899 Other long term (current) drug therapy: Secondary | ICD-10-CM | POA: Diagnosis not present

## 2019-07-05 DIAGNOSIS — Z7901 Long term (current) use of anticoagulants: Secondary | ICD-10-CM | POA: Insufficient documentation

## 2019-07-05 DIAGNOSIS — I5022 Chronic systolic (congestive) heart failure: Secondary | ICD-10-CM | POA: Insufficient documentation

## 2019-07-05 DIAGNOSIS — E785 Hyperlipidemia, unspecified: Secondary | ICD-10-CM | POA: Diagnosis not present

## 2019-07-05 DIAGNOSIS — I110 Hypertensive heart disease with heart failure: Secondary | ICD-10-CM | POA: Diagnosis not present

## 2019-07-05 DIAGNOSIS — D6869 Other thrombophilia: Secondary | ICD-10-CM | POA: Diagnosis not present

## 2019-07-05 DIAGNOSIS — N529 Male erectile dysfunction, unspecified: Secondary | ICD-10-CM | POA: Insufficient documentation

## 2019-07-05 LAB — BASIC METABOLIC PANEL
Anion gap: 7 (ref 5–15)
BUN: 18 mg/dL (ref 8–23)
CO2: 25 mmol/L (ref 22–32)
Calcium: 8.9 mg/dL (ref 8.9–10.3)
Chloride: 107 mmol/L (ref 98–111)
Creatinine, Ser: 1.12 mg/dL (ref 0.61–1.24)
GFR calc Af Amer: >60 mL/min (ref >60)
GFR calc non Af Amer: >60 mL/min (ref >60)
Glucose, Bld: 92 mg/dL (ref 70–99)
Potassium: 4.1 mmol/L (ref 3.5–5.1)
Sodium: 139 mmol/L (ref 135–145)

## 2019-07-05 LAB — MAGNESIUM: Magnesium: 2 mg/dL (ref 1.7–2.4)

## 2019-07-05 NOTE — Progress Notes (Signed)
Primary Care Physician: Lawerance Cruel, MD Referring Physician: Sharrell Ku, PA Primary EP: Dr Rayann Heman Primary Cardiologist: Dr Angelita Ingles [name] is a 69 y.o. male with a h/o atrial fibrillation, first occurrence in 2017, with return to [facility] with cardioversion, with return to afib in February with URI. He saw Sharrell Ku at that time. BNP at  was 1200, echo showed EF at 45 % . Pt cut out all salt,lost 5 lbs since that visit. He reports that he is minimally symptomatic with afib, he is rate controlled, chronically on Bystolic. Avid exerciser, usually working out 5x a week. He had stopped his exercise routine since the URI with  return of afib.  He also took decongestants during that time. S/p dofetilide loading 01/2018. Repeat echocardiogram 10/25/18 showed improved EF 50-55%. He is on Eliquis for a CHADS2VASC score of 4.   On follow up today, patient reports that he has done well since his last visit. He denies any symptoms of afib. He denies bleeding issues on anticoagulation. He is tolerating the medication without difficulty.   Today, he denies symptoms of palpitations, chest pain, shortness of breath, orthopnea, PND, lower extremity edema, dizziness, presyncope, syncope, or neurologic sequela. The patient is tolerating medications without difficulties and is otherwise without complaint today.   Past Medical History:  Diagnosis Date  . Aortic root dilation (HCC)    a. mild by CT 10/2017.  . Cardiomyopathy ([place])   . Central retinal vein occlusion   . Chronic systolic CHF (congestive heart failure) ([place])   . ED (erectile dysfunction)   . H/O cardiac catheterization 2002 - normal cors  . HTN (hypertension)   . Hyperlipidemia   . Hypokalemia   . Leg edema   . LV dysfunction    a. EF appeared mildly depressed by TEE 08/2015, no % given.  . Mini stroke [facility])    a. ? Age 48 at time of central retinal vein occlusion - lost vision in eye  . PAF (paroxysmal atrial fibrillation) ([place])     a. Dx 06/2015, s/p TEE/DCCV 08/2015.  [name] Pericarditis 2010  . Syncope    Hx of thought to be due to orthostatic hypotension  . Visit for monitoring Tikosyn therapy 01/17/2018   Past Surgical History:  Procedure Laterality Date  . CARDIAC CATHETERIZATION  2002  . CARDIOVERSION N/A 09/08/2015   Procedure: CARDIOVERSION;  Surgeon: Fay Records, MD;  Location: [employer];  Service: Cardiovascular;  Laterality: N/A;  . CARDIOVERSION N/A 06/28/2017   Procedure: CARDIOVERSION;  Surgeon: Jerline Pain, MD;  Location: [facility] ENDOSCOPY;  Service: Cardiovascular;  Laterality: N/A;  . HAND SURGERY  2003  . KNEE SURGERY    . NASAL SINUS SURGERY  2003   x2. Scar tissue build up and had to recreate his tear duct  . PERICARDIECTOMY    . TEE WITHOUT CARDIOVERSION N/A 09/08/2015   Procedure: TRANSESOPHAGEAL ECHOCARDIOGRAM (TEE);  Surgeon: Fay Records, MD;  Location: [facility] ENDOSCOPY;  Service: Cardiovascular;  Laterality: N/A;  . TEE WITHOUT CARDIOVERSION N/A 06/28/2017   Procedure: TRANSESOPHAGEAL ECHOCARDIOGRAM (TEE);  Surgeon: Jerline Pain, MD;  Location: [facility] ENDOSCOPY;  Service: Cardiovascular;  Laterality: N/A;    Current Outpatient Medications  Medication Sig Dispense Refill  . albuterol (VENTOLIN HFA) 108 (90 Base) MCG/ACT inhaler Inhale 2 puffs into the lungs every 4 (four) hours as needed for wheezing or shortness of breath. 6.7 g 2  . BYSTOLIC 20 MG TABS TAKE 1 TABLET BY MOUTH EVERY  DAY 90 tablet 3  . dofetilide (TIKOSYN) 250 MCG capsule Take 1 capsule (250 mcg total) by mouth 2 (two) times daily. 180 capsule 3  . ELIQUIS 5 MG TABS tablet TAKE 1 TABLET BY MOUTH TWICE A DAY 180 tablet 3  . fluticasone (FLONASE) 50 MCG/ACT nasal spray Place 2 sprays into both nostrils every evening.     . montelukast (SINGULAIR) 10 MG tablet Take 10 mg by mouth at bedtime.    . pantoprazole (PROTONIX) 40 MG tablet Take 40 mg by mouth daily.    . potassium chloride (KLOR-CON) 10 MEQ tablet TAKE 1 TABLET BY MOUTH EVERY  DAY 90 tablet 3  . Probiotic Product (PROBIOTIC DAILY PO) Take 1 capsule by mouth daily.    . rosuvastatin (CRESTOR) 5 MG tablet Take 5 mg by mouth daily.    . tadalafil (CIALIS) 20 MG tablet Take 1 tablet (20 mg total) by mouth daily as needed. 15 tablet 5   No current facility-administered medications for this encounter.    Allergies  Allergen Reactions  . Losartan Other (See Comments)    Makes BP drop very low  . Penicillins Other (See Comments)    Childhood reaction Has patient had a PCN reaction causing immediate rash, facial/tongue/throat swelling, SOB or lightheadedness with hypotension: yes Has patient had a PCN reaction causing severe rash involving mucus membranes or skin necrosis: no Has patient had a PCN reaction that required hospitalization: no Has patient had a PCN reaction occurring within the last 10 years: no If all of the above answers are "NO", then may proceed with Cephalosporin use.     Social History   Socioeconomic History  . Marital status: Married    Spouse name: Not on file  . Number of children: Not on file  . Years of education: Not on file  . Highest education level: Not on file  Occupational History  . Not on file  Tobacco Use  . Smoking status: Never Smoker  . Smokeless tobacco: Never Used  Substance and Sexual Activity  . Alcohol use: Not Currently    Comment: 2-3 Times a Week Wine with Meals  . Drug use: Never  . Sexual activity: Not on file  Other Topics Concern  . Not on file  Social History Narrative  . Not on file   Social Determinants of Health   Financial Resource Strain:   . Difficulty of Paying Living Expenses:   Food Insecurity:   . Worried About Charity fundraiser in the Last Year:   . Arboriculturist in the Last Year:   Transportation Needs:   . Film/video editor (Medical):   [name] Lack of Transportation (Non-Medical):   Physical Activity:   . Days of Exercise per Week:   . Minutes of Exercise per Session:     Stress:   . Feeling of Stress :   Social Connections:   . Frequency of Communication with Friends and Family:   . Frequency of Social Gatherings with Friends and Family:   . Attends Religious Services:   . Active Member of Clubs or Organizations:   . Attends Archivist Meetings:   [name] Marital Status:   Intimate Partner Violence:   . Fear of Current or Ex-Partner:   . Emotionally Abused:   [name] Physically Abused:   . Sexually Abused:     Family History  Problem Relation Age of Onset  . Hypertension Mother   . Diabetes Mother   . Cancer  Mother   . Atrial fibrillation Father   . Heart disease Father        had pacemaker  . Hypertension Father   . Diabetes Father     ROS- All systems are reviewed and negative except as per the HPI above  Physical Exam: Vitals:   07/05/19 1547  BP: 138/80  Pulse: (!) 55  Weight: 94.1 kg  Height: 6\' 1"  (1.854 m)   Wt Readings from Last 3 Encounters:  07/05/19 94.1 kg  04/02/19 92.9 kg  03/02/19 91.6 kg    Labs: Lab Results  Component Value Date   NA 140 04/02/2019   K 4.4 04/02/2019   CL 106 04/02/2019   CO2 24 04/02/2019   GLUCOSE 100 (H) 04/02/2019   BUN 16 04/02/2019   CREATININE 1.10 04/02/2019   CALCIUM 8.9 04/02/2019   MG 1.9 04/02/2019   Lab Results  Component Value Date   INR 1.0 04/25/2008   Lab Results  Component Value Date   CHOL 157 08/04/2018   HDL 42 08/04/2018   LDLCALC 94 08/04/2018   TRIG 107 08/04/2018    GEN- The patient is well appearing, alert and oriented x 3 today.   HEENT-head normocephalic, atraumatic, sclera clear, conjunctiva pink, hearing intact, trachea midline. Lungs- Clear to ausculation bilaterally, normal work of breathing Heart- Regular rate and rhythm, no murmurs, rubs or gallops  GI- soft, NT, ND, + BS Extremities- no clubbing, cyanosis, or edema MS- no significant deformity or atrophy Skin- no rash or lesion Psych- euthymic mood, full affect Neuro- strength and  sensation are intact   EKG- SB HR 55, PR 200, QRS 88, QTc 447  Echo- 10/25/18  1. The left ventricle has low normal systolic function, with an ejection fraction of 50-55%. The cavity size was normal. Left ventricular diastolic Doppler parameters are consistent with impaired relaxation.  2. The right ventricle has normal systolic function. The cavity was normal. There is no increase in right ventricular wall thickness.  3. Mild mitral valve prolapse.  4. The mitral valve is grossly normal. Mild thickening of the mitral valve leaflet.  5. The aortic valve is tricuspid. Aortic valve regurgitation was not assessed by color flow Doppler.  6. The aorta is normal unless otherwise noted.  7. The average left ventricular global longitudinal strain is -16.2 %.   Assessment and Plan: 1. Persistent atrial fibrillation S/p dofetilide loading 01/2018. Patient appears to be maintaining SR. Continue dofetilide 250 mcg BID. QT stable. Continue Bystolic 20 mg daily Continues Eliquis 5 mg BID Check bmet/mag today.  This patients CHA2DS2-VASc Score and unadjusted Ischemic Stroke Rate (% per year) is equal to 4.8 % stroke rate/year from a score of 4  Above score calculated as 1 point each if present [CHF, HTN, DM, Vascular=MI/PAD/Aortic Plaque, Age if 65-74, or Male] Above score calculated as 2 points each if present [Age > 75, or Stroke/TIA/TE]  2. HTN Stable, no changes today.  3. Chronic systolic CHF EF [id number] on TEE 06/28/17 Repeat echo 10/25/18 showed improved EF 50-55%. No signs or symptoms of fluid overload.   Follow up in the AF clinic in 4 months.    [facility] [address] [place], [place] [address] [phone]

## 2019-07-15 ENCOUNTER — Other Ambulatory Visit: Payer: Self-pay | Admitting: Internal Medicine

## 2019-07-15 DIAGNOSIS — E785 Hyperlipidemia, unspecified: Secondary | ICD-10-CM

## 2019-07-15 DIAGNOSIS — I480 Paroxysmal atrial fibrillation: Secondary | ICD-10-CM

## 2019-07-15 DIAGNOSIS — I10 Essential (primary) hypertension: Secondary | ICD-10-CM

## 2019-07-16 NOTE — Telephone Encounter (Signed)
Pt last saw Clint Fenton, PA on 07/05/19, last labs 07/05/19 Creat 1.12, age 69, weight 94.1kg, based on specified criteria pt is on appropriate dosage of Eliquis 5mg  BID.  Will refill rx.

## 2019-10-02 ENCOUNTER — Other Ambulatory Visit: Payer: Self-pay

## 2019-10-02 ENCOUNTER — Encounter: Payer: Self-pay | Admitting: Dermatology

## 2019-10-02 ENCOUNTER — Ambulatory Visit: Payer: 59 | Admitting: Dermatology

## 2019-10-02 DIAGNOSIS — K219 Gastro-esophageal reflux disease without esophagitis: Secondary | ICD-10-CM | POA: Insufficient documentation

## 2019-10-02 DIAGNOSIS — M129 Arthropathy, unspecified: Secondary | ICD-10-CM | POA: Insufficient documentation

## 2019-10-02 DIAGNOSIS — D485 Neoplasm of uncertain behavior of skin: Secondary | ICD-10-CM

## 2019-10-02 DIAGNOSIS — I499 Cardiac arrhythmia, unspecified: Secondary | ICD-10-CM | POA: Insufficient documentation

## 2019-10-02 DIAGNOSIS — J309 Allergic rhinitis, unspecified: Secondary | ICD-10-CM | POA: Insufficient documentation

## 2019-10-02 DIAGNOSIS — R55 Syncope and collapse: Secondary | ICD-10-CM | POA: Insufficient documentation

## 2019-10-02 DIAGNOSIS — D229 Melanocytic nevi, unspecified: Secondary | ICD-10-CM

## 2019-10-02 DIAGNOSIS — C4491 Basal cell carcinoma of skin, unspecified: Secondary | ICD-10-CM

## 2019-10-02 DIAGNOSIS — E7800 Pure hypercholesterolemia, unspecified: Secondary | ICD-10-CM | POA: Insufficient documentation

## 2019-10-02 DIAGNOSIS — F411 Generalized anxiety disorder: Secondary | ICD-10-CM | POA: Insufficient documentation

## 2019-10-02 DIAGNOSIS — N529 Male erectile dysfunction, unspecified: Secondary | ICD-10-CM | POA: Insufficient documentation

## 2019-10-02 DIAGNOSIS — D225 Melanocytic nevi of trunk: Secondary | ICD-10-CM

## 2019-10-02 DIAGNOSIS — M25569 Pain in unspecified knee: Secondary | ICD-10-CM | POA: Insufficient documentation

## 2019-10-02 HISTORY — DX: Basal cell carcinoma of skin, unspecified: C44.91

## 2019-10-02 NOTE — Patient Instructions (Signed)

## 2019-10-02 NOTE — Progress Notes (Signed)
Cardiology Office Note    Date:  10/08/2019   ID:  Terry [name], DOB [birth year], MRN [id number]  PCP:  Lawerance Cruel, MD  Cardiologist: Mertie Moores, MD EPS: Thompson Grayer, MD  Chief Complaint  Patient presents with  . Follow-up    History of Present Illness:  Terry [name] is a 69 y.o. male with history of hypertension, chest pain with normal cath in 2002, PAF with multiple cardioversions.  A. fib clinic recommended Tikosyn and Dr. Acie Fredrickson felt he would be better in normal sinus rhythm.  Patient is followed by the A. fib clinic and is on Tikosyn.  Most recently seen 06/2019 and doing well.  Patient says he had a couple months of coughing up clear mucous that he thinks may have been allergies that has finally cleared after a course of antibiotics. Denies chest pain, palpitations, dizziness or presyncope. Bystolic has become unaffordable. Still working full time in [employer]. Walks 1 1/2-2 miles couple times a week and just joined a gym.     Past Medical History:  Diagnosis Date  . Aortic root dilation (HCC)    a. mild by CT 10/2017.  . Cardiomyopathy ([place])   . Central retinal vein occlusion   . Chronic systolic CHF (congestive heart failure) ([place])   . ED (erectile dysfunction)   . H/O cardiac catheterization 2002 - normal cors  . HTN (hypertension)   . Hyperlipidemia   . Hypokalemia   . Leg edema   . LV dysfunction    a. EF appeared mildly depressed by TEE 08/2015, no % given.  . Mini stroke [facility])    a. ? Age 24 at time of central retinal vein occlusion - lost vision in eye  . PAF (paroxysmal atrial fibrillation) ([place])    a. Dx 06/2015, s/p TEE/DCCV 08/2015.  [name] Pericarditis 2010  . Syncope    Hx of thought to be due to orthostatic hypotension  . Visit for monitoring Tikosyn therapy 01/17/2018    Past Surgical History:  Procedure Laterality Date  . CARDIAC CATHETERIZATION  2002  . CARDIOVERSION N/A 09/08/2015   Procedure: CARDIOVERSION;  Surgeon: Fay Records, MD;   Location: [place];  Service: Cardiovascular;  Laterality: N/A;  . CARDIOVERSION N/A 06/28/2017   Procedure: CARDIOVERSION;  Surgeon: Jerline Pain, MD;  Location: [facility] ENDOSCOPY;  Service: Cardiovascular;  Laterality: N/A;  . HAND SURGERY  2003  . KNEE SURGERY    . NASAL SINUS SURGERY  2003   x2. Scar tissue build up and had to recreate his tear duct  . PERICARDIECTOMY    . TEE WITHOUT CARDIOVERSION N/A 09/08/2015   Procedure: TRANSESOPHAGEAL ECHOCARDIOGRAM (TEE);  Surgeon: Fay Records, MD;  Location: [facility] ENDOSCOPY;  Service: Cardiovascular;  Laterality: N/A;  . TEE WITHOUT CARDIOVERSION N/A 06/28/2017   Procedure: TRANSESOPHAGEAL ECHOCARDIOGRAM (TEE);  Surgeon: Jerline Pain, MD;  Location: [facility] ENDOSCOPY;  Service: Cardiovascular;  Laterality: N/A;    Current Medications: Current Meds  Medication Sig  . albuterol (VENTOLIN HFA) 108 (90 Base) MCG/ACT inhaler Inhale 2 puffs into the lungs every 4 (four) hours as needed for wheezing or shortness of breath.  . BYSTOLIC 20 MG TABS TAKE 1 TABLET BY MOUTH EVERY DAY  . dofetilide (TIKOSYN) 250 MCG capsule Take 1 capsule (250 mcg total) by mouth 2 (two) times daily.  [name] ELIQUIS 5 MG TABS tablet TAKE 1 TABLET BY MOUTH TWICE A DAY  . fluticasone (FLONASE) 50 MCG/ACT nasal spray Place 2 sprays into both nostrils  every evening.   . montelukast (SINGULAIR) 10 MG tablet Take 10 mg by mouth at bedtime.  . pantoprazole (PROTONIX) 40 MG tablet Take 40 mg by mouth daily.  . potassium chloride (KLOR-CON) 10 MEQ tablet TAKE 1 TABLET BY MOUTH EVERY DAY  . Probiotic Product (PROBIOTIC DAILY PO) Take 1 capsule by mouth daily.  . rosuvastatin (CRESTOR) 5 MG tablet Take 5 mg by mouth daily.  . tadalafil (CIALIS) 20 MG tablet Take 1 tablet (20 mg total) by mouth daily as needed.     Allergies:   Losartan and Penicillins   Social History   Socioeconomic History  . Marital status: Married    Spouse name: Not on file  . Number of children: Not on file  .  Years of education: Not on file  . Highest education level: Not on file  Occupational History  . Not on file  Tobacco Use  . Smoking status: Never Smoker  . Smokeless tobacco: Never Used  Vaping Use  . Vaping Use: Never used  Substance and Sexual Activity  . Alcohol use: Not Currently    Comment: 2-3 Times a Week Wine with Meals  . Drug use: Never  . Sexual activity: Not on file  Other Topics Concern  . Not on file  Social History Narrative  . Not on file   Social Determinants of Health   Financial Resource Strain:   . Difficulty of Paying Living Expenses:   Food Insecurity:   . Worried About Charity fundraiser in the Last Year:   . Arboriculturist in the Last Year:   Transportation Needs:   . Film/video editor (Medical):   [name] Lack of Transportation (Non-Medical):   Physical Activity:   . Days of Exercise per Week:   . Minutes of Exercise per Session:   Stress:   . Feeling of Stress :   Social Connections:   . Frequency of Communication with Friends and Family:   . Frequency of Social Gatherings with Friends and Family:   . Attends Religious Services:   . Active Member of Clubs or Organizations:   . Attends Archivist Meetings:   [name] Marital Status:      Family History:  The patient's family history includes Atrial fibrillation in his father; Cancer in his mother; Diabetes in his father and mother; Heart disease in his father; Hypertension in his father and mother.   ROS:   Please see the history of present illness.    ROS All other systems reviewed and are negative.   PHYSICAL EXAM:   VS:  BP 118/84   Pulse 53   Ht 6\' 1"  (1.854 m)   Wt (!) 203 lb 9.6 oz (92.4 kg)   SpO2 96%   BMI 26.86 kg/m   Physical Exam  GEN: Well nourished, well developed, in no acute distress  Neck: no JVD, carotid bruits, or masses Cardiac:RRR; no murmurs, rubs, or gallops  Respiratory:  clear to auscultation bilaterally, normal work of breathing GI: soft, nontender,  nondistended, + BS Ext: without cyanosis, clubbing, or edema, Good distal pulses bilaterally Neuro:  Alert and Oriented x 3Psych: euthymic mood, full affect   Wt Readings from Last 3 Encounters:  10/08/19 (!) 203 lb 9.6 oz (92.4 kg)  07/05/19 207 lb 6.4 oz (94.1 kg)  04/02/19 204 lb 12.8 oz (92.9 kg)      Studies/Labs Reviewed:   EKG:  EKG is not ordered today.    Recent Labs:  04/02/2019: Hemoglobin 16.2; Platelets 210 07/05/2019: BUN 18; Creatinine, Ser 1.12; Magnesium 2.0; Potassium 4.1; Sodium 139   Lipid Panel    Component Value Date/Time   CHOL 157 08/04/2018 0754   TRIG 107 08/04/2018 0754   HDL 42 08/04/2018 0754   CHOLHDL 3.7 08/04/2018 0754   CHOLHDL 4 02/22/2011 0914   VLDL 17.4 02/22/2011 0914   LDLCALC 94 08/04/2018 0754    Additional studies/ records that were reviewed today include:  Echo- 10/25/18  1. The left ventricle has low normal systolic function, with an ejection fraction of 50-55%. The cavity size was normal. Left ventricular diastolic Doppler parameters are consistent with impaired relaxation.  2. The right ventricle has normal systolic function. The cavity was normal. There is no increase in right ventricular wall thickness.  3. Mild mitral valve prolapse.  4. The mitral valve is grossly normal. Mild thickening of the mitral valve leaflet.  5. The aortic valve is tricuspid. Aortic valve regurgitation was not assessed by color flow Doppler.  6. The aorta is normal unless otherwise noted.  7. The average left ventricular global longitudinal strain is -16.2 %.       ASSESSMENT:    1. Paroxysmal atrial fibrillation (HCC)   2. Essential hypertension   3. Cardiomyopathy, unspecified type ([place])   4. History of chest pain      PLAN:  In order of problems listed above:  PAF now on Tikosyn and Eliquis followed by the A. fib clinic.  QTc stable 06/2019 CHA2DS2-VASc equals 4. No bleeding problems or breakthrough Afib. Check surveillance  labs  Essential hypertension BP controlled on bystolic but unaffordable. Will switch to toprol xl 100 mg daily is the equivalent. He will watch his HR.  History of chest pain with normal cardiac cath in 2002  History of cardiomyopathy most recent echo 10/2018 normal LVEF 50 to 55% with impaired relaxation.     Medication Adjustments/Labs and Tests Ordered: Current medicines are reviewed at length with the patient today.  Concerns regarding medicines are outlined above.  Medication changes, Labs and Tests ordered today are listed in the Patient Instructions below. There are no Patient Instructions on file for this visit.   Signed, Ermalinda Barrios, PA-C  10/08/2019 8:10 AM    [facility] [place], [place], [place]  [address] Phone: [phone]; Fax: [phone]

## 2019-10-08 ENCOUNTER — Encounter: Payer: Self-pay | Admitting: Dermatology

## 2019-10-08 ENCOUNTER — Other Ambulatory Visit: Payer: Self-pay

## 2019-10-08 ENCOUNTER — Ambulatory Visit: Payer: 59 | Admitting: Physician Assistant

## 2019-10-08 ENCOUNTER — Telehealth: Payer: Self-pay

## 2019-10-08 ENCOUNTER — Encounter: Payer: Self-pay | Admitting: Physician Assistant

## 2019-10-08 ENCOUNTER — Telehealth: Payer: Self-pay | Admitting: Dermatology

## 2019-10-08 VITALS — BP 118/84 | HR 53 | Ht 73.0 in | Wt 203.6 lb

## 2019-10-08 DIAGNOSIS — I10 Essential (primary) hypertension: Secondary | ICD-10-CM | POA: Diagnosis not present

## 2019-10-08 DIAGNOSIS — I429 Cardiomyopathy, unspecified: Secondary | ICD-10-CM

## 2019-10-08 DIAGNOSIS — Z87898 Personal history of other specified conditions: Secondary | ICD-10-CM

## 2019-10-08 DIAGNOSIS — I480 Paroxysmal atrial fibrillation: Secondary | ICD-10-CM | POA: Diagnosis not present

## 2019-10-08 DIAGNOSIS — E785 Hyperlipidemia, unspecified: Secondary | ICD-10-CM

## 2019-10-08 LAB — COMPREHENSIVE METABOLIC PANEL
ALT: 18 IU/L (ref 0–44)
AST: 17 IU/L (ref 0–40)
Albumin/Globulin Ratio: 2.1 (ref 1.2–2.2)
Albumin: 4 g/dL (ref 3.8–4.8)
Alkaline Phosphatase: 54 IU/L (ref 48–121)
BUN/Creatinine Ratio: 17 (ref 10–24)
BUN: 19 mg/dL (ref 8–27)
Bilirubin Total: 1 mg/dL (ref 0.0–1.2)
CO2: 24 mmol/L (ref 20–29)
Calcium: 8.9 mg/dL (ref 8.6–10.2)
Chloride: 106 mmol/L (ref 96–106)
Creatinine, Ser: 1.12 mg/dL (ref 0.76–1.27)
GFR calc Af Amer: 79 mL/min/{1.73_m2} (ref >59)
GFR calc non Af Amer: 68 mL/min/{1.73_m2} (ref >59)
Globulin, Total: 1.9 g/dL (ref 1.5–4.5)
Glucose: 93 mg/dL (ref 65–99)
Potassium: 4 mmol/L (ref 3.5–5.2)
Sodium: 143 mmol/L (ref 134–144)
Total Protein: 5.9 g/dL — ABNORMAL LOW (ref 6.0–8.5)

## 2019-10-08 LAB — LIPID PANEL
Chol/HDL Ratio: 4.3 ratio (ref 0.0–5.0)
Cholesterol, Total: 155 mg/dL (ref 100–199)
HDL: 36 mg/dL — ABNORMAL LOW (ref >39)
LDL Chol Calc (NIH): 95 mg/dL (ref 0–99)
Triglycerides: 133 mg/dL (ref 0–149)
VLDL Cholesterol Cal: 24 mg/dL (ref 5–40)

## 2019-10-08 LAB — CBC
Hematocrit: 48.2 % (ref 37.5–51.0)
Hemoglobin: 16.1 g/dL (ref 13.0–17.7)
MCH: 30.1 pg (ref 26.6–33.0)
MCHC: 33.4 g/dL (ref 31.5–35.7)
MCV: 90 fL (ref 79–97)
Platelets: 209 10*3/uL (ref 150–450)
RBC: 5.34 x10E6/uL (ref 4.14–5.80)
RDW: 13.1 % (ref 11.6–15.4)
WBC: 5.4 10*3/uL (ref 3.4–10.8)

## 2019-10-08 LAB — MAGNESIUM: Magnesium: 2.2 mg/dL (ref 1.6–2.3)

## 2019-10-08 MED ORDER — METOPROLOL SUCCINATE ER 100 MG PO TB24
100.0000 mg | ORAL_TABLET | Freq: Every day | ORAL | 3 refills | Status: DC
Start: 2019-10-08 — End: 2020-01-01

## 2019-10-08 NOTE — Telephone Encounter (Signed)
Pathology to patient and surgery scheduled.

## 2019-10-08 NOTE — Telephone Encounter (Signed)
-----   Message from Lavonna Monarch, MD sent at 10/05/2019 10:31 PM EDT ----- Schedule surgery with Dr. Darene Lamer

## 2019-10-08 NOTE — Telephone Encounter (Signed)
Patient's wife Monia Pouch (DPR on file) calling for patient biopsy results.

## 2019-10-08 NOTE — Patient Instructions (Signed)
Medication Instructions:  Your physician has recommended you make the following change in your medication:  STOP taking Bystolic START taking Metoprolol Succinate 100mg  daily  If you need a refill on your cardiac medications before your next appointment, please call your pharmacy*   Lab Work: CBC, CMET, Magnesium, Lipids Today  If you have labs (blood work) drawn today and your tests are completely normal, you will receive your results only by: [name] MyChart Message (if you have MyChart) OR . A paper copy in the mail If you have any lab test that is abnormal or we need to change your treatment, we will call you to review the results.   Testing/Procedures: None   Follow-Up: At [facility], you and your health needs are our priority.  As part of our continuing mission to provide you with exceptional heart care, we have created designated Provider Care Teams.  These Care Teams include your primary Cardiologist (physician) and Advanced Practice Providers (APPs -  Physician Assistants and Nurse Practitioners) who all work together to provide you with the care you need, when you need it.  We recommend signing up for the patient portal called "MyChart".  Sign up information is provided on this After Visit Summary.  MyChart is used to connect with patients for Virtual Visits (Telemedicine).  Patients are able to view lab/test results, encounter notes, upcoming appointments, etc.  Non-urgent messages can be sent to your provider as well.   To learn more about what you can do with MyChart, go to [email or web address].    Your next appointment:   12 month(s)  The format for your next appointment:   In Person  Provider:   You may see Mertie Moores, MD or one of the following Advanced Practice Providers on your designated Care Team:    [name], PA-C  Robbie Lis, [place]    Other Instructions None

## 2019-10-09 ENCOUNTER — Telehealth: Payer: Self-pay | Admitting: *Deleted

## 2019-10-09 NOTE — Telephone Encounter (Signed)
Patient notified. He takes potassium 10 meq daily and will continue this. He is asking about weight. I told him he could weigh himself daily and let us know if he gains 3 lbs over night or 5 lbs in a week or if he notes any swelling.

## 2019-10-09 NOTE — Telephone Encounter (Signed)
-----   Message from Thayer Headings, MD sent at 10/09/2019  2:40 PM EDT ----- Labs are stable.   Potassium is at the lower limits of normal . Mag is normal He may eat some extra potassium containing foods if he would like

## 2019-10-16 ENCOUNTER — Ambulatory Visit: Payer: 59 | Admitting: Dermatology

## 2019-10-17 ENCOUNTER — Ambulatory Visit: Payer: 59 | Admitting: Cardiovascular Disease

## 2019-11-01 ENCOUNTER — Other Ambulatory Visit: Payer: Self-pay

## 2019-11-01 ENCOUNTER — Ambulatory Visit (HOSPITAL_COMMUNITY)
Admission: RE | Admit: 2019-11-01 | Discharge: 2019-11-01 | Disposition: A | Discharge status: Home / Self Care | Payer: 59 | Source: Ambulatory Visit | Attending: Physician Assistant | Admitting: Physician Assistant

## 2019-11-01 VITALS — BP 134/76 | HR 55 | Ht 73.0 in | Wt 204.2 lb

## 2019-11-01 DIAGNOSIS — I5022 Chronic systolic (congestive) heart failure: Secondary | ICD-10-CM | POA: Diagnosis not present

## 2019-11-01 DIAGNOSIS — Z79899 Other long term (current) drug therapy: Secondary | ICD-10-CM | POA: Insufficient documentation

## 2019-11-01 DIAGNOSIS — Z7901 Long term (current) use of anticoagulants: Secondary | ICD-10-CM | POA: Diagnosis not present

## 2019-11-01 DIAGNOSIS — E785 Hyperlipidemia, unspecified: Secondary | ICD-10-CM | POA: Insufficient documentation

## 2019-11-01 DIAGNOSIS — D6869 Other thrombophilia: Secondary | ICD-10-CM

## 2019-11-01 DIAGNOSIS — I110 Hypertensive heart disease with heart failure: Secondary | ICD-10-CM | POA: Diagnosis not present

## 2019-11-01 DIAGNOSIS — I4819 Other persistent atrial fibrillation: Secondary | ICD-10-CM | POA: Insufficient documentation

## 2019-11-01 NOTE — Progress Notes (Signed)
Primary Care Physician: Lawerance Cruel, MD Referring Physician: Sharrell Ku, PA Primary EP: Dr Rayann Heman Primary Cardiologist: Dr Angelita Ingles [name] is a 68 y.o. male with a h/o atrial fibrillation, first occurrence in 2017, with return to [facility] with cardioversion, with return to afib in February with URI. He saw Sharrell Ku at that time. BNP at  was 1200, echo showed EF at 45 % . Pt cut out all salt,lost 5 lbs since that visit. He reports that he is minimally symptomatic with afib, he is rate controlled, chronically on Bystolic. Avid exerciser, usually working out 5x a week. He had stopped his exercise routine since the URI with  return of afib.  He also took decongestants during that time. S/p dofetilide loading 01/2018. Repeat echocardiogram 10/25/18 showed improved EF 50-55%. He is on Eliquis for a CHADS2VASC score of 4.   On follow up today, patient reports that he has done very well since his last visit. He denies any heart racing or palpitations. He denies any bleeding issues on anticoagulation.   Today, he denies symptoms of palpitations, chest pain, shortness of breath, orthopnea, PND, lower extremity edema, dizziness, presyncope, syncope, or neurologic sequela. The patient is tolerating medications without difficulties and is otherwise without complaint today.   Past Medical History:  Diagnosis Date  . Aortic root dilation (HCC)    a. mild by CT 10/2017.  [name] Basal cell carcinoma 10/02/2019   left hand bcc nod.  . Cardiomyopathy ([place])   . Central retinal vein occlusion   . Chronic systolic CHF (congestive heart failure) ([place])   . ED (erectile dysfunction)   . H/O cardiac catheterization 2002 - normal cors  . HTN (hypertension)   . Hyperlipidemia   . Hypokalemia   . Leg edema   . LV dysfunction    a. EF appeared mildly depressed by TEE 08/2015, no % given.  . Mini stroke [facility])    a. ? Age 12 at time of central retinal vein occlusion - lost vision in eye  . PAF (paroxysmal  atrial fibrillation) ([place])    a. Dx 06/2015, s/p TEE/DCCV 08/2015.  [name] Pericarditis 2010  . Syncope    Hx of thought to be due to orthostatic hypotension  . Visit for monitoring Tikosyn therapy 01/17/2018   Past Surgical History:  Procedure Laterality Date  . CARDIAC CATHETERIZATION  2002  . CARDIOVERSION N/A 09/08/2015   Procedure: CARDIOVERSION;  Surgeon: Fay Records, MD;  Location: [place];  Service: Cardiovascular;  Laterality: N/A;  . CARDIOVERSION N/A 06/28/2017   Procedure: CARDIOVERSION;  Surgeon: Jerline Pain, MD;  Location: [facility] ENDOSCOPY;  Service: Cardiovascular;  Laterality: N/A;  . HAND SURGERY  2003  . KNEE SURGERY    . NASAL SINUS SURGERY  2003   x2. Scar tissue build up and had to recreate his tear duct  . PERICARDIECTOMY    . TEE WITHOUT CARDIOVERSION N/A 09/08/2015   Procedure: TRANSESOPHAGEAL ECHOCARDIOGRAM (TEE);  Surgeon: Fay Records, MD;  Location[facility] ENDOSCOPY;  Service: Cardiovascular;  Laterality: N/A;  . TEE WITHOUT CARDIOVERSION N/A 06/28/2017   Procedure: TRANSESOPHAGEAL ECHOCARDIOGRAM (TEE);  Surgeon: Jerline Pain, MD;  Location: [facility] ENDOSCOPY;  Service: Cardiovascular;  Laterality: N/A;    Current Outpatient Medications  Medication Sig Dispense Refill  . albuterol (VENTOLIN HFA) 108 (90 Base) MCG/ACT inhaler Inhale 2 puffs into the lungs every 4 (four) hours as needed for wheezing or shortness of breath. 6.7 g 2  . dofetilide (TIKOSYN)  250 MCG capsule Take 1 capsule (250 mcg total) by mouth 2 (two) times daily. 180 capsule 3  . ELIQUIS 5 MG TABS tablet TAKE 1 TABLET BY MOUTH TWICE A DAY 180 tablet 1  . fluticasone (FLONASE) 50 MCG/ACT nasal spray Place 2 sprays into both nostrils every evening.     . metoprolol succinate (TOPROL-XL) 100 MG 24 hr tablet Take 1 tablet (100 mg total) by mouth daily. Take with or immediately following a meal. 90 tablet 3  . montelukast (SINGULAIR) 10 MG tablet Take 10 mg by mouth at bedtime.    . pantoprazole (PROTONIX) 40  MG tablet Take 40 mg by mouth daily.    . potassium chloride (KLOR-CON) 10 MEQ tablet TAKE 1 TABLET BY MOUTH EVERY DAY 90 tablet 3  . Probiotic Product (PROBIOTIC DAILY PO) Take 1 capsule by mouth daily.    . rosuvastatin (CRESTOR) 5 MG tablet Take 5 mg by mouth daily.    . tadalafil (CIALIS) 20 MG tablet Take 1 tablet (20 mg total) by mouth daily as needed. 15 tablet 5   No current facility-administered medications for this encounter.    Allergies  Allergen Reactions  . Losartan Other (See Comments)    Makes BP drop very low  . Penicillins Other (See Comments)    Childhood reaction Has patient had a PCN reaction causing immediate rash, facial/tongue/throat swelling, SOB or lightheadedness with hypotension: yes Has patient had a PCN reaction causing severe rash involving mucus membranes or skin necrosis: no Has patient had a PCN reaction that required hospitalization: no Has patient had a PCN reaction occurring within the last 10 years: no If all of the above answers are "NO", then may proceed with Cephalosporin use.     Social History   Socioeconomic History  . Marital status: Married    Spouse name: Not on file  . Number of children: Not on file  . Years of education: Not on file  . Highest education level: Not on file  Occupational History  . Not on file  Tobacco Use  . Smoking status: Never Smoker  . Smokeless tobacco: Never Used  Vaping Use  . Vaping Use: Never used  Substance and Sexual Activity  . Alcohol use: Not Currently    Comment: 2-3 Times a Week Wine with Meals  . Drug use: Never  . Sexual activity: Not on file  Other Topics Concern  . Not on file  Social History Narrative  . Not on file   Social Determinants of Health   Financial Resource Strain:   . Difficulty of Paying Living Expenses: Not on file  Food Insecurity:   . Worried About Charity fundraiser in the Last Year: Not on file  . Ran Out of Food in the Last Year: Not on file  Transportation  Needs:   . Lack of Transportation (Medical): Not on file  . Lack of Transportation (Non-Medical): Not on file  Physical Activity:   . Days of Exercise per Week: Not on file  . Minutes of Exercise per Session: Not on file  Stress:   . Feeling of Stress : Not on file  Social Connections:   . Frequency of Communication with Friends and Family: Not on file  . Frequency of Social Gatherings with Friends and Family: Not on file  . Attends Religious Services: Not on file  . Active Member of Clubs or Organizations: Not on file  . Attends Archivist Meetings: Not on file  .  Marital Status: Not on file  Intimate Partner Violence:   . Fear of Current or Ex-Partner: Not on file  . Emotionally Abused: Not on file  . Physically Abused: Not on file  . Sexually Abused: Not on file    Family History  Problem Relation Age of Onset  . Hypertension Mother   . Diabetes Mother   . Cancer Mother   . Atrial fibrillation Father   . Heart disease Father        had pacemaker  . Hypertension Father   . Diabetes Father     ROS- All systems are reviewed and negative except as per the HPI above  Physical Exam: Vitals:   11/01/19 1537  BP: 134/76  Pulse: (!) 55  Weight: 92.6 kg  Height: 6\' 1"  (1.854 m)   Wt Readings from Last 3 Encounters:  11/01/19 92.6 kg  10/08/19 (!) 92.4 kg  07/05/19 94.1 kg    Labs: Lab Results  Component Value Date   NA 143 10/08/2019   K 4.0 10/08/2019   CL 106 10/08/2019   CO2 24 10/08/2019   GLUCOSE 93 10/08/2019   BUN 19 10/08/2019   CREATININE 1.12 10/08/2019   CALCIUM 8.9 10/08/2019   MG 2.2 10/08/2019   Lab Results  Component Value Date   INR 1.0 04/25/2008   Lab Results  Component Value Date   CHOL 155 10/08/2019   HDL 36 (L) 10/08/2019   [place] 95 10/08/2019   TRIG 133 10/08/2019    GEN- The patient is well appearing, alert and oriented x 3 today.   HEENT-head normocephalic, atraumatic, sclera clear, conjunctiva pink, hearing  intact, trachea midline. Lungs- Clear to ausculation bilaterally, normal work of breathing Heart- Regular rate and rhythm, no murmurs, rubs or gallops  GI- soft, NT, ND, + BS Extremities- no clubbing, cyanosis, or edema MS- no significant deformity or atrophy Skin- no rash or lesion Psych- euthymic mood, full affect Neuro- strength and sensation are intact   EKG- SB HR 55, PR 196, QRS 90, QTc 428  Echo- 10/25/18  1. The left ventricle has low normal systolic function, with an ejection fraction of 50-55%. The cavity size was normal. Left ventricular diastolic Doppler parameters are consistent with impaired relaxation.  2. The right ventricle has normal systolic function. The cavity was normal. There is no increase in right ventricular wall thickness.  3. Mild mitral valve prolapse.  4. The mitral valve is grossly normal. Mild thickening of the mitral valve leaflet.  5. The aortic valve is tricuspid. Aortic valve regurgitation was not assessed by color flow Doppler.  6. The aorta is normal unless otherwise noted.  7. The average left ventricular global longitudinal strain is -16.2 %.   Assessment and Plan: 1. Persistent atrial fibrillation S/p dofetilide loading 01/2018. Patient appears to be maintaining SR. Continue dofetilide 250 mcg BID. QT stable. Continue Toprol 100 mg daily Continues Eliquis 5 mg BID Recent cmet/mag reviewed   This patients CHA2DS2-VASc Score and unadjusted Ischemic Stroke Rate (% per year) is equal to 4.8 % stroke rate/year from a score of 4  Above score calculated as 1 point each if present [CHF, HTN, DM, Vascular=MI/PAD/Aortic Plaque, Age if 65-74, or Male] Above score calculated as 2 points each if present [Age > 75, or Stroke/TIA/TE]  2. HTN Stable, no changes today.  3. Chronic systolic CHF EF 44-01% on TEE 06/28/17 Repeat echo 10/25/18 showed improved EF 50-55%. No signs or symptoms of fluid overload.   Follow up  in the AF clinic in 4 months.      [facility] [address] [place], [place] [address] [phone]

## 2019-11-06 ENCOUNTER — Encounter: Payer: Self-pay | Admitting: Dermatology

## 2019-11-06 NOTE — Progress Notes (Signed)
   Follow-Up Visit   Subjective  [name] is a 69 y.o. male who presents for the following: Skin Problem (spot on left hand, present for a year, history of Cryo on spot).  Crust left hand Location:  Duration:  Quality:  Associated Signs/Symptoms: Modifying Factors: Previous freezing produce only transient improvement Severity:  Timing: Context: Wife also noticed dark spot on back.  Objective  Well appearing patient in no apparent distress; mood and affect are within normal limits.  All skin waist up examined.   Assessment & Plan    Neoplasm of uncertain behavior of skin Left Hand - Posterior  Skin / nail biopsy Type of biopsy: tangential   Informed consent: discussed and consent obtained   Timeout: patient name, date of birth, surgical site, and procedure verified   Procedure prep:  Patient was prepped and draped in usual sterile fashion Prep type:  Chlorhexidine Anesthesia: the lesion was anesthetized in a standard fashion   Anesthetic:  1% lidocaine w/ epinephrine 1-100,000 local infiltration Instrument used: flexible razor blade   Hemostasis achieved with: ferric subsulfate   Outcome: patient tolerated procedure well   Post-procedure details: wound care instructions given    Specimen 1 - Surgical pathology Differential Diagnosis: SCC vs BCC Check Margins: No  Nevus Mid Back  Self examination twice annually, dermatologist examination yearly.     I, [facility] Monarch, MD, have reviewed all documentation for this visit.  The documentation on 11/06/19 for the exam, diagnosis, procedures, and orders are all accurate and complete.

## 2019-12-06 ENCOUNTER — Other Ambulatory Visit: Payer: Self-pay

## 2019-12-06 ENCOUNTER — Ambulatory Visit (INDEPENDENT_AMBULATORY_CARE_PROVIDER_SITE_OTHER): Payer: 59 | Admitting: Dermatology

## 2019-12-06 ENCOUNTER — Encounter: Payer: Self-pay | Admitting: Dermatology

## 2019-12-06 DIAGNOSIS — C4491 Basal cell carcinoma of skin, unspecified: Secondary | ICD-10-CM

## 2019-12-06 DIAGNOSIS — C44619 Basal cell carcinoma of skin of left upper limb, including shoulder: Secondary | ICD-10-CM | POA: Diagnosis not present

## 2019-12-06 NOTE — Progress Notes (Signed)
Left hand posterior - curet cautery 5FU 1.0 cm Tx size

## 2020-01-01 ENCOUNTER — Other Ambulatory Visit: Payer: Self-pay

## 2020-01-01 MED ORDER — METOPROLOL SUCCINATE ER 100 MG PO TB24: 100.0000 mg | ORAL_TABLET | Freq: Every day | ORAL | 2 refills | Status: DC

## 2020-01-01 NOTE — Telephone Encounter (Signed)
Pt's medication was sent to pt's pharmacy as requested. Confirmation received.  °

## 2020-01-08 ENCOUNTER — Other Ambulatory Visit: Payer: Self-pay | Admitting: Internal Medicine

## 2020-01-08 DIAGNOSIS — I10 Essential (primary) hypertension: Secondary | ICD-10-CM

## 2020-01-08 DIAGNOSIS — E785 Hyperlipidemia, unspecified: Secondary | ICD-10-CM

## 2020-01-08 DIAGNOSIS — I480 Paroxysmal atrial fibrillation: Secondary | ICD-10-CM

## 2020-01-08 NOTE — Telephone Encounter (Signed)
Age 69, weight 92.6kg, SCr 1.12 on 10/08/19 afib indication, last visit 11/01/19

## 2020-01-09 NOTE — Progress Notes (Signed)
   Follow-Up Visit   Subjective  [name] is a 69 y.o. male who presents for the following: Procedure (left hand bcc).  Procudure Location: left hand Duration:  Quality:  Associated Signs/Symptoms: Modifying Factors:  Severity:  Timing: Context:   Objective  Well appearing patient in no apparent distress; mood and affect are within normal limits.  A focused examination was performed including left hand. Relevant physical exam findings are noted in the Assessment and Plan.   Assessment & Plan    Basal cell carcinoma (BCC), unspecified site Left Hand - Posterior  Destruction of lesion Complexity: simple   Destruction method: electrodesiccation and curettage   Informed consent: discussed and consent obtained   Timeout:  patient name, date of birth, surgical site, and procedure verified Anesthesia: the lesion was anesthetized in a standard fashion   Anesthetic:  1% lidocaine w/ epinephrine 1-100,000 local infiltration Curettage performed in three different directions: Yes   Curettage cycles:  3 Lesion length (cm):  1.2 Lesion width (cm):  1 Margin per side (cm):  0 Final wound size (cm):  1.2 Hemostasis achieved with:  ferric subsulfate Outcome: patient tolerated procedure well with no complications   Post-procedure details: sterile dressing applied and wound care instructions given   Dressing type: bandage and petrolatum   Additional details:  Wound inoculated with 5% Fluorouracil.    Follow up in 3-4 months.      I, Lavonna Monarch, MD, have reviewed all documentation for this visit.  The documentation on 01/12/20 for the exam, diagnosis, procedures, and orders are all accurate and complete.

## 2020-01-12 ENCOUNTER — Encounter: Payer: Self-pay | Admitting: Dermatology

## 2020-02-12 ENCOUNTER — Telehealth: Payer: Self-pay | Admitting: Cardiovascular Disease

## 2020-02-12 MED ORDER — APIXABAN 5 MG PO TABS: 5.0000 mg | ORAL_TABLET | Freq: Two times a day (BID) | ORAL | 3 refills | Status: DC

## 2020-02-12 NOTE — Telephone Encounter (Signed)
I spoke with the pt re: his Eliquis.. pt has Airline pilot...  I advised him that I am leaving a 10.00 CoPay card at the front desk for him to pick up.   I sent in a new RX for him.   I also forwarded to the Pharm D pool.

## 2020-02-12 NOTE — Telephone Encounter (Signed)
Noted - pt added to spreadsheet and will submit PA in January for pt to continue on Eliquis.

## 2020-02-12 NOTE — Telephone Encounter (Signed)
Pt c/o medication issue:  1. Name of Medication: ELIQUIS 5 MG TABS tablet  2. How are you currently taking this medication (dosage and times per day)? 1 tablet by mouth two times daily   3. Are you having a reaction (difficulty breathing--STAT)? No   4. What is your medication issue? [name] is calling stating his insurance company sent him a letter stating his Eliquis will no longer be covered and they suggested the alternatives Warfarin or Xarelto. Please advise.

## 2020-03-05 ENCOUNTER — Ambulatory Visit (INDEPENDENT_AMBULATORY_CARE_PROVIDER_SITE_OTHER): Payer: 59 | Admitting: Dermatology

## 2020-03-05 ENCOUNTER — Other Ambulatory Visit: Payer: Self-pay

## 2020-03-05 ENCOUNTER — Encounter: Payer: Self-pay | Admitting: Dermatology

## 2020-03-05 DIAGNOSIS — D485 Neoplasm of uncertain behavior of skin: Secondary | ICD-10-CM | POA: Diagnosis not present

## 2020-03-05 DIAGNOSIS — Z85828 Personal history of other malignant neoplasm of skin: Secondary | ICD-10-CM | POA: Diagnosis not present

## 2020-03-05 NOTE — Patient Instructions (Signed)

## 2020-03-08 ENCOUNTER — Encounter: Payer: Self-pay | Admitting: Dermatology

## 2020-03-08 NOTE — Progress Notes (Signed)
   Follow-Up Visit   Subjective  [name] is a 69 y.o. male who presents for the following: Follow-up (Follow up [place] ON HAND CLEAR/SMOOTH). New crust Location: Left neck Duration:  Quality:  Associated Signs/Symptoms: Modifying Factors:  Severity:  Timing: Context:   Objective  Well appearing patient in no apparent distress; mood and affect are within normal limits. Objective  Left Hand: Scar smooth healed well  Objective  Left Anterior Neck: Subtle maxillary 1cm pink crust         All skin waist up examined.   Assessment & Plan    History of basal cell carcinoma (BCC) Left Hand  Yearly skin check  Neoplasm of uncertain behavior of skin Left Anterior Neck  Skin / nail biopsy Type of biopsy: tangential   Informed consent: discussed and consent obtained   Timeout: patient name, date of birth, surgical site, and procedure verified   Procedure prep:  Patient was prepped and draped in usual sterile fashion (Non sterile) Prep type:  Chlorhexidine Anesthesia: the lesion was anesthetized in a standard fashion   Anesthetic:  1% lidocaine w/ epinephrine 1-100,000 local infiltration Instrument used: flexible razor blade   Hemostasis achieved with: ferric subsulfate   Outcome: patient tolerated procedure well   Post-procedure details: sterile dressing applied and wound care instructions given   Dressing type: bandage and petrolatum    Specimen 1 - Surgical pathology Differential Diagnosis: R/O BCC vs SCC  Check Margins: No      I, Lavonna Monarch, MD, have reviewed all documentation for this visit.  The documentation on 03/08/20 for the exam, diagnosis, procedures, and orders are all accurate and complete.

## 2020-03-20 ENCOUNTER — Telehealth: Payer: Self-pay | Admitting: Pharmacist

## 2020-03-20 NOTE — Telephone Encounter (Signed)
Called pt to obtain updated prescription drug insurance information so that prior authorization can be submitted for Eliquis.  ID [id number] BIN [id number] PCN [id number] GRP [id number]  Prior authorization has been submitted.

## 2020-03-26 NOTE — Telephone Encounter (Signed)
Eliquis appeals has been overturned and approved through 03/25/21. Called pt to make him aware.

## 2020-04-05 ENCOUNTER — Other Ambulatory Visit (HOSPITAL_COMMUNITY): Payer: Self-pay | Admitting: Physician Assistant

## 2020-04-08 ENCOUNTER — Other Ambulatory Visit (HOSPITAL_COMMUNITY): Payer: Self-pay | Admitting: Physician Assistant

## 2020-04-09 ENCOUNTER — Other Ambulatory Visit (HOSPITAL_COMMUNITY): Payer: Self-pay | Admitting: Physician Assistant

## 2020-04-09 ENCOUNTER — Encounter: Payer: Self-pay | Admitting: Cardiovascular Disease

## 2020-04-09 NOTE — Progress Notes (Signed)
Ark Agrusa Date of Birth  [birth year] [facility] [address]    [place], [place]  [address] [phone]   Problem List 1. Hx of chest pain: Normal cath in 2002 [name])  A single plane ventriculogram revealed normal wall motion, ejection fraction  of approximately 65%. There was no gradient noted on pullback.  Fluoroscopy revealed no significant calcification.  Coronary angiography:  1. The left main coronary artery bifurcated into the left anterior descending  and circumflex vessel. There was no significant disease in the left main  coronary artery.  2. Left anterior descending: The left anterior descending artery gave rise to  a moderate sized D-1, a small D-2, and went on to end as an apical  recurrent branch. There was no significant disease in the left anterior  descending or its branches.  3. Circumflex vessel: The circumflex vessel was a large caliber vessel that  gave rise to a large branching obtuse marginal, then went on to end as an  AV groove vessel.  4. Right coronary artery: The right coronary artery was dominant for the  posterior circulation. The right coronary artery gave rise to two small  ______ marginals, a moderate sized PDA, and two large PL branches. There  was no significant disease in the right coronary artery or its branches.  2. Paroxysmal atrial fib 3. Essential HTN   Malcomb is a 70 year old gentleman with a history of hypertension and hyperlipidemia. He's done very well since I last saw him last year. He still cycling on a regular basis. He denies any episodes of syncope or presyncope.  Oct. 14, 2014:  Ardie was seen in the ER several weeks ago for chest pain.   On Sept. 24 - he awoke with CP.  He reports having some leg cramping earlier that night.  He has been lifting weights and does not think it was MSK.   The pain was mid sternal, radiated outward,  Pulsating like CP.  The pain lasted for several hours and  he went to the ED.  The pain resolved with Toradol.   He had some residual CP for then next several days.  He took motrin - the discomfort was gone after several days.    He has esopageal pain with  Eating dry rice, dry chicken.   Oct. 25, 2017:  Rondo is doing well BP is elevated.  Saw [name] several months ago.  Irbesartan was doubled.  His sleep study was negative.  Is not taking the Kdur - thinks it may be causig him gas  Had a TEE / Cardioversion in September 08, 2015:    BP is elevated .   Does not eat any salty foods.  Has slacked off on his cycling.   [facility]   Has a strong family hx of  HTN   Jan. 25, 2018:  Feeling well.   BP is much better on the Aldactone  Still at the [facility] regularly  Stopped taking the Kdur due to stomach ache.   January 05, 2018: Wilberth is seen back today for follow-up visit.  He is  He has been seen on numerous occasions by Sharrell Ku and more recently by Roderic Palau for persistent atrial fibrillation.  He feels much better in NSR compared to Afib  Has had 2 cardioversions The last cardioversion did not last very long  He saw Roderic Palau - she suggested [facility]   March 02, 2019:  Brigham is seen today for follow-up of his persistent Atrial fibrillation.  He has been seen in the atrial fibrillation clinic several times. Currently working from home.  ( works for [employer])  Is now on [place],  Has maintained NSR  Still exerercising .at home   Has developed a cough . Wondered if it was cardiac related.  His wife and daughter were very concerned that this might be a cardiac issue. I suggested that he start taking Pepcid Complete.  We will also try albuterol HFA as needed.  Jan. 27, 2022 Ishaaq is seen today for follow up of his atrial fib. Has been on tikosyn, Needs Mag and BMP levels tomorrow ( lab is closed today )  Feels overall pretty good.  Difficulty sleeping . - likely due to stress   Difficulty sleeping    Current Outpatient Medications on File Prior to Visit  Medication Sig Dispense Refill  . albuterol (VENTOLIN HFA) 108 (90 Base) MCG/ACT inhaler Inhale 2 puffs into the lungs every 4 (four) hours as needed for wheezing or shortness of breath. 6.7 g 2  . apixaban (ELIQUIS) 5 MG TABS tablet Take 1 tablet (5 mg total) by mouth 2 (two) times daily. 60 tablet 3  . dofetilide (TIKOSYN) 250 MCG capsule Take 1 capsule (250 mcg total) by mouth 2 (two) times daily. appt required for refills [phone] 60 capsule 1  . fluticasone (FLONASE) 50 MCG/ACT nasal spray Place 2 sprays into both nostrils every evening.     . metoprolol succinate (TOPROL-XL) 100 MG 24 hr tablet Take 1 tablet (100 mg total) by mouth daily. Take with or immediately following a meal. 90 tablet 2  . montelukast (SINGULAIR) 10 MG tablet Take 10 mg by mouth at bedtime.    . pantoprazole (PROTONIX) 40 MG tablet Take 40 mg by mouth daily.    . potassium chloride (KLOR-CON) 10 MEQ tablet TAKE 1 TABLET BY MOUTH EVERY DAY 90 tablet 1  . Probiotic Product (PROBIOTIC DAILY PO) Take 1 capsule by mouth daily.    . rosuvastatin (CRESTOR) 5 MG tablet Take 5 mg by mouth daily.    . tadalafil (CIALIS) 20 MG tablet Take 1 tablet (20 mg total) by mouth daily as needed. 15 tablet 5   No current facility-administered medications on file prior to visit.    Allergies  Allergen Reactions  . Losartan Other (See Comments)    Makes BP drop very low  . Penicillins Other (See Comments)    Childhood reaction Has patient had a PCN reaction causing immediate rash, facial/tongue/throat swelling, SOB or lightheadedness with hypotension: yes Has patient had a PCN reaction causing severe rash involving mucus membranes or skin necrosis: no Has patient had a PCN reaction that required hospitalization: no Has patient had a PCN reaction occurring within the last 10 years: no If all of the above answers are "NO", then may proceed with  Cephalosporin use.     Past Medical History:  Diagnosis Date  . Aortic root dilation (HCC)    a. mild by CT 10/2017.  [name] Basal cell carcinoma 10/02/2019   left hand bcc nod.  . Cardiomyopathy ([place])   . Central retinal vein occlusion   . Chronic systolic CHF (congestive heart failure) ([place])   . ED (erectile dysfunction)   . H/O cardiac catheterization 2002 - normal cors  . HTN (hypertension)   . Hyperlipidemia   . Hypokalemia   . Leg edema   . LV dysfunction  a. EF appeared mildly depressed by TEE 08/2015, no % given.  . Mini stroke [facility])    a. ? Age 35 at time of central retinal vein occlusion - lost vision in eye  . PAF (paroxysmal atrial fibrillation) ([place])    a. Dx 06/2015, s/p TEE/DCCV 08/2015.  [name] Pericarditis 2010  . Syncope    Hx of thought to be due to orthostatic hypotension  . Visit for monitoring Tikosyn therapy 01/17/2018    Past Surgical History:  Procedure Laterality Date  . CARDIAC CATHETERIZATION  2002  . CARDIOVERSION N/A 09/08/2015   Procedure: CARDIOVERSION;  Surgeon: Fay Records, MD;  Location: [facility];  Service: Cardiovascular;  Laterality: N/A;  . CARDIOVERSION N/A 06/28/2017   Procedure: CARDIOVERSION;  Surgeon: Jerline Pain, MD;  Location[facility] ENDOSCOPY;  Service: Cardiovascular;  Laterality: N/A;  . HAND SURGERY  2003  . KNEE SURGERY    . NASAL SINUS SURGERY  2003   x2. Scar tissue build up and had to recreate his tear duct  . PERICARDIECTOMY    . TEE WITHOUT CARDIOVERSION N/A 09/08/2015   Procedure: TRANSESOPHAGEAL ECHOCARDIOGRAM (TEE);  Surgeon: Fay Records, MD;  Location: [facility] ENDOSCOPY;  Service: Cardiovascular;  Laterality: N/A;  . TEE WITHOUT CARDIOVERSION N/A 06/28/2017   Procedure: TRANSESOPHAGEAL ECHOCARDIOGRAM (TEE);  Surgeon: Jerline Pain, MD;  Location: [facility] ENDOSCOPY;  Service: Cardiovascular;  Laterality: N/A;    Social History   Tobacco Use  Smoking Status Never Smoker  Smokeless Tobacco Never Used    Social History    Substance and Sexual Activity  Alcohol Use Not Currently   Comment: 2-3 Times a Week Wine with Meals    Family History  Problem Relation Age of Onset  . Hypertension Mother   . Diabetes Mother   . Cancer Mother   . Atrial fibrillation Father   . Heart disease Father        had pacemaker  . Hypertension Father   . Diabetes Father     Reviw of Systems:  Reviewed in the HPI.  All other systems are negative.  Physical Exam: Blood pressure 116/86, pulse 60, height 6\' 1"  (1.854 m), weight 201 lb (91.2 kg), SpO2 97 %.  GEN:  Well nourished, well developed in no acute distress HEENT: Normal NECK: No JVD; No carotid bruits LYMPHATICS: No lymphadenopathy CARDIAC: RRR , no murmurs, rubs, gallops RESPIRATORY:  Clear to auscultation without rales, wheezing or rhonchi  ABDOMEN: Soft, non-tender, non-distended MUSCULOSKELETAL:  No edema; No deformity  SKIN: Warm and dry NEUROLOGIC:  Alert and oriented x 3    ECG: April 10, 2020: Sinus bradycardia 56.  No ST or T wave changes.  QT interval is normal.   Assessment / Plan:   1. Paroxysmal atrial fibrillation:   Cont Tikosyn.    ECG today  MAg and BMP tomorrow  Will see Melina Copa, PA in 6 months for ECG, Mag, BMP  i'll see him in a year     2. Essential hypertension:  BP is well controlled.    3.  Chronic systolic congestive heart failure:  No symptoms .      Mertie Moores, MD  04/10/2020 5:26 PM    [address],  [place], [place]  [address] Pager [phone] Phone: [phone]; Fax: [phone]

## 2020-04-10 ENCOUNTER — Encounter: Payer: Self-pay | Admitting: Cardiovascular Disease

## 2020-04-10 ENCOUNTER — Other Ambulatory Visit: Payer: Self-pay

## 2020-04-10 ENCOUNTER — Ambulatory Visit: Payer: 59 | Admitting: Cardiovascular Disease

## 2020-04-10 VITALS — BP 116/86 | HR 60 | Ht 73.0 in | Wt 201.0 lb

## 2020-04-10 DIAGNOSIS — I4819 Other persistent atrial fibrillation: Secondary | ICD-10-CM

## 2020-04-10 NOTE — Patient Instructions (Signed)
Medication Instructions:  Your physician recommends that you continue on your current medications as directed. Please refer to the Current Medication list given to you today.  *If you need a refill on your cardiac medications before your next appointment, please call your pharmacy*   Lab Work: BMET and Magnesium   If you have labs (blood work) drawn today and your tests are completely normal, you will receive your results only by: [name] MyChart Message (if you have MyChart) OR . A paper copy in the mail If you have any lab test that is abnormal or we need to change your treatment, we will call you to review the results.   Testing/Procedures: None   Follow-Up: At [facility], you and your health needs are our priority.  As part of our continuing mission to provide you with exceptional heart care, we have created designated Provider Care Teams.  These Care Teams include your primary Cardiologist (physician) and Advanced Practice Providers (APPs -  Physician Assistants and Nurse Practitioners) who all work together to provide you with the care you need, when you need it.  We recommend signing up for the patient portal called "MyChart".  Sign up information is provided on this After Visit Summary.  MyChart is used to connect with patients for Virtual Visits (Telemedicine).  Patients are able to view lab/test results, encounter notes, upcoming appointments, etc.  Non-urgent messages can be sent to your provider as well.   To learn more about what you can do with MyChart, go to [email or web address].    Your next appointment:   6 month(s)  The format for your next appointment:   In Person  Provider:   Melina Copa, PA-C   Other Instructions

## 2020-04-11 ENCOUNTER — Other Ambulatory Visit: Payer: 59

## 2020-04-16 ENCOUNTER — Other Ambulatory Visit: Payer: Self-pay

## 2020-04-16 ENCOUNTER — Other Ambulatory Visit: Payer: 59 | Admitting: *Deleted

## 2020-04-16 DIAGNOSIS — I4819 Other persistent atrial fibrillation: Secondary | ICD-10-CM

## 2020-04-16 LAB — MAGNESIUM: Magnesium: 1.9 mg/dL (ref 1.6–2.3)

## 2020-04-16 LAB — BASIC METABOLIC PANEL
BUN/Creatinine Ratio: 18 (ref 10–24)
BUN: 17 mg/dL (ref 8–27)
CO2: 27 mmol/L (ref 20–29)
Calcium: 9 mg/dL (ref 8.6–10.2)
Chloride: 102 mmol/L (ref 96–106)
Creatinine, Ser: 0.96 mg/dL (ref 0.76–1.27)
GFR calc Af Amer: 94 mL/min/{1.73_m2} (ref >59)
GFR calc non Af Amer: 81 mL/min/{1.73_m2} (ref >59)
Glucose: 94 mg/dL (ref 65–99)
Potassium: 4.1 mmol/L (ref 3.5–5.2)
Sodium: 140 mmol/L (ref 134–144)

## 2020-04-17 ENCOUNTER — Telehealth: Payer: Self-pay

## 2020-04-17 DIAGNOSIS — I10 Essential (primary) hypertension: Secondary | ICD-10-CM

## 2020-04-17 DIAGNOSIS — I480 Paroxysmal atrial fibrillation: Secondary | ICD-10-CM

## 2020-04-17 MED ORDER — MAGNESIUM OXIDE 400 MG PO CAPS: 400.0000 mg | ORAL_CAPSULE | Freq: Every day | ORAL | 3 refills | Status: DC

## 2020-04-17 NOTE — Telephone Encounter (Signed)
The patient has been notified of the result and verbalized understanding.  All questions (if any) were answered. Antonieta Iba, RN 04/17/2020 8:58 AM  Patient will start taking mag ox 400 mg daily. He will repeat labs on 2/18

## 2020-04-17 NOTE — Telephone Encounter (Signed)
-----   Message from Thayer Headings, MD sent at 04/17/2020  7:47 AM EST ----- Magnesium is slighly low.   We prefer a mag level of > 2.0 when patient is on Tikosyn.  please have him start Mag ox 400 mg a day ( OTC) Recheck BMP and mag level in 2 weeks.

## 2020-04-24 ENCOUNTER — Encounter: Payer: 59 | Admitting: Dermatology

## 2020-05-01 ENCOUNTER — Other Ambulatory Visit (HOSPITAL_COMMUNITY): Payer: Self-pay | Admitting: Physician Assistant

## 2020-05-02 ENCOUNTER — Other Ambulatory Visit: Payer: Self-pay

## 2020-05-02 ENCOUNTER — Other Ambulatory Visit: Payer: 59 | Admitting: *Deleted

## 2020-05-02 DIAGNOSIS — I10 Essential (primary) hypertension: Secondary | ICD-10-CM

## 2020-05-02 DIAGNOSIS — I480 Paroxysmal atrial fibrillation: Secondary | ICD-10-CM

## 2020-05-02 LAB — BASIC METABOLIC PANEL
BUN/Creatinine Ratio: 16 (ref 10–24)
BUN: 16 mg/dL (ref 8–27)
CO2: 24 mmol/L (ref 20–29)
Calcium: 9.1 mg/dL (ref 8.6–10.2)
Chloride: 103 mmol/L (ref 96–106)
Creatinine, Ser: 1.01 mg/dL (ref 0.76–1.27)
GFR calc Af Amer: 89 mL/min/{1.73_m2} (ref >59)
GFR calc non Af Amer: 77 mL/min/{1.73_m2} (ref >59)
Glucose: 95 mg/dL (ref 65–99)
Potassium: 4.2 mmol/L (ref 3.5–5.2)
Sodium: 140 mmol/L (ref 134–144)

## 2020-05-02 LAB — MAGNESIUM: Magnesium: 1.9 mg/dL (ref 1.6–2.3)

## 2020-05-05 ENCOUNTER — Other Ambulatory Visit: Payer: Self-pay | Admitting: *Deleted

## 2020-05-05 MED ORDER — MAGNESIUM OXIDE 400 MG PO CAPS: 400.0000 mg | ORAL_CAPSULE | Freq: Two times a day (BID) | ORAL | 3 refills | Status: DC

## 2020-05-13 ENCOUNTER — Telehealth: Payer: Self-pay | Admitting: Cardiovascular Disease

## 2020-05-13 DIAGNOSIS — R790 Abnormal level of blood mineral: Secondary | ICD-10-CM

## 2020-05-13 NOTE — Telephone Encounter (Signed)
Patient needed to know if he needs to make an appointment to have follow up labs drawn to re-check his Magnesium. Patient does not have lab orders in yet or an appointment scheduled. Please advise

## 2020-05-13 NOTE — Telephone Encounter (Signed)
Pt scheduled to come into office next week for follow up Magnesium level, pt agreeable to plan. Lab ordered placed in epic

## 2020-05-23 ENCOUNTER — Other Ambulatory Visit: Payer: Self-pay

## 2020-05-23 ENCOUNTER — Other Ambulatory Visit: Payer: 59 | Admitting: *Deleted

## 2020-05-23 DIAGNOSIS — R790 Abnormal level of blood mineral: Secondary | ICD-10-CM

## 2020-05-23 LAB — MAGNESIUM: Magnesium: 2.1 mg/dL (ref 1.6–2.3)

## 2020-07-05 ENCOUNTER — Other Ambulatory Visit: Payer: Self-pay | Admitting: Cardiovascular Disease

## 2020-07-07 NOTE — Telephone Encounter (Signed)
Pt's age 71, wt 91.2 kg, SCr 1.01, CrCl 91.55, last ov w/ PN 04/10/20.

## 2020-08-19 ENCOUNTER — Encounter (HOSPITAL_COMMUNITY): Payer: Self-pay

## 2020-08-19 ENCOUNTER — Inpatient Hospital Stay (HOSPITAL_COMMUNITY)
Admission: EM | Admit: 2020-08-19 | Discharge: 2020-08-22 | DRG: 813 | Disposition: A | Discharge status: Home / Self Care | Payer: 59 | Attending: Family Medicine | Admitting: Family Medicine

## 2020-08-19 ENCOUNTER — Emergency Department (HOSPITAL_COMMUNITY): Payer: 59

## 2020-08-19 ENCOUNTER — Ambulatory Visit (HOSPITAL_COMMUNITY): Payer: 59 | Admitting: Physician Assistant

## 2020-08-19 DIAGNOSIS — Z888 Allergy status to other drugs, medicaments and biological substances status: Secondary | ICD-10-CM | POA: Diagnosis not present

## 2020-08-19 DIAGNOSIS — I480 Paroxysmal atrial fibrillation: Secondary | ICD-10-CM | POA: Diagnosis present

## 2020-08-19 DIAGNOSIS — Z809 Family history of malignant neoplasm, unspecified: Secondary | ICD-10-CM

## 2020-08-19 DIAGNOSIS — N179 Acute kidney failure, unspecified: Secondary | ICD-10-CM | POA: Diagnosis present

## 2020-08-19 DIAGNOSIS — I5042 Chronic combined systolic (congestive) and diastolic (congestive) heart failure: Secondary | ICD-10-CM | POA: Diagnosis present

## 2020-08-19 DIAGNOSIS — R55 Syncope and collapse: Secondary | ICD-10-CM

## 2020-08-19 DIAGNOSIS — S37011A Minor contusion of right kidney, initial encounter: Secondary | ICD-10-CM | POA: Diagnosis not present

## 2020-08-19 DIAGNOSIS — I2510 Atherosclerotic heart disease of native coronary artery without angina pectoris: Secondary | ICD-10-CM | POA: Diagnosis present

## 2020-08-19 DIAGNOSIS — T45515A Adverse effect of anticoagulants, initial encounter: Secondary | ICD-10-CM | POA: Diagnosis present

## 2020-08-19 DIAGNOSIS — Z20822 Contact with and (suspected) exposure to covid-19: Secondary | ICD-10-CM | POA: Diagnosis present

## 2020-08-19 DIAGNOSIS — R571 Hypovolemic shock: Secondary | ICD-10-CM | POA: Diagnosis not present

## 2020-08-19 DIAGNOSIS — I110 Hypertensive heart disease with heart failure: Secondary | ICD-10-CM | POA: Diagnosis present

## 2020-08-19 DIAGNOSIS — K661 Hemoperitoneum: Secondary | ICD-10-CM | POA: Diagnosis not present

## 2020-08-19 DIAGNOSIS — D62 Acute posthemorrhagic anemia: Secondary | ICD-10-CM | POA: Diagnosis present

## 2020-08-19 DIAGNOSIS — Z79899 Other long term (current) drug therapy: Secondary | ICD-10-CM

## 2020-08-19 DIAGNOSIS — Z880 Allergy status to penicillin: Secondary | ICD-10-CM | POA: Diagnosis not present

## 2020-08-19 DIAGNOSIS — Z7901 Long term (current) use of anticoagulants: Secondary | ICD-10-CM | POA: Diagnosis not present

## 2020-08-19 DIAGNOSIS — Z8249 Family history of ischemic heart disease and other diseases of the circulatory system: Secondary | ICD-10-CM | POA: Diagnosis not present

## 2020-08-19 DIAGNOSIS — I5022 Chronic systolic (congestive) heart failure: Secondary | ICD-10-CM | POA: Diagnosis not present

## 2020-08-19 DIAGNOSIS — E785 Hyperlipidemia, unspecified: Secondary | ICD-10-CM | POA: Diagnosis present

## 2020-08-19 DIAGNOSIS — Z85828 Personal history of other malignant neoplasm of skin: Secondary | ICD-10-CM

## 2020-08-19 DIAGNOSIS — I429 Cardiomyopathy, unspecified: Secondary | ICD-10-CM | POA: Diagnosis present

## 2020-08-19 DIAGNOSIS — D6832 Hemorrhagic disorder due to extrinsic circulating anticoagulants: Principal | ICD-10-CM | POA: Diagnosis present

## 2020-08-19 DIAGNOSIS — Z8673 Personal history of transient ischemic attack (TIA), and cerebral infarction without residual deficits: Secondary | ICD-10-CM | POA: Diagnosis not present

## 2020-08-19 DIAGNOSIS — Z833 Family history of diabetes mellitus: Secondary | ICD-10-CM

## 2020-08-19 DIAGNOSIS — N059 Unspecified nephritic syndrome with unspecified morphologic changes: Secondary | ICD-10-CM

## 2020-08-19 DIAGNOSIS — R58 Hemorrhage, not elsewhere classified: Secondary | ICD-10-CM | POA: Diagnosis present

## 2020-08-19 LAB — HEMOGLOBIN AND HEMATOCRIT, BLOOD
HCT: 40.6 % (ref 39.0–52.0)
HCT: 40.8 % (ref 39.0–52.0)
Hemoglobin: 12.9 g/dL — ABNORMAL LOW (ref 13.0–17.0)
Hemoglobin: 12.9 g/dL — ABNORMAL LOW (ref 13.0–17.0)

## 2020-08-19 LAB — CBC WITH DIFFERENTIAL/PLATELET
Abs Immature Granulocytes: 0.05 10*3/uL (ref 0.00–0.07)
Basophils Absolute: 0.1 10*3/uL (ref 0.0–0.1)
Basophils Relative: 1 %
Eosinophils Absolute: 0.1 10*3/uL (ref 0.0–0.5)
Eosinophils Relative: 1 %
HCT: 45 % (ref 39.0–52.0)
Hemoglobin: 14.7 g/dL (ref 13.0–17.0)
Immature Granulocytes: 1 %
Lymphocytes Relative: 15 %
Lymphs Abs: 1.3 10*3/uL (ref 0.7–4.0)
MCH: 30.4 pg (ref 26.0–34.0)
MCHC: 32.7 g/dL (ref 30.0–36.0)
MCV: 93.2 fL (ref 80.0–100.0)
Monocytes Absolute: 0.4 10*3/uL (ref 0.1–1.0)
Monocytes Relative: 4 %
Neutro Abs: 7.4 10*3/uL (ref 1.7–7.7)
Neutrophils Relative %: 78 %
Platelets: 195 10*3/uL (ref 150–400)
RBC: 4.83 MIL/uL (ref 4.22–5.81)
RDW: 13 % (ref 11.5–15.5)
WBC: 9.2 10*3/uL (ref 4.0–10.5)
nRBC: 0 % (ref 0.0–0.2)

## 2020-08-19 LAB — ABO/RH: ABO/RH(D): A POS

## 2020-08-19 LAB — URINALYSIS, ROUTINE W REFLEX MICROSCOPIC
Bilirubin Urine: NEGATIVE
Glucose, UA: NEGATIVE mg/dL
Ketones, ur: 20 mg/dL — AB
Leukocytes,Ua: NEGATIVE
Nitrite: NEGATIVE
Protein, ur: 100 mg/dL — AB
Specific Gravity, Urine: >1.046 — ABNORMAL HIGH (ref 1.005–1.030)
pH: 5 (ref 5.0–8.0)

## 2020-08-19 LAB — COMPREHENSIVE METABOLIC PANEL
ALT: 21 U/L (ref 0–44)
AST: 21 U/L (ref 15–41)
Albumin: 3.2 g/dL — ABNORMAL LOW (ref 3.5–5.0)
Alkaline Phosphatase: 42 U/L (ref 38–126)
Anion gap: 6 (ref 5–15)
BUN: 16 mg/dL (ref 8–23)
CO2: 23 mmol/L (ref 22–32)
Calcium: 8.3 mg/dL — ABNORMAL LOW (ref 8.9–10.3)
Chloride: 108 mmol/L (ref 98–111)
Creatinine, Ser: 1.29 mg/dL — ABNORMAL HIGH (ref 0.61–1.24)
GFR, Estimated: >60 mL/min (ref >60)
Glucose, Bld: 171 mg/dL — ABNORMAL HIGH (ref 70–99)
Potassium: 4.1 mmol/L (ref 3.5–5.1)
Sodium: 137 mmol/L (ref 135–145)
Total Bilirubin: 1 mg/dL (ref 0.3–1.2)
Total Protein: 5.4 g/dL — ABNORMAL LOW (ref 6.5–8.1)

## 2020-08-19 LAB — LIPASE, BLOOD: Lipase: 34 U/L (ref 11–51)

## 2020-08-19 LAB — LACTIC ACID, PLASMA
Lactic Acid, Venous: 2.5 mmol/L (ref 0.5–1.9)
Lactic Acid, Venous: 2.4 mmol/L (ref 0.5–1.9)

## 2020-08-19 LAB — ECHOCARDIOGRAM LIMITED: Weight: 2944 oz

## 2020-08-19 LAB — HIV ANTIBODY (ROUTINE TESTING W REFLEX): HIV Screen 4th Generation wRfx: NONREACTIVE

## 2020-08-19 LAB — SARS CORONAVIRUS 2 (TAT 6-24 HRS): SARS Coronavirus 2: NEGATIVE

## 2020-08-19 LAB — PROTIME-INR
INR: 1.2 (ref 0.8–1.2)
Prothrombin Time: 15 seconds (ref 11.4–15.2)

## 2020-08-19 LAB — CBG MONITORING, ED: Glucose-Capillary: 150 mg/dL — ABNORMAL HIGH (ref 70–99)

## 2020-08-19 LAB — TROPONIN I (HIGH SENSITIVITY)
Troponin I (High Sensitivity): 6 ng/L (ref <18)
Troponin I (High Sensitivity): 8 ng/L (ref <18)

## 2020-08-19 LAB — MAGNESIUM: Magnesium: 1.9 mg/dL (ref 1.7–2.4)

## 2020-08-19 MED ORDER — IOHEXOL 350 MG/ML SOLN
100.0000 mL | Freq: Once | INTRAVENOUS | Status: AC
  Administered 2020-08-19: 100 mL via INTRAVENOUS

## 2020-08-19 MED ORDER — DOFETILIDE 250 MCG PO CAPS
250.0000 ug | ORAL_CAPSULE | Freq: Two times a day (BID) | ORAL | Status: DC
  Administered 2020-08-19 – 2020-08-22 (×6): 250 ug via ORAL
  Filled 2020-08-19 (×8): qty 1

## 2020-08-19 MED ORDER — LACTATED RINGERS IV SOLN: INTRAVENOUS | Status: DC

## 2020-08-19 MED ORDER — ROSUVASTATIN CALCIUM 5 MG PO TABS
5.0000 mg | ORAL_TABLET | Freq: Every day | ORAL | Status: DC
  Administered 2020-08-19 – 2020-08-22 (×4): 5 mg via ORAL
  Filled 2020-08-19 (×4): qty 1

## 2020-08-19 MED ORDER — ALBUTEROL SULFATE (2.5 MG/3ML) 0.083% IN NEBU: 2.5000 mg | INHALATION_SOLUTION | RESPIRATORY_TRACT | Status: DC | PRN

## 2020-08-19 MED ORDER — ALBUTEROL SULFATE HFA 108 (90 BASE) MCG/ACT IN AERS: 2.0000 | INHALATION_SPRAY | RESPIRATORY_TRACT | Status: DC | PRN

## 2020-08-19 MED ORDER — PERFLUTREN LIPID MICROSPHERE
1.0000 mL | INTRAVENOUS | Status: AC | PRN
  Administered 2020-08-19: 2 mL via INTRAVENOUS
  Filled 2020-08-19: qty 10

## 2020-08-19 MED ORDER — MAGNESIUM OXIDE 400 MG PO CAPS: 400.0000 mg | ORAL_CAPSULE | Freq: Two times a day (BID) | ORAL | Status: DC

## 2020-08-19 MED ORDER — HYDROMORPHONE HCL 1 MG/ML IJ SOLN
1.0000 mg | Freq: Once | INTRAMUSCULAR | Status: AC
  Administered 2020-08-19: 1 mg via INTRAVENOUS
  Filled 2020-08-19: qty 1

## 2020-08-19 MED ORDER — SODIUM CHLORIDE 0.9 % IV SOLN: INTRAVENOUS | Status: DC

## 2020-08-19 MED ORDER — RISAQUAD PO CAPS
ORAL_CAPSULE | Freq: Every day | ORAL | Status: DC
  Administered 2020-08-21 – 2020-08-22 (×2): 1 via ORAL
  Filled 2020-08-19 (×3): qty 1

## 2020-08-19 MED ORDER — FLUTICASONE PROPIONATE 50 MCG/ACT NA SUSP
2.0000 | Freq: Every evening | NASAL | Status: DC
  Filled 2020-08-19: qty 16

## 2020-08-19 MED ORDER — HYDROMORPHONE HCL 1 MG/ML IJ SOLN
1.0000 mg | INTRAMUSCULAR | Status: DC | PRN
  Administered 2020-08-19 (×2): 1 mg via INTRAVENOUS
  Filled 2020-08-19 (×2): qty 1

## 2020-08-19 MED ORDER — PROTHROMBIN COMPLEX CONC HUMAN 500 UNITS IV KIT
4400.0000 [IU] | PACK | Status: AC
  Administered 2020-08-19: 4400 [IU] via INTRAVENOUS
  Filled 2020-08-19: qty 4400

## 2020-08-19 MED ORDER — PANTOPRAZOLE SODIUM 40 MG PO TBEC
40.0000 mg | DELAYED_RELEASE_TABLET | Freq: Every day | ORAL | Status: DC
  Administered 2020-08-19 – 2020-08-22 (×4): 40 mg via ORAL
  Filled 2020-08-19 (×4): qty 1

## 2020-08-19 MED ORDER — SODIUM CHLORIDE 0.9 % IV SOLN
500.0000 mL | Freq: Once | INTRAVENOUS | Status: AC
  Administered 2020-08-19: 500 mL via INTRAVENOUS

## 2020-08-19 MED ORDER — MAGNESIUM OXIDE -MG SUPPLEMENT 400 (240 MG) MG PO TABS
400.0000 mg | ORAL_TABLET | Freq: Two times a day (BID) | ORAL | Status: DC
  Administered 2020-08-20 – 2020-08-22 (×5): 400 mg via ORAL
  Filled 2020-08-19 (×6): qty 1

## 2020-08-19 MED ORDER — ACETAMINOPHEN 325 MG PO TABS
650.0000 mg | ORAL_TABLET | Freq: Four times a day (QID) | ORAL | Status: DC | PRN
  Administered 2020-08-21 – 2020-08-22 (×3): 650 mg via ORAL
  Filled 2020-08-19 (×3): qty 2

## 2020-08-19 MED ORDER — ONDANSETRON HCL 4 MG/2ML IJ SOLN
4.0000 mg | INTRAMUSCULAR | Status: DC | PRN
  Administered 2020-08-19: 4 mg via INTRAVENOUS
  Filled 2020-08-19: qty 2

## 2020-08-19 MED ORDER — ACETAMINOPHEN 650 MG RE SUPP: 650.0000 mg | Freq: Four times a day (QID) | RECTAL | Status: DC | PRN

## 2020-08-19 MED ORDER — ONDANSETRON HCL 4 MG/2ML IJ SOLN
4.0000 mg | Freq: Once | INTRAMUSCULAR | Status: AC
  Administered 2020-08-19: 4 mg via INTRAVENOUS
  Filled 2020-08-19: qty 2

## 2020-08-19 MED ORDER — MONTELUKAST SODIUM 10 MG PO TABS
10.0000 mg | ORAL_TABLET | Freq: Every day | ORAL | Status: DC
  Filled 2020-08-19 (×3): qty 1

## 2020-08-19 NOTE — Progress Notes (Signed)
Hb 12.9 compared to 14.7 in the morning, BP stable, continue to monitor. Next H/H reading in 6 hours.

## 2020-08-19 NOTE — ED Provider Notes (Signed)
[facility] Provider Note   CSN: [id number] Arrival date & time: 08/19/20  1004     History Chief Complaint  Patient presents with  . Loss of Consciousness    Terry [name] is a 70 y.o. male.  HPI Patient is anticoagulated on Eliquis for atrial fibrillation.  Patient is compliant with medications and took his dose this morning.  He reports he was feeling well yesterday.  He exercised and did routine weightlifting but did not do excessive amounts of straining and no injury associated.  This morning, he was getting ready to go out with his wife to get a haircut.  He developed a sudden and fairly severe pain in his right flank.  He reports it eased off so they continued on with their plan to go to the hair dresser.  Once he was in the car, he got another episode of severe flank pain and this time became nauseated pale and diaphoretic.  Patient lost consciousness.  His wife was able to arrive at the hairdresser and they tried to revive him with chest compressions and called EMS.  On EMS arrival patient was still pale and had slightly low blood pressure.  He was awake and oriented.  No focal neurologic deficits present.  Patient reports that once the pain eased off in his back, he felt much better and did not feel lightheaded or dizzy.    Past Medical History:  Diagnosis Date  . Aortic root dilation (HCC)    a. mild by CT 10/2017.  [name] Basal cell carcinoma 10/02/2019   left hand bcc nod.  . Cardiomyopathy ([place])   . Central retinal vein occlusion   . Chronic systolic CHF (congestive heart failure) ([place])   . ED (erectile dysfunction)   . H/O cardiac catheterization 2002 - normal cors  . HTN (hypertension)   . Hyperlipidemia   . Hypokalemia   . Leg edema   . LV dysfunction    a. EF appeared mildly depressed by TEE 08/2015, no % given.  . Mini stroke [facility])    a. ? Age 52 at time of central retinal vein occlusion - lost vision in eye  . PAF (paroxysmal  atrial fibrillation) ([place])    a. Dx 06/2015, s/p TEE/DCCV 08/2015.  [name] Pericarditis 2010  . Syncope    Hx of thought to be due to orthostatic hypotension  . Visit for monitoring Tikosyn therapy 01/17/2018    Patient Active Problem List   Diagnosis Date Noted  . Nephritis hemorrhagic 08/19/2020  . Allergic rhinitis 10/02/2019  . Anxiety state 10/02/2019  . Arthralgia of lower leg 10/02/2019  . Arthropathia 10/02/2019  . ED (erectile dysfunction) of organic origin 10/02/2019  . Esophageal reflux 10/02/2019  . Irregular cardiac rhythm 10/02/2019  . Pure hypercholesterolemia 10/02/2019  . Syncope and collapse 10/02/2019  . Secondary hypercoagulable state ([place]) 04/02/2019  . MVP (mitral valve prolapse) 07/28/2018  . Cardiomyopathy ([place]) 07/28/2018  . Chronic systolic CHF (congestive heart failure) ([place]) 07/28/2018  . Persistent atrial fibrillation   . Chest discomfort 12/26/2012  . Hypertension 02/16/2011  . Hyperlipidemia 02/16/2011    Past Surgical History:  Procedure Laterality Date  . CARDIAC CATHETERIZATION  2002  . CARDIOVERSION N/A 09/08/2015   Procedure: CARDIOVERSION;  Surgeon: Fay Records, MD;  Location: [facility];  Service: Cardiovascular;  Laterality: N/A;  . CARDIOVERSION N/A 06/28/2017   Procedure: CARDIOVERSION;  Surgeon: Jerline Pain, MD;  Location: [facility];  Service: Cardiovascular;  Laterality: N/A;  .  HAND SURGERY  2003  . KNEE SURGERY    . NASAL SINUS SURGERY  2003   x2. Scar tissue build up and had to recreate his tear duct  . PERICARDIECTOMY    . TEE WITHOUT CARDIOVERSION N/A 09/08/2015   Procedure: TRANSESOPHAGEAL ECHOCARDIOGRAM (TEE);  Surgeon: Fay Records, MD;  Location: [facility] ENDOSCOPY;  Service: Cardiovascular;  Laterality: N/A;  . TEE WITHOUT CARDIOVERSION N/A 06/28/2017   Procedure: TRANSESOPHAGEAL ECHOCARDIOGRAM (TEE);  Surgeon: Jerline Pain, MD;  Location: [facility] ENDOSCOPY;  Service: Cardiovascular;  Laterality: N/A;       Family History   Problem Relation Age of Onset  . Hypertension Mother   . Diabetes Mother   . Cancer Mother   . Atrial fibrillation Father   . Heart disease Father        had pacemaker  . Hypertension Father   . Diabetes Father     Social History   Tobacco Use  . Smoking status: Never Smoker  . Smokeless tobacco: Never Used  Vaping Use  . Vaping Use: Never used  Substance Use Topics  . Alcohol use: Not Currently    Comment: 2-3 Times a Week Wine with Meals  . Drug use: Never    Home Medications Prior to Admission medications   Medication Sig Start Date End Date Taking? Authorizing Provider  albuterol (VENTOLIN HFA) 108 (90 Base) MCG/ACT inhaler Inhale 2 puffs into the lungs every 4 (four) hours as needed for wheezing or shortness of breath. 03/02/19   Nahser, Wonda Cheng, MD  dofetilide (TIKOSYN) 250 MCG capsule Take 1 capsule (250 mcg total) by mouth 2 (two) times daily. 05/01/20   Nahser, Wonda Cheng, MD  ELIQUIS 5 MG TABS tablet TAKE 1 TABLET BY MOUTH TWICE A DAY 07/07/20   Nahser, Wonda Cheng, MD  fluticasone [facility]) 50 MCG/ACT nasal spray Place 2 sprays into both nostrils every evening.  04/12/12   [provider]  Magnesium Oxide 400 MG CAPS Take 1 capsule (400 mg total) by mouth in the morning and at bedtime. 05/05/20   Nahser, Wonda Cheng, MD  metoprolol succinate (TOPROL-XL) 100 MG 24 hr tablet Take 1 tablet (100 mg total) by mouth daily. Take with or immediately following a meal. 01/01/20   Nahser, Wonda Cheng, MD  montelukast (SINGULAIR) 10 MG tablet Take 10 mg by mouth at bedtime.    [provider]  pantoprazole (PROTONIX) 40 MG tablet Take 40 mg by mouth daily. 05/23/19   [provider]  potassium chloride (KLOR-CON) 10 MEQ tablet TAKE 1 TABLET BY MOUTH EVERY DAY 04/09/20   Nahser, Wonda Cheng, MD  Probiotic Product (PROBIOTIC DAILY PO) Take 1 capsule by mouth daily.    [provider]  rosuvastatin (CRESTOR) 5 MG tablet Take 5 mg by mouth daily. 07/31/15   [provider]  tadalafil (CIALIS) 20 MG tablet Take 1 tablet (20 mg total) by mouth daily as needed. 02/16/11   Nahser, Wonda Cheng, MD    Allergies    Losartan and Penicillins  Review of Systems   Review of Systems 10 systems reviewed and negative except as per HPI Physical Exam Updated Vital Signs BP 117/62   Pulse (!) 59   Temp 97.9 F (36.6 C) (Oral)   Resp 14   Wt 83.5 kg   SpO2 96%   BMI 24.28 kg/m   Physical Exam Constitutional:      Comments: Patient is alert.  Mental status clear.  No respiratory distress.  Color  good.  HENT:     Head: Normocephalic and atraumatic.     Mouth/Throat:     Mouth: Mucous membranes are moist.     Pharynx: Oropharynx is clear.  Eyes:     Extraocular Movements: Extraocular movements intact.     Conjunctiva/sclera: Conjunctivae normal.     Pupils: Pupils are equal, round, and reactive to light.  Cardiovascular:     Rate and Rhythm: Normal rate and regular rhythm.     Pulses: Normal pulses.     Heart sounds: Normal heart sounds.  Pulmonary:     Effort: Pulmonary effort is normal.     Breath sounds: Normal breath sounds.  Abdominal:     General: There is no distension.     Palpations: Abdomen is soft.     Tenderness: There is no abdominal tenderness. There is no guarding.     Comments: Patient does have a focus of reproducible discomfort in the right CVA region.no rash present no soft tissue changes.  Femoral pulses are 2+ and symmetric.  Pedal pulses are 2+ and symmetric.  Musculoskeletal:        General: No swelling or tenderness. Normal range of motion.     Right lower leg: No edema.     Left lower leg: No edema.  Skin:    General: Skin is warm and dry.  Neurological:     General: No focal deficit present.     Mental Status: He is oriented to person, place, and time.     Motor: No weakness.     Coordination: Coordination normal.  Psychiatric:        Mood and Affect: Mood normal.     ED Results / Procedures / Treatments    Labs (all labs ordered are listed, but only abnormal results are displayed) Labs Reviewed  COMPREHENSIVE METABOLIC PANEL - Abnormal; Notable for the following components:      Result Value   Glucose, Bld 171 (*)    Creatinine, Ser 1.29 (*)    Calcium 8.3 (*)    Total Protein 5.4 (*)    Albumin 3.2 (*)    All other components within normal limits  LACTIC ACID, PLASMA - Abnormal; Notable for the following components:   Lactic Acid, Venous 2.5 (*)    All other components within normal limits  LACTIC ACID, PLASMA - Abnormal; Notable for the following components:   Lactic Acid, Venous 2.4 (*)    All other components within normal limits  HEMOGLOBIN AND HEMATOCRIT, BLOOD - Abnormal; Notable for the following components:   Hemoglobin 12.9 (*)    All other components within normal limits  CBG MONITORING, ED - Abnormal; Notable for the following components:   Glucose-Capillary 150 (*)    All other components within normal limits  LIPASE, BLOOD  CBC WITH DIFFERENTIAL/PLATELET  PROTIME-INR  URINALYSIS, ROUTINE W REFLEX MICROSCOPIC  MAGNESIUM  HEMOGLOBIN AND HEMATOCRIT, BLOOD  HEMOGLOBIN AND HEMATOCRIT, BLOOD  TYPE AND SCREEN  ABO/RH  TROPONIN I (HIGH SENSITIVITY)  TROPONIN I (HIGH SENSITIVITY)    EKG EKG Interpretation  Date/Time:  Tuesday August 19 2020 10:59:28 EDT Ventricular Rate:  55 PR Interval:  155 QRS Duration: 94 QT Interval:  435 QTC Calculation: 416 R Axis:   17 Text Interpretation: Sinus rhythm Borderline repolarization abnormality Baseline wander in lead(s) II aVF Confirmed by [facility] IN[facility]ASKET, : ([id number]), editor Stetler, Angela ([id number]) on 08/19/2020 11:10:37 AM   Radiology CT Angio Chest/Abd/Pel for Dissection W and/or W/WO  Result Date: 08/19/2020 CLINICAL  DATA:  Acute onset right back and flank pain. EXAM: CT ANGIOGRAPHY CHEST, ABDOMEN AND PELVIS TECHNIQUE: Non-contrast CT of the chest was initially obtained. Multidetector CT imaging through the chest, abdomen and  pelvis was performed using the standard protocol during bolus administration of intravenous contrast. Multiplanar reconstructed images and MIPs were obtained and reviewed to evaluate the vascular anatomy. CONTRAST:  119mL OMNIPAQUE IOHEXOL 350 MG/ML SOLN COMPARISON:  None. FINDINGS: CTA CHEST FINDINGS Cardiovascular: The heart is normal in size. No pericardial effusion. There is mild tortuosity calcification of the thoracic aorta but no focal aneurysm or dissection. The branch vessels are patent. No obvious coronary artery calcifications. Mediastinum/Nodes: No mediastinal or hilar mass or adenopathy. The esophagus is grossly normal. Thyroid gland is unremarkable. Lungs/Pleura: No acute pulmonary findings. No worrisome pulmonary lesions. No pleural effusion. Musculoskeletal: The bony thorax is intact. Review of the MIP images confirms the above findings. CTA ABDOMEN AND PELVIS FINDINGS VASCULAR Aorta: The aorta demonstrates scattered atherosclerotic calcifications and mild tortuosity. No dissection or focal aneurysm. Celiac: Minimal ostial calcifications.  The vessel is widely patent. SMA: Normal Renals: There are 2 right and 2 left renal arteries. Both of the right renal arteries are quite irregular and may be somewhat beaded. Could not exclude FMD. IMA: Patent Inflow: Scattered atherosclerotic calcifications but no focal aneurysm or dissection. Veins: No significant findings. Review of the MIP images confirms the above findings. NON-VASCULAR Hepatobiliary: No hepatic lesions are identified. No intrahepatic biliary dilatation. The gallbladder is grossly normal. No common bile duct dilatation. Pancreas: No mass, inflammation or ductal dilatation. Spleen: Normal size.  No focal lesions. Adrenals/Urinary Tract: The adrenal glands are unremarkable. The left kidney is unremarkable except for parapelvic cysts. There is a large right-sided perinephric hematoma with mass effect on the right kidney. The hematoma measures  approximately 11.5 x 10.0 x 9.0 cm with volume estimated at approximately 500 cc. There is evidence of active extravasation from the midpole lower pole junction region laterally (image 125/7). I do not see an obvious mass there could be an underlying lesion that bled. No history of trauma. Patient is on Eliquis for AFib. The bladder is unremarkable. Stomach/Bowel: The stomach, duodenum, small bowel and colon are grossly normal without oral contrast. No inflammatory changes, mass lesions or obstructive findings. The appendix is normal. Lymphatic: No mesenteric or retroperitoneal mass or lymphadenopathy. Reproductive: The prostate gland and seminal vesicles are unremarkable. Other: No pelvic mass or adenopathy. No free pelvic fluid collections. No inguinal mass or adenopathy. No abdominal wall hernia or subcutaneous lesions. Musculoskeletal: No significant bony findings. Review of the MIP images confirms the above findings. IMPRESSION: 1. Large right-sided perinephric hematoma with evidence of active extravasation from the midpole lower pole junction region laterally. The hematoma measures approximately 11.5 x 10.0 x 9.0 cm with mass effect on the right kidney. No obvious underlying mass is identified. Recommend interventional radiology consultation. 2. No thoracic or abdominal aortic aneurysm or dissection. 3. Irregular and beaded appearance of the right renal arteries could not exclude FMD. There are 2 right and 2 left renal arteries. 4. No acute pulmonary findings or worrisome pulmonary lesions. 5. Aortic atherosclerosis. These results were called by telephone at the time of interpretation on 08/19/2020 at 11:53 am to provider [facility] , who verbally acknowledged these results. Electronically Signed   By: Marijo Sanes M.D.   On: 08/19/2020 11:53    Procedures Procedures  CRITICAL CARE Performed by: Charlesetta Shanks   Total critical care time: 60 minutes  Critical care time was exclusive of separately  billable procedures and treating other patients.  Critical care was necessary to treat or prevent imminent or life-threatening deterioration.  Critical care was time spent personally by me on the following activities: development of treatment plan with patient and/or surrogate as well as nursing, discussions with consultants, evaluation of patient's response to treatment, examination of patient, obtaining history from patient or surrogate, ordering and performing treatments and interventions, ordering and review of laboratory studies, ordering and review of radiographic studies, pulse oximetry and re-evaluation of patient's condition. Medications Ordered in ED Medications  lactated ringers infusion ( Intravenous New Bag/Given 08/19/20 1153)  HYDROmorphone (DILAUDID) injection 1 mg (1 mg Intravenous Given 08/19/20 1243)  ondansetron (ZOFRAN) injection 4 mg (4 mg Intravenous Given 08/19/20 1306)  iohexol (OMNIPAQUE) 350 MG/ML injection 100 mL (100 mLs Intravenous Contrast Given 08/19/20 1134)  prothrombin complex conc human (KCENTRA) IVPB 4,400 Units (0 Units Intravenous Stopped 08/19/20 1305)  HYDROmorphone (DILAUDID) injection 1 mg (1 mg Intravenous Given 08/19/20 1152)  ondansetron (ZOFRAN) injection 4 mg (4 mg Intravenous Given 08/19/20 1152)  0.9 %  sodium chloride infusion (0 mLs Intravenous Stopped 08/19/20 1248)    ED Course  I have reviewed the triage vital signs and the nursing notes.  Pertinent labs & imaging results that were available during my care of the patient were reviewed by me and considered in my medical decision making (see chart for details).  Clinical Course as of 08/19/20 1345  Tue Aug 19, 2020  1103 Patient had an episode of bradycardia and hypotension.  Patient reports that he got an episode of severe low back pain again in the right flank area.  He reports that precipitated nausea and he felt like he needed to throw up.  Subsequent to that patient became extremely pale and  diaphoretic with loss of consciousness.  I was called to the bedside and monitor showed a sinus rhythm in the 70s.  Patient's nurse reports that she had perceived loss of pulses very briefly as he was losing consciousness.  Upon entering the room, patient had appearance of seizure like activity with eyes open and deviated toward the right.  Patient appeared to be slightly tonic but no clonic activity.  He was very pale but starting to gain consciousness.  No secretions in the airway.  No gurgling of the airway.  As I entered the room, I was able to palpate radial pulses.  No oxygen applied.  Patient became more and more responsive.  Fibrillation pads applied.  1 L fluid initiated.  CBG 150.  Patient returned to baseline mental status stating he felt that he passed out due to the sudden worsening of pain.  This time I have highest suspicion for vasovagal episode in response to pain.  We will proceed to CT angiogram to rule out dissection without BUN/creatinine.  At this time, we will proceed regardless of renal function.  So we will place consult to cardiology to update as he is patient with [facility] for A. fib on Eliquis. [MP]  1139 Consult:Dr. [facility] urology. Serial H&H, Eliquis reversal, bedrest, medical admit. If transfusion nonresponsive, IR embolization.  [MP]    Clinical Course User Index [MP] Charlesetta Shanks, MD   MDM Rules/Calculators/A&P                          Patient presented as outlined.  He did have a recurrence of loss of consciousness as described in  note.  CT shows an actively extravasating right renal hematoma.  No apparent reason for spontaneous renal hematoma other than active anticoagulation with Eliquis.  Eliquis reversed with Kcentra.  Pain control with Dilaudid.  Patient syncopal episodes appear to be vasovagal in response to pain.  Cardiology has seen the patient to also help with management.  Consultations were made to nephrology, cardiology and Dr. Roosevelt Locks for admission to hospitalist  service. Final Clinical Impression(s) / ED Diagnoses Final diagnoses:  Hematoma of right kidney, initial encounter  Anticoagulated  Syncope, unspecified syncope type    Rx / DC Orders ED Discharge Orders    None       Charlesetta Shanks, MD 08/19/20 1347

## 2020-08-19 NOTE — Consult Note (Signed)
Reason for Consult: Spontaneous Right Renal Hemorrhage / Hematoma  Referring Physician: Charlesetta Shanks MD  Terry [name] is an 70 y.o. male.   HPI:   1 - Spontaneous Right Renal Hemorrhage / Hematoma- large Right perinephric hematoma by ER CT 6/7 on eval flank / abdominal pain and orthostasis. 2 artery / 1 vein (difficult to tell) right renovascular anatomy with aparant blush from lower pole lateral area. No underlying mass on this study or on [place] 2019. Started experiencing right flank pain after the gym yesterday and thought it was a muscle ache. Experienced sudden worsening of lower back/flank pain this morning associated with nausea, lightheadedness and was brought to the hospital after a syncopal episode. No recent history of trauma. No hematuria. He is on Eliquis for AFib. No known personal or family history of urologic malignancies, he has not been told of a renal mass or renal cyst in the past.     Past Medical History:  Diagnosis Date  . Aortic root dilation (HCC)    a. mild by CT 10/2017.  [name] Basal cell carcinoma 10/02/2019   left hand bcc nod.  . Cardiomyopathy ([place])   . Central retinal vein occlusion   . Chronic systolic CHF (congestive heart failure) ([place])   . ED (erectile dysfunction)   . H/O cardiac catheterization 2002 - normal cors  . HTN (hypertension)   . Hyperlipidemia   . Hypokalemia   . Leg edema   . LV dysfunction    a. EF appeared mildly depressed by TEE 08/2015, no % given.  . Mini stroke [facility])    a. ? Age 89 at time of central retinal vein occlusion - lost vision in eye  . PAF (paroxysmal atrial fibrillation) ([place])    a. Dx 06/2015, s/p TEE/DCCV 08/2015.  [name] Pericarditis 2010  . Syncope    Hx of thought to be due to orthostatic hypotension  . Visit for monitoring Tikosyn therapy 01/17/2018    Past Surgical History:  Procedure Laterality Date  . CARDIAC CATHETERIZATION  2002  . CARDIOVERSION N/A 09/08/2015   Procedure: CARDIOVERSION;  Surgeon: Fay Records, MD;  Location: [place];  Service: Cardiovascular;  Laterality: N/A;  . CARDIOVERSION N/A 06/28/2017   Procedure: CARDIOVERSION;  Surgeon: Jerline Pain, MD;  Location[facility] ENDOSCOPY;  Service: Cardiovascular;  Laterality: N/A;  . HAND SURGERY  2003  . KNEE SURGERY    . NASAL SINUS SURGERY  2003   x2. Scar tissue build up and had to recreate his tear duct  . PERICARDIECTOMY    . TEE WITHOUT CARDIOVERSION N/A 09/08/2015   Procedure: TRANSESOPHAGEAL ECHOCARDIOGRAM (TEE);  Surgeon: Fay Records, MD;  Location: [facility] ENDOSCOPY;  Service: Cardiovascular;  Laterality: N/A;  . TEE WITHOUT CARDIOVERSION N/A 06/28/2017   Procedure: TRANSESOPHAGEAL ECHOCARDIOGRAM (TEE);  Surgeon: Jerline Pain, MD;  Location: [facility] ENDOSCOPY;  Service: Cardiovascular;  Laterality: N/A;    Family History  Problem Relation Age of Onset  . Hypertension Mother   . Diabetes Mother   . Cancer Mother   . Atrial fibrillation Father   . Heart disease Father        had pacemaker  . Hypertension Father   . Diabetes Father     Social History:  reports that he has never smoked. He has never used smokeless tobacco. He reports previous alcohol use. He reports that he does not use drugs.  Allergies:  Allergies  Allergen Reactions  . Losartan Other (See Comments)    Makes  BP drop very low  . Penicillins Other (See Comments)    Childhood reaction Has patient had a PCN reaction causing immediate rash, facial/tongue/throat swelling, SOB or lightheadedness with hypotension: yes Has patient had a PCN reaction causing severe rash involving mucus membranes or skin necrosis: no Has patient had a PCN reaction that required hospitalization: no Has patient had a PCN reaction occurring within the last 10 years: no If all of the above answers are "NO", then may proceed with Cephalosporin use.     Medications: I have reviewed the patient's current medications.  Results for orders placed or performed during the hospital  encounter of 08/19/20 (from the past 48 hour(s))  Comprehensive metabolic panel     Status: Abnormal   Collection Time: 08/19/20 10:20 AM  Result Value Ref Range   Sodium 137 135 - 145 mmol/L   Potassium 4.1 3.5 - 5.1 mmol/L   Chloride 108 98 - 111 mmol/L   CO2 23 22 - 32 mmol/L   Glucose, Bld 171 (H) 70 - 99 mg/dL    Comment: Glucose reference range applies only to samples taken after fasting for at least 8 hours.   BUN 16 8 - 23 mg/dL   Creatinine, Ser 1.29 (H) 0.61 - 1.24 mg/dL   Calcium 8.3 (L) 8.9 - 10.3 mg/dL   Total Protein 5.4 (L) 6.5 - 8.1 g/dL   Albumin 3.2 (L) 3.5 - 5.0 g/dL   AST 21 15 - 41 U/L   ALT 21 0 - 44 U/L   Alkaline Phosphatase 42 38 - 126 U/L   Total Bilirubin 1.0 0.3 - 1.2 mg/dL   GFR, Estimated >60 >60 mL/min    Comment: (NOTE) Calculated using the CKD-EPI Creatinine Equation (2021)    Anion gap 6 5 - 15    Comment: Performed at [facility], [place] [address], [place], [place] [address]  Lipase, blood     Status: None   Collection Time: 08/19/20 10:20 AM  Result Value Ref Range   Lipase 34 11 - 51 U/L    Comment: Performed at [facility] [address] [place], [place] [address]  Troponin I (High Sensitivity)     Status: None   Collection Time: 08/19/20 10:20 AM  Result Value Ref Range   Troponin I (High Sensitivity) 6 <18 ng/L    Comment: (NOTE) Elevated high sensitivity troponin I (hsTnI) values and significant  changes across serial measurements may suggest ACS but many other  chronic and acute conditions are known to elevate hsTnI results.  Refer to the "Links" section for chest pain algorithms and additional  guidance. Performed at [facility], [place] [address] [place], [place] [address]   Lactic acid, plasma     Status: Abnormal   Collection Time: 08/19/20 10:20 AM  Result Value Ref Range   Lactic Acid, Venous 2.5 (HH) 0.5 - 1.9 mmol/L    Comment: CRITICAL RESULT CALLED TO, READ BACK BY AND VERIFIED WITH: DODD,J RN @  1123 08/19/20 LEONARD,A Performed at [facility], [place] [address] [place], [place] [address]   CBC with Differential     Status: None   Collection Time: 08/19/20 10:20 AM  Result Value Ref Range   WBC 9.2 4.0 - 10.5 K/uL   RBC 4.83 4.22 - 5.81 MIL/uL   Hemoglobin 14.7 13.0 - 17.0 g/dL   HCT 45.0 39.0 - 52.0 %   MCV 93.2 80.0 - 100.0 fL   MCH 30.4  26.0 - 34.0 pg   MCHC 32.7 30.0 - 36.0 g/dL   RDW 13.0 11.5 - 15.5 %   Platelets 195 150 - 400 K/uL   nRBC 0.0 0.0 - 0.2 %   Neutrophils Relative % 78 %   Neutro Abs 7.4 1.7 - 7.7 K/uL   Lymphocytes Relative 15 %   Lymphs Abs 1.3 0.7 - 4.0 K/uL   Monocytes Relative 4 %   Monocytes Absolute 0.4 0.1 - 1.0 K/uL   Eosinophils Relative 1 %   Eosinophils Absolute 0.1 0.0 - 0.5 K/uL   Basophils Relative 1 %   Basophils Absolute 0.1 0.0 - 0.1 K/uL   Immature Granulocytes 1 %   Abs Immature Granulocytes 0.05 0.00 - 0.07 K/uL    Comment: Performed at [facility] [address], [place], [place] [address]  Protime-INR     Status: None   Collection Time: 08/19/20 10:20 AM  Result Value Ref Range   Prothrombin Time 15.0 11.4 - 15.2 seconds   INR 1.2 0.8 - 1.2    Comment: (NOTE) INR goal varies based on device and disease states. Performed at [facility], [place] [address] [place], [place] [address]   CBG monitoring, ED     Status: Abnormal   Collection Time: 08/19/20 10:57 AM  Result Value Ref Range   Glucose-Capillary 150 (H) 70 - 99 mg/dL    Comment: Glucose reference range applies only to samples taken after fasting for at least 8 hours.    No results found.  Review of Systems Blood pressure 127/73, pulse 60, temperature 97.9 F (36.6 C), temperature source Oral, resp. rate 16, SpO2 98 %. Physical Exam  General:  Well nourished, well developed, pale Cardiac:  mildly tachycardic Lungs:  breathing comfortably on RA Abd: mildly tender to palpation throughout, moreso on right side Ext: no edema GU: voiding  spontaneously, right CVA tenderness Skin: warm and dry  Neuro:   no focal abnormalities noted Psych:  Normal affect   Assessment/Plan:  1 - Spontaneous Right Renal Hemorrhage / Hematoma- unusual case. Agree with reversal of anticoagulaiton, NPO, bedrest, T+C blood, serial H/H, Consider IR angiogram / Embolization if becomes transfustion non-responsive or significant anemia. No underlying mass to treat. Do NOT recommend any sort of surgery.   Will follow Call with qurestions.   Alexis Frock 08/19/2020, 11:43 AM

## 2020-08-19 NOTE — Progress Notes (Signed)
   Dr. Rayann Heman reviewed the telemetry.  He feels the episode of tachycardia is an atrial tachycardia.  Although he may have had a brief episode of A. fib, he is largely in sinus rhythm and Dr Rayann Heman feels that Tikosyn should be continued.  Terry [name] has not had any significant arrhythmias.  Based on his telemetry, no treatment change is indicated.  Dr. Rayann Heman feels that as long as the patient's magnesium is greater than 1.7, supplementation is not needed.  Okay to decrease the magnesium to once a day after holding it for a few days to see if the patient can tolerate it without significant side effects.  Rosaria Ferries, PA-C 08/19/2020 4:12 PM

## 2020-08-19 NOTE — Consult Note (Signed)
Cardiology Consultation:   Patient ID: Terry [name] MRN: [id number]; DOB: 1950-08-28  Admit date: 08/19/2020 Date of Consult: 08/19/2020  PCP:  Lawerance Cruel, MD   [facility] Providers Cardiologist:  Mertie Moores, MD  Electrophysiologist:  Thompson Grayer, MD  {     Patient Profile:   Terry [name] is a 70 y.o. male with a hx of CP w/ nl cath 2002, PAF on Tikosyn and Eliquis, HTN, S-CHF w/ EF 40% 2019 >> 50-55% 2020, TIA, syncope felt 2nd orthostatic hypotension, who is being seen 08/19/2020 for the evaluation of syncope at the request of Dr Johnney Killian.  History of Present Illness:   Terry [name] was in his usual state of health this morning.  He got up and had not eaten.  When he went to take a shower, he had sudden onset of 10/10 lower back pain.  He started feeling a little weak and a little lightheaded.  He did not fall or lose consciousness completely.  He went down the stairs in a seated position and got his wife to help him.  She put some [place] on his back and laid him down.  His symptoms persisted and they decided they needed to go to the emergency room.  When he got up to walk to the couch and then later when he got up to go to the car, he had a significant lightheaded feeling.  In the car, his pain was persisting at a 10/10.  He started feeling weak.  He was in and out of consciousness.  He remembers everything going quite, but says he could still hear.  His wife stopped the car and called for help.  At some point, he was given CPR briefly. Wife says he was in and out.  EMS arrived on the scene and said his blood pressure was 80/50, but no heart rate was mentioned, according to his wife.  Upon arrival at the hospital, his heart rate and blood pressure had normalized.  Since being in the emergency room, he has gotten lightheaded with position changes. SBP 141 >> 108 lying to sitting. His heart rate has been in the high 50s/low 60s at times, but not low enough to cause  mental status changes.  In March, when he saw Dr. Acie Fredrickson, his labs were checked and his magnesium was low at 1.9.  He was placed on prescription magnesium.  On a lab recheck, his magnesium was still not up to 2.0 and the dose was increased to 2 tablets daily.  He has a long history of loose stools and got issues.  He normally has a loose bowel movement at least 3 times a day.  When he was on the higher dose of magnesium, he had an increase in his stools..  The goal for his magnesium and potassium is 2.0 and 4.0 respectively due to being on Tikosyn.  He has not had any atrial fibrillation.  Because of his history of CHF, he studiously avoids the salt and tries not to overdrink.  The back pain is entirely new, never had before. Better after Dilaudid.    Past Medical History:  Diagnosis Date  . Aortic root dilation (HCC)    a. mild by CT 10/2017.  [name] Basal cell carcinoma 10/02/2019   left hand bcc nod.  . Cardiomyopathy ([place])   . Central retinal vein occlusion   . Chronic systolic CHF (congestive heart failure) ([place])   . ED (erectile dysfunction)   . H/O cardiac catheterization 2002 - normal  cors  . HTN (hypertension)   . Hyperlipidemia   . Hypokalemia   . Leg edema   . LV dysfunction    a. EF appeared mildly depressed by TEE 08/2015, no % given.  . Mini stroke [facility])    a. ? Age 50 at time of central retinal vein occlusion - lost vision in eye  . PAF (paroxysmal atrial fibrillation) ([place])    a. Dx 06/2015, s/p TEE/DCCV 08/2015.  [name] Pericarditis 2010  . Syncope    Hx of thought to be due to orthostatic hypotension  . Visit for monitoring Tikosyn therapy 01/17/2018    Past Surgical History:  Procedure Laterality Date  . CARDIAC CATHETERIZATION  2002  . CARDIOVERSION N/A 09/08/2015   Procedure: CARDIOVERSION;  Surgeon: Fay Records, MD;  Location: [place];  Service: Cardiovascular;  Laterality: N/A;  . CARDIOVERSION N/A 06/28/2017   Procedure: CARDIOVERSION;  Surgeon: Jerline Pain, MD;  Location: [facility] ENDOSCOPY;  Service: Cardiovascular;  Laterality: N/A;  . HAND SURGERY  2003  . KNEE SURGERY    . NASAL SINUS SURGERY  2003   x2. Scar tissue build up and had to recreate his tear duct  . PERICARDIECTOMY    . TEE WITHOUT CARDIOVERSION N/A 09/08/2015   Procedure: TRANSESOPHAGEAL ECHOCARDIOGRAM (TEE);  Surgeon: Fay Records, MD;  Location: [facility] ENDOSCOPY;  Service: Cardiovascular;  Laterality: N/A;  . TEE WITHOUT CARDIOVERSION N/A 06/28/2017   Procedure: TRANSESOPHAGEAL ECHOCARDIOGRAM (TEE);  Surgeon: Jerline Pain, MD;  Location: [facility] ENDOSCOPY;  Service: Cardiovascular;  Laterality: N/A;     Home Medications:  Prior to Admission medications   Medication Sig Start Date End Date Taking? Authorizing Provider  albuterol (VENTOLIN HFA) 108 (90 Base) MCG/ACT inhaler Inhale 2 puffs into the lungs every 4 (four) hours as needed for wheezing or shortness of breath. 03/02/19   Nahser, Wonda Cheng, MD  dofetilide (TIKOSYN) 250 MCG capsule Take 1 capsule (250 mcg total) by mouth 2 (two) times daily. 05/01/20   Nahser, Wonda Cheng, MD  ELIQUIS 5 MG TABS tablet TAKE 1 TABLET BY MOUTH TWICE A DAY 07/07/20   Nahser, Wonda Cheng, MD  fluticasone [facility]) 50 MCG/ACT nasal spray Place 2 sprays into both nostrils every evening.  04/12/12   [provider]  Magnesium Oxide 400 MG CAPS Take 1 capsule (400 mg total) by mouth in the morning and at bedtime. 05/05/20   Nahser, Wonda Cheng, MD  metoprolol succinate (TOPROL-XL) 100 MG 24 hr tablet Take 1 tablet (100 mg total) by mouth daily. Take with or immediately following a meal. 01/01/20   Nahser, Wonda Cheng, MD  montelukast (SINGULAIR) 10 MG tablet Take 10 mg by mouth at bedtime.    [provider]  pantoprazole (PROTONIX) 40 MG tablet Take 40 mg by mouth daily. 05/23/19   [provider]  potassium chloride (KLOR-CON) 10 MEQ tablet TAKE 1 TABLET BY MOUTH EVERY DAY 04/09/20   Nahser, Wonda Cheng, MD  Probiotic Product (PROBIOTIC DAILY PO)  Take 1 capsule by mouth daily.    [provider]  rosuvastatin (CRESTOR) 5 MG tablet Take 5 mg by mouth daily. 07/31/15   [provider]  tadalafil (CIALIS) 20 MG tablet Take 1 tablet (20 mg total) by mouth daily as needed. 02/16/11   Nahser, Wonda Cheng, MD    Inpatient Medications: Scheduled Meds:  Continuous Infusions: . lactated ringers 125 mL/hr at 08/19/20 1153   PRN Meds:   Allergies:    Allergies  Allergen  Reactions  . Losartan Other (See Comments)    Makes BP drop very low  . Penicillins Other (See Comments)    Childhood reaction Has patient had a PCN reaction causing immediate rash, facial/tongue/throat swelling, SOB or lightheadedness with hypotension: yes Has patient had a PCN reaction causing severe rash involving mucus membranes or skin necrosis: no Has patient had a PCN reaction that required hospitalization: no Has patient had a PCN reaction occurring within the last 10 years: no If all of the above answers are "NO", then may proceed with Cephalosporin use.     Social History:   Social History   Socioeconomic History  . Marital status: Married    Spouse name: Not on file  . Number of children: Not on file  . Years of education: Not on file  . Highest education level: Not on file  Occupational History  . Not on file  Tobacco Use  . Smoking status: Never Smoker  . Smokeless tobacco: Never Used  Vaping Use  . Vaping Use: Never used  Substance and Sexual Activity  . Alcohol use: Not Currently    Comment: 2-3 Times a Week Wine with Meals  . Drug use: Never  . Sexual activity: Not on file  Other Topics Concern  . Not on file  Social History Narrative  . Not on file   Social Determinants of Health   Financial Resource Strain: Not on file  Food Insecurity: Not on file  Transportation Needs: Not on file  Physical Activity: Not on file  Stress: Not on file  Social Connections: Not on file  Intimate Partner Violence: Not on file     Family History:   Family History  Problem Relation Age of Onset  . Hypertension Mother   . Diabetes Mother   . Cancer Mother   . Atrial fibrillation Father   . Heart disease Father        had pacemaker  . Hypertension Father   . Diabetes Father    Family Status  Relation Name Status  . Mother  Deceased       Cancer, HTN, DM  . Father  Deceased       Had a Pacemaker, HTN, DM  . MGM  Deceased  . MGF  Deceased  . PGM  Deceased  . PGF  Deceased    ROS:  Please see the history of present illness.  All other ROS reviewed and negative.     Physical Exam/Data:   Vitals:   08/19/20 1013 08/19/20 1103 08/19/20 1130 08/19/20 1159  BP:   140/76   Pulse:  60 62   Resp:  16 12   Temp: 97.9 F (36.6 C)     TempSrc: Oral     SpO2:  98% 99%   Weight:  83.5 kg  83.5 kg   No intake or output data in the 24 hours ending 08/19/20 1248 Last 3 Weights 08/19/2020 08/19/2020 04/10/2020  Weight (lbs) 184 lb 184 lb 201 lb  Weight (kg) 83.462 kg 83.462 kg 91.173 kg     Body mass index is 24.28 kg/m.  General:  Well nourished, well developed, in no acute distress when lying down.  HEENT: normal Lymph: no adenopathy Neck: no JVD Endocrine:  No thryomegaly Vascular: No carotid bruits; 4/4 extremity pulses 2+  Cardiac:  normal S1, S2; RRR; no murmur  Lungs:  clear to auscultation bilaterally, no wheezing, rhonchi or rales  Abd: soft, nontender, no hepatomegaly  Ext: no edema Musculoskeletal:  No deformities, BUE and BLE strength normal and equal Skin: warm and dry  Neuro:  CNs 2-12 intact, no focal abnormalities noted Psych:  Normal affect   EKG:  The EKG was personally reviewed and demonstrates:  SR, heart rate 59, no acute ischemic changes Telemetry:  Telemetry was personally reviewed and demonstrates: Sinus rhythm, sinus bradycardia in the high 50s.  1 episode of?  Junctional tachycardia up to 120s, very brief  Relevant CV Studies:  ECHO: 10/25/2018 1. The left ventricle has low  normal systolic function, with an ejection  fraction of 50-55%. The cavity size was normal. Left ventricular diastolic  Doppler parameters are consistent with impaired relaxation.  2. The right ventricle has normal systolic function. The cavity was  normal. There is no increase in right ventricular wall thickness.  3. Mild mitral valve prolapse.  4. The mitral valve is grossly normal. Mild thickening of the mitral  valve leaflet.  5. The aortic valve is tricuspid. Aortic valve regurgitation was not  assessed by color flow Doppler.  6. The aorta is normal unless otherwise noted.  7. The average left ventricular global longitudinal strain is -16.2 %.   Laboratory Data:  High Sensitivity Troponin:   Recent Labs  Lab 08/19/20 1020  TROPONINIHS 6     Chemistry Recent Labs  Lab 08/19/20 1020  NA 137  K 4.1  CL 108  CO2 23  GLUCOSE 171*  BUN 16  CREATININE 1.29*  CALCIUM 8.3*  GFRNONAA >60  ANIONGAP 6    Recent Labs  Lab 08/19/20 1020  PROT 5.4*  ALBUMIN 3.2*  AST 21  ALT 21  ALKPHOS 42  BILITOT 1.0   Hematology Recent Labs  Lab 08/19/20 1020  WBC 9.2  RBC 4.83  HGB 14.7  HCT 45.0  MCV 93.2  MCH 30.4  MCHC 32.7  RDW 13.0  PLT 195   BNPNo results for input(s): BNP, PROBNP in the last 168 hours.  DDimer No results for input(s): DDIMER in the last 168 hours.   Radiology/Studies:  CT Angio Chest/Abd/Pel for Dissection W and/or W/WO  Result Date: 08/19/2020 CLINICAL DATA:  Acute onset right back and flank pain. EXAM: CT ANGIOGRAPHY CHEST, ABDOMEN AND PELVIS TECHNIQUE: Non-contrast CT of the chest was initially obtained. Multidetector CT imaging through the chest, abdomen and pelvis was performed using the standard protocol during bolus administration of intravenous contrast. Multiplanar reconstructed images and MIPs were obtained and reviewed to evaluate the vascular anatomy. CONTRAST:  150mL OMNIPAQUE IOHEXOL 350 MG/ML SOLN COMPARISON:  None. FINDINGS: CTA  CHEST FINDINGS Cardiovascular: The heart is normal in size. No pericardial effusion. There is mild tortuosity calcification of the thoracic aorta but no focal aneurysm or dissection. The branch vessels are patent. No obvious coronary artery calcifications. Mediastinum/Nodes: No mediastinal or hilar mass or adenopathy. The esophagus is grossly normal. Thyroid gland is unremarkable. Lungs/Pleura: No acute pulmonary findings. No worrisome pulmonary lesions. No pleural effusion. Musculoskeletal: The bony thorax is intact. Review of the MIP images confirms the above findings. CTA ABDOMEN AND PELVIS FINDINGS VASCULAR Aorta: The aorta demonstrates scattered atherosclerotic calcifications and mild tortuosity. No dissection or focal aneurysm. Celiac: Minimal ostial calcifications.  The vessel is widely patent. SMA: Normal Renals: There are 2 right and 2 left renal arteries. Both of the right renal arteries are quite irregular and may be somewhat beaded. Could not exclude FMD. IMA: Patent Inflow: Scattered atherosclerotic calcifications but no focal aneurysm or dissection. Veins: No significant findings. Review of the MIP  images confirms the above findings. NON-VASCULAR Hepatobiliary: No hepatic lesions are identified. No intrahepatic biliary dilatation. The gallbladder is grossly normal. No common bile duct dilatation. Pancreas: No mass, inflammation or ductal dilatation. Spleen: Normal size.  No focal lesions. Adrenals/Urinary Tract: The adrenal glands are unremarkable. The left kidney is unremarkable except for parapelvic cysts. There is a large right-sided perinephric hematoma with mass effect on the right kidney. The hematoma measures approximately 11.5 x 10.0 x 9.0 cm with volume estimated at approximately 500 cc. There is evidence of active extravasation from the midpole lower pole junction region laterally (image 125/7). I do not see an obvious mass there could be an underlying lesion that bled. No history of trauma.  Patient is on Eliquis for AFib. The bladder is unremarkable. Stomach/Bowel: The stomach, duodenum, small bowel and colon are grossly normal without oral contrast. No inflammatory changes, mass lesions or obstructive findings. The appendix is normal. Lymphatic: No mesenteric or retroperitoneal mass or lymphadenopathy. Reproductive: The prostate gland and seminal vesicles are unremarkable. Other: No pelvic mass or adenopathy. No free pelvic fluid collections. No inguinal mass or adenopathy. No abdominal wall hernia or subcutaneous lesions. Musculoskeletal: No significant bony findings. Review of the MIP images confirms the above findings. IMPRESSION: 1. Large right-sided perinephric hematoma with evidence of active extravasation from the midpole lower pole junction region laterally. The hematoma measures approximately 11.5 x 10.0 x 9.0 cm with mass effect on the right kidney. No obvious underlying mass is identified. Recommend interventional radiology consultation. 2. No thoracic or abdominal aortic aneurysm or dissection. 3. Irregular and beaded appearance of the right renal arteries could not exclude FMD. There are 2 right and 2 left renal arteries. 4. No acute pulmonary findings or worrisome pulmonary lesions. 5. Aortic atherosclerosis. These results were called by telephone at the time of interpretation on 08/19/2020 at 11:53 am to provider [facility] , who verbally acknowledged these results. Electronically Signed   By: Marijo Sanes M.D.   On: 08/19/2020 11:53     Assessment and Plan:   1. Syncope -Due to the severity of his back pain, there may have been an contribution of vasovagal response to pain - Due to recent diarrhea felt secondary to medications on top of chronic GI issues, suspect a component of dehydration as well - On labs, a magnesium has not been checked, will order, K+ 4.1 - BUN/creatinine are above baseline, lending credence to dehydration as a participant - He has had no significant  bradycardia - He takes Toprol-XL 100 mg daily, raising concern for an inadequate heart rate response when he becomes lightheaded or dizzy - discuss med changes w/ MD - will give NS 500 cc bolus, for 30 pt SBP drop lying >> sitting - liberalize Na+ intake at least a little -Calcium is low, increase calcium intake as well - requested his wife ck BP/HR the next time this happens.  2. Hx Afib -Has been on Tikosyn since 2019, and Eliquis for anticoagulation -Is on potassium supplement - Magnesium supplement was started in February and then increased due to inadequate response - However, his chronic diarrhea worsened after this - Check magnesium level, hold medication for now and decide on the plan - Continue Eliquis  3.  GI issues: - He has at least 3 loose stools a day, this is genetic as his daughter complains of the same thing. - On the higher dose of magnesium, this worsened to where he was losing more fluid through his stools. - He  did not contact the office to let us know about this. - For now, hold the magnesium and see how he tolerates it at a lower dose   Risk Assessment/Risk Scores:        New York Heart Association (NYHA) Functional Class NYHA Class I  CHA2DS2-VASc Score = 3  This indicates a 3.2% annual risk of stroke. The patient's score is based upon: CHF History: Yes HTN History: Yes Diabetes History: No Stroke History: No Vascular Disease History: No Age Score: 1 Gender Score: 0      For questions or updates, please contact [place] Please consult [email or web address] for contact info under    Signed, Rosaria Ferries, PA-C  08/19/2020 12:48 PM

## 2020-08-19 NOTE — H&P (Addendum)
History and Physical    Terry [name] QMV:[id number] DOB: 05-22-1950 DOA: 08/19/2020  PCP: Lawerance Cruel, MD (Confirm with patient/family/NH records and if not entered, this has to be entered at [facility] point of entry) Patient coming from: Home  I have personally briefly reviewed patient's old medical records in [place]  Chief Complaint: Right side back pain and passed out.  HPI: Terry [name] is a 70 y.o. male with medical history significant of PAF on Tikosyn and Eliquis, HTN, HLD, chronic systolic CHF with LVEF improved from 45% to 50 to 55% in 2020, HTN, HLD, mild intermittent asthma, presented with sudden onset severe right back pain and syncope.  Patient was in shower this morning, and suddenly feel 10/10 sharp like right-sided back pain, and same time started to feel lightheadedness and about to faint.  He managed to walk out of the bathroom and sat on the chair then again felt about to pass out. And the right-sided back pain persistent 10/10.  Then family called EMS. Denies any trauma, or injuries. ED Course: ED send a CT angiogram to rule out dissection which showed There is a large right-sided perinephric hematoma with mass effect on the right kidney. The hematoma measures approximately 11.5 x 10.0 x 9.0 cm with volume estimated at approximately 500 cc.  Hb 14.5, BP in the lower 100s. No tachy (on beta-blocker last dose this morning) Review of Systems: As per HPI otherwise 14 point review of systems negative.    Past Medical History:  Diagnosis Date  . Aortic root dilation (HCC)    a. mild by CT 10/2017.  [name] Basal cell carcinoma 10/02/2019   left hand bcc nod.  . Cardiomyopathy ([place])   . Central retinal vein occlusion   . Chronic systolic CHF (congestive heart failure) ([place])   . ED (erectile dysfunction)   . H/O cardiac catheterization 2002 - normal cors  . HTN (hypertension)   . Hyperlipidemia   . Hypokalemia   . Leg edema   . LV dysfunction    a. EF appeared  mildly depressed by TEE 08/2015, no % given.  . Mini stroke [facility])    a. ? Age 66 at time of central retinal vein occlusion - lost vision in eye  . PAF (paroxysmal atrial fibrillation) ([place])    a. Dx 06/2015, s/p TEE/DCCV 08/2015.  [name] Pericarditis 2010  . Syncope    Hx of thought to be due to orthostatic hypotension  . Visit for monitoring Tikosyn therapy 01/17/2018    Past Surgical History:  Procedure Laterality Date  . CARDIAC CATHETERIZATION  2002  . CARDIOVERSION N/A 09/08/2015   Procedure: CARDIOVERSION;  Surgeon: Fay Records, MD;  Location: [place];  Service: Cardiovascular;  Laterality: N/A;  . CARDIOVERSION N/A 06/28/2017   Procedure: CARDIOVERSION;  Surgeon: Jerline Pain, MD;  Location[facility] ENDOSCOPY;  Service: Cardiovascular;  Laterality: N/A;  . HAND SURGERY  2003  . KNEE SURGERY    . NASAL SINUS SURGERY  2003   x2. Scar tissue build up and had to recreate his tear duct  . PERICARDIECTOMY    . TEE WITHOUT CARDIOVERSION N/A 09/08/2015   Procedure: TRANSESOPHAGEAL ECHOCARDIOGRAM (TEE);  Surgeon: Fay Records, MD;  Location[facility] ENDOSCOPY;  Service: Cardiovascular;  Laterality: N/A;  . TEE WITHOUT CARDIOVERSION N/A 06/28/2017   Procedure: TRANSESOPHAGEAL ECHOCARDIOGRAM (TEE);  Surgeon: Jerline Pain, MD;  Location: [facility] ENDOSCOPY;  Service: Cardiovascular;  Laterality: N/A;     reports that he has never  smoked. He has never used smokeless tobacco. He reports previous alcohol use. He reports that he does not use drugs.  Allergies  Allergen Reactions  . Losartan Other (See Comments)    Makes BP drop very low  . Penicillins Other (See Comments)    Childhood reaction Has patient had a PCN reaction causing immediate rash, facial/tongue/throat swelling, SOB or lightheadedness with hypotension: yes Has patient had a PCN reaction causing severe rash involving mucus membranes or skin necrosis: no Has patient had a PCN reaction that required hospitalization: no Has patient had a PCN  reaction occurring within the last 10 years: no If all of the above answers are "NO", then may proceed with Cephalosporin use.     Family History  Problem Relation Age of Onset  . Hypertension Mother   . Diabetes Mother   . Cancer Mother   . Atrial fibrillation Father   . Heart disease Father        had pacemaker  . Hypertension Father   . Diabetes Father      Prior to Admission medications   Medication Sig Start Date End Date Taking? Authorizing Provider  albuterol (VENTOLIN HFA) 108 (90 Base) MCG/ACT inhaler Inhale 2 puffs into the lungs every 4 (four) hours as needed for wheezing or shortness of breath. 03/02/19   Nahser, Wonda Cheng, MD  dofetilide (TIKOSYN) 250 MCG capsule Take 1 capsule (250 mcg total) by mouth 2 (two) times daily. 05/01/20   Nahser, Wonda Cheng, MD  ELIQUIS 5 MG TABS tablet TAKE 1 TABLET BY MOUTH TWICE A DAY 07/07/20   Nahser, Wonda Cheng, MD  fluticasone [facility]) 50 MCG/ACT nasal spray Place 2 sprays into both nostrils every evening.  04/12/12   [provider]  Magnesium Oxide 400 MG CAPS Take 1 capsule (400 mg total) by mouth in the morning and at bedtime. 05/05/20   Nahser, Wonda Cheng, MD  metoprolol succinate (TOPROL-XL) 100 MG 24 hr tablet Take 1 tablet (100 mg total) by mouth daily. Take with or immediately following a meal. 01/01/20   Nahser, Wonda Cheng, MD  montelukast (SINGULAIR) 10 MG tablet Take 10 mg by mouth at bedtime.    [provider]  pantoprazole (PROTONIX) 40 MG tablet Take 40 mg by mouth daily. 05/23/19   [provider]  potassium chloride (KLOR-CON) 10 MEQ tablet TAKE 1 TABLET BY MOUTH EVERY DAY 04/09/20   Nahser, Wonda Cheng, MD  Probiotic Product (PROBIOTIC DAILY PO) Take 1 capsule by mouth daily.    [provider]  rosuvastatin (CRESTOR) 5 MG tablet Take 5 mg by mouth daily. 07/31/15   [provider]  tadalafil (CIALIS) 20 MG tablet Take 1 tablet (20 mg total) by mouth daily as needed. 02/16/11   Nahser, Wonda Cheng, MD    Physical Exam: Vitals:   08/19/20 1130 08/19/20 1159 08/19/20 1230 08/19/20 1245  BP: 140/76  108/71 117/62  Pulse: 62  63 (!) 59  Resp: 12  20 14   Temp:      TempSrc:      SpO2: 99%  91% 96%  Weight:  83.5 kg      Constitutional: NAD, calm, comfortable Vitals:   08/19/20 1130 08/19/20 1159 08/19/20 1230 08/19/20 1245  BP: 140/76  108/71 117/62  Pulse: 62  63 (!) 59  Resp: 12  20 14   Temp:      TempSrc:      SpO2: 99%  91% 96%  Weight:  83.5 kg  Eyes: PERRL, lids and conjunctivae normal, appears pale ENMT: Mucous membranes are moist. Posterior pharynx clear of any exudate or lesions.Normal dentition.  Neck: normal, supple, no masses, no thyromegaly Respiratory: clear to auscultation bilaterally, no wheezing, no crackles. Normal respiratory effort. No accessory muscle use.  Cardiovascular: Regular rate and rhythm, no murmurs / rubs / gallops. No extremity edema. 2+ pedal pulses. No carotid bruits.  Abdomen: severe right flank tenderness, no masses palpated. No hepatosplenomegaly. Bowel sounds positive.  Musculoskeletal: no clubbing / cyanosis. No joint deformity upper and lower extremities. Good ROM, no contractures. Normal muscle tone.  Skin: no rashes, lesions, ulcers. No induration Neurologic: CN 2-12 grossly intact. Sensation intact, DTR normal. Strength 5/5 in all 4.  Psychiatric: Normal judgment and insight. Alert and oriented x 3. Normal mood.     Labs on Admission: I have personally reviewed following labs and imaging studies  CBC: Recent Labs  Lab 08/19/20 1020 08/19/20 1226  WBC 9.2  --   NEUTROABS 7.4  --   HGB 14.7 12.9*  HCT 45.0 40.6  MCV 93.2  --   PLT 195  --    Basic Metabolic Panel: Recent Labs  Lab 08/19/20 1020  NA 137  K 4.1  CL 108  CO2 23  GLUCOSE 171*  BUN 16  CREATININE 1.29*  CALCIUM 8.3*   GFR: Estimated Creatinine Clearance: 62.8 mL/min (A) (by C-G formula based on SCr of 1.29 mg/dL (H)). Liver Function  Tests: Recent Labs  Lab 08/19/20 1020  AST 21  ALT 21  ALKPHOS 42  BILITOT 1.0  PROT 5.4*  ALBUMIN 3.2*   Recent Labs  Lab 08/19/20 1020  LIPASE 34   No results for input(s): AMMONIA in the last 168 hours. Coagulation Profile: Recent Labs  Lab 08/19/20 1020  INR 1.2   Cardiac Enzymes: No results for input(s): CKTOTAL, CKMB, CKMBINDEX, TROPONINI in the last 168 hours. BNP (last 3 results) No results for input(s): PROBNP in the last 8760 hours. HbA1C: No results for input(s): HGBA1C in the last 72 hours. CBG: Recent Labs  Lab 08/19/20 1057  GLUCAP 150*   Lipid Profile: No results for input(s): CHOL, HDL, LDLCALC, TRIG, CHOLHDL, LDLDIRECT in the last 72 hours. Thyroid Function Tests: No results for input(s): TSH, T4TOTAL, FREET4, T3FREE, THYROIDAB in the last 72 hours. Anemia Panel: No results for input(s): VITAMINB12, FOLATE, FERRITIN, TIBC, IRON, RETICCTPCT in the last 72 hours. Urine analysis:    Component Value Date/Time   COLORURINE YELLOW 04/25/2008 0301   APPEARANCEUR CLEAR 04/25/2008 0301   LABSPEC 1.018 04/25/2008 0301   PHURINE 7.0 04/25/2008 0301   GLUCOSEU NEGATIVE 04/25/2008 0301   HGBUR NEGATIVE 04/25/2008 0301   BILIRUBINUR NEGATIVE 04/25/2008 0301   KETONESUR NEGATIVE 04/25/2008 0301   PROTEINUR NEGATIVE 04/25/2008 0301   UROBILINOGEN 0.2 04/25/2008 0301   NITRITE NEGATIVE 04/25/2008 0301   LEUKOCYTESUR  04/25/2008 0301    NEGATIVE MICROSCOPIC NOT DONE ON URINES WITH NEGATIVE PROTEIN, BLOOD, LEUKOCYTES, NITRITE, OR GLUCOSE <1000 mg/dL.    Radiological Exams on Admission: CT Angio Chest/Abd/Pel for Dissection W and/or W/WO  Result Date: 08/19/2020 CLINICAL DATA:  Acute onset right back and flank pain. EXAM: CT ANGIOGRAPHY CHEST, ABDOMEN AND PELVIS TECHNIQUE: Non-contrast CT of the chest was initially obtained. Multidetector CT imaging through the chest, abdomen and pelvis was performed using the standard protocol during bolus administration of  intravenous contrast. Multiplanar reconstructed images and MIPs were obtained and reviewed to evaluate the vascular anatomy. CONTRAST:  138mL OMNIPAQUE IOHEXOL  350 MG/ML SOLN COMPARISON:  None. FINDINGS: CTA CHEST FINDINGS Cardiovascular: The heart is normal in size. No pericardial effusion. There is mild tortuosity calcification of the thoracic aorta but no focal aneurysm or dissection. The branch vessels are patent. No obvious coronary artery calcifications. Mediastinum/Nodes: No mediastinal or hilar mass or adenopathy. The esophagus is grossly normal. Thyroid gland is unremarkable. Lungs/Pleura: No acute pulmonary findings. No worrisome pulmonary lesions. No pleural effusion. Musculoskeletal: The bony thorax is intact. Review of the MIP images confirms the above findings. CTA ABDOMEN AND PELVIS FINDINGS VASCULAR Aorta: The aorta demonstrates scattered atherosclerotic calcifications and mild tortuosity. No dissection or focal aneurysm. Celiac: Minimal ostial calcifications.  The vessel is widely patent. SMA: Normal Renals: There are 2 right and 2 left renal arteries. Both of the right renal arteries are quite irregular and may be somewhat beaded. Could not exclude FMD. IMA: Patent Inflow: Scattered atherosclerotic calcifications but no focal aneurysm or dissection. Veins: No significant findings. Review of the MIP images confirms the above findings. NON-VASCULAR Hepatobiliary: No hepatic lesions are identified. No intrahepatic biliary dilatation. The gallbladder is grossly normal. No common bile duct dilatation. Pancreas: No mass, inflammation or ductal dilatation. Spleen: Normal size.  No focal lesions. Adrenals/Urinary Tract: The adrenal glands are unremarkable. The left kidney is unremarkable except for parapelvic cysts. There is a large right-sided perinephric hematoma with mass effect on the right kidney. The hematoma measures approximately 11.5 x 10.0 x 9.0 cm with volume estimated at approximately 500 cc.  There is evidence of active extravasation from the midpole lower pole junction region laterally (image 125/7). I do not see an obvious mass there could be an underlying lesion that bled. No history of trauma. Patient is on Eliquis for AFib. The bladder is unremarkable. Stomach/Bowel: The stomach, duodenum, small bowel and colon are grossly normal without oral contrast. No inflammatory changes, mass lesions or obstructive findings. The appendix is normal. Lymphatic: No mesenteric or retroperitoneal mass or lymphadenopathy. Reproductive: The prostate gland and seminal vesicles are unremarkable. Other: No pelvic mass or adenopathy. No free pelvic fluid collections. No inguinal mass or adenopathy. No abdominal wall hernia or subcutaneous lesions. Musculoskeletal: No significant bony findings. Review of the MIP images confirms the above findings. IMPRESSION: 1. Large right-sided perinephric hematoma with evidence of active extravasation from the midpole lower pole junction region laterally. The hematoma measures approximately 11.5 x 10.0 x 9.0 cm with mass effect on the right kidney. No obvious underlying mass is identified. Recommend interventional radiology consultation. 2. No thoracic or abdominal aortic aneurysm or dissection. 3. Irregular and beaded appearance of the right renal arteries could not exclude FMD. There are 2 right and 2 left renal arteries. 4. No acute pulmonary findings or worrisome pulmonary lesions. 5. Aortic atherosclerosis. These results were called by telephone at the time of interpretation on 08/19/2020 at 11:53 am to provider [facility] , who verbally acknowledged these results. Electronically Signed   By: Marijo Sanes M.D.   On: 08/19/2020 11:53    EKG: Independently reviewed. Sinus bradycardia  Assessment/Plan Active Problems:   Nephritis hemorrhagic  (please populate well all problems here in Problem List. (For example, if patient is on BP meds at home and you resume or decide to  hold them, it is a problem that needs to be her. Same for CAD, COPD, HLD and so on)  Right kidney hemorrhage and hematoma -Appears to be spontaneous -Kcentra infusion completed in ED.  We will plan thinners, check H&H every 6 hours  x4.  Nephrology consulted, plan for series of H&H as above and then transfusion if indicated.  If bleeding not well controlled today or other signs of worsening of hemorrhage such as hypotension, repeated syncope, will consult IR for emergency embolization. -Hold blood thinners and chemical DVT prophylaxis.  Near syncope -Likely vasovagal in nature from severe pain, and quick blood loss from the kidney bleeding. IVF, transfusion as needed.  PAF -In sinus rhythm, as per cardiology, we will hold off Tikosyn and metoprolol  Chronic systolic CHF -Blood pressure on the lower side, will start low rate of IVF at 75 mm/h, monitoring volume status.  HTN with borderline hypotension -Hold BP meds  Asthma -Stable.  DVT prophylaxis: SCD Code Status: Full Code Family Communication: Wife daughter at bedside Disposition Plan: Expect more than 2 midnight hospital stay Consults called: Urology, cARDIO Admission status: PCU   Lequita Halt MD [facility] Pager [phone]  08/19/2020, 1:57 PM

## 2020-08-19 NOTE — ED Notes (Signed)
Attempted report x2 . Floor is saying that pt is not appropriate for the unit.

## 2020-08-19 NOTE — ED Notes (Signed)
Report attempted x 1

## 2020-08-19 NOTE — Progress Notes (Signed)
Responded to ED page Code blue. Patient  Recovered. Wife and family at bedside. Provided emotional and spiritual support, empathic listening and ministry of presence.  Will follow as needed.   Jaclynn Major, [address], [facility], Pager [phone]  .

## 2020-08-19 NOTE — Progress Notes (Signed)
  Echocardiogram 2D Echocardiogram limited with definity has been performed.  Darlina Sicilian M 08/19/2020, 3:11 PM

## 2020-08-19 NOTE — ED Notes (Signed)
Help get patient on the monitor did ekg shown to Dr Vallery Ridge patient is resting with call bell in reach

## 2020-08-19 NOTE — ED Triage Notes (Signed)
Pt arrived by EMS after syncopal event  Pt states that he felt weak and lethargic and then had syncopal event.   Pt states he worked out last night and hasn't drank much water, hx of passing out in past   Hx of Afib, has taken all of his meditations this morning

## 2020-08-19 NOTE — Progress Notes (Signed)
Repeat H/H 12.9 same as 6 hours ago. Next reading midnight.

## 2020-08-19 NOTE — ED Notes (Signed)
Provided pt with urinal and informed him that a urine sample is needed.

## 2020-08-19 NOTE — ED Notes (Signed)
pts wife came to nurses station asking about pt's tikosyn medication, this RN called and talked to pharmacist [place].   Suezanne Jacquet will call me back regarding tikosyn, he has to verify with attending MD. Wife updated.

## 2020-08-20 ENCOUNTER — Encounter (HOSPITAL_COMMUNITY): Payer: Self-pay | Admitting: Internal Medicine

## 2020-08-20 DIAGNOSIS — I480 Paroxysmal atrial fibrillation: Secondary | ICD-10-CM

## 2020-08-20 DIAGNOSIS — I5022 Chronic systolic (congestive) heart failure: Secondary | ICD-10-CM

## 2020-08-20 LAB — HEMOGLOBIN AND HEMATOCRIT, BLOOD
HCT: 32.1 % — ABNORMAL LOW (ref 39.0–52.0)
HCT: 32.8 % — ABNORMAL LOW (ref 39.0–52.0)
Hemoglobin: 10.2 g/dL — ABNORMAL LOW (ref 13.0–17.0)
Hemoglobin: 10.3 g/dL — ABNORMAL LOW (ref 13.0–17.0)

## 2020-08-20 LAB — CBC
HCT: 33 % — ABNORMAL LOW (ref 39.0–52.0)
Hemoglobin: 10.4 g/dL — ABNORMAL LOW (ref 13.0–17.0)
MCH: 30.4 pg (ref 26.0–34.0)
MCHC: 31.5 g/dL (ref 30.0–36.0)
MCV: 96.5 fL (ref 80.0–100.0)
Platelets: 170 10*3/uL (ref 150–400)
RBC: 3.42 MIL/uL — ABNORMAL LOW (ref 4.22–5.81)
RDW: 13.5 % (ref 11.5–15.5)
WBC: 10.7 10*3/uL — ABNORMAL HIGH (ref 4.0–10.5)
nRBC: 0 % (ref 0.0–0.2)

## 2020-08-20 LAB — BASIC METABOLIC PANEL
Anion gap: 8 (ref 5–15)
BUN: 20 mg/dL (ref 8–23)
CO2: 23 mmol/L (ref 22–32)
Calcium: 8.2 mg/dL — ABNORMAL LOW (ref 8.9–10.3)
Chloride: 107 mmol/L (ref 98–111)
Creatinine, Ser: 1.3 mg/dL — ABNORMAL HIGH (ref 0.61–1.24)
GFR, Estimated: >60 mL/min (ref >60)
Glucose, Bld: 123 mg/dL — ABNORMAL HIGH (ref 70–99)
Potassium: 4.2 mmol/L (ref 3.5–5.1)
Sodium: 138 mmol/L (ref 135–145)

## 2020-08-20 LAB — PREPARE RBC (CROSSMATCH)

## 2020-08-20 LAB — APTT: aPTT: 32 seconds (ref 24–36)

## 2020-08-20 LAB — PROTIME-INR
INR: 1.2 (ref 0.8–1.2)
Prothrombin Time: 14.9 seconds (ref 11.4–15.2)

## 2020-08-20 MED ORDER — SODIUM CHLORIDE 0.9% IV SOLUTION: Freq: Once | INTRAVENOUS | Status: DC

## 2020-08-20 NOTE — Progress Notes (Addendum)
Progress Note  Patient Name: Terry [name] Date of Encounter: 08/20/2020  [facility] Cardiologist: Mertie Moores, MD    Subjective    70 yo with hx of PAF, HTN, CHF, TIA orthostatic hypotension admitted with sudden onset of intense back pain and  syncope - found to have had a large retroperitoneal bleed.   His hb has continued to fall.  Is 10.2 this am  Scheduled to have angiogram by IR this am .     Inpatient Medications    Scheduled Meds: . sodium chloride   Intravenous Once  . acidophilus   Oral Daily  . dofetilide  250 mcg Oral BID  . fluticasone  2 spray Each Nare QPM  . magnesium oxide  400 mg Oral BID  . montelukast  10 mg Oral QHS  . pantoprazole  40 mg Oral Daily  . rosuvastatin  5 mg Oral Daily   Continuous Infusions: . sodium chloride 75 mL/hr at 08/20/20 0249  . lactated ringers Stopped (08/19/20 1511)   PRN Meds: acetaminophen **OR** acetaminophen, albuterol, HYDROmorphone (DILAUDID) injection, ondansetron (ZOFRAN) IV   Vital Signs    Vitals:   08/20/20 0615 08/20/20 0630 08/20/20 0700 08/20/20 0836  BP: 108/70 108/72 112/77 117/69  Pulse: (!) 52 (!) 51 (!) 53 (!) 57  Resp: 16 18 15  (!) 22  Temp:   98.5 F (36.9 C)   TempSrc:   Oral   SpO2: 95% 95% 94% 96%  Weight:        Intake/Output Summary (Last 24 hours) at 08/20/2020 0850 Last data filed at 08/20/2020 0847 Gross per 24 hour  Intake --  Output 300 ml  Net -300 ml   Last 3 Weights 08/19/2020 08/19/2020 04/10/2020  Weight (lbs) 184 lb 184 lb 201 lb  Weight (kg) 83.462 kg 83.462 kg 91.173 kg      Telemetry    NSR  - Personally Reviewed  ECG     - Personally Reviewed  Physical Exam   GEN: No acute distress.   Neck: No JVD Cardiac: RRR, no murmurs, rubs, or gallops.  Respiratory: Clear to auscultation bilaterally. GI: + tenderness  MS: No edema; No deformity. Neuro:  Nonfocal  Psych: Normal affect   Labs    High Sensitivity Troponin:   Recent Labs  Lab 08/19/20 1020  08/19/20 1226  TROPONINIHS 6 8      Chemistry Recent Labs  Lab 08/19/20 1020 08/20/20 0326  NA 137 138  K 4.1 4.2  CL 108 107  CO2 23 23  GLUCOSE 171* 123*  BUN 16 20  CREATININE 1.29* 1.30*  CALCIUM 8.3* 8.2*  PROT 5.4*  --   ALBUMIN 3.2*  --   AST 21  --   ALT 21  --   ALKPHOS 42  --   BILITOT 1.0  --   GFRNONAA >60 >60  ANIONGAP 6 8     Hematology Recent Labs  Lab 08/19/20 1020 08/19/20 1226 08/19/20 1732 08/20/20 0326 08/20/20 0730  WBC 9.2  --   --  10.7*  --   RBC 4.83  --   --  3.42*  --   HGB 14.7   < > 12.9* 10.4* 10.2*  HCT 45.0   < > 40.8 33.0* 32.1*  MCV 93.2  --   --  96.5  --   MCH 30.4  --   --  30.4  --   MCHC 32.7  --   --  31.5  --   RDW  13.0  --   --  13.5  --   PLT 195  --   --  170  --    < > = values in this interval not displayed.    BNPNo results for input(s): BNP, PROBNP in the last 168 hours.   DDimer No results for input(s): DDIMER in the last 168 hours.   Radiology    CT Angio Chest/Abd/Pel for Dissection W and/or W/WO  Result Date: 08/19/2020 CLINICAL DATA:  Acute onset right back and flank pain. EXAM: CT ANGIOGRAPHY CHEST, ABDOMEN AND PELVIS TECHNIQUE: Non-contrast CT of the chest was initially obtained. Multidetector CT imaging through the chest, abdomen and pelvis was performed using the standard protocol during bolus administration of intravenous contrast. Multiplanar reconstructed images and MIPs were obtained and reviewed to evaluate the vascular anatomy. CONTRAST:  199mL OMNIPAQUE IOHEXOL 350 MG/ML SOLN COMPARISON:  None. FINDINGS: CTA CHEST FINDINGS Cardiovascular: The heart is normal in size. No pericardial effusion. There is mild tortuosity calcification of the thoracic aorta but no focal aneurysm or dissection. The branch vessels are patent. No obvious coronary artery calcifications. Mediastinum/Nodes: No mediastinal or hilar mass or adenopathy. The esophagus is grossly normal. Thyroid gland is unremarkable. Lungs/Pleura:  No acute pulmonary findings. No worrisome pulmonary lesions. No pleural effusion. Musculoskeletal: The bony thorax is intact. Review of the MIP images confirms the above findings. CTA ABDOMEN AND PELVIS FINDINGS VASCULAR Aorta: The aorta demonstrates scattered atherosclerotic calcifications and mild tortuosity. No dissection or focal aneurysm. Celiac: Minimal ostial calcifications.  The vessel is widely patent. SMA: Normal Renals: There are 2 right and 2 left renal arteries. Both of the right renal arteries are quite irregular and may be somewhat beaded. Could not exclude FMD. IMA: Patent Inflow: Scattered atherosclerotic calcifications but no focal aneurysm or dissection. Veins: No significant findings. Review of the MIP images confirms the above findings. NON-VASCULAR Hepatobiliary: No hepatic lesions are identified. No intrahepatic biliary dilatation. The gallbladder is grossly normal. No common bile duct dilatation. Pancreas: No mass, inflammation or ductal dilatation. Spleen: Normal size.  No focal lesions. Adrenals/Urinary Tract: The adrenal glands are unremarkable. The left kidney is unremarkable except for parapelvic cysts. There is a large right-sided perinephric hematoma with mass effect on the right kidney. The hematoma measures approximately 11.5 x 10.0 x 9.0 cm with volume estimated at approximately 500 cc. There is evidence of active extravasation from the midpole lower pole junction region laterally (image 125/7). I do not see an obvious mass there could be an underlying lesion that bled. No history of trauma. Patient is on Eliquis for AFib. The bladder is unremarkable. Stomach/Bowel: The stomach, duodenum, small bowel and colon are grossly normal without oral contrast. No inflammatory changes, mass lesions or obstructive findings. The appendix is normal. Lymphatic: No mesenteric or retroperitoneal mass or lymphadenopathy. Reproductive: The prostate gland and seminal vesicles are unremarkable. Other:  No pelvic mass or adenopathy. No free pelvic fluid collections. No inguinal mass or adenopathy. No abdominal wall hernia or subcutaneous lesions. Musculoskeletal: No significant bony findings. Review of the MIP images confirms the above findings. IMPRESSION: 1. Large right-sided perinephric hematoma with evidence of active extravasation from the midpole lower pole junction region laterally. The hematoma measures approximately 11.5 x 10.0 x 9.0 cm with mass effect on the right kidney. No obvious underlying mass is identified. Recommend interventional radiology consultation. 2. No thoracic or abdominal aortic aneurysm or dissection. 3. Irregular and beaded appearance of the right renal arteries could not exclude FMD. There  are 2 right and 2 left renal arteries. 4. No acute pulmonary findings or worrisome pulmonary lesions. 5. Aortic atherosclerosis. These results were called by telephone at the time of interpretation on 08/19/2020 at 11:53 am to provider [facility] , who verbally acknowledged these results. Electronically Signed   By: Marijo Sanes M.D.   On: 08/19/2020 11:53   ECHOCARDIOGRAM LIMITED  Result Date: 08/19/2020    ECHOCARDIOGRAM LIMITED REPORT   Patient Name:   Terry [name] Date of Exam: 08/19/2020 Medical Rec #:  [id number]       Height:       73.0 in Accession #:    [id number]      Weight:       184.0 lb Date of Birth:  [birth year]       BSA:          2.077 m Patient Age:    83 years        BP:           104/59 mmHg Patient Gender: M               HR:           68 bpm. Exam Location:  Inpatient Procedure: Limited Echo and Intracardiac Opacification Agent Indications:    Syncope R55  History:        Patient has prior history of Echocardiogram examinations, most                 recent 10/25/2018. CHF; Risk Factors:Hypertension and                 Dyslipidemia. Intermittent asthma, presented with sudden onset                 severe right back pain and syncope. Right-sided perinephric                  hematoma with mass effect                 on the right kidney.  Sonographer:    Darlina Sicilian RDCS Referring Phys: 55 RHONDA G BARRETT  Sonographer Comments: Technically difficult study due to poor echo windows. Unable to move without extreeme nausea.  FINDINGS  Left Ventricle: Definity contrast agent was given IV to delineate the left ventricular endocardial borders. Unfortunately, the images are not interpretable. Suggest repeat evaluation when the patient is able to cooperate with the study. Sanda Klein MD Electronically signed by Sanda Klein MD Signature Date/Time: 08/19/2020/3:19:26 PM    Final     Cardiac Studies     Patient Profile     70 y.o. male with PAF  , on Eliquis.  Was admitted with intense back pain and syncope and found to have a large retroperitoneal bleed.   Assessment & Plan    1.   Syncope:   Due to blood loss with retroperitoneal bleed.     2.   Retroperitoneal hematoma:   eliquis has been stopped.   Going for angiogram this am with IR Ive answered questions about outcomes, expectations.   The IR PA ( Amee) also answered questions. He understands and agrees to proceed  3.  PAF :   Cont tikosyn.  Will restart Eliquis once he is able to take   4.  Chronic systolic CHF:  EF is ~68%.  Agree to repeat a limited echo .  Cont meds.      For questions or updates, please contact [place] Please consult  [email or web address] for contact info under        Signed, Mertie Moores, MD  08/20/2020, 8:50 AM

## 2020-08-20 NOTE — Progress Notes (Signed)
PROGRESS NOTE   Terry [name]  IZT:[id number] DOB: 01-08-1951 DOA: 08/19/2020 PCP: Lawerance Cruel, MD  Brief Narrative:  70 year old extremely active white male (still bicycling) CAD status post cardiac cath?  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 fib Rx/Tikosyn at 01/17/2018 admission CHA2DS2-VASc score?  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 (58-50% 10/2018) remote stroke (at age 23 with central retinal venous occlusion status post laser therapy) Prior closed head injury 2003 right wrist fracture (fall and multiple injuries)  Presented to the emergency room syncopal episode weak lethargic and then syncopal Experienced 10/10 lower back pain with lightheadedness-witnessed syncopal episode in ED with blood pressure dropping into the 80s and CPR performed CTA chest abdomen pelvis = large right-sided perinephric hematoma with mass-effect right kidney 11.5 X10.0X 9.0 Hemoglobin 14-->12.9-->10 ED Rx [facility]  Cardiology consulted neurology consulted Eventually IR consulted  Hospital-Problem based course  11.5 X10.0X 9.0 right-sided perinephric hematoma with mass-effect IR to see and evaluate for imaging-if active bleeding?  Embolization N.p.o. until procedure Acute hypovolemic shock associated with acute blood loss anemia secondary to perinephric hematoma Kcentra given on admit Patient typed and screened-do not transfuse unless falls below transfusion threshold of 7 Hypotension has resolved and patient responded to fluids from admission Pain is now controlled continue Dilaudid 1 mg every as needed Permanent A. fib on Tikosyn, CHA2DS2-VASc >5, has bled >3 [ prior stroke?  Age 70 with central retinal venous occlusion] Shared decision making with Dr. Acie Fredrickson of cardiology Defer further discussions about anticoagulation versus risk of stroke to him in the outpatient setting Continue Tikosyn 250 twice daily-hold Toprol-XL 100 daily at this time Mild AKI on  admission Received resuscitation with fluids-normal saline 75 cc/H until decision about procedure made   DVT prophylaxis: SCD for now Code Status: Full code Family Communication: Discussed with wife at the bedside Disposition:  Status is: Inpatient  Remains inpatient appropriate because:Hemodynamically unstable, Persistent severe electrolyte disturbances and Ongoing diagnostic testing needed not appropriate for outpatient work up   Dispo: The patient is from: Home              Anticipated d/c is to: Home              Patient currently is not medically stable to d/c.   Difficult to place patient No   Consultants:   Urology  Cardiology  Radiology  Procedures:   Antimicrobials:     Subjective:  Awake coherent no distress feels well no chest pain no fever no chills States that right back pain is much improved from prior States that when he did orthostatics he did not feel dizzy He is coherent and intact He has no dark stool tarry stool blurred vision double vision or unilateral weakness at this time  Objective: Vitals:   08/20/20 0615 08/20/20 0630 08/20/20 0700 08/20/20 0836  BP: 108/70 108/72 112/77 117/69  Pulse: (!) 52 (!) 51 (!) 53 (!) 57  Resp: 16 18 15  (!) 22  Temp:   98.5 F (36.9 C)   TempSrc:   Oral   SpO2: 95% 95% 94% 96%  Weight:        Intake/Output Summary (Last 24 hours) at 08/20/2020 0904 Last data filed at 08/20/2020 0847 Gross per 24 hour  Intake --  Output 300 ml  Net -300 ml   Filed Weights   08/19/20 1103 08/19/20 1159  Weight: 83.5 kg 83.5 kg    Examination:  EOMI NCAT no pallor no icterus Neck soft supple no submandibular  lymphadenopathy Abdomen soft no rebound no guarding Mild tenderness in right CVA angle No lower extremity edema Abdomen soft no rebound S1-S2 no murmur no rub no gallop Power 5/5    Data Reviewed: personally reviewed   BUNs/creatinine 16/1.2-->20/1.3 Troponin 6-->8 Hemoglobin  14.7-->12.9-->10.4   Radiology Studies: CT Angio Chest/Abd/Pel for Dissection W and/or W/WO  Result Date: 08/19/2020 CLINICAL DATA:  Acute onset right back and flank pain. EXAM: CT ANGIOGRAPHY CHEST, ABDOMEN AND PELVIS TECHNIQUE: Non-contrast CT of the chest was initially obtained. Multidetector CT imaging through the chest, abdomen and pelvis was performed using the standard protocol during bolus administration of intravenous contrast. Multiplanar reconstructed images and MIPs were obtained and reviewed to evaluate the vascular anatomy. CONTRAST:  142mL OMNIPAQUE IOHEXOL 350 MG/ML SOLN COMPARISON:  None. FINDINGS: CTA CHEST FINDINGS Cardiovascular: The heart is normal in size. No pericardial effusion. There is mild tortuosity calcification of the thoracic aorta but no focal aneurysm or dissection. The branch vessels are patent. No obvious coronary artery calcifications. Mediastinum/Nodes: No mediastinal or hilar mass or adenopathy. The esophagus is grossly normal. Thyroid gland is unremarkable. Lungs/Pleura: No acute pulmonary findings. No worrisome pulmonary lesions. No pleural effusion. Musculoskeletal: The bony thorax is intact. Review of the MIP images confirms the above findings. CTA ABDOMEN AND PELVIS FINDINGS VASCULAR Aorta: The aorta demonstrates scattered atherosclerotic calcifications and mild tortuosity. No dissection or focal aneurysm. Celiac: Minimal ostial calcifications.  The vessel is widely patent. SMA: Normal Renals: There are 2 right and 2 left renal arteries. Both of the right renal arteries are quite irregular and may be somewhat beaded. Could not exclude FMD. IMA: Patent Inflow: Scattered atherosclerotic calcifications but no focal aneurysm or dissection. Veins: No significant findings. Review of the MIP images confirms the above findings. NON-VASCULAR Hepatobiliary: No hepatic lesions are identified. No intrahepatic biliary dilatation. The gallbladder is grossly normal. No common bile  duct dilatation. Pancreas: No mass, inflammation or ductal dilatation. Spleen: Normal size.  No focal lesions. Adrenals/Urinary Tract: The adrenal glands are unremarkable. The left kidney is unremarkable except for parapelvic cysts. There is a large right-sided perinephric hematoma with mass effect on the right kidney. The hematoma measures approximately 11.5 x 10.0 x 9.0 cm with volume estimated at approximately 500 cc. There is evidence of active extravasation from the midpole lower pole junction region laterally (image 125/7). I do not see an obvious mass there could be an underlying lesion that bled. No history of trauma. Patient is on Eliquis for AFib. The bladder is unremarkable. Stomach/Bowel: The stomach, duodenum, small bowel and colon are grossly normal without oral contrast. No inflammatory changes, mass lesions or obstructive findings. The appendix is normal. Lymphatic: No mesenteric or retroperitoneal mass or lymphadenopathy. Reproductive: The prostate gland and seminal vesicles are unremarkable. Other: No pelvic mass or adenopathy. No free pelvic fluid collections. No inguinal mass or adenopathy. No abdominal wall hernia or subcutaneous lesions. Musculoskeletal: No significant bony findings. Review of the MIP images confirms the above findings. IMPRESSION: 1. Large right-sided perinephric hematoma with evidence of active extravasation from the midpole lower pole junction region laterally. The hematoma measures approximately 11.5 x 10.0 x 9.0 cm with mass effect on the right kidney. No obvious underlying mass is identified. Recommend interventional radiology consultation. 2. No thoracic or abdominal aortic aneurysm or dissection. 3. Irregular and beaded appearance of the right renal arteries could not exclude FMD. There are 2 right and 2 left renal arteries. 4. No acute pulmonary findings or worrisome pulmonary lesions. 5. Aortic  atherosclerosis. These results were called by telephone at the time of  interpretation on 08/19/2020 at 11:53 am to provider [facility] , who verbally acknowledged these results. Electronically Signed   By: Marijo Sanes M.D.   On: 08/19/2020 11:53   ECHOCARDIOGRAM LIMITED  Result Date: 08/19/2020    ECHOCARDIOGRAM LIMITED REPORT   Patient Name:   Terry [name] Date of Exam: 08/19/2020 Medical Rec #:  [id number]       Height:       73.0 in Accession #:    [id number]      Weight:       184.0 lb Date of Birth:  1950-06-29       BSA:          2.077 m Patient Age:    50 years        BP:           104/59 mmHg Patient Gender: M               HR:           68 bpm. Exam Location:  Inpatient Procedure: Limited Echo and Intracardiac Opacification Agent Indications:    Syncope R55  History:        Patient has prior history of Echocardiogram examinations, most                 recent 10/25/2018. CHF; Risk Factors:Hypertension and                 Dyslipidemia. Intermittent asthma, presented with sudden onset                 severe right back pain and syncope. Right-sided perinephric                 hematoma with mass effect                 on the right kidney.  Sonographer:    Darlina Sicilian RDCS Referring Phys: 84 RHONDA G BARRETT  Sonographer Comments: Technically difficult study due to poor echo windows. Unable to move without extreeme nausea.  FINDINGS  Left Ventricle: Definity contrast agent was given IV to delineate the left ventricular endocardial borders. Unfortunately, the images are not interpretable. Suggest repeat evaluation when the patient is able to cooperate with the study. Sanda Klein MD Electronically signed by Sanda Klein MD Signature Date/Time: 08/19/2020/3:19:26 PM    Final      Scheduled Meds: . sodium chloride   Intravenous Once  . acidophilus   Oral Daily  . dofetilide  250 mcg Oral BID  . fluticasone  2 spray Each Nare QPM  . magnesium oxide  400 mg Oral BID  . montelukast  10 mg Oral QHS  . pantoprazole  40 mg Oral Daily  . rosuvastatin  5 mg Oral Daily    Continuous Infusions: . sodium chloride 75 mL/hr at 08/20/20 0249  . lactated ringers Stopped (08/19/20 1511)     LOS: 1 day   Time spent: 45 minutes including care coordination time  Nita Sells, MD [facility] To contact the attending provider between 7A-7P or the covering provider during after hours 7P-7A, please log into the web site [email or web address] and access using universal [place] password for that web site. If you do not have the password, please call the hospital operator.  08/20/2020, 9:04 AM

## 2020-08-20 NOTE — Plan of Care (Signed)

## 2020-08-20 NOTE — ED Notes (Signed)
Provider at bedside

## 2020-08-20 NOTE — ED Notes (Signed)
Breakfast order placed ?

## 2020-08-20 NOTE — H&P (Addendum)
Chief Complaint: Patient was seen in consultation today for evaluation for kidney ambulation secondary to large hematoma with mass-effect.  Chief Complaint  Patient presents with  . Loss of Consciousness   at the request of  Dr. Verlon Au, Lenna Sciara.   Referring Physician(s): Dr. Verlon Au, J.   Supervising Physician: Ruthann Cancer  Patient Status: [facility] - ED  History of Present Illness: Terry [name] is a 70 y.o. male with PMH of a ureteral dilation, cardiomyopathy, pericarditis, PAF on Eliquis, chronic systolic CHF, HTN, HLD, and central retinal vein occlusion, who was brought to [facility] ED on 08/19/2020 due to right flank pain and loss of consciousness.  CBC showed no anemia, and vital signs were stable with no hypotension or tachycardia at the time of her eval to ED. Patient underwent CT angio CAP for dissection in the ED which showed:  1. Large right-sided perinephric hematoma with evidence of active extravasation from the midpole lower pole junction region laterally. The hematoma measures approximately 11.5 x 10.0 x 9.0 cm with mass effect on the right kidney. No obvious underlying mass is identified. Recommend interventional radiology consultation. 2. No thoracic or abdominal aortic aneurysm or dissection. 3. Irregular and beaded appearance of the right renal arteries could not exclude FMD. There are 2 right and 2 left renal arteries. 4. No acute pulmonary findings or worrisome pulmonary lesions. 5. Aortic atherosclerosis.  Eliquis was reversed by Eppie Gibson in ED, and patient was admitted for further evaluation and management of the right perirenal hematoma. Hgb at the presentation to ED was 14.7, repeat CBC showed hgb trending down, with lowest 10.4 at 0326 hrs on 08/20/20. VS showed normal BP but bradycardia with HR of 57, tachypnea with RR of 22.    IR was requested for an evaluation for kidney ambulation secondary to large hematoma with mass-effect.  Patient sitting in bed, not in acute  distress.  Patient's wife, RN, and cardiologist Dr. Acie Fredrickson at the bedside.  Patient states that he was short of breath when he first came to the emergency department, but he is not short of breath currently. Patient reports persistent moderate right flank pain radiates down to right groin area. Denise headache, fever, chills, cough, chest pain, chest palpitation, abdominal pain, nausea ,vomiting, and bleeding.   Past Medical History:  Diagnosis Date  . Aortic root dilation (HCC)    a. mild by CT 10/2017.  [name] Basal cell carcinoma 10/02/2019   left hand bcc nod.  . Cardiomyopathy ([place])   . Central retinal vein occlusion   . Chronic systolic CHF (congestive heart failure) ([place])   . ED (erectile dysfunction)   . H/O cardiac catheterization 2002 - normal cors  . HTN (hypertension)   . Hyperlipidemia   . Hypokalemia   . Leg edema   . LV dysfunction    a. EF appeared mildly depressed by TEE 08/2015, no % given.  . Mini stroke [facility])    a. ? Age 12 at time of central retinal vein occlusion - lost vision in eye  . PAF (paroxysmal atrial fibrillation) ([place])    a. Dx 06/2015, s/p TEE/DCCV 08/2015.  [name] Pericarditis 2010  . Syncope    Hx of thought to be due to orthostatic hypotension  . Visit for monitoring Tikosyn therapy 01/17/2018    Past Surgical History:  Procedure Laterality Date  . CARDIAC CATHETERIZATION  2002  . CARDIOVERSION N/A 09/08/2015   Procedure: CARDIOVERSION;  Surgeon: Fay Records, MD;  Location: [place];  Service: Cardiovascular;  Laterality: N/A;  . CARDIOVERSION N/A 06/28/2017   Procedure: CARDIOVERSION;  Surgeon: Jerline Pain, MD;  Location: [facility] ENDOSCOPY;  Service: Cardiovascular;  Laterality: N/A;  . HAND SURGERY  2003  . KNEE SURGERY    . NASAL SINUS SURGERY  2003   x2. Scar tissue build up and had to recreate his tear duct  . PERICARDIECTOMY    . TEE WITHOUT CARDIOVERSION N/A 09/08/2015   Procedure: TRANSESOPHAGEAL ECHOCARDIOGRAM (TEE);  Surgeon: Fay Records,  MD;  Location: [facility] ENDOSCOPY;  Service: Cardiovascular;  Laterality: N/A;  . TEE WITHOUT CARDIOVERSION N/A 06/28/2017   Procedure: TRANSESOPHAGEAL ECHOCARDIOGRAM (TEE);  Surgeon: Jerline Pain, MD;  Location: [facility] ENDOSCOPY;  Service: Cardiovascular;  Laterality: N/A;    Allergies: Losartan and Penicillins  Medications: Prior to Admission medications   Medication Sig Start Date End Date Taking? Authorizing Provider  albuterol (VENTOLIN HFA) 108 (90 Base) MCG/ACT inhaler Inhale 2 puffs into the lungs every 4 (four) hours as needed for wheezing or shortness of breath. 03/02/19  Yes Nahser, Wonda Cheng, MD  dofetilide (TIKOSYN) 250 MCG capsule Take 1 capsule (250 mcg total) by mouth 2 (two) times daily. 05/01/20  Yes Nahser, Wonda Cheng, MD  ELIQUIS 5 MG TABS tablet TAKE 1 TABLET BY MOUTH TWICE A DAY Patient taking differently: Take 1 tablet by mouth 2 (two) times daily. 07/07/20  Yes Nahser, Wonda Cheng, MD  fluticasone (FLONASE) 50 MCG/ACT nasal spray Place 2 sprays into both nostrils every evening.  04/12/12  Yes [provider]  Magnesium Oxide 400 MG CAPS Take 1 capsule (400 mg total) by mouth in the morning and at bedtime. 05/05/20  Yes Nahser, Wonda Cheng, MD  metoprolol succinate (TOPROL-XL) 100 MG 24 hr tablet Take 1 tablet (100 mg total) by mouth daily. Take with or immediately following a meal. 01/01/20  Yes Nahser, Wonda Cheng, MD  potassium chloride (KLOR-CON) 10 MEQ tablet TAKE 1 TABLET BY MOUTH EVERY DAY Patient taking differently: Take 10 mEq by mouth daily. 04/09/20  Yes Nahser, Wonda Cheng, MD  Probiotic Product (PROBIOTIC DAILY PO) Take 1 capsule by mouth daily.   Yes [provider]  rosuvastatin (CRESTOR) 5 MG tablet Take 5 mg by mouth every evening. 07/31/15  Yes [provider]  tadalafil (CIALIS) 20 MG tablet Take 1 tablet (20 mg total) by mouth daily as needed. 02/16/11  Yes Nahser, Wonda Cheng, MD     Family History  Problem Relation Age of Onset  . Hypertension Mother    . Diabetes Mother   . Cancer Mother   . Atrial fibrillation Father   . Heart disease Father        had pacemaker  . Hypertension Father   . Diabetes Father     Social History   Socioeconomic History  . Marital status: Married    Spouse name: Not on file  . Number of children: Not on file  . Years of education: Not on file  . Highest education level: Not on file  Occupational History  . Not on file  Tobacco Use  . Smoking status: Never Smoker  . Smokeless tobacco: Never Used  Vaping Use  . Vaping Use: Never used  Substance and Sexual Activity  . Alcohol use: Not Currently    Comment: 2-3 Times a Week Wine with Meals  . Drug use: Never  . Sexual activity: Not on file  Other Topics Concern  . Not on file  Social History Narrative  . Not on file  Social Determinants of Health   Financial Resource Strain: Not on file  Food Insecurity: Not on file  Transportation Needs: Not on file  Physical Activity: Not on file  Stress: Not on file  Social Connections: Not on file     Review of Systems: A 12 point ROS discussed and pertinent positives are indicated in the HPI above.  All other systems are negative.   Vital Signs: BP 117/69 (BP Location: Right Arm)   Pulse (!) 57   Temp 98.5 F (36.9 C) (Oral)   Resp (!) 22   Wt 184 lb (83.5 kg)   SpO2 96%   BMI 24.28 kg/m   Physical Exam  Vitals and nursing note reviewed.  Constitutional:      General: He is not in acute distress.    Appearance: Normal appearance.  HENT:     Head: Normocephalic and atraumatic.     Mouth/Throat:     Mouth: Mucous membranes are moist.     Pharynx: Oropharynx is clear.  Cardiovascular:     Rate and Rhythm: Normal rate and regular rhythm.     Pulses: Normal pulses.     Heart sounds: Heart sounds soft but normal. Pulmonary:     Effort: Pulmonary effort is normal.     Breath sounds: Normal breath sounds. No wheezing, rhonchi or rales.  Abdominal:     General: Bowel sounds are  normal. There is no distension.     Palpations: Abdomen is soft.  Skin:    General: Skin is warm and dry.  Neurological:     Mental Status: He is alert and oriented to person, place, and time.  Psychiatric:        Mood and Affect: Mood normal.        Behavior: Behavior normal.    MD Evaluation Airway: WNL Heart: WNL Abdomen: WNL Chest/ Lungs: WNL ASA  Classification: 2 Mallampati/Airway Score: Two  Imaging: CT Angio Chest/Abd/Pel for Dissection W and/or W/WO  Result Date: 08/19/2020 CLINICAL DATA:  Acute onset right back and flank pain. EXAM: CT ANGIOGRAPHY CHEST, ABDOMEN AND PELVIS TECHNIQUE: Non-contrast CT of the chest was initially obtained. Multidetector CT imaging through the chest, abdomen and pelvis was performed using the standard protocol during bolus administration of intravenous contrast. Multiplanar reconstructed images and MIPs were obtained and reviewed to evaluate the vascular anatomy. CONTRAST:  135mL OMNIPAQUE IOHEXOL 350 MG/ML SOLN COMPARISON:  None. FINDINGS: CTA CHEST FINDINGS Cardiovascular: The heart is normal in size. No pericardial effusion. There is mild tortuosity calcification of the thoracic aorta but no focal aneurysm or dissection. The branch vessels are patent. No obvious coronary artery calcifications. Mediastinum/Nodes: No mediastinal or hilar mass or adenopathy. The esophagus is grossly normal. Thyroid gland is unremarkable. Lungs/Pleura: No acute pulmonary findings. No worrisome pulmonary lesions. No pleural effusion. Musculoskeletal: The bony thorax is intact. Review of the MIP images confirms the above findings. CTA ABDOMEN AND PELVIS FINDINGS VASCULAR Aorta: The aorta demonstrates scattered atherosclerotic calcifications and mild tortuosity. No dissection or focal aneurysm. Celiac: Minimal ostial calcifications.  The vessel is widely patent. SMA: Normal Renals: There are 2 right and 2 left renal arteries. Both of the right renal arteries are quite irregular  and may be somewhat beaded. Could not exclude FMD. IMA: Patent Inflow: Scattered atherosclerotic calcifications but no focal aneurysm or dissection. Veins: No significant findings. Review of the MIP images confirms the above findings. NON-VASCULAR Hepatobiliary: No hepatic lesions are identified. No intrahepatic biliary dilatation. The gallbladder is grossly normal.  No common bile duct dilatation. Pancreas: No mass, inflammation or ductal dilatation. Spleen: Normal size.  No focal lesions. Adrenals/Urinary Tract: The adrenal glands are unremarkable. The left kidney is unremarkable except for parapelvic cysts. There is a large right-sided perinephric hematoma with mass effect on the right kidney. The hematoma measures approximately 11.5 x 10.0 x 9.0 cm with volume estimated at approximately 500 cc. There is evidence of active extravasation from the midpole lower pole junction region laterally (image 125/7). I do not see an obvious mass there could be an underlying lesion that bled. No history of trauma. Patient is on Eliquis for AFib. The bladder is unremarkable. Stomach/Bowel: The stomach, duodenum, small bowel and colon are grossly normal without oral contrast. No inflammatory changes, mass lesions or obstructive findings. The appendix is normal. Lymphatic: No mesenteric or retroperitoneal mass or lymphadenopathy. Reproductive: The prostate gland and seminal vesicles are unremarkable. Other: No pelvic mass or adenopathy. No free pelvic fluid collections. No inguinal mass or adenopathy. No abdominal wall hernia or subcutaneous lesions. Musculoskeletal: No significant bony findings. Review of the MIP images confirms the above findings. IMPRESSION: 1. Large right-sided perinephric hematoma with evidence of active extravasation from the midpole lower pole junction region laterally. The hematoma measures approximately 11.5 x 10.0 x 9.0 cm with mass effect on the right kidney. No obvious underlying mass is identified.  Recommend interventional radiology consultation. 2. No thoracic or abdominal aortic aneurysm or dissection. 3. Irregular and beaded appearance of the right renal arteries could not exclude FMD. There are 2 right and 2 left renal arteries. 4. No acute pulmonary findings or worrisome pulmonary lesions. 5. Aortic atherosclerosis. These results were called by telephone at the time of interpretation on 08/19/2020 at 11:53 am to provider [facility] , who verbally acknowledged these results. Electronically Signed   By: Marijo Sanes M.D.   On: 08/19/2020 11:53   ECHOCARDIOGRAM LIMITED  Result Date: 08/19/2020    ECHOCARDIOGRAM LIMITED REPORT   Patient Name:   Terry [name] Date of Exam: 08/19/2020 Medical Rec #:  [id number]       Height:       73.0 in Accession #:    [id number]      Weight:       184.0 lb Date of Birth:  08-Jan-1951       BSA:          2.077 m Patient Age:    9 years        BP:           104/59 mmHg Patient Gender: M               HR:           68 bpm. Exam Location:  Inpatient Procedure: Limited Echo and Intracardiac Opacification Agent Indications:    Syncope R55  History:        Patient has prior history of Echocardiogram examinations, most                 recent 10/25/2018. CHF; Risk Factors:Hypertension and                 Dyslipidemia. Intermittent asthma, presented with sudden onset                 severe right back pain and syncope. Right-sided perinephric                 hematoma with mass effect  on the right kidney.  Sonographer:    Darlina Sicilian RDCS Referring Phys: 57 RHONDA G BARRETT  Sonographer Comments: Technically difficult study due to poor echo windows. Unable to move without extreeme nausea.  FINDINGS  Left Ventricle: Definity contrast agent was given IV to delineate the left ventricular endocardial borders. Unfortunately, the images are not interpretable. Suggest repeat evaluation when the patient is able to cooperate with the study. Sanda Klein MD  Electronically signed by Sanda Klein MD Signature Date/Time: 08/19/2020/3:19:26 PM    Final     Labs:  CBC: Recent Labs    10/08/19 0817 08/19/20 1020 08/19/20 1020 08/19/20 1226 08/19/20 1732 08/20/20 0326 08/20/20 0730  WBC 5.4 9.2  --   --   --  10.7*  --   HGB 16.1 14.7   < > 12.9* 12.9* 10.4* 10.2*  HCT 48.2 45.0   < > 40.6 40.8 33.0* 32.1*  PLT 209 195  --   --   --  170  --    < > = values in this interval not displayed.    COAGS: Recent Labs    08/19/20 1020 08/20/20 0326  INR 1.2 1.2  APTT  --  32    BMP: Recent Labs    10/08/19 0817 04/16/20 0735 05/02/20 0726 08/19/20 1020 08/20/20 0326  NA 143 140 140 137 138  K 4.0 4.1 4.2 4.1 4.2  CL 106 102 103 108 107  CO2 24 27 24 23 23   GLUCOSE 93 94 95 171* 123*  BUN 19 17 16 16 20   CALCIUM 8.9 9.0 9.1 8.3* 8.2*  CREATININE 1.12 0.96 1.01 1.29* 1.30*  GFRNONAA 68 81 77 >60 >60  GFRAA 79 94 89  --   --     LIVER FUNCTION TESTS: Recent Labs    10/08/19 0817 08/19/20 1020  BILITOT 1.0 1.0  AST 17 21  ALT 18 21  ALKPHOS 54 42  PROT 5.9* 5.4*  ALBUMIN 4.0 3.2*    TUMOR MARKERS: No results for input(s): AFPTM, CEA, CA199, CHROMGRNA in the last 8760 hours.  Assessment and Plan: 70 y.o. male who presented to [facility] ED with chief complaint of right flank pain and loss of consciousness.  Underwent CT angio CAP which ruled out aortic dissection, however showed large right perinephritic hematoma with evidence of active extravasation from the midpole lower pole junction region laterally. Patient's hemoglobin was 14.7 at the time of presentation to ED, and vital signs were stable with no hypotension, tachycardia, tachypnea.  Repeat CBC showed hemoglobin trending down with lowest being 10.4. Repeat vitals show no hypertension, however, patient now bradycardic and tachypneic.  IR was requested for evaluation for kidney ambulation secondary to large hematoma with mass-effect. Case was reviewed and and approved  for mesenteric arteriogram with possible embolization  By Dr. Serafina Royals.   Chart review reveled pt not npo.  Patient was evaluated at the bedside, appeared hemodynamically stable.  Patient and his wife states that pt ate two slices of apple and a piece of chocolate around 7:30 this morning.  Discussed with Dr. Serafina Royals, will repeat CBC at noon, and if it shows drop in hgb again, we will proceed with the procedure.   Asked RN to notify the patient and family, patient and his wife are agreeable to proceed with repeat CBC at noon and possible arteriogram with possible embolization.   Procedure is tentatively scheduled for today pending repeat CBC and IR schedule.   Risks and benefits of mesenteric arteriogram  and possible embolization were discussed with the patient including, but not limited to bleeding, infection, vascular injury or contrast induced renal failure.  This interventional procedure involves the use of X-rays and because of the nature of the planned procedure, it is possible that we will have prolonged use of X-ray fluoroscopy.  Potential radiation risks to you include (but are not limited to) the following: - A slightly elevated risk for cancer  several years later in life. This risk is typically less than 0.5% percent. This risk is low in comparison to the normal incidence of human cancer, which is 33% for women and 50% for men according to the [employer]. - Radiation induced injury can include skin redness, resembling a rash, tissue breakdown / ulcers and hair loss (which can be temporary or permanent).   The likelihood of either of these occurring depends on the difficulty of the procedure and whether you are sensitive to radiation due to previous procedures, disease, or genetic conditions.   IF your procedure requires a prolonged use of radiation, you will be notified and given written instructions for further action.  It is your responsibility to monitor the  irradiated area for the 2 weeks following the procedure and to notify your physician if you are concerned that you have suffered a radiation induced injury.    All of the patient and wife's questions were answered, patient and his wife are agreeable to proceed.  Consent signed and in IR binder.   T how thehank you for this interesting consult.  I greatly enjoyed meeting Davius Goudeau and look forward to participating in their care.  A copy of this report was sent to the requesting provider on this date.  Electronically Signed: Tera Mater, PA-C 08/20/2020, 9:04 AM   I spent a total of  35 min   in face to face in clinical consultation, greater than 50% of which was counseling/coordinating care for  mesenteric arteriogram and possible embolization.

## 2020-08-20 NOTE — Hospital Course (Addendum)
70 year old extremely active white male (still bicycling) CAD status post cardiac cath?  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 fib Rx/Tikosyn at 01/17/2018 admission CHA2DS2-VASc score?  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 (58-50% 10/2018) remote stroke (at age 68 with central retinal venous occlusion status post laser therapy) Prior closed head injury 2003 right wrist fracture (fall and multiple injuries)  Presented to the emergency room syncopal episode weak lethargic and then syncopal Experienced 10/10 lower back pain with lightheadedness-witnessed syncopal episode in ED with blood pressure dropping into the 80s and CPR performed CTA chest abdomen pelvis = large right-sided perinephric hematoma with mass-effect right kidney 11.5 X10.0X 9.0 Hemoglobin 14-->12.9 ED Rx Kcentra  urologist consulted   BUNs/creatinine 16/1.2-->20/1.3 Troponin 6-->8 Hemoglobin 14.7-->12.9-->10.4

## 2020-08-20 NOTE — Progress Notes (Signed)
Repeat Hgb and HCT at 1200 hrs showed no further drop.   VSS, pt reevaluated at the bedside, appears hemodynamically stable.  Discussed with Dr. Serafina Royals, mesenteric arteriogram with possible embolization to be on hold.   Informed RN, patient and his wife.  NPO order discontinued.   Will keep the IR Rad eval order for now, please contact IR if patient decompensates. Ordering MD notified.    Armando Gang Candela Krul PA-C 08/20/2020 3:33 PM

## 2020-08-21 DIAGNOSIS — R58 Hemorrhage, not elsewhere classified: Secondary | ICD-10-CM

## 2020-08-21 LAB — CBC WITH DIFFERENTIAL/PLATELET
Abs Immature Granulocytes: 0.02 10*3/uL (ref 0.00–0.07)
Basophils Absolute: 0 10*3/uL (ref 0.0–0.1)
Basophils Relative: 1 %
Eosinophils Absolute: 0.2 10*3/uL (ref 0.0–0.5)
Eosinophils Relative: 2 %
HCT: 29.7 % — ABNORMAL LOW (ref 39.0–52.0)
Hemoglobin: 9.5 g/dL — ABNORMAL LOW (ref 13.0–17.0)
Immature Granulocytes: 0 %
Lymphocytes Relative: 18 %
Lymphs Abs: 1.3 10*3/uL (ref 0.7–4.0)
MCH: 30.2 pg (ref 26.0–34.0)
MCHC: 32 g/dL (ref 30.0–36.0)
MCV: 94.3 fL (ref 80.0–100.0)
Monocytes Absolute: 0.8 10*3/uL (ref 0.1–1.0)
Monocytes Relative: 11 %
Neutro Abs: 4.9 10*3/uL (ref 1.7–7.7)
Neutrophils Relative %: 68 %
Platelets: 138 10*3/uL — ABNORMAL LOW (ref 150–400)
RBC: 3.15 MIL/uL — ABNORMAL LOW (ref 4.22–5.81)
RDW: 13.3 % (ref 11.5–15.5)
WBC: 7.2 10*3/uL (ref 4.0–10.5)
nRBC: 0 % (ref 0.0–0.2)

## 2020-08-21 LAB — COMPREHENSIVE METABOLIC PANEL
ALT: 18 U/L (ref 0–44)
AST: 34 U/L (ref 15–41)
Albumin: 2.6 g/dL — ABNORMAL LOW (ref 3.5–5.0)
Alkaline Phosphatase: 33 U/L — ABNORMAL LOW (ref 38–126)
Anion gap: 4 — ABNORMAL LOW (ref 5–15)
BUN: 13 mg/dL (ref 8–23)
CO2: 28 mmol/L (ref 22–32)
Calcium: 7.9 mg/dL — ABNORMAL LOW (ref 8.9–10.3)
Chloride: 107 mmol/L (ref 98–111)
Creatinine, Ser: 1.14 mg/dL (ref 0.61–1.24)
GFR, Estimated: >60 mL/min (ref >60)
Glucose, Bld: 107 mg/dL — ABNORMAL HIGH (ref 70–99)
Potassium: 4 mmol/L (ref 3.5–5.1)
Sodium: 139 mmol/L (ref 135–145)
Total Bilirubin: 0.6 mg/dL (ref 0.3–1.2)
Total Protein: 4.4 g/dL — ABNORMAL LOW (ref 6.5–8.1)

## 2020-08-21 NOTE — Progress Notes (Signed)
PROGRESS NOTE   Terry [name]  JGG:[id number] DOB: 1950-03-21 DOA: 08/19/2020 PCP: Lawerance Cruel, MD  Brief Narrative:  70 year old extremely active white male (still bicycling) CAD status post cardiac cath?  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 fib Rx/Tikosyn at 01/17/2018 admission CHA2DS2-VASc score?  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 (58-50% 10/2018) remote stroke (at age 21 with central retinal venous occlusion status post laser therapy) Prior closed head injury 2003 right wrist fracture (fall and multiple injuries)  Presented to the emergency room syncopal episode weak lethargic and then syncopal Experienced 10/10 lower back pain with lightheadedness-witnessed syncopal episode in ED with blood pressure dropping into the 80s and CPR performed CTA chest abdomen pelvis = large right-sided perinephric hematoma with mass-effect right kidney 11.5 X10.0X 9.0 Hemoglobin 14-->12.9-->10 ED Rx [facility]  Cardiology consulted neurology consulted Eventually IR consulted  Hospital-Problem based course  11.5 X 10.0 X 9.0 right-sided perinephric hematoma with mass-effect IR evaluation appreciated- renal embolization/angiography on hold given hemodynamic stability Not currently ready for discharge Urology Dr. Abner Greenspan and I discussed case--recommend no heavy lifting or exercise for 4-6 weeks and to follow up in about 1 month with Dr.Manny with a CT scan in Urology office Acute hypovolemic shock associated with acute blood loss anemia secondary to perinephric hematoma Kcentra given on admit Shock physiology resolved-no need for further transfusions-DC saline but keep IV Transition pain meds as needed to orals-discontinue Dilaudid Permanent A. fib on Tikosyn, CHA2DS2-VASc >5, has bled >3 [prior stroke?  Age 16 with central retinal venous occlusion] Shared decision making with Dr. Acie Fredrickson of cardiology anticoagulation versus risk of stroke to him in the outpatient  setting Continue Tikosyn 250 twice daily-slowly resume beta-blocker in the next several days Mild AKI on admission Resolved   DVT prophylaxis: SCD for now Code Status: Full code Family Communication: Discussed with wife/daughter at the bedside Disposition:  Status is: Inpatient  Remains inpatient appropriate because:Hemodynamically unstable, Persistent severe electrolyte disturbances and Ongoing diagnostic testing needed not appropriate for outpatient work up   Dispo: The patient is from: Home              Anticipated d/c is to: Home              Patient currently is not medically stable to d/c.   Difficult to place patient No   Consultants:  Urology Cardiology Radiology  Procedures:   Antimicrobials:     Subjective:  Awake coherent in nad no focal deficit -fever -chills -n -vomit Urinating a lot  Objective: Vitals:   08/20/20 2100 08/20/20 2328 08/21/20 0359 08/21/20 0813  BP:  134/80 122/74 140/83  Pulse: 60 60 (!) 57 61  Resp: 13 19 18 14   Temp:  (!) 97.5 F (36.4 C) 97.6 F (36.4 C) 98.3 F (36.8 C)  TempSrc:  Oral Oral Oral  SpO2: 96% 97% 100% 97%  Weight:        Intake/Output Summary (Last 24 hours) at 08/21/2020 1148 Last data filed at 08/21/2020 0600 Gross per 24 hour  Intake 2200.05 ml  Output --  Net 2200.05 ml    Filed Weights   08/19/20 1103 08/19/20 1159 08/20/20 1418  Weight: 83.5 kg 83.5 kg 90.2 kg    Examination:  Awake coherent in nad no focal deficit No ict no pallor S1 S2 in NSR on monitors Abd soft, no rebound no guard--some R sided tenderness with deep pressure Neuro intact psych euthymic   Data Reviewed: personally reviewed   BUNs/creatinine 16/1.2-->20/1.3-->13/1.14  Troponin 6-->8 Hemoglobin 14.7-->12.9-->10.4-->9.5   Radiology Studies: ECHOCARDIOGRAM LIMITED  Result Date: 08/19/2020    ECHOCARDIOGRAM LIMITED REPORT   Patient Name:   Terry [name] Date of Exam: 08/19/2020 Medical Rec #:  [id number]       Height:        73.0 in Accession #:    [id number]      Weight:       184.0 lb Date of Birth:  1950/11/29       BSA:          2.077 m Patient Age:    62 years        BP:           104/59 mmHg Patient Gender: M               HR:           68 bpm. Exam Location:  Inpatient Procedure: Limited Echo and Intracardiac Opacification Agent Indications:    Syncope R55  History:        Patient has prior history of Echocardiogram examinations, most                 recent 10/25/2018. CHF; Risk Factors:Hypertension and                 Dyslipidemia. Intermittent asthma, presented with sudden onset                 severe right back pain and syncope. Right-sided perinephric                 hematoma with mass effect                 on the right kidney.  Sonographer:    Darlina Sicilian RDCS Referring Phys: 45 RHONDA G BARRETT  Sonographer Comments: Technically difficult study due to poor echo windows. Unable to move without extreeme nausea.  FINDINGS  Left Ventricle: Definity contrast agent was given IV to delineate the left ventricular endocardial borders. Unfortunately, the images are not interpretable. Suggest repeat evaluation when the patient is able to cooperate with the study. Dani Gobble Croitoru MD Electronically signed by Sanda Klein MD Signature Date/Time: 08/19/2020/3:19:26 PM    Final       Scheduled Meds:  sodium chloride   Intravenous Once   acidophilus   Oral Daily   dofetilide  250 mcg Oral BID   fluticasone  2 spray Each Nare QPM   magnesium oxide  400 mg Oral BID   montelukast  10 mg Oral QHS   pantoprazole  40 mg Oral Daily   rosuvastatin  5 mg Oral Daily   Continuous Infusions:  lactated ringers 75 mL/hr at 08/20/20 2305     LOS: 2 days   Time spent: 45 minutes including discussion time with consultants  Nita Sells, MD [facility] To contact the attending provider between 7A-7P or the covering provider during after hours 7P-7A, please log into the web site [email or web address] and access using  universal [place] password for that web site. If you do not have the password, please call the hospital operator.  08/21/2020, 11:48 AM

## 2020-08-21 NOTE — Progress Notes (Signed)
Progress Note  Patient Name: Terry [name] Date of Encounter: 08/21/2020  [facility] Cardiologist: Mertie Moores, MD  Subjective   70 yo admitted with acute onset of back pain and subsequent syncope. Has PAF and was on eliquis  IR has been consulted but has but plans for angio and possible thrombosis has been put on hold  Hb is down slightly . Mild abd pain .    Inpatient Medications    Scheduled Meds:  sodium chloride   Intravenous Once   acidophilus   Oral Daily   dofetilide  250 mcg Oral BID   fluticasone  2 spray Each Nare QPM   magnesium oxide  400 mg Oral BID   montelukast  10 mg Oral QHS   pantoprazole  40 mg Oral Daily   rosuvastatin  5 mg Oral Daily   Continuous Infusions:  lactated ringers 75 mL/hr at 08/20/20 2305   PRN Meds: acetaminophen **OR** acetaminophen, albuterol, HYDROmorphone (DILAUDID) injection, ondansetron (ZOFRAN) IV   Vital Signs    Vitals:   08/20/20 2100 08/20/20 2328 08/21/20 0359 08/21/20 0813  BP:  134/80 122/74 140/83  Pulse: 60 60 (!) 57 61  Resp: 13 19 18 14   Temp:  (!) 97.5 F (36.4 C) 97.6 F (36.4 C) 98.3 F (36.8 C)  TempSrc:  Oral Oral Oral  SpO2: 96% 97% 100% 97%  Weight:        Intake/Output Summary (Last 24 hours) at 08/21/2020 0951 Last data filed at 08/21/2020 0600 Gross per 24 hour  Intake 2200.05 ml  Output --  Net 2200.05 ml   Last 3 Weights 08/20/2020 08/19/2020 08/19/2020  Weight (lbs) 198 lb 13.7 oz 184 lb 184 lb  Weight (kg) 90.2 kg 83.462 kg 83.462 kg      Telemetry    NSR  - Personally Reviewed  ECG     - Personally Reviewed  Physical Exam   GEN: No acute distress.   Neck: No JVD Cardiac: RRR, no murmurs, rubs, or gallops.  Respiratory: Clear to auscultation bilaterally. GI: Soft, mildly tender,  + BS  MS: No edema; No deformity. Neuro:  Nonfocal  Psych: Normal affect   Labs    High Sensitivity Troponin:   Recent Labs  Lab 08/19/20 1020 08/19/20 1226  TROPONINIHS 6 8       Chemistry Recent Labs  Lab 08/19/20 1020 08/20/20 0326 08/21/20 0039  NA 137 138 139  K 4.1 4.2 4.0  CL 108 107 107  CO2 23 23 28   GLUCOSE 171* 123* 107*  BUN 16 20 13   CREATININE 1.29* 1.30* 1.14  CALCIUM 8.3* 8.2* 7.9*  PROT 5.4*  --  4.4*  ALBUMIN 3.2*  --  2.6*  AST 21  --  34  ALT 21  --  18  ALKPHOS 42  --  33*  BILITOT 1.0  --  0.6  GFRNONAA >60 >60 >60  ANIONGAP 6 8 4*     Hematology Recent Labs  Lab 08/19/20 1020 08/19/20 1226 08/20/20 0326 08/20/20 0730 08/20/20 1204 08/21/20 0039  WBC 9.2  --  10.7*  --   --  7.2  RBC 4.83  --  3.42*  --   --  3.15*  HGB 14.7   < > 10.4* 10.2* 10.3* 9.5*  HCT 45.0   < > 33.0* 32.1* 32.8* 29.7*  MCV 93.2  --  96.5  --   --  94.3  MCH 30.4  --  30.4  --   --  30.2  MCHC 32.7  --  31.5  --   --  32.0  RDW 13.0  --  13.5  --   --  13.3  PLT 195  --  170  --   --  138*   < > = values in this interval not displayed.    BNPNo results for input(s): BNP, PROBNP in the last 168 hours.   DDimer No results for input(s): DDIMER in the last 168 hours.   Radiology    CT Angio Chest/Abd/Pel for Dissection W and/or W/WO  Result Date: 08/19/2020 CLINICAL DATA:  Acute onset right back and flank pain. EXAM: CT ANGIOGRAPHY CHEST, ABDOMEN AND PELVIS TECHNIQUE: Non-contrast CT of the chest was initially obtained. Multidetector CT imaging through the chest, abdomen and pelvis was performed using the standard protocol during bolus administration of intravenous contrast. Multiplanar reconstructed images and MIPs were obtained and reviewed to evaluate the vascular anatomy. CONTRAST:  180mL OMNIPAQUE IOHEXOL 350 MG/ML SOLN COMPARISON:  None. FINDINGS: CTA CHEST FINDINGS Cardiovascular: The heart is normal in size. No pericardial effusion. There is mild tortuosity calcification of the thoracic aorta but no focal aneurysm or dissection. The branch vessels are patent. No obvious coronary artery calcifications. Mediastinum/Nodes: No mediastinal or  hilar mass or adenopathy. The esophagus is grossly normal. Thyroid gland is unremarkable. Lungs/Pleura: No acute pulmonary findings. No worrisome pulmonary lesions. No pleural effusion. Musculoskeletal: The bony thorax is intact. Review of the MIP images confirms the above findings. CTA ABDOMEN AND PELVIS FINDINGS VASCULAR Aorta: The aorta demonstrates scattered atherosclerotic calcifications and mild tortuosity. No dissection or focal aneurysm. Celiac: Minimal ostial calcifications.  The vessel is widely patent. SMA: Normal Renals: There are 2 right and 2 left renal arteries. Both of the right renal arteries are quite irregular and may be somewhat beaded. Could not exclude FMD. IMA: Patent Inflow: Scattered atherosclerotic calcifications but no focal aneurysm or dissection. Veins: No significant findings. Review of the MIP images confirms the above findings. NON-VASCULAR Hepatobiliary: No hepatic lesions are identified. No intrahepatic biliary dilatation. The gallbladder is grossly normal. No common bile duct dilatation. Pancreas: No mass, inflammation or ductal dilatation. Spleen: Normal size.  No focal lesions. Adrenals/Urinary Tract: The adrenal glands are unremarkable. The left kidney is unremarkable except for parapelvic cysts. There is a large right-sided perinephric hematoma with mass effect on the right kidney. The hematoma measures approximately 11.5 x 10.0 x 9.0 cm with volume estimated at approximately 500 cc. There is evidence of active extravasation from the midpole lower pole junction region laterally (image 125/7). I do not see an obvious mass there could be an underlying lesion that bled. No history of trauma. Patient is on Eliquis for AFib. The bladder is unremarkable. Stomach/Bowel: The stomach, duodenum, small bowel and colon are grossly normal without oral contrast. No inflammatory changes, mass lesions or obstructive findings. The appendix is normal. Lymphatic: No mesenteric or retroperitoneal  mass or lymphadenopathy. Reproductive: The prostate gland and seminal vesicles are unremarkable. Other: No pelvic mass or adenopathy. No free pelvic fluid collections. No inguinal mass or adenopathy. No abdominal wall hernia or subcutaneous lesions. Musculoskeletal: No significant bony findings. Review of the MIP images confirms the above findings. IMPRESSION: 1. Large right-sided perinephric hematoma with evidence of active extravasation from the midpole lower pole junction region laterally. The hematoma measures approximately 11.5 x 10.0 x 9.0 cm with mass effect on the right kidney. No obvious underlying mass is identified. Recommend interventional radiology consultation. 2. No thoracic or abdominal  aortic aneurysm or dissection. 3. Irregular and beaded appearance of the right renal arteries could not exclude FMD. There are 2 right and 2 left renal arteries. 4. No acute pulmonary findings or worrisome pulmonary lesions. 5. Aortic atherosclerosis. These results were called by telephone at the time of interpretation on 08/19/2020 at 11:53 am to provider [facility] , who verbally acknowledged these results. Electronically Signed   By: Marijo Sanes M.D.   On: 08/19/2020 11:53   ECHOCARDIOGRAM LIMITED  Result Date: 08/19/2020    ECHOCARDIOGRAM LIMITED REPORT   Patient Name:   OSMIN WELZ Date of Exam: 08/19/2020 Medical Rec #:  [id number]       Height:       73.0 in Accession #:    [id number]      Weight:       184.0 lb Date of Birth:  05-Oct-1950       BSA:          2.077 m Patient Age:    71 years        BP:           104/59 mmHg Patient Gender: M               HR:           68 bpm. Exam Location:  Inpatient Procedure: Limited Echo and Intracardiac Opacification Agent Indications:    Syncope R55  History:        Patient has prior history of Echocardiogram examinations, most                 recent 10/25/2018. CHF; Risk Factors:Hypertension and                 Dyslipidemia. Intermittent asthma, presented with  sudden onset                 severe right back pain and syncope. Right-sided perinephric                 hematoma with mass effect                 on the right kidney.  Sonographer:    Darlina Sicilian RDCS Referring Phys: 2 RHONDA G BARRETT  Sonographer Comments: Technically difficult study due to poor echo windows. Unable to move without extreeme nausea.  FINDINGS  Left Ventricle: Definity contrast agent was given IV to delineate the left ventricular endocardial borders. Unfortunately, the images are not interpretable. Suggest repeat evaluation when the patient is able to cooperate with the study. Sanda Klein MD Electronically signed by Sanda Klein MD Signature Date/Time: 08/19/2020/3:19:26 PM    Final     Cardiac Studies     Patient Profile     70 y.o. male with PAF , admitted with sudden back pain and retroperitoneal bleed.   Assessment & Plan    1  Retrperitoneal hematoma:   is being seen by IR.   Further plans per IR   2.  PAF:  cont Tikosyn.  Eliquis is on hold He is not eager to restart eliquis.  Will discuss this further after he is over this retroperitoeal bleed episode          For questions or updates, please contact [place] Please consult [email or web address] for contact info under        Signed, Mertie Moores, MD  08/21/2020, 9:51 AM

## 2020-08-22 ENCOUNTER — Encounter: Payer: Self-pay | Admitting: Family Medicine

## 2020-08-22 ENCOUNTER — Other Ambulatory Visit (HOSPITAL_COMMUNITY): Payer: Self-pay

## 2020-08-22 DIAGNOSIS — K661 Hemoperitoneum: Secondary | ICD-10-CM

## 2020-08-22 LAB — CBC WITH DIFFERENTIAL/PLATELET
Abs Immature Granulocytes: 0.02 10*3/uL (ref 0.00–0.07)
Basophils Absolute: 0.1 10*3/uL (ref 0.0–0.1)
Basophils Relative: 1 %
Eosinophils Absolute: 0.2 10*3/uL (ref 0.0–0.5)
Eosinophils Relative: 4 %
HCT: 30 % — ABNORMAL LOW (ref 39.0–52.0)
Hemoglobin: 9.8 g/dL — ABNORMAL LOW (ref 13.0–17.0)
Immature Granulocytes: 0 %
Lymphocytes Relative: 22 %
Lymphs Abs: 1.3 10*3/uL (ref 0.7–4.0)
MCH: 30.2 pg (ref 26.0–34.0)
MCHC: 32.7 g/dL (ref 30.0–36.0)
MCV: 92.6 fL (ref 80.0–100.0)
Monocytes Absolute: 0.6 10*3/uL (ref 0.1–1.0)
Monocytes Relative: 10 %
Neutro Abs: 3.9 10*3/uL (ref 1.7–7.7)
Neutrophils Relative %: 63 %
Platelets: 136 10*3/uL — ABNORMAL LOW (ref 150–400)
RBC: 3.24 MIL/uL — ABNORMAL LOW (ref 4.22–5.81)
RDW: 13.1 % (ref 11.5–15.5)
WBC: 6.1 10*3/uL (ref 4.0–10.5)
nRBC: 0 % (ref 0.0–0.2)

## 2020-08-22 LAB — COMPREHENSIVE METABOLIC PANEL
ALT: 23 U/L (ref 0–44)
AST: 34 U/L (ref 15–41)
Albumin: 2.6 g/dL — ABNORMAL LOW (ref 3.5–5.0)
Alkaline Phosphatase: 36 U/L — ABNORMAL LOW (ref 38–126)
Anion gap: 5 (ref 5–15)
BUN: 11 mg/dL (ref 8–23)
CO2: 28 mmol/L (ref 22–32)
Calcium: 8.1 mg/dL — ABNORMAL LOW (ref 8.9–10.3)
Chloride: 107 mmol/L (ref 98–111)
Creatinine, Ser: 0.89 mg/dL (ref 0.61–1.24)
GFR, Estimated: >60 mL/min (ref >60)
Glucose, Bld: 109 mg/dL — ABNORMAL HIGH (ref 70–99)
Potassium: 3.6 mmol/L (ref 3.5–5.1)
Sodium: 140 mmol/L (ref 135–145)
Total Bilirubin: 0.7 mg/dL (ref 0.3–1.2)
Total Protein: 4.7 g/dL — ABNORMAL LOW (ref 6.5–8.1)

## 2020-08-22 LAB — MAGNESIUM: Magnesium: 1.8 mg/dL (ref 1.7–2.4)

## 2020-08-22 MED ORDER — METOPROLOL SUCCINATE ER 50 MG PO TB24
100.0000 mg | ORAL_TABLET | Freq: Every day | ORAL | Status: DC
Start: 2020-08-22 — End: 2020-08-26

## 2020-08-22 MED ORDER — POTASSIUM CHLORIDE CRYS ER 20 MEQ PO TBCR
40.0000 meq | EXTENDED_RELEASE_TABLET | Freq: Once | ORAL | Status: DC
  Filled 2020-08-22: qty 2

## 2020-08-22 MED ORDER — OXYCODONE-ACETAMINOPHEN 5-325 MG PO TABS: 1.0000 | ORAL_TABLET | ORAL | 0 refills | Status: DC | PRN

## 2020-08-22 MED ORDER — MAGNESIUM OXIDE 400 MG PO TABS
400.0000 mg | ORAL_TABLET | Freq: Two times a day (BID) | ORAL | 0 refills | Status: AC
  Filled 2020-08-22: qty 14, 7d supply, fill #0

## 2020-08-22 MED ORDER — POTASSIUM CHLORIDE CRYS ER 20 MEQ PO TBCR
40.0000 meq | EXTENDED_RELEASE_TABLET | Freq: Every day | ORAL | 0 refills | Status: DC
  Filled 2020-08-22: qty 14, 7d supply, fill #0

## 2020-08-22 NOTE — Progress Notes (Signed)
Chart check:  Patient appears hemodynamically stable, hgb and VS stable.   Will delete the IR Rad eval order, please call IR for questions and concerns.    Lilianne Delair H Dovie Kapusta PA-C 08/22/2020 10:30 AM

## 2020-08-22 NOTE — Progress Notes (Signed)
Progress Note  Patient Name: Terry [name] Date of Encounter: 08/22/2020  [facility] Cardiologist: Mertie Moores, MD  Subjective   70 yo admitted with acute onset of back pain and subsequent syncope. Has PAF and was on eliquis  [name] remains stable   Hemoglobin is up slightly to 9.8.  He is having minimal abdominal pain.  Vital signs are stable.  Inpatient Medications    Scheduled Meds:  sodium chloride   Intravenous Once   acidophilus   Oral Daily   dofetilide  250 mcg Oral BID   fluticasone  2 spray Each Nare QPM   magnesium oxide  400 mg Oral BID   montelukast  10 mg Oral QHS   pantoprazole  40 mg Oral Daily   potassium chloride  40 mEq Oral Once   rosuvastatin  5 mg Oral Daily   Continuous Infusions:  lactated ringers 10 mL/hr at 08/21/20 1730   PRN Meds: acetaminophen **OR** acetaminophen, albuterol, HYDROmorphone (DILAUDID) injection, ondansetron (ZOFRAN) IV   Vital Signs    Vitals:   08/21/20 0813 08/21/20 1240 08/21/20 1935 08/21/20 2357  BP: 140/83 123/75 125/81 132/81  Pulse: 61 65 71 60  Resp: 14 17  19   Temp: 98.3 F (36.8 C) 98.6 F (37 C) 98.5 F (36.9 C) 97.7 F (36.5 C)  TempSrc: Oral Oral Oral Oral  SpO2: 97% 95% 98% 98%  Weight:        Intake/Output Summary (Last 24 hours) at 08/22/2020 1306 Last data filed at 08/22/2020 0900 Gross per 24 hour  Intake 518.8 ml  Output 925 ml  Net -406.2 ml    Last 3 Weights 08/20/2020 08/19/2020 08/19/2020  Weight (lbs) 198 lb 13.7 oz 184 lb 184 lb  Weight (kg) 90.2 kg 83.462 kg 83.462 kg      Telemetry    NSR  - Personally Reviewed  ECG     - Personally Reviewed  Physical Exam   Physical Exam: Blood pressure 132/81, pulse 60, temperature 97.7 F (36.5 C), temperature source Oral, resp. rate 19, weight 90.2 kg, SpO2 98 %.  GEN:  middle age male, NAD  HEENT: Normal NECK: No JVD; No carotid bruits LYMPHATICS: No lymphadenopathy CARDIAC: RRR  RESPIRATORY:  Clear to auscultation without  rales, wheezing or rhonchi  ABDOMEN: Soft,, mildly tender  MUSCULOSKELETAL:  No edema; No deformity  SKIN: Warm and dry NEUROLOGIC:  Alert and oriented x 3   Labs    High Sensitivity Troponin:   Recent Labs  Lab 08/19/20 1020 08/19/20 1226  TROPONINIHS 6 8       Chemistry Recent Labs  Lab 08/19/20 1020 08/20/20 0326 08/21/20 0039 08/22/20 0035  NA 137 138 139 140  K 4.1 4.2 4.0 3.6  CL 108 107 107 107  CO2 23 23 28 28   GLUCOSE 171* 123* 107* 109*  BUN 16 20 13 11   CREATININE 1.29* 1.30* 1.14 0.89  CALCIUM 8.3* 8.2* 7.9* 8.1*  PROT 5.4*  --  4.4* 4.7*  ALBUMIN 3.2*  --  2.6* 2.6*  AST 21  --  34 34  ALT 21  --  18 23  ALKPHOS 42  --  33* 36*  BILITOT 1.0  --  0.6 0.7  GFRNONAA >60 >60 >60 >60  ANIONGAP 6 8 4* 5      Hematology Recent Labs  Lab 08/20/20 0326 08/20/20 0730 08/20/20 1204 08/21/20 0039 08/22/20 0035  WBC 10.7*  --   --  7.2 6.1  RBC 3.42*  --   --  3.15* 3.24*  HGB 10.4*   < > 10.3* 9.5* 9.8*  HCT 33.0*   < > 32.8* 29.7* 30.0*  MCV 96.5  --   --  94.3 92.6  MCH 30.4  --   --  30.2 30.2  MCHC 31.5  --   --  32.0 32.7  RDW 13.5  --   --  13.3 13.1  PLT 170  --   --  138* 136*   < > = values in this interval not displayed.     BNPNo results for input(s): BNP, PROBNP in the last 168 hours.   DDimer No results for input(s): DDIMER in the last 168 hours.   Radiology    No results found.  Cardiac Studies     Patient Profile     70 y.o. male with PAF , admitted with sudden back pain and retroperitoneal bleed.   Assessment & Plan    1  Retrperitoneal hematoma:   I seems to have stabilized. We are holding the eliquis for now.       2.  PAF:  cont Tikosyn.  Eliquis is on hold He is not eager to restart eliquis.  Will discuss this further after he is over this retroperitoeal bleed episode Terry [name] and his wife are not sure they want to restart , even once the retroperitoneal hematoma has improved.  We will see him back in 4-6  weeks and have that discussion . They both realize that full dose eliquis will provide the best protection against having a stroke   Options could include,  1 - no anticoagulation  2 - ASA 81 mg a day  3 - Eliquis 2.5 mg BID 4. Eliquis 5 mg BID    Ok for DC today .       For questions or updates, please contact [place] Please consult [email or web address] for contact info under        Signed, Mertie Moores, MD  08/22/2020, 1:06 PM

## 2020-08-22 NOTE — Plan of Care (Signed)

## 2020-08-22 NOTE — Discharge Summary (Signed)
Physician Discharge Summary  Terry [name] MOQ:[id number] DOB: 1950/03/23 DOA: 08/19/2020  PCP: Lawerance Cruel, MD  Admit date: 08/19/2020 Discharge date: 08/22/2020  Time spent: 47 minutes  Recommendations for Outpatient Follow-up:  Chem-12, CBC, magnesium 1 week at PCP office Outpatient follow-up to be arranged by family with Dr. Alexis Frock Cardiology will set up a TOC visit I will CC Dr. Melinda Crutch on this discharge summary for care coordination purposes and outpatient close  Absolutely no non-steroidal-Or aspirin until reviewed by urologist follow-up   Discharge Diagnoses:  MAIN problem for hospitalization   11 X 10 X9 perinephric hematoma which was spontaneous   Please see below for itemized issues addressed in [place]- refer to other progress notes for clarity if needed  Discharge Condition: improved  Diet recommendation: heart healthy  Filed Weights   08/19/20 1103 08/19/20 1159 08/20/20 1418  Weight: 83.5 kg 83.5 kg 90.2 kg    History of present illness:   70 year old extremely active white male (still bicycling) CAD status post cardiac cath?  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 fib Rx/Tikosyn at 01/17/2018 admission CHA2DS2-VASc score?  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 (58-50% 10/2018) remote stroke (at age 78 with central retinal venous occlusion status post laser therapy) Prior closed head injury 2003 right wrist fracture (fall and multiple injuries)  Presented to the emergency room syncopal episode weak lethargic and then syncopal Experienced 10/10 lower back pain with lightheadedness-witnessed syncopal episode in ED with blood pressure dropping into the 80s and CPR performed CTA chest abdomen pelvis = large right-sided perinephric hematoma with mass-effect right kidney 11.5 X10.0X 9.0 Hemoglobin 14-->12.9-->10 ED Rx Jennette   Cardiology consulted rology consulted Eventually IR consulted  Hospital Course:   11.5 X 10.0 X  9.0 right-sided perinephric hematoma with mass-effect IR evaluation appreciated- renal embolization/angiography on hold given hemodynamic stability Not currently ready for discharge Urology Dr. Abner Greenspan and I discussed case on 08/21/2020--recommend no heavy lifting or exercise for 4-6 weeks and to follow up in about 1 month with Dr.Manny with a CT scan in Urology office I have placed urology number etc. in the chart and will care coordinate with PCP Dr. Harrington Challenger and Dr. Acie Fredrickson for outpatient follow-up Acute hypovolemic shock associated with acute blood loss anemia secondary to perinephric hematoma Kcentra given on admit Shock physiology resolved--patient did not need any transfusions this admission Transition pain meds as needed to orals-discontinue Dilaudid--imited Rx of Percocet given on discharge Permanent A. fib on Tikosyn, CHA2DS2-VASc >5, has bled >3 [prior stroke?  Age 21 with central retinal venous occlusion] Shared decision making with Dr. Acie Fredrickson of cardiology anticoagulation versus risk of stroke to him in the outpatient setting in about 4 to 6 weeks after CT scan Continue Tikosyn 250 twice daily-resume beta-blocker at lower dose on discharge Mild AKI on admission Resolved  Procedures: CT scan abdomen pelvis   Consultations: urologist Dr. Alexis Frock Cardiology Dr. Mertie Moores   Discharge Exam: Vitals:   08/21/20 1935 08/21/20 2357  BP: 125/81 132/81  Pulse: 71 60  Resp:  19  Temp: 98.5 F (36.9 C) 97.7 F (36.5 C)  SpO2: 98% 98%    Subj on day of d/c   Well coherent no distress 1-to 10/10 pain no distress no bleeding no lightheadedness no nausea no vomiting eating drinking ambulatory  General Exam on discharge  Awake alert coherent EOMI NCAT no focal deficit Chest clear no rales no rhonchi no adventitious sound S1-S2 no murmur predominantly sinus rhythm on monitor  60 range Neck soft supple Absolutely no tenderness in abdomen no rebound no guarding   Discharge  Instructions   Discharge Instructions     Diet - low sodium heart healthy   Complete by: As directed    Discharge instructions   Complete by: As directed    Please look at the medications carefully as several have changed You have been taken off of your blood thinner completely and you should not use this for the foreseeable future as has been discussed--- we will give you the name and address of Dr. Alexis Frock who will probably need to follow-up with you within 4 to 6 weeks for repeat CT scan (to be scheduled by them) to determine if you have any further bleeding or if the bleeding from your kidney has decreased You will also require subsequently an outpatient discussion with Dr. Acie Fredrickson of cardiology who will have a risk-benefit discussion with you about the use of Eliquis Your potassium was a little low so we have changed your doses of potassium and magnesium and you will need lab follow-up in the outpatient setting with Dr. Melinda Crutch I would recommend nothing more strenuous than lifting a jug of milk and absolutely no strenuous exercise until you are cleared for this by Dr. Tresa Moore It would also be a good idea for you to discuss with work your medications and time off and I will give you a work letter excusing you from work for at least 1 month and this can be extended by Dr. Harrington Challenger as he sees fit All of the specialists will get a copy of my discharge paperwork and that they should have some idea we will coordinate with you for further planning and care Take care Best of luck it was a pleasure meeting you   Increase activity slowly   Complete by: As directed       Allergies as of 08/22/2020       Reactions   Losartan Other (See Comments)   Makes BP drop very low   Penicillins Other (See Comments)   Childhood reaction Has patient had a PCN reaction causing immediate rash, facial/tongue/throat swelling, SOB or lightheadedness with hypotension: yes Has patient had a PCN reaction causing  severe rash involving mucus membranes or skin necrosis: no Has patient had a PCN reaction that required hospitalization: no Has patient had a PCN reaction occurring within the last 10 years: no If all of the above answers are "NO", then may proceed with Cephalosporin use.        Medication List     STOP taking these medications    Eliquis 5 MG Tabs tablet Generic drug: apixaban   Magnesium Oxide 400 MG Caps   potassium chloride 10 MEQ tablet Commonly known as: KLOR-CON   tadalafil 20 MG tablet Commonly known as: CIALIS       TAKE these medications    albuterol 108 (90 Base) MCG/ACT inhaler Commonly known as: VENTOLIN HFA Inhale 2 puffs into the lungs every 4 (four) hours as needed for wheezing or shortness of breath.   dofetilide 250 MCG capsule Commonly known as: TIKOSYN Take 1 capsule (250 mcg total) by mouth 2 (two) times daily.   fluticasone 50 MCG/ACT nasal spray Commonly known as: FLONASE Place 2 sprays into both nostrils every evening.   magnesium oxide 400 (240 Mg) MG tablet Commonly known as: MAG-OX Take 1 tablet (400 mg total) by mouth 2 (two) times daily for 7 days.   metoprolol succinate 50  MG 24 hr tablet Commonly known as: TOPROL-XL Take 2 tablets (100 mg total) by mouth daily. Take with or immediately following a meal. What changed: medication strength   oxyCODONE-acetaminophen 5-325 MG tablet Commonly known as: Percocet Take 1 tablet by mouth every 4 (four) hours as needed for severe pain.   potassium chloride SA 20 MEQ tablet Commonly known as: KLOR-CON Take 2 tablets (40 mEq total) by mouth daily for 7 days.   PROBIOTIC DAILY PO Take 1 capsule by mouth daily.   rosuvastatin 5 MG tablet Commonly known as: CRESTOR Take 5 mg by mouth every evening.       Allergies  Allergen Reactions   Losartan Other (See Comments)    Makes BP drop very low   Penicillins Other (See Comments)    Childhood reaction Has patient had a PCN reaction  causing immediate rash, facial/tongue/throat swelling, SOB or lightheadedness with hypotension: yes Has patient had a PCN reaction causing severe rash involving mucus membranes or skin necrosis: no Has patient had a PCN reaction that required hospitalization: no Has patient had a PCN reaction occurring within the last 10 years: no If all of the above answers are "NO", then may proceed with Cephalosporin use.     Follow-up Information     Alexis Frock, MD. Schedule an appointment as soon as possible for a visit.   Specialty: Urology Contact information: [place] [address] [phone]         Nahser, Wonda Cheng, MD Follow up in 1 month(s).   Specialty: Cardiology Contact information: [place] [address] [phone]         Lawerance Cruel, MD Follow up in 1 week(s).   Specialty: Family Medicine Contact information: [place] [address] [phone]                  The results of significant diagnostics from this hospitalization (including imaging, microbiology, ancillary and laboratory) are listed below for reference.    Significant Diagnostic Studies: CT Angio Chest/Abd/Pel for Dissection W and/or W/WO  Result Date: 08/19/2020 CLINICAL DATA:  Acute onset right back and flank pain. EXAM: CT ANGIOGRAPHY CHEST, ABDOMEN AND PELVIS TECHNIQUE: Non-contrast CT of the chest was initially obtained. Multidetector CT imaging through the chest, abdomen and pelvis was performed using the standard protocol during bolus administration of intravenous contrast. Multiplanar reconstructed images and MIPs were obtained and reviewed to evaluate the vascular anatomy. CONTRAST:  158mL OMNIPAQUE IOHEXOL 350 MG/ML SOLN COMPARISON:  None. FINDINGS: CTA CHEST FINDINGS Cardiovascular: The heart is normal in size. No pericardial effusion. There is mild tortuosity calcification of the thoracic aorta but no focal aneurysm  or dissection. The branch vessels are patent. No obvious coronary artery calcifications. Mediastinum/Nodes: No mediastinal or hilar mass or adenopathy. The esophagus is grossly normal. Thyroid gland is unremarkable. Lungs/Pleura: No acute pulmonary findings. No worrisome pulmonary lesions. No pleural effusion. Musculoskeletal: The bony thorax is intact. Review of the MIP images confirms the above findings. CTA ABDOMEN AND PELVIS FINDINGS VASCULAR Aorta: The aorta demonstrates scattered atherosclerotic calcifications and mild tortuosity. No dissection or focal aneurysm. Celiac: Minimal ostial calcifications.  The vessel is widely patent. SMA: Normal Renals: There are 2 right and 2 left renal arteries. Both of the right renal arteries are quite irregular and may be somewhat beaded. Could not exclude FMD. IMA: Patent Inflow: Scattered atherosclerotic calcifications but no focal aneurysm or dissection. Veins: No significant findings. Review  of the MIP images confirms the above findings. NON-VASCULAR Hepatobiliary: No hepatic lesions are identified. No intrahepatic biliary dilatation. The gallbladder is grossly normal. No common bile duct dilatation. Pancreas: No mass, inflammation or ductal dilatation. Spleen: Normal size.  No focal lesions. Adrenals/Urinary Tract: The adrenal glands are unremarkable. The left kidney is unremarkable except for parapelvic cysts. There is a large right-sided perinephric hematoma with mass effect on the right kidney. The hematoma measures approximately 11.5 x 10.0 x 9.0 cm with volume estimated at approximately 500 cc. There is evidence of active extravasation from the midpole lower pole junction region laterally (image 125/7). I do not see an obvious mass there could be an underlying lesion that bled. No history of trauma. Patient is on Eliquis for AFib. The bladder is unremarkable. Stomach/Bowel: The stomach, duodenum, small bowel and colon are grossly normal without oral contrast. No  inflammatory changes, mass lesions or obstructive findings. The appendix is normal. Lymphatic: No mesenteric or retroperitoneal mass or lymphadenopathy. Reproductive: The prostate gland and seminal vesicles are unremarkable. Other: No pelvic mass or adenopathy. No free pelvic fluid collections. No inguinal mass or adenopathy. No abdominal wall hernia or subcutaneous lesions. Musculoskeletal: No significant bony findings. Review of the MIP images confirms the above findings. IMPRESSION: 1. Large right-sided perinephric hematoma with evidence of active extravasation from the midpole lower pole junction region laterally. The hematoma measures approximately 11.5 x 10.0 x 9.0 cm with mass effect on the right kidney. No obvious underlying mass is identified. Recommend interventional radiology consultation. 2. No thoracic or abdominal aortic aneurysm or dissection. 3. Irregular and beaded appearance of the right renal arteries could not exclude FMD. There are 2 right and 2 left renal arteries. 4. No acute pulmonary findings or worrisome pulmonary lesions. 5. Aortic atherosclerosis. These results were called by telephone at the time of interpretation on 08/19/2020 at 11:53 am to provider [facility] , who verbally acknowledged these results. Electronically Signed   By: Marijo Sanes M.D.   On: 08/19/2020 11:53   ECHOCARDIOGRAM LIMITED  Result Date: 08/19/2020    ECHOCARDIOGRAM LIMITED REPORT   Patient Name:   Terry [name] Date of Exam: 08/19/2020 Medical Rec #:  [id number]       Height:       73.0 in Accession #:    [id number]      Weight:       184.0 lb Date of Birth:  November 08, 1950       BSA:          2.077 m Patient Age:    27 years        BP:           104/59 mmHg Patient Gender: M               HR:           68 bpm. Exam Location:  Inpatient Procedure: Limited Echo and Intracardiac Opacification Agent Indications:    Syncope R55  History:        Patient has prior history of Echocardiogram examinations, most                  recent 10/25/2018. CHF; Risk Factors:Hypertension and                 Dyslipidemia. Intermittent asthma, presented with sudden onset                 severe right back pain and syncope. Right-sided perinephric  hematoma with mass effect                 on the right kidney.  Sonographer:    Darlina Sicilian RDCS Referring Phys: [id number] RHONDA G BARRETT  Sonographer Comments: Technically difficult study due to poor echo windows. Unable to move without extreeme nausea.  FINDINGS  Left Ventricle: Definity contrast agent was given IV to delineate the left ventricular endocardial borders. Unfortunately, the images are not interpretable. Suggest repeat evaluation when the patient is able to cooperate with the study. Sanda Klein MD Electronically signed by Sanda Klein MD Signature Date/Time: 08/19/2020/3:19:26 PM    Final     Microbiology: Recent Results (from the past 240 hour(s))  SARS CORONAVIRUS 2 (TAT 6-24 HRS) Nasopharyngeal Nasopharyngeal Swab     Status: None   Collection Time: 08/19/20  1:58 PM   Specimen: Nasopharyngeal Swab  Result Value Ref Range Status   SARS Coronavirus 2 NEGATIVE NEGATIVE Final    Comment: (NOTE) SARS-CoV-2 target nucleic acids are NOT DETECTED.  The SARS-CoV-2 RNA is generally detectable in upper and lower respiratory specimens during the acute phase of infection. Negative results do not preclude SARS-CoV-2 infection, do not rule out co-infections with other pathogens, and should not be used as the sole basis for treatment or other patient management decisions. Negative results must be combined with clinical observations, patient history, and epidemiological information. The expected result is Negative.  Fact Sheet for Patients: SugarRoll.be  Fact Sheet for Healthcare Providers: https://www.woods-mathews.com/  This test is not yet approved or cleared by the [place] FDA and  has been authorized for  detection and/or diagnosis of SARS-CoV-2 by FDA under an Emergency Use Authorization (EUA). This EUA will remain  in effect (meaning this test can be used) for the duration of the COVID-19 declaration under Se ction 564(b)(1) of the Act, 21 U.S.C. section 360bbb-3(b)(1), unless the authorization is terminated or revoked sooner.  Performed at [facility], [place] [address], [place], [place] [address]      Labs: Basic Metabolic Panel: Recent Labs  Lab 08/19/20 1020 08/19/20 1444 08/20/20 0326 08/21/20 0039 08/22/20 0035  NA 137  --  138 139 140  K 4.1  --  4.2 4.0 3.6  CL 108  --  107 107 107  CO2 23  --  23 28 28   GLUCOSE 171*  --  123* 107* 109*  BUN 16  --  20 13 11   CREATININE 1.29*  --  1.30* 1.14 0.89  CALCIUM 8.3*  --  8.2* 7.9* 8.1*  MG  --  1.9  --   --   --    Liver Function Tests: Recent Labs  Lab 08/19/20 1020 08/21/20 0039 08/22/20 0035  AST 21 34 34  ALT 21 18 23   ALKPHOS 42 33* 36*  BILITOT 1.0 0.6 0.7  PROT 5.4* 4.4* 4.7*  ALBUMIN 3.2* 2.6* 2.6*   Recent Labs  Lab 08/19/20 1020  LIPASE 34   No results for input(s): AMMONIA in the last 168 hours. CBC: Recent Labs  Lab 08/19/20 1020 08/19/20 1226 08/20/20 0326 08/20/20 0730 08/20/20 1204 08/21/20 0039 08/22/20 0035  WBC 9.2  --  10.7*  --   --  7.2 6.1  NEUTROABS 7.4  --   --   --   --  4.9 3.9  HGB 14.7   < > 10.4* 10.2* 10.3* 9.5* 9.8*  HCT 45.0   < > 33.0* 32.1* 32.8* 29.7* 30.0*  MCV 93.2  --  96.5  --   --  94.3 92.6  PLT 195  --  170  --   --  138* 136*   < > = values in this interval not displayed.   Cardiac Enzymes: No results for input(s): CKTOTAL, CKMB, CKMBINDEX, TROPONINI in the last 168 hours. BNP: BNP (last 3 results) No results for input(s): BNP in the last 8760 hours.  ProBNP (last 3 results) No results for input(s): PROBNP in the last 8760 hours.  CBG: Recent Labs  Lab 08/19/20 1057  GLUCAP 150*       Signed:  Nita Sells MD   [facility] 08/22/2020, 10:39 AM

## 2020-08-22 NOTE — Progress Notes (Deleted)
Pt's heart rate did correct on its own without medication. Pt was ordered 1x dose of IV Cardizem last evenimg, however medication was not given. Pt did receive his scheduled medication and has had not further episodes of Tachycardia. Will continue to monitor and tx as indicated.

## 2020-08-23 LAB — TYPE AND SCREEN
ABO/RH(D): A POS
Antibody Screen: NEGATIVE
Unit division: 0
Unit division: 0

## 2020-08-23 LAB — BPAM RBC
Blood Product Expiration Date: 202206292359
Blood Product Expiration Date: 202206292359
Unit Type and Rh: 6200
Unit Type and Rh: 6200

## 2020-08-25 ENCOUNTER — Telehealth: Payer: Self-pay | Admitting: Student in an Organized Health Care Education/Training Program

## 2020-08-25 ENCOUNTER — Telehealth: Payer: Self-pay | Admitting: Cardiovascular Disease

## 2020-08-25 NOTE — Telephone Encounter (Signed)
Pt c/o medication issue:  1. Name of Medication: metoprolol succinate (TOPROL-XL) 50 MG 24 hr tablet  2. How are you currently taking this medication (dosage and times per day)? 1 tablet a day  3. Are you having a reaction (difficulty breathing--STAT)? no  4. What is your medication issue? When he went to the hospital on his discharge papers that have him taking  2 50mg  a day. But his blood pressure is low there what it normally is. What to make sure he is taking the right dosage since there I two different instruction.

## 2020-08-25 NOTE — Telephone Encounter (Signed)
Paged by operator regarding patient question regarding BP meds.   Terry [name] was discharged 3 days ago (08/22/20) with syncopal episode and lethargy along with severe lower back pain and hypotension. CTA revealed large R sided perinephric hematoma and mass effect on R kidney with worsening anemia. He was given Kcentra and IR evaluated but did not have to intervene 2/2 hemodynamic stability. He was on eliquis for pAF. This was held in the setting of RP bleed and not resumed.   Spoke to wife and she had question regarding metoprolol. He was taking toprol XL 100 mg PO daily. Friday afternoon 110/77, Sa 130/80s, Sun 120/70s, this AM [phone] systolics. Prior BP range 100-110s/60-70s. Looks like he was being given toprol XL 50 mg tabs (x2) likely because it was held/fractionated earlier in hospitalization and then on dc paperwork reflected as a "change" medication which concerned the family given his recent presentation. He is asx and BP similar to prior clinic visits. No similar sx to prior presentation with syncope/back pain etc. Reassured family and asked them to call back if any sx occurred. Otherwise continue toprol XL 100 mg PO daily.

## 2020-08-26 MED ORDER — METOPROLOL SUCCINATE ER 50 MG PO TB24: 50.0000 mg | ORAL_TABLET | Freq: Every day | ORAL | 3 refills | Status: DC

## 2020-08-26 NOTE — Telephone Encounter (Signed)
RN spoke to Dr. Acie Fredrickson regarding medication changes. Due to low BP Dr. Acie Fredrickson would like to cut metoprolol succinate dosage to 50mg  daily. Patient verbalized understanding. Rn advised patient to hydrate and notify the office with any changes. Patient verbalized understanding.

## 2020-08-26 NOTE — Telephone Encounter (Signed)
RN returned call to patient and patient's wife. Patient states he still feels fatigued and his blood pressure is 107/63 this AM when he woke up and is now 85/54. Patients wife states before the medication was changed his BP was averaging in the 120's/70's. HR is averaging around 70. Patient and wife are concerned that medication dose needs to be adjusted. RN advised I would send the information to Dr.Nahser to review. Patient and wife verbalized understanding.

## 2020-09-27 ENCOUNTER — Encounter: Payer: Self-pay | Admitting: Cardiovascular Disease

## 2020-09-27 NOTE — Progress Notes (Addendum)
Ark Agrusa Date of Birth  [birth year] [facility] [address]    [place], [place]  [address] [phone]   Problem List 1. Hx of chest pain: Normal cath in 2002 [name])  A single plane ventriculogram revealed normal wall motion, ejection fraction  of approximately 65%. There was no gradient noted on pullback.  Fluoroscopy revealed no significant calcification.  Coronary angiography:  1. The left main coronary artery bifurcated into the left anterior descending  and circumflex vessel. There was no significant disease in the left main  coronary artery.  2. Left anterior descending: The left anterior descending artery gave rise to  a moderate sized D-1, a small D-2, and went on to end as an apical  recurrent branch. There was no significant disease in the left anterior  descending or its branches.  3. Circumflex vessel: The circumflex vessel was a large caliber vessel that  gave rise to a large branching obtuse marginal, then went on to end as an  AV groove vessel.  4. Right coronary artery: The right coronary artery was dominant for the  posterior circulation. The right coronary artery gave rise to two small  ______ marginals, a moderate sized PDA, and two large PL branches. There  was no significant disease in the right coronary artery or its branches.  2. Paroxysmal atrial fib 3. Essential HTN   Malcomb is a 70 year old gentleman with a history of hypertension and hyperlipidemia. He's done very well since I last saw him last year. He still cycling on a regular basis. He denies any episodes of syncope or presyncope.  Oct. 14, 2014:  Ardie was seen in the ER several weeks ago for chest pain.   On Sept. 24 - he awoke with CP.  He reports having some leg cramping earlier that night.  He has been lifting weights and does not think it was MSK.   The pain was mid sternal, radiated outward,  Pulsating like CP.  The pain lasted for several hours and  he went to the ED.  The pain resolved with Toradol.   He had some residual CP for then next several days.  He took motrin - the discomfort was gone after several days.    He has esopageal pain with  Eating dry rice, dry chicken.   Oct. 25, 2017:  Rondo is doing well BP is elevated.  Saw [name] several months ago.  Irbesartan was doubled.  His sleep study was negative.  Is not taking the Kdur - thinks it may be causig him gas  Had a TEE / Cardioversion in September 08, 2015:    BP is elevated .   Does not eat any salty foods.  Has slacked off on his cycling.   [facility]   Has a strong family hx of  HTN   Jan. 25, 2018:  Feeling well.   BP is much better on the Aldactone  Still at the [facility] regularly  Stopped taking the Kdur due to stomach ache.   January 05, 2018: Wilberth is seen back today for follow-up visit.  He is  He has been seen on numerous occasions by Sharrell Ku and more recently by Roderic Palau for persistent atrial fibrillation.  He feels much better in NSR compared to Afib  Has had 2 cardioversions The last cardioversion did not last very long  He saw Roderic Palau - she suggested [facility]   March 02, 2019:  Sargent is seen today for follow-up of his persistent Atrial fibrillation.  He has been seen in the atrial fibrillation clinic several times. Currently working from home.  ( works for [employer])  Is now on [place],  Has maintained NSR  Still exerercising .at home   Has developed a cough . Wondered if it was cardiac related.  His wife and daughter were very concerned that this might be a cardiac issue. I suggested that he start taking Pepcid Complete.  We will also try albuterol HFA as needed.  Jan. 27, 2022 Cove is seen today for follow up of his atrial fib. Has been on tikosyn, Needs Mag and BMP levels tomorrow ( lab is closed today )  Feels overall pretty good.  Difficulty sleeping . - likely due to stress   Difficulty sleeping    September 29, 2020 Ever is seen today for follow up of his Atrial fib Is on tikosyn He was admitted to the hospital in June, 2022 with acute onset of back pain and an episode of syncope while he was in the shower.  He was found to have a spontaneous retroperitoneal bleed. Eliquis was held. His baseline hb is ~ 16.   last Hb was 9.8 at the hospit. Now 12.7 at  Still off eliquis  BP has remained low .  Metoprolol dose has been reduced.   Has lost 10-15 lbs since the incident    Current Outpatient Medications on File Prior to Visit  Medication Sig Dispense Refill   albuterol (VENTOLIN HFA) 108 (90 Base) MCG/ACT inhaler Inhale 2 puffs into the lungs every 4 (four) hours as needed for wheezing or shortness of breath. 6.7 g 2   dofetilide (TIKOSYN) 250 MCG capsule Take 1 capsule (250 mcg total) by mouth 2 (two) times daily. 180 capsule 3   fluticasone (FLONASE) 50 MCG/ACT nasal spray Place 2 sprays into both nostrils every evening.      metoprolol succinate (TOPROL-XL) 25 MG 24 hr tablet Take 25 mg by mouth daily.     Probiotic Product (PROBIOTIC DAILY PO) Take 1 capsule by mouth daily.     rosuvastatin (CRESTOR) 5 MG tablet Take 5 mg by mouth every evening.     oxyCODONE-acetaminophen (PERCOCET) 5-325 MG tablet Take 1 tablet by mouth every 4 (four) hours as needed for severe pain. (Patient not taking: Reported on 09/29/2020) 20 tablet 0   No current facility-administered medications on file prior to visit.    Allergies  Allergen Reactions   Losartan Other (See Comments)    Makes BP drop very low   Penicillins Other (See Comments)    Childhood reaction Has patient had a PCN reaction causing immediate rash, facial/tongue/throat swelling, SOB or lightheadedness with hypotension: yes Has patient had a PCN reaction causing severe rash involving mucus membranes or skin necrosis: no Has patient had a PCN reaction that required hospitalization: no Has patient had a PCN  reaction occurring within the last 10 years: no If all of the above answers are "NO", then may proceed with Cephalosporin use.     Past Medical History:  Diagnosis Date   Aortic root dilation ([place])    a. mild by CT 10/2017.   Basal cell carcinoma 10/02/2019   left hand bcc nod.   Cardiomyopathy [facility])    Central retinal vein occlusion    Chronic systolic CHF (congestive heart failure) [facility])    ED (erectile dysfunction)    H/O cardiac catheterization 2002 - normal cors  HTN (hypertension)    Hyperlipidemia    Hypokalemia    Leg edema    LV dysfunction    a. EF appeared mildly depressed by TEE 08/2015, no % given.   Mini stroke [facility])    a. ? Age 50 at time of central retinal vein occlusion - lost vision in eye   PAF (paroxysmal atrial fibrillation) ([place])    a. Dx 06/2015, s/p TEE/DCCV 08/2015.   Pericarditis 2010   Syncope    Hx of thought to be due to orthostatic hypotension   Visit for monitoring Tikosyn therapy 01/17/2018    Past Surgical History:  Procedure Laterality Date   CARDIAC CATHETERIZATION  2002   CARDIOVERSION N/A 09/08/2015   Procedure: CARDIOVERSION;  Surgeon: Fay Records, MD;  Location: [facility];  Service: Cardiovascular;  Laterality: N/A;   CARDIOVERSION N/A 06/28/2017   Procedure: CARDIOVERSION;  Surgeon: Jerline Pain, MD;  Location: [facility] ENDOSCOPY;  Service: Cardiovascular;  Laterality: N/A;   HAND SURGERY  2003   KNEE SURGERY     NASAL SINUS SURGERY  2003   x2. Scar tissue build up and had to recreate his tear duct   PERICARDIECTOMY     TEE WITHOUT CARDIOVERSION N/A 09/08/2015   Procedure: TRANSESOPHAGEAL ECHOCARDIOGRAM (TEE);  Surgeon: Fay Records, MD;  Location: [facility] ENDOSCOPY;  Service: Cardiovascular;  Laterality: N/A;   TEE WITHOUT CARDIOVERSION N/A 06/28/2017   Procedure: TRANSESOPHAGEAL ECHOCARDIOGRAM (TEE);  Surgeon: Jerline Pain, MD;  Location: [facility] ENDOSCOPY;  Service: Cardiovascular;  Laterality: N/A;    Social History   Tobacco Use   Smoking Status Never  Smokeless Tobacco Never    Social History   Substance and Sexual Activity  Alcohol Use Not Currently   Comment: 2-3 Times a Week Wine with Meals    Family History  Problem Relation Age of Onset   Hypertension Mother    Diabetes Mother    Cancer Mother    Atrial fibrillation Father    Heart disease Father        had pacemaker   Hypertension Father    Diabetes Father     Reviw of Systems:  Reviewed in the HPI.  All other systems are negative.  Physical Exam: Blood pressure 112/78, pulse 72, height 6\' 1"  (1.854 m), weight 185 lb 9.6 oz (84.2 kg), SpO2 97 %.  GEN:  Well nourished, well developed in no acute distress HEENT: Normal NECK: No JVD; No carotid bruits LYMPHATICS: No lymphadenopathy CARDIAC: RRR , no murmurs, rubs, gallops RESPIRATORY:  Clear to auscultation without rales, wheezing or rhonchi  ABDOMEN: Soft, non-tender, non-distended MUSCULOSKELETAL:  No edema; No deformity  SKIN: Warm and dry NEUROLOGIC:  Alert and oriented x 3   ECG:    Assessment / Plan:   1. Paroxysmal atrial fibrillation:   Cont Tikosyn.  He is not on Eliquis yet   2.  Retroperitoneal hematoma: He had a CT angiogram after being admitted to the hospital in early June with an episode of syncope.  The CT revealed large right perinephric hematoma with evidence of extravasation.  He had a spontaneous   retroperitoneal hemorrhage.  Caused syncope  He will need a follow-up CT angiogram of his abdominal aorta with close attention to the kidneys.  If he still has ongoing blush or extravasation then he may need a surgical consultation.  My guess is that the hematoma has begun to resolve.  His hemoglobin seems to be stable. He will be seeing urology in the next  several weeks.     3.  Essential hypertension:  BP is well controlled.    4.    Chronic systolic congestive heart failure:  EF is mildly depressed.  EF 50-55%.  His last echo was not sufficient to show [place]  his LV function .  Will anticipate repeating it at a later time He is not having any symptoms     Mertie Moores, MD  09/29/2020 5:31 PM    [address],  [place], [place]  [address] Pager [phone] Phone: [phone]; Fax: [phone]

## 2020-09-29 ENCOUNTER — Ambulatory Visit: Payer: 59 | Admitting: Cardiovascular Disease

## 2020-09-29 ENCOUNTER — Encounter: Payer: Self-pay | Admitting: Cardiovascular Disease

## 2020-09-29 ENCOUNTER — Other Ambulatory Visit: Payer: Self-pay

## 2020-09-29 VITALS — BP 112/78 | HR 72 | Ht 73.0 in | Wt 185.6 lb

## 2020-09-29 DIAGNOSIS — I480 Paroxysmal atrial fibrillation: Secondary | ICD-10-CM | POA: Diagnosis not present

## 2020-09-29 DIAGNOSIS — K661 Hemoperitoneum: Secondary | ICD-10-CM

## 2020-09-29 DIAGNOSIS — I5022 Chronic systolic (congestive) heart failure: Secondary | ICD-10-CM | POA: Diagnosis not present

## 2020-09-29 NOTE — Patient Instructions (Signed)
Medication Instructions:  Your physician recommends that you continue on your current medications as directed. Please refer to the Current Medication list given to you today.  Labwork: None ordered.  Testing/Procedures: None ordered.  Follow-Up: Your physician recommends that you schedule a follow-up appointment in:   October 18 @ 1:30pm with Adline Peals, PA at the [facility] clinic  January 23 @ 1:20pm with Dr. Acie Fredrickson  Any Other Special Instructions Will Be Listed Below (If Applicable).     If you need a refill on your cardiac medications before your next appointment, please call your pharmacy.

## 2020-10-16 ENCOUNTER — Ambulatory Visit: Payer: 59 | Admitting: Cardiology

## 2020-10-16 ENCOUNTER — Other Ambulatory Visit: Payer: Self-pay

## 2020-10-16 ENCOUNTER — Encounter: Payer: Self-pay | Admitting: Cardiology

## 2020-10-16 VITALS — BP 129/83 | HR 63 | Temp 97.0°F | Resp 16 | Ht 73.0 in | Wt 185.8 lb

## 2020-10-16 DIAGNOSIS — Z5181 Encounter for therapeutic drug level monitoring: Secondary | ICD-10-CM

## 2020-10-16 DIAGNOSIS — I480 Paroxysmal atrial fibrillation: Secondary | ICD-10-CM

## 2020-10-16 DIAGNOSIS — Z79899 Other long term (current) drug therapy: Secondary | ICD-10-CM

## 2020-10-16 DIAGNOSIS — I77810 Thoracic aortic ectasia: Secondary | ICD-10-CM

## 2020-10-16 MED ORDER — DOFETILIDE 250 MCG PO CAPS: 250.0000 ug | ORAL_CAPSULE | Freq: Two times a day (BID) | ORAL | 3 refills | Status: DC

## 2020-10-16 MED ORDER — MAGNESIUM OXIDE -MG SUPPLEMENT 400 (240 MG) MG PO TABS: 400.0000 mg | ORAL_TABLET | Freq: Every day | ORAL | 3 refills | Status: DC

## 2020-10-16 NOTE — Progress Notes (Signed)
EKG 10/16/2020: Normal sinus rhythm at rate of 63 bpm, left atrial enlargement, otherwise normal EKG.

## 2020-10-16 NOTE — Progress Notes (Signed)
Primary Physician/Referring:  Lawerance Cruel, MD  Patient ID: Terry [name], male    DOB: 05-Dec-1950, 70 y.o.   MRN: [id number]  Chief Complaint  Patient presents with   Atrial Fibrillation   Hypertension   New Patient (Initial Visit)   HPI:    Terry [name]  is a 70 y.o. caucasian male with persistent atrial fibrillation on dofetilide and Eliquis, hypertension, chronic systolic and diastolc heart failure with recovered LVEF, aortic dilation at 4.0 cm, recurrent syncope, orthostatic hypotension, and retinal vein occlusion.   The patient was recently hospitalized with suspected vasovagal response due to severe right flank pain with subsequent findings of right renal hematoma. Blood pressure had dropped to 80/50 and hemoglobin trended downward during hospitalization but no transfusion was needed. Eliquis was discontinued and Kcentra was used to reverse blood thinning. Magnesium oxide, potassium chloride, and tadalafil were also stopped as part of discharge.  The patient presents today to receive a second opinion on Eliquis. The patient is concerned that the risk of Eliquis use outweigh the benefits now that he has experienced a significant bleeding event. The patient wishes to stop Eliquis. The patient is on Eliquis due to atrial fibrillation. Two cardioversions were performed that the patient states only put him back into NSR for a short period of time. Tikosyn was started after failed cardioversion. The patient reports that he has not had an episode of a-fib in 4 years since starting Tikosyn. His typical symptoms when he did have a-fib was extreme fatigue and dyspnea on exertion. He has not experienced these symptoms in recent years. The  The patient states that he has had significant weight loss in recent years after being diagnosed with cardiovascular problems. The patient states that he went from 225 lbs to 185 lbs now. He did this by cutting out wine and salt and continued exercise.  The patient states that his 100 mg Metoprolol was never decreased after his weight loss. Due to this, he began to experience brain fog and dizziness with Metoprolol use. His Metoprolol has been cut down to 25 mg by his PCP and the patient feels much better. Since having low BP in the hospital, the patient's BP has been slowly going up to normal. The patient and his wife keep a daily BP diary. BP Diary -  09/22/2020: 109/69 10/11/2020: 109/69 10/12/2020: 101/68 10/15/2020: 132/87, 126/81  The patient has not been performing physical activity recently due to activity restrictions after his bleeding event. He has done some light walking. Normally, the patient lifts weights and bicycles. The patient bikes for 30-50 miles a day on average.  The patient states that his right flank pain has improved and he only feels mild discomfort now. He denies chest pain, palpitations, shortness of breath, difficulty sleeping, orthopnea, sleep apnea, and fatigue. 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 He only performs leg elevation in the evenings to try to help his swelling.  [name].  Past Medical History:  Diagnosis Date   Aortic root dilation ([place])    a. mild by CT 10/2017.   Basal cell carcinoma 10/02/2019   left hand bcc nod.   Cardiomyopathy [facility])    Central retinal vein occlusion    Chronic systolic CHF (congestive heart failure) [facility])    ED (erectile dysfunction)    H/O cardiac catheterization 2002 - normal cors   HTN (hypertension)  Hyperlipidemia    Hypokalemia    Leg edema    LV dysfunction    a. EF appeared mildly depressed by TEE 08/2015, no % given.   Mini stroke [facility])    a. ? Age 50 at time of central retinal vein occlusion - lost vision in eye   PAF (paroxysmal atrial fibrillation) ([place])    a. Dx 06/2015, s/p TEE/DCCV 08/2015.   Pericarditis 2010   Syncope    Hx of  thought to be due to orthostatic hypotension   Visit for monitoring Tikosyn therapy 01/17/2018   Past Surgical History:  Procedure Laterality Date   CARDIAC CATHETERIZATION  2002   CARDIOVERSION N/A 09/08/2015   Procedure: CARDIOVERSION;  Surgeon: Fay Records, MD;  Location: [place];  Service: Cardiovascular;  Laterality: N/A;   CARDIOVERSION N/A 06/28/2017   Procedure: CARDIOVERSION;  Surgeon: Jerline Pain, MD;  Location: [facility] ENDOSCOPY;  Service: Cardiovascular;  Laterality: N/A;   HAND SURGERY  2003   KNEE SURGERY     NASAL SINUS SURGERY  2003   x2. Scar tissue build up and had to recreate his tear duct   PERICARDIECTOMY     TEE WITHOUT CARDIOVERSION N/A 09/08/2015   Procedure: TRANSESOPHAGEAL ECHOCARDIOGRAM (TEE);  Surgeon: Fay Records, MD;  Location: [facility] ENDOSCOPY;  Service: Cardiovascular;  Laterality: N/A;   TEE WITHOUT CARDIOVERSION N/A 06/28/2017   Procedure: TRANSESOPHAGEAL ECHOCARDIOGRAM (TEE);  Surgeon: Jerline Pain, MD;  Location: [facility] ENDOSCOPY;  Service: Cardiovascular;  Laterality: N/A;   Family History  Problem Relation Age of Onset   Hypertension Mother    Diabetes Mother    Cancer Mother    Atrial fibrillation Father    Heart disease Father        had pacemaker   Hypertension Father    Diabetes Father    Atrial fibrillation Brother    Heart attack Brother 85    Social History   Tobacco Use   Smoking status: Never   Smokeless tobacco: Never  Substance Use Topics   Alcohol use: Not Currently    Comment: 2-3 Times a Week Wine with Meals   Marital Status: Married  ROS  Review of Systems  Constitutional: Negative for malaise/fatigue.  Cardiovascular:  Positive for leg swelling (worse in the evenings). Negative for chest pain, claudication, dyspnea on exertion, near-syncope, orthopnea, palpitations, paroxysmal nocturnal dyspnea and syncope.  Respiratory:  Positive for snoring (according to his wife, sleep study negative). Negative for shortness of breath  and sleep disturbances due to breathing.   Gastrointestinal:  Negative for hematemesis, hematochezia and melena.  Genitourinary:  Negative for hematuria.  Neurological:  Negative for dizziness.  Objective  Blood pressure 129/83, pulse 63, temperature (!) 97 F (36.1 C), temperature source Temporal, resp. rate 16, height '6\' 1"'$  (1.854 m), weight 84.3 kg, SpO2 97 %. Body mass index is 24.51 kg/m.  Vitals with BMI 10/16/2020 09/29/2020 08/21/2020  Height '6\' 1"'$  '6\' 1"'$  -  Weight 185 lbs 13 oz 185 lbs 10 oz -  BMI [id number] -  Systolic [id number] [id number]  Diastolic 83 78 81  Pulse 63 72 60     Physical Exam Constitutional:      Appearance: Normal appearance. He is normal weight.  Cardiovascular:     Rate and Rhythm: Normal rate and regular rhythm.     Pulses: Normal pulses.     Heart sounds: Normal heart sounds. No murmur heard.   No gallop.     Comments: Varicosities noted  in bilateral lower extremities Pulmonary:     Effort: Pulmonary effort is normal. No respiratory distress.     Breath sounds: Normal breath sounds. No stridor. No wheezing or rales.  Abdominal:     General: Bowel sounds are normal.     Palpations: Abdomen is soft. There is no mass.     Tenderness: There is no abdominal tenderness. There is no guarding.  Musculoskeletal:        General: Swelling (1+ pitting edema below the knees bilaterally) present.  Neurological:     Mental Status: He is alert.  Psychiatric:        Mood and Affect: Mood normal.        Behavior: Behavior normal.     Laboratory examination:   Recent Labs    04/16/20 0735 05/02/20 0726 08/19/20 1020 08/20/20 0326 08/21/20 0039 08/22/20 0035  NA 140 140   < > 138 139 140  K 4.1 4.2   < > 4.2 4.0 3.6  CL 102 103   < > 107 107 107  CO2 27 24   < > '23 28 28  '$ GLUCOSE 94 95   < > 123* 107* 109*  BUN 17 16   < > '20 13 11  '$ CREATININE 0.96 1.01   < > 1.30* 1.14 0.89  CALCIUM 9.0 9.1   < > 8.2* 7.9* 8.1*  GFRNONAA 81 77   < > >60 >60 >60  GFRAA 94 89   --   --   --   --    < > = values in this interval not displayed.   CrCl cannot be calculated (Patient's most recent lab result is older than the maximum 21 days allowed.).  CMP Latest Ref Rng & Units 08/22/2020 08/21/2020 08/20/2020  Glucose 70 - 99 mg/dL 109(H) 107(H) 123(H)  BUN 8 - 23 mg/dL '11 13 20  '$ Creatinine 0.61 - 1.24 mg/dL 0.89 1.14 1.30(H)  Sodium 135 - 145 mmol/L 140 139 138  Potassium 3.5 - 5.1 mmol/L 3.6 4.0 4.2  Chloride 98 - 111 mmol/L 107 107 107  CO2 22 - 32 mmol/L '28 28 23  '$ Calcium 8.9 - 10.3 mg/dL 8.1(L) 7.9(L) 8.2(L)  Total Protein 6.5 - 8.1 g/dL 4.7(L) 4.4(L) -  Total Bilirubin 0.3 - 1.2 mg/dL 0.7 0.6 -  Alkaline Phos 38 - 126 U/L 36(L) 33(L) -  AST 15 - 41 U/L 34 34 -  ALT 0 - 44 U/L 23 18 -   CBC Latest Ref Rng & Units 08/22/2020 08/21/2020 08/20/2020  WBC 4.0 - 10.5 K/uL 6.1 7.2 -  Hemoglobin 13.0 - 17.0 g/dL 9.8(L) 9.5(L) 10.3(L)  Hematocrit 39.0 - 52.0 % 30.0(L) 29.7(L) 32.8(L)  Platelets 150 - 400 K/uL 136(L) 138(L) -    Lipid Panel No results for input(s): CHOL, TRIG, LDLCALC, VLDL, HDL, CHOLHDL, LDLDIRECT in the last 8760 hours. Lipid Panel     Component Value Date/Time   CHOL 155 10/08/2019 0817   TRIG 133 10/08/2019 0817   HDL 36 (L) 10/08/2019 0817   CHOLHDL 4.3 10/08/2019 0817   CHOLHDL 4 02/22/2011 0914   VLDL 17.4 02/22/2011 0914   LDLCALC 95 10/08/2019 0817   LABVLDL 24 10/08/2019 0817     HEMOGLOBIN A1C Lab Results  Component Value Date   HGBA1C  03/27/2008    5.5 (NOTE)   The ADA recommends the following therapeutic goal for glycemic   control related to Hgb A1C measurement:   Goal of Therapy:   < 7.0%  Hgb A1C   Reference: American Diabetes Association: Clinical Practice   Recommendations 2008, Diabetes Care,  2008, 31:(Suppl 1).   MPG 111 03/27/2008   TSH No results for input(s): TSH in the last 8760 hours.  External labs:   05/23/2020 Magnesium 2.1  08/19/2020 Magnesium 1.9  08/22/2020 Magnesium 1.8  09/30/2020 Potassium of  4.6  Medications and allergies   Allergies  Allergen Reactions   Losartan Other (See Comments)    Makes BP drop very low   Penicillins Other (See Comments)    Childhood reaction Has patient had a PCN reaction causing immediate rash, facial/tongue/throat swelling, SOB or lightheadedness with hypotension: yes Has patient had a PCN reaction causing severe rash involving mucus membranes or skin necrosis: no Has patient had a PCN reaction that required hospitalization: no Has patient had a PCN reaction occurring within the last 10 years: no If all of the above answers are "NO", then may proceed with Cephalosporin use.      Medication prior to this encounter:   Outpatient Medications Prior to Visit  Medication Sig Dispense Refill   dofetilide (TIKOSYN) 250 MCG capsule Take 1 capsule (250 mcg total) by mouth 2 (two) times daily. 180 capsule 3   fluticasone (FLONASE) 50 MCG/ACT nasal spray Place 2 sprays into both nostrils every evening.      metoprolol succinate (TOPROL-XL) 25 MG 24 hr tablet Take 25 mg by mouth daily.     Probiotic Product (PROBIOTIC DAILY PO) Take 1 capsule by mouth daily.     rosuvastatin (CRESTOR) 5 MG tablet Take 5 mg by mouth every evening.     albuterol (VENTOLIN HFA) 108 (90 Base) MCG/ACT inhaler Inhale 2 puffs into the lungs every 4 (four) hours as needed for wheezing or shortness of breath. 6.7 g 2   oxyCODONE-acetaminophen (PERCOCET) 5-325 MG tablet Take 1 tablet by mouth every 4 (four) hours as needed for severe pain. (Patient not taking: Reported on 09/29/2020) 20 tablet 0   No facility-administered medications prior to visit.     Medication list after today's encounter   Current Outpatient Medications  Medication Instructions   dofetilide (TIKOSYN) 250 mcg, Oral, 2 times daily   fluticasone (FLONASE) 50 MCG/ACT nasal spray 2 sprays, Each Nare, Every evening   magnesium oxide (MAGOX 400) 400 mg, Oral, Daily   metoprolol succinate (TOPROL-XL) 25 mg, Oral,  Daily   Probiotic Product (PROBIOTIC DAILY PO) 1 capsule, Oral, Daily   rosuvastatin (CRESTOR) 5 mg, Oral, Every evening    Radiology:   No results found.  CT Angio Chest/Abd/Pelvis 08/19/2020: 1. Large right-sided perinephric hematoma with evidence of active extravasation from the midpole lower pole junction region laterally. The hematoma measures approximately 11.5 x 10.0 x 9.0 cm with mass effect on the right kidney. No obvious underlying mass is identified. Recommend interventional radiology consultation. 2. No thoracic or abdominal aortic aneurysm or dissection. 3. Irregular and beaded appearance of the right renal arteries could not exclude FMD. There are 2 right and 2 left renal arteries. 4. No acute pulmonary findings or worrisome pulmonary lesions. 5. Aortic atherosclerosis.  Cardiac Studies:   Echocardiogram 10/25/2018:  1. The left ventricle has low normal systolic function, with an ejection fraction of 50-55%. The cavity size was normal. Left ventricular diastolic Doppler parameters are consistent with impaired relaxation.  2. The right ventricle has normal systolic function. The cavity was normal. There is no increase in right ventricular wall thickness.  3. Mild mitral valve prolapse.  4. The mitral  valve is grossly normal. Mild thickening of the mitral valve leaflet.  5. The aortic valve is tricuspid. Aortic valve regurgitation was not assessed by color flow Doppler.  6. The aorta is normal unless otherwise noted.  7. The average left ventricular global longitudinal strain is -16.2 %.  Coronary CTA 11/10/2017: 1. Calcium score 5 isolated area in mid LAD this is 26 th percentile for age and sex.  2.  Normal coronary arteries.  3.  Mild aortic root dilatation 4.0 cm  EKG:   EKG 10/16/2020: Normal sinus rhythm at rate of 63 bpm, left atrial enlargement, otherwise normal EKG.  Assessment     ICD-10-CM   1. Paroxysmal atrial fibrillation (HCC)  I48.0 EKG 12-Lead     magnesium oxide (MAGOX 400) 400 (240 Mg) MG tablet    PCV ECHOCARDIOGRAM COMPLETE    Basic metabolic panel    Magnesium    Magnesium    Basic metabolic panel    2. Aortic root dilatation (HCC)  I77.810 PCV ECHOCARDIOGRAM COMPLETE    3. Therapeutic drug monitoring  Z51.81     4. High risk medication use  Z79.899        Medications Discontinued During This Encounter  Medication Reason   albuterol (VENTOLIN HFA) 108 (90 Base) MCG/ACT inhaler Error   oxyCODONE-acetaminophen (PERCOCET) 5-325 MG tablet Error    Meds ordered this encounter  Medications   magnesium oxide (MAGOX 400) 400 (240 Mg) MG tablet    Sig: Take 1 tablet (400 mg total) by mouth daily.    Dispense:  90 tablet    Refill:  3    Orders Placed This Encounter  Procedures   Basic metabolic panel    Standing Status:   Future    Number of Occurrences:   1    Standing Expiration Date:   10/16/2021   Magnesium    Standing Status:   Future    Number of Occurrences:   1    Standing Expiration Date:   10/16/2021   EKG 12-Lead   PCV ECHOCARDIOGRAM COMPLETE    Standing Status:   Future    Standing Expiration Date:   10/16/2021   CHADS-VASc - score of 1 for male gender Recommendations:   Terry [name] is a 70 y.o. caucasian male with persistent atrial fibrillation on dofetilide and Eliquis, hypertension, chronic systolic and diastolc heart failure with recovered LVEF, aortic dilation at 4.0 cm, recurrent syncope, orthostatic hypotension, and retinal vein occlusion.   The patient presents today for second opinion on Eliquis use due to recent bleeding event that resulted in hospitalization.  Upon evaluation, the patient has not been in atrial fibrillation for the past 4 years and is well controlled on Tikosyn and has a CHADS-VASc score of 1 due to lack of atrial fibrillation and resolution and hypertension secondary to weight loss.  Based on these findings, the patient is at low risk for a stroke.  It was determined that the  patient's bleeding risk on Eliquis is greater then the patient's current risk of stroke. Due to these findings, the recent bleeding event, and the patient's wishes to stop Eliquis, Eliquis may be discontinued.  The patient voiced understanding that the risk of stroke is still present due to the patient's history of atrial fibrillation. The patient desires to get a watch that can monitor his EKG for atrial fibrillation and this was encouraged.  The patient's atrial fibrillation has been well controlled for 4 years on Tikosyn. Tikosyn will be continued with  magnesium and potassium monitoring every 6 months.  The patient does have a history of using OTC magnesium supplements, but these caused significant diarrhea. Prescription for magnesium oxide supplement sent today since review of most recent labs revealed magnesium that is not at goal.  Patient was instructed to stop aspirin since he has low risk factors based on coronary calcium score, significant Weight loss, and prior heart cath.  The patient has significantly reduced his risk factors thanks to weight loss.  The patient could still reach a more optimal BMI and further reduce his risk factors, so the patient was encouraged to lose 5 more pounds.  The patient was instructed to wear compression stockings immediately in the morning to prevent bilateral lower leg edema secondary to likely venous insufficiency.  The patient reports a dry cough related to acid reflux. Omeprazole as needed and encouraged.  May follow up in 3 months for a-fib and Tikosyn monitoring or sooner as needed.    Terry Cuttino,PA-S 10/16/2020, 1:31 PM Office: [phone]

## 2020-10-20 ENCOUNTER — Ambulatory Visit: Payer: 59

## 2020-10-20 ENCOUNTER — Other Ambulatory Visit: Payer: Self-pay

## 2020-10-20 DIAGNOSIS — I480 Paroxysmal atrial fibrillation: Secondary | ICD-10-CM

## 2020-10-20 DIAGNOSIS — I77810 Thoracic aortic ectasia: Secondary | ICD-10-CM

## 2020-10-28 ENCOUNTER — Other Ambulatory Visit: Payer: 59

## 2020-11-10 ENCOUNTER — Other Ambulatory Visit (HOSPITAL_COMMUNITY): Payer: Self-pay | Admitting: Cardiovascular Disease

## 2020-12-24 ENCOUNTER — Telehealth: Payer: Self-pay | Admitting: Cardiology

## 2020-12-24 DIAGNOSIS — I480 Paroxysmal atrial fibrillation: Secondary | ICD-10-CM

## 2020-12-24 NOTE — Telephone Encounter (Signed)
Called pt to see how he is feeling, due that he called earlier today stating that his apple watch mention he was in A-Fib. Pt mention he is not feeling "that bad", pt mention he does feel a little of the flutter. Pt bp is 132/89 pulse is 95. Pt denies headaches, dizziness, SOB. Pt has an appt on 11/09

## 2020-12-24 NOTE — Telephone Encounter (Signed)
I believe this is the patient you and MP spoke about

## 2020-12-24 NOTE — Telephone Encounter (Signed)
Pt called because his apple watch was saying that he is in AFIB so he was concerned requesting a call back from nurse.

## 2020-12-24 NOTE — Telephone Encounter (Signed)
Pre Dr. Virgina Jock pt should monitor his heart rate and to continue with flecainide, pt was informed that if he has an symptoms to give [place] a call or if his heart rate is higher then 110. To give [place] a call in the next few days or to call the ER. Pt understood

## 2020-12-25 NOTE — Telephone Encounter (Signed)
ON-CALL CARDIOLOGY 12/24/2020  Patient's name: Terry [name].   MRN: [id number].    DOB: Oct 24, 1950 Primary care provider: Lawerance Cruel, MD. Primary cardiologist: Dr. Einar Gip  Interaction regarding this patient's care today: Patient called on-call service with concerns that he may have had recurrence of atrial fibrillation.  He is presently off Eliquis due to spontaneous retroperitoneal bleed and 08/2020.  Patient reports over the last few days he has noticed intermittent episodes of heart rate as high as 98 bpm.  He has also noticed his blood pressure is elevated 150/90s mmHg.  Patient is otherwise asymptomatic.  Denies chest pain, palpitations syncope, near syncope, dizziness, lightheadedness.  Patient notes that he tracks his heart rate on his smart watch.  Impression:   ICD-10-CM   1. Paroxysmal atrial fibrillation (HCC)  I48.0       No orders of the defined types were placed in this encounter.   No orders of the defined types were placed in this encounter.   Recommendations: Advised patient to increase metoprolol from 25mg  to 50 mg. Will schedule office visit  Telephone encounter total time: 12 minutes     Alethia Berthold, PA-C 12/25/2020, 11:57 AM Office: [phone]

## 2020-12-29 ENCOUNTER — Telehealth: Payer: Self-pay | Admitting: Cardiology

## 2020-12-30 ENCOUNTER — Ambulatory Visit (HOSPITAL_COMMUNITY): Payer: 59 | Admitting: Physician Assistant

## 2021-01-08 ENCOUNTER — Other Ambulatory Visit: Payer: Self-pay | Admitting: Cardiovascular Disease

## 2021-01-08 NOTE — Telephone Encounter (Addendum)
Per Dr Einar Gip OV on 10/19/20 for second opinion on Eliquis, Eliquis was discontinued secondary to risk vs benefit discussion with pt.  Eliquis is not on pt's medication list. Called pt to verify he is not currently taking Eliquis and that he has now established cardiac care at [facility] cardiology. Will await call back.

## 2021-01-13 NOTE — Telephone Encounter (Signed)
Pt has not returned call to clinic, based on last note from Dr Einar Gip Eliquis d/c, no longer on med list and he is now following with him.  Will refuse Eliquis rx.

## 2021-01-16 LAB — BASIC METABOLIC PANEL
BUN/Creatinine Ratio: 12 (ref 10–24)
BUN: 14 mg/dL (ref 8–27)
CO2: 26 mmol/L (ref 20–29)
Calcium: 9.4 mg/dL (ref 8.6–10.2)
Chloride: 102 mmol/L (ref 96–106)
Creatinine, Ser: 1.14 mg/dL (ref 0.76–1.27)
Glucose: 97 mg/dL (ref 70–99)
Potassium: 4.2 mmol/L (ref 3.5–5.2)
Sodium: 140 mmol/L (ref 134–144)
eGFR: 70 mL/min/{1.73_m2} (ref >59)

## 2021-01-16 LAB — MAGNESIUM: Magnesium: 2.2 mg/dL (ref 1.6–2.3)

## 2021-01-21 ENCOUNTER — Encounter: Payer: Self-pay | Admitting: Cardiology

## 2021-01-21 ENCOUNTER — Other Ambulatory Visit: Payer: Self-pay

## 2021-01-21 ENCOUNTER — Ambulatory Visit: Payer: 59 | Admitting: Cardiology

## 2021-01-21 VITALS — BP 115/75 | HR 59 | Temp 98.3°F | Resp 16 | Ht 73.0 in | Wt 189.4 lb

## 2021-01-21 DIAGNOSIS — I480 Paroxysmal atrial fibrillation: Secondary | ICD-10-CM

## 2021-01-21 DIAGNOSIS — I10 Essential (primary) hypertension: Secondary | ICD-10-CM

## 2021-01-21 DIAGNOSIS — R600 Localized edema: Secondary | ICD-10-CM

## 2021-01-21 DIAGNOSIS — I77810 Thoracic aortic ectasia: Secondary | ICD-10-CM

## 2021-01-21 DIAGNOSIS — Z5181 Encounter for therapeutic drug level monitoring: Secondary | ICD-10-CM

## 2021-01-21 MED ORDER — METOPROLOL SUCCINATE ER 50 MG PO TB24: 50.0000 mg | ORAL_TABLET | Freq: Every day | ORAL | 3 refills | Status: DC

## 2021-01-21 NOTE — Progress Notes (Signed)
Primary Physician/Referring:  Lawerance Cruel, MD  Patient ID: Terry [name], male    DOB: [birth year], 70 [name].o.   MRN: [id number]  Chief Complaint  Patient presents with   Atrial Fibrillation   Terry [name]    HPI:    Terry [name]  is a 70 [name].o. Caucasian male with family history of atrial fibrillation Terry also paroxysmal atrial fibrillation on dofetilide Terry [name], Terry [name], Terry [name], Terry [name], Terry [name], Terry right retinal vein occlusion at age 70 [name].  He also has multiple members in the family who had coronary artery disease.  He is on low-dose statin.  Patient was admitted to the hospital on 08/19/2020 with retroperitoneal bleed due to patient being on [name] with mass-effect on the right kidney.  He was seen by me on 10/16/2020 for a second opinion, is presently not on any anticoagulants as his CHA2DS2-VASc risk score is very low.  He underwent echocardiogram Terry presents for follow-up, he remains asymptomatic.  He has had 1 recurrence of atrial fibrillation, metoprolol succinate was increased from 25 mg to 50 mg daily also for elevated blood pressure.  He is tolerating this, he remains asymptomatic presently except for Terry leg edema.  [name].  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 mild by CT 10/2017.   Basal cell carcinoma 10/02/2019   left hand bcc nod.   Cardiomyopathy [facility])    Central retinal vein occlusion    Terry systolic CHF (congestive heart failure) (HCC)    ED (erectile dysfunction)    H/O cardiac catheterization 2002 - normal cors   HTN (Terry [name])    Hyperlipidemia    Hypokalemia    Leg edema    LV dysfunction    a. EF appeared mildly depressed by TEE 08/2015, no % given.   Mini stroke    a. ? Age 70 at time of central retinal vein occlusion - lost vision in eye   PAF (paroxysmal atrial  fibrillation) ([place])    a. Dx 06/2015, s/p TEE/DCCV 08/2015.   Pericarditis 2010   [name]    Hx of thought to be due to orthostatic hypotension   Visit for monitoring Tikosyn therapy 01/17/2018   Past Surgical History:  Procedure Laterality Date   CARDIAC CATHETERIZATION  2002   CARDIOVERSION N/A 09/08/2015   Procedure: CARDIOVERSION;  Surgeon: Fay Records, MD;  Location: [facility];  Service: Cardiovascular;  Laterality: N/A;   CARDIOVERSION N/A 06/28/2017   Procedure: CARDIOVERSION;  Surgeon: Jerline Pain, MD;  Location: [facility] ENDOSCOPY;  Service: Cardiovascular;  Laterality: N/A;   HAND SURGERY  2003   KNEE SURGERY     NASAL SINUS SURGERY  2003   x2. Scar tissue build up Terry had to recreate his tear duct   PERICARDIECTOMY     TEE WITHOUT CARDIOVERSION N/A 09/08/2015   Procedure: TRANSESOPHAGEAL ECHOCARDIOGRAM (TEE);  Surgeon: Fay Records, MD;  Location: [facility] ENDOSCOPY;  Service: Cardiovascular;  Laterality: N/A;   TEE WITHOUT CARDIOVERSION N/A 06/28/2017   Procedure: TRANSESOPHAGEAL ECHOCARDIOGRAM (TEE);  Surgeon: Jerline Pain, MD;  Location: [facility] ENDOSCOPY;  Service: Cardiovascular;  Laterality: N/A;   Family History  Problem Relation Age of Onset   Terry [name] Mother    Diabetes Mother    Cancer Mother    Atrial fibrillation Father    Heart disease Father  had pacemaker   Terry [name] Father    Diabetes Father    Atrial fibrillation Brother    Heart attack Brother 70    Social History   Tobacco Use   Smoking status: Never   Smokeless tobacco: Never  Substance Use Topics   Alcohol use: Not Currently    Comment: 2-3 Times a Week Wine with Meals   Marital Status: Married  ROS  Review of Systems  Cardiovascular:  Positive for leg swelling (Terry). Negative for chest pain Terry dyspnea on exertion.  Respiratory:  Positive for snoring (negative sleep study per patient).   Gastrointestinal:  Negative for melena.  Objective  Blood pressure 115/75, pulse (!) 59,  temperature 98.3 F (36.8 C), temperature source Temporal, resp. rate 16, height 6\' 1"  (1.854 m), weight 189 lb 6.4 oz (85.9 kg), SpO2 97 %. Body mass index is 24.99 kg/m.  Vitals with BMI 01/21/2021 10/16/2020 09/29/2020  Height 6\' 1"  6\' 1"  6\' 1"   Weight 189 lbs 6 oz 185 lbs 13 oz 185 lbs 10 oz  BMI 24.99 37.85 88.50  Systolic 277 412 878  Diastolic 75 83 78  Pulse 59 63 72     Physical Exam Neck:     Vascular: No carotid bruit or JVD.  Cardiovascular:     Rate Terry Rhythm: Normal rate Terry regular rhythm.     Pulses: Intact distal pulses.     Heart sounds: Normal heart sounds. No murmur heard.   No gallop.  Pulmonary:     Effort: Pulmonary effort is normal.     Breath sounds: Normal breath sounds.  Abdominal:     General: Bowel sounds are normal.     Palpations: Abdomen is soft.  Musculoskeletal:     Right lower leg: Edema (2+ bilateral leg edema below knee) present.     Left lower leg: Edema (2+ bilateral leg edema below knee) present.     Laboratory examination:   Recent Labs    04/16/20 0735 05/02/20 0726 08/19/20 1020 08/20/20 0326 08/21/20 0039 08/22/20 0035 01/15/21 0720  NA 140 140   < > 138 139 140 140  K 4.1 4.2   < > 4.2 4.0 3.6 4.2  CL 102 103   < > 107 107 107 102  CO2 27 24   < > 23 28 28 26   GLUCOSE 94 95   < > 123* 107* 109* 97  BUN 17 16   < > 20 13 11 14   CREATININE 0.96 1.01   < > 1.30* 1.14 0.89 1.14  CALCIUM 9.0 9.1   < > 8.2* 7.9* 8.1* 9.4  GFRNONAA 81 77   < > >60 >60 >60  --   GFRAA 94 89  --   --   --   --   --    < > = values in this interval not displayed.   estimated creatinine clearance is 70.1 mL/min (by C-G formula based on SCr of 1.14 mg/dL).  CMP Latest Ref Rng & Units 01/15/2021 08/22/2020 08/21/2020  Glucose 70 - 99 mg/dL 97 109(H) 107(H)  BUN 8 - 27 mg/dL 14 11 13   Creatinine 0.76 - 1.27 mg/dL 1.14 0.89 1.14  Sodium 134 - 144 mmol/L 140 140 139  Potassium 3.5 - 5.2 mmol/L 4.2 3.6 4.0  Chloride 96 - 106 mmol/L 102 107 107  CO2 20  - 29 mmol/L 26 28 28   Calcium 8.6 - 10.2 mg/dL 9.4 8.1(L) 7.9(L)  Total Protein 6.5 - 8.1 g/dL -  4.7(L) 4.4(L)  Total Bilirubin 0.3 - 1.2 mg/dL - 0.7 0.6  Alkaline Phos 38 - 126 U/L - 36(L) 33(L)  AST 15 - 41 U/L - 34 34  ALT 0 - 44 U/L - 23 18   CBC Latest Ref Rng & Units 08/22/2020 08/21/2020 08/20/2020  WBC 4.0 - 10.5 K/uL 6.1 7.2 -  Hemoglobin 13.0 - 17.0 g/dL 9.8(L) 9.5(L) 10.3(L)  Hematocrit 39.0 - 52.0 % 30.0(L) 29.7(L) 32.8(L)  Platelets 150 - 400 K/uL 136(L) 138(L) -    Lipid Panel No results for input(s): CHOL, TRIG, LDLCALC, VLDL, HDL, CHOLHDL, LDLDIRECT in the last 8760 hours. Lipid Panel     Component Value Date/Time   CHOL 155 10/08/2019 0817   TRIG 133 10/08/2019 0817   HDL 36 (L) 10/08/2019 0817   CHOLHDL 4.3 10/08/2019 0817   CHOLHDL 4 02/22/2011 0914   VLDL 17.4 02/22/2011 0914   LDLCALC 95 10/08/2019 0817   LABVLDL 24 10/08/2019 0817     HEMOGLOBIN A1C Lab Results  Component Value Date   HGBA1C  03/27/2008    5.5 (NOTE)   The ADA recommends the following therapeutic goal for glycemic   control related to Hgb A1C measurement:   Goal of Therapy:   < 7.0% Hgb A1C   Reference: American Diabetes Association: Clinical Practice   Recommendations 2008, Diabetes Care,  2008, 31:(Suppl 1).   MPG 111 03/27/2008   Magnesium level 01/15/2021: 2.2.  TSH No results for input(s): TSH in the last 8760 hours.  External labs:   05/23/2020 Magnesium 2.1  08/19/2020 Magnesium 1.9  08/22/2020 Magnesium 1.8  09/30/2020 Potassium of 4.6  Medications Terry allergies   Allergies  Allergen Reactions   Losartan Other (See Comments)    Makes BP drop very low   Penicillins Other (See Comments)    Childhood reaction Has patient had a PCN reaction causing immediate rash, facial/tongue/throat swelling, SOB or lightheadedness with hypotension: yes Has patient had a PCN reaction causing severe rash involving mucus membranes or skin necrosis: no Has patient had a PCN reaction  that required hospitalization: no Has patient had a PCN reaction occurring within the last 10 years: no If all of the above answers are "NO", then may proceed with Cephalosporin use.      Medication prior to this encounter:   Outpatient Medications Prior to Visit  Medication Sig Dispense Refill   dofetilide (TIKOSYN) 250 MCG capsule Take 1 capsule (250 mcg total) by mouth 2 (two) times daily. 180 capsule 3   fluticasone (FLONASE) 50 MCG/ACT nasal spray Place 2 sprays into both nostrils every evening.      magnesium oxide (MAGOX 400) 400 (240 Mg) MG tablet Take 1 tablet (400 mg total) by mouth daily. 90 tablet 3   Probiotic Product (PROBIOTIC DAILY PO) Take 1 capsule by mouth daily.     rosuvastatin (CRESTOR) 5 MG tablet Take 5 mg by mouth every evening.     metoprolol succinate (TOPROL-XL) 50 MG 24 hr tablet Take 50 mg by mouth daily.     No facility-administered medications prior to visit.     Medication list after today's encounter   Current Outpatient Medications  Medication Instructions   dofetilide (TIKOSYN) 250 mcg, Oral, 2 times daily   fluticasone (FLONASE) 50 MCG/ACT nasal spray 2 sprays, Each Nare, Every evening   magnesium oxide (MAGOX 400) 400 mg, Oral, Daily   metoprolol succinate (TOPROL-XL) 50 mg, Oral, Daily   Probiotic Product (PROBIOTIC DAILY PO) 1 capsule, Oral, Daily  rosuvastatin (CRESTOR) 5 mg, Oral, Every evening    Radiology:   No results found.  CT Angio Chest/Abd/Pelvis 08/19/2020: 1. Large right-sided perinephric hematoma with evidence of active extravasation from the midpole lower pole junction region laterally. The hematoma measures approximately 11.5 x 10.0 x 9.0 [name] with mass effect on the right kidney. No obvious underlying mass is identified. Recommend interventional radiology consultation. 2. No thoracic or abdominal Terry aneurysm or dissection. 3. Irregular Terry beaded appearance of the right renal arteries could not exclude FMD. There  are 2 right Terry 2 left renal arteries. 4. No acute pulmonary findings or worrisome pulmonary lesions. 5. Terry atherosclerosis.  ADDENDUM REPORT: 10/17/2020 15:15   ADDENDUM: I reviewed this study for Dr. Adrian Prows. Concern for Terry root dilatation. There is moderate artifact through the region of the Terry root. The maximum diameter at the sinuses of Valsalva is 41.5 mm Terry the diameter at the sinotubular junction is 2.9 [name].   Electronically Signed   By: Marijo Sanes M.D.   On: 10/17/2020 15:15  Cardiac Studies:   Coronary CTA 11/10/2017: 1. Calcium score 5 isolated area in mid LAD this is 26 th percentile for age Terry sex.  2.  Normal coronary arteries.  3.  Mild Terry root dilatation 4.0 [name].  PCV ECHOCARDIOGRAM COMPLETE 10/20/2020  Narrative Echocardiogram 10/20/2020: Normal LV systolic function with visual EF 55-60%. Left ventricle cavity is normal in size. Normal left ventricular wall thickness. Normal global wall motion. Normal diastolic filling pattern, normal LAP. Left atrial cavity is normal in size. A lipomatous septum is present. Mild (Grade I) mitral regurgitation. Mild tricuspid regurgitation. Mild pulmonic regurgitation. The Terry root is mildly dilated (3.8cm). Proximal ascending aorta not well visualized. Compared to study 10/25/2018: no significant change.    EKG:   EKG 01/21/2021: Normal sinus rhythm at rate of 57 bpm, left atrial enlargement, normal axis, no evidence of ischemia, normal QT interval.  No significant change from 10/16/2020.   Assessment     ICD-10-[name]   1. Paroxysmal atrial fibrillation (HCC)  I48.0 EKG 12-Lead    PCV ECHOCARDIOGRAM COMPLETE    2. Therapeutic drug monitoring  Z51.81     3. Terry root dilatation (HCC)  I77.810 PCV ECHOCARDIOGRAM COMPLETE    4. Essential Terry [name]  I10 metoprolol succinate (TOPROL-XL) 50 MG 24 hr tablet    5. Bilateral leg edema  R60.0       Medications Discontinued During This Encounter   Medication Reason   metoprolol succinate (TOPROL-XL) 50 MG 24 hr tablet Reorder     Meds ordered this encounter  Medications   metoprolol succinate (TOPROL-XL) 50 MG 24 hr tablet    Sig: Take 1 tablet (50 mg total) by mouth daily.    Dispense:  90 tablet    Refill:  3     Orders Placed This Encounter  Procedures   EKG 12-Lead   PCV ECHOCARDIOGRAM COMPLETE    Standing Status:   Future    Standing Expiration Date:   01/21/2022    Order Specific Question:   Comment    Answer:   Terry root dilatation   CHADS-VASc - score of 1 for male gender Recommendations:   Leno Mathes is a 5 [name].o. Caucasian male with family history of atrial fibrillation Terry also paroxysmal atrial fibrillation on dofetilide Terry [name], Terry [name], Terry [name], Terry [name], Terry [name], Terry right retinal vein occlusion at age 28 [name].  He also has multiple members in the family who had coronary artery disease.  He is on low-dose statin.  Patient was admitted to the hospital on 08/19/2020 with retroperitoneal bleed due to patient being on [name] with mass-effect on the right kidney.  He was seen by me on 10/16/2020 for a second opinion, is presently not on any anticoagulants as his CHA2DS2-VASc risk score is very low.  Physical examination is unremarkable.  Patient states that he has had recurrence of atrial fibrillation by apple watch that he is wearing, advised him that he may have recurrence of A. 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  Blood pressures well controlled, I reviewed the results of the echocardiogram which reveals very mild Terry root dilatation.  I also reviewed with radiology Terry addendum report enclosed with regard to CT scan of the chest that was performed recently.  Stable from  cardiac standpoint, I will see him back in 1 year for follow-up. Will check Echo in 1 year for Terry root dilatation.  I suspect he has Terry venous insufficiency of the bilateral lower extremity right worse than the left, he does have mild claudication involving the lower extremity Terry he does wear support stockings regularly.  If this becomes a nuisance, we could consider venous insufficiency study at a later date.   Adrian Prows, MD, [facility] 01/21/2021, 10:38 AM Office: [phone] Fax: [phone] Pager: [phone]

## 2021-02-23 ENCOUNTER — Telehealth: Payer: Self-pay

## 2021-02-23 NOTE — Telephone Encounter (Signed)
-----   Message from Thayer Headings, MD sent at 02/23/2021  1:33 PM EST ----- Terry [name] has an appt with me in late January He has been seeing Dr. Einar Gip - I doubt he will be coming to this appt.  PN

## 2021-02-23 NOTE — Telephone Encounter (Signed)
Left message for patient to call back to see if we can cancel his appointment in January with Dr. Acie Fredrickson as he is now following with Dr. Einar Gip.

## 2021-03-19 DIAGNOSIS — Z0000 Encounter for general adult medical examination without abnormal findings: Secondary | ICD-10-CM | POA: Diagnosis not present

## 2021-03-19 DIAGNOSIS — J309 Allergic rhinitis, unspecified: Secondary | ICD-10-CM | POA: Diagnosis not present

## 2021-03-19 DIAGNOSIS — Z1159 Encounter for screening for other viral diseases: Secondary | ICD-10-CM | POA: Diagnosis not present

## 2021-03-19 DIAGNOSIS — I77810 Thoracic aortic ectasia: Secondary | ICD-10-CM | POA: Diagnosis not present

## 2021-03-19 DIAGNOSIS — E7800 Pure hypercholesterolemia, unspecified: Secondary | ICD-10-CM | POA: Diagnosis not present

## 2021-03-19 DIAGNOSIS — I509 Heart failure, unspecified: Secondary | ICD-10-CM | POA: Diagnosis not present

## 2021-03-19 DIAGNOSIS — I10 Essential (primary) hypertension: Secondary | ICD-10-CM | POA: Diagnosis not present

## 2021-04-06 ENCOUNTER — Ambulatory Visit: Payer: 59 | Admitting: Cardiovascular Disease

## 2021-04-15 ENCOUNTER — Encounter: Payer: Self-pay | Admitting: Cardiology

## 2021-04-15 ENCOUNTER — Telehealth: Payer: Self-pay

## 2021-04-15 NOTE — Telephone Encounter (Signed)
Patients insurance prefers amiodarone instead of tikosyn, patient cannot afford the new cost pf the dofetalide. Please advise.

## 2021-04-15 NOTE — Telephone Encounter (Signed)
He is relatively young, amiodarone has toxicity on the long-term use and we prefer to avoid it.  However his options include continuing dofetilide or discontinuing dofetilide and see whether he has recurrence of atrial fibrillation however if he does indeed have A. fib recurrence, we could try Multaq or he needs to be admitted back to the hospital for 2 days to reinitiate dofetilide.  If he prefers to go on amiodarone, I could certainly start this as long as he understands that there is long-term potential side effects including effects on lungs, thyroid, sensitivity to sun, other things.  I will also send a MyChart messaging with the same message.

## 2021-04-16 NOTE — Telephone Encounter (Signed)
From patient.

## 2021-04-17 ENCOUNTER — Other Ambulatory Visit: Payer: Self-pay

## 2021-04-27 ENCOUNTER — Telehealth: Payer: Self-pay | Admitting: Cardiology

## 2021-04-27 NOTE — Telephone Encounter (Signed)
Patient says he's having an issue with the pharmacy in his insurance. He has been taking dofetilide 250 MCG which has helped prevent patient from developing a-fib. However, patient switched insurances and the med costs a significant amount more than it used to. He says they offered him to take a different medication instead of dofetilide, but JG advised patient not to do so as it would not be beneficial for him from a health stand-point. He says he was informed of a tier reduction to assist with cost of medication. Pharmacy supposed to send a request to our office on February 4th. Patient wanting to know the status of this. Please call him at [phone].

## 2021-04-28 NOTE — Telephone Encounter (Signed)
Called and reviewed with pt. Pt would like to continue with Tikosyn since he has been tolerating it well and has been responding well without any recent recurrence of Afib episodes. Started PA and tier exemption on pt's behalf. Reviewed possibly getting the Tikosyn through either [facility] or [facility] mail order pharmacy for lower cost alternative if not covered through insurance.

## 2021-05-13 ENCOUNTER — Telehealth: Payer: Self-pay

## 2021-05-13 DIAGNOSIS — I480 Paroxysmal atrial fibrillation: Secondary | ICD-10-CM

## 2021-05-13 MED ORDER — DOFETILIDE 250 MCG PO CAPS: 250.0000 ug | ORAL_CAPSULE | Freq: Two times a day (BID) | ORAL | 3 refills | Status: DC

## 2021-05-13 NOTE — Telephone Encounter (Signed)
ICD-10-CM   ?1. Paroxysmal atrial fibrillation (HCC)  I48.0 dofetilide (TIKOSYN) 250 MCG capsule  ?  ? ?Meds ordered this encounter  ?Medications  ? dofetilide (TIKOSYN) 250 MCG capsule  ?  Sig: Take 1 capsule (250 mcg total) by mouth 2 (two) times daily.  ?  Dispense:  200 capsule  ?  Refill:  3  ?  Just sending the Rx so the Rx goes in my name for future  ?  ?

## 2021-05-13 NOTE — Telephone Encounter (Signed)
Dofetilide 250 mcg approved 04/30/2021 through 03/14/2022. Can you send a refill? ?

## 2021-05-19 DIAGNOSIS — I110 Hypertensive heart disease with heart failure: Secondary | ICD-10-CM | POA: Diagnosis not present

## 2021-05-19 DIAGNOSIS — I509 Heart failure, unspecified: Secondary | ICD-10-CM | POA: Diagnosis not present

## 2021-05-19 DIAGNOSIS — E7800 Pure hypercholesterolemia, unspecified: Secondary | ICD-10-CM | POA: Diagnosis not present

## 2021-05-19 DIAGNOSIS — I10 Essential (primary) hypertension: Secondary | ICD-10-CM | POA: Diagnosis not present

## 2021-05-19 DIAGNOSIS — E785 Hyperlipidemia, unspecified: Secondary | ICD-10-CM | POA: Diagnosis not present

## 2021-05-19 DIAGNOSIS — I4891 Unspecified atrial fibrillation: Secondary | ICD-10-CM | POA: Diagnosis not present

## 2021-05-19 DIAGNOSIS — J45909 Unspecified asthma, uncomplicated: Secondary | ICD-10-CM | POA: Diagnosis not present

## 2021-05-19 DIAGNOSIS — D6869 Other thrombophilia: Secondary | ICD-10-CM | POA: Diagnosis not present

## 2021-06-02 ENCOUNTER — Telehealth: Payer: Self-pay

## 2021-06-02 ENCOUNTER — Other Ambulatory Visit: Payer: Self-pay | Admitting: Cardiology

## 2021-06-02 DIAGNOSIS — I480 Paroxysmal atrial fibrillation: Secondary | ICD-10-CM

## 2021-06-02 MED ORDER — DOFETILIDE 250 MCG PO CAPS: 250.0000 ug | ORAL_CAPSULE | Freq: Two times a day (BID) | ORAL | 3 refills | Status: DC

## 2021-06-02 NOTE — Telephone Encounter (Signed)
Prior Terry [name] has been approved for dofetilide 04/30/2021 through 02/24/2022. Okay to refill? ?

## 2021-06-02 NOTE — Telephone Encounter (Signed)
I sent it to mail order

## 2021-07-11 ENCOUNTER — Other Ambulatory Visit: Payer: Self-pay | Admitting: Cardiology

## 2021-07-11 DIAGNOSIS — I480 Paroxysmal atrial fibrillation: Secondary | ICD-10-CM

## 2021-07-30 ENCOUNTER — Other Ambulatory Visit: Payer: Self-pay | Admitting: Cardiology

## 2021-07-30 DIAGNOSIS — I480 Paroxysmal atrial fibrillation: Secondary | ICD-10-CM

## 2021-07-30 NOTE — Telephone Encounter (Signed)
Okay to refill? 

## 2021-08-07 ENCOUNTER — Emergency Department (HOSPITAL_COMMUNITY): Payer: PPO

## 2021-08-07 ENCOUNTER — Other Ambulatory Visit: Payer: Self-pay

## 2021-08-07 ENCOUNTER — Inpatient Hospital Stay (HOSPITAL_COMMUNITY)
Admission: EM | Admit: 2021-08-07 | Discharge: 2021-08-09 | DRG: 065 | Disposition: A | Discharge status: Home Health | Payer: PPO | Attending: Internal Medicine | Admitting: Internal Medicine

## 2021-08-07 DIAGNOSIS — R29703 NIHSS score 3: Secondary | ICD-10-CM | POA: Diagnosis present

## 2021-08-07 DIAGNOSIS — E875 Hyperkalemia: Secondary | ICD-10-CM | POA: Diagnosis present

## 2021-08-07 DIAGNOSIS — I4891 Unspecified atrial fibrillation: Secondary | ICD-10-CM | POA: Diagnosis not present

## 2021-08-07 DIAGNOSIS — I639 Cerebral infarction, unspecified: Secondary | ICD-10-CM | POA: Diagnosis present

## 2021-08-07 DIAGNOSIS — I110 Hypertensive heart disease with heart failure: Secondary | ICD-10-CM | POA: Diagnosis present

## 2021-08-07 DIAGNOSIS — I63411 Cerebral infarction due to embolism of right middle cerebral artery: Secondary | ICD-10-CM | POA: Diagnosis not present

## 2021-08-07 DIAGNOSIS — I4901 Ventricular fibrillation: Secondary | ICD-10-CM | POA: Diagnosis not present

## 2021-08-07 DIAGNOSIS — R4781 Slurred speech: Secondary | ICD-10-CM | POA: Diagnosis present

## 2021-08-07 DIAGNOSIS — I5022 Chronic systolic (congestive) heart failure: Secondary | ICD-10-CM | POA: Diagnosis not present

## 2021-08-07 DIAGNOSIS — I340 Nonrheumatic mitral (valve) insufficiency: Secondary | ICD-10-CM | POA: Diagnosis not present

## 2021-08-07 DIAGNOSIS — E785 Hyperlipidemia, unspecified: Secondary | ICD-10-CM | POA: Diagnosis not present

## 2021-08-07 DIAGNOSIS — Z79899 Other long term (current) drug therapy: Secondary | ICD-10-CM | POA: Diagnosis not present

## 2021-08-07 DIAGNOSIS — I2510 Atherosclerotic heart disease of native coronary artery without angina pectoris: Secondary | ICD-10-CM | POA: Diagnosis not present

## 2021-08-07 DIAGNOSIS — I700 Atherosclerosis of aorta: Secondary | ICD-10-CM | POA: Diagnosis not present

## 2021-08-07 DIAGNOSIS — I480 Paroxysmal atrial fibrillation: Secondary | ICD-10-CM | POA: Diagnosis present

## 2021-08-07 DIAGNOSIS — I10 Essential (primary) hypertension: Secondary | ICD-10-CM | POA: Diagnosis not present

## 2021-08-07 DIAGNOSIS — R471 Dysarthria and anarthria: Secondary | ICD-10-CM | POA: Diagnosis not present

## 2021-08-07 DIAGNOSIS — I6340 Cerebral infarction due to embolism of unspecified cerebral artery: Secondary | ICD-10-CM | POA: Diagnosis not present

## 2021-08-07 DIAGNOSIS — E7800 Pure hypercholesterolemia, unspecified: Secondary | ICD-10-CM | POA: Diagnosis not present

## 2021-08-07 DIAGNOSIS — Z7901 Long term (current) use of anticoagulants: Secondary | ICD-10-CM | POA: Diagnosis not present

## 2021-08-07 DIAGNOSIS — U071 COVID-19: Secondary | ICD-10-CM | POA: Diagnosis not present

## 2021-08-07 DIAGNOSIS — Z880 Allergy status to penicillin: Secondary | ICD-10-CM

## 2021-08-07 DIAGNOSIS — Z8249 Family history of ischemic heart disease and other diseases of the circulatory system: Secondary | ICD-10-CM

## 2021-08-07 DIAGNOSIS — R531 Weakness: Secondary | ICD-10-CM | POA: Diagnosis not present

## 2021-08-07 DIAGNOSIS — G9389 Other specified disorders of brain: Secondary | ICD-10-CM | POA: Diagnosis not present

## 2021-08-07 DIAGNOSIS — I6381 Other cerebral infarction due to occlusion or stenosis of small artery: Secondary | ICD-10-CM | POA: Diagnosis not present

## 2021-08-07 DIAGNOSIS — Z85828 Personal history of other malignant neoplasm of skin: Secondary | ICD-10-CM | POA: Diagnosis not present

## 2021-08-07 DIAGNOSIS — Z8616 Personal history of COVID-19: Secondary | ICD-10-CM

## 2021-08-07 LAB — APTT: aPTT: 30 seconds (ref 24–36)

## 2021-08-07 LAB — DIFFERENTIAL
Abs Immature Granulocytes: 0.01 10*3/uL (ref 0.00–0.07)
Basophils Absolute: 0.1 10*3/uL (ref 0.0–0.1)
Basophils Relative: 1 %
Eosinophils Absolute: 0.1 10*3/uL (ref 0.0–0.5)
Eosinophils Relative: 2 %
Immature Granulocytes: 0 %
Lymphocytes Relative: 34 %
Lymphs Abs: 1.6 10*3/uL (ref 0.7–4.0)
Monocytes Absolute: 0.4 10*3/uL (ref 0.1–1.0)
Monocytes Relative: 9 %
Neutro Abs: 2.6 10*3/uL (ref 1.7–7.7)
Neutrophils Relative %: 54 %

## 2021-08-07 LAB — COMPREHENSIVE METABOLIC PANEL
ALT: 12 U/L (ref 0–44)
AST: 39 U/L (ref 15–41)
Albumin: 3.4 g/dL — ABNORMAL LOW (ref 3.5–5.0)
Alkaline Phosphatase: 43 U/L (ref 38–126)
Anion gap: 9 (ref 5–15)
BUN: 20 mg/dL (ref 8–23)
CO2: 20 mmol/L — ABNORMAL LOW (ref 22–32)
Calcium: 8.8 mg/dL — ABNORMAL LOW (ref 8.9–10.3)
Chloride: 108 mmol/L (ref 98–111)
Creatinine, Ser: 1.17 mg/dL (ref 0.61–1.24)
GFR, Estimated: >60 mL/min (ref >60)
Glucose, Bld: 80 mg/dL (ref 70–99)
Potassium: 5.8 mmol/L — ABNORMAL HIGH (ref 3.5–5.1)
Sodium: 137 mmol/L (ref 135–145)
Total Bilirubin: 1.9 mg/dL — ABNORMAL HIGH (ref 0.3–1.2)
Total Protein: 5.8 g/dL — ABNORMAL LOW (ref 6.5–8.1)

## 2021-08-07 LAB — RAPID URINE DRUG SCREEN, HOSP PERFORMED
Amphetamines: NOT DETECTED
Barbiturates: NOT DETECTED
Benzodiazepines: NOT DETECTED
Cocaine: NOT DETECTED
Opiates: NOT DETECTED
Tetrahydrocannabinol: NOT DETECTED

## 2021-08-07 LAB — CBC
HCT: 48.2 % (ref 39.0–52.0)
Hemoglobin: 15.3 g/dL (ref 13.0–17.0)
MCH: 30.7 pg (ref 26.0–34.0)
MCHC: 31.7 g/dL (ref 30.0–36.0)
MCV: 96.8 fL (ref 80.0–100.0)
Platelets: 182 10*3/uL (ref 150–400)
RBC: 4.98 MIL/uL (ref 4.22–5.81)
RDW: 13.2 % (ref 11.5–15.5)
WBC: 4.9 10*3/uL (ref 4.0–10.5)
nRBC: 0 % (ref 0.0–0.2)

## 2021-08-07 LAB — URINALYSIS, ROUTINE W REFLEX MICROSCOPIC
Bilirubin Urine: NEGATIVE
Glucose, UA: NEGATIVE mg/dL
Hgb urine dipstick: NEGATIVE
Ketones, ur: NEGATIVE mg/dL
Leukocytes,Ua: NEGATIVE
Nitrite: NEGATIVE
Protein, ur: NEGATIVE mg/dL
Specific Gravity, Urine: >1.046 — ABNORMAL HIGH (ref 1.005–1.030)
pH: 6 (ref 5.0–8.0)

## 2021-08-07 LAB — I-STAT CHEM 8, ED
BUN: 31 mg/dL — ABNORMAL HIGH (ref 8–23)
Calcium, Ion: 0.95 mmol/L — ABNORMAL LOW (ref 1.15–1.40)
Chloride: 106 mmol/L (ref 98–111)
Creatinine, Ser: 1.2 mg/dL (ref 0.61–1.24)
Glucose, Bld: 82 mg/dL (ref 70–99)
HCT: 45 % (ref 39.0–52.0)
Hemoglobin: 15.3 g/dL (ref 13.0–17.0)
Potassium: 5.4 mmol/L — ABNORMAL HIGH (ref 3.5–5.1)
Sodium: 139 mmol/L (ref 135–145)
TCO2: 26 mmol/L (ref 22–32)

## 2021-08-07 LAB — PROTIME-INR
INR: 1 (ref 0.8–1.2)
Prothrombin Time: 12.9 seconds (ref 11.4–15.2)

## 2021-08-07 LAB — HEMOGLOBIN A1C
Hgb A1c MFr Bld: 5.5 % (ref 4.8–5.6)
Mean Plasma Glucose: 111.15 mg/dL

## 2021-08-07 LAB — CBG MONITORING, ED: Glucose-Capillary: 75 mg/dL (ref 70–99)

## 2021-08-07 MED ORDER — CLOPIDOGREL BISULFATE 300 MG PO TABS: 300.0000 mg | ORAL_TABLET | Freq: Once | ORAL | Status: DC

## 2021-08-07 MED ORDER — ASPIRIN 81 MG PO TBEC
81.0000 mg | DELAYED_RELEASE_TABLET | Freq: Every day | ORAL | Status: DC
  Administered 2021-08-08: 81 mg via ORAL
  Filled 2021-08-07: qty 1

## 2021-08-07 MED ORDER — CLOPIDOGREL BISULFATE 75 MG PO TABS
75.0000 mg | ORAL_TABLET | Freq: Every day | ORAL | Status: DC
  Administered 2021-08-08: 75 mg via ORAL
  Filled 2021-08-07: qty 1

## 2021-08-07 MED ORDER — CLOPIDOGREL BISULFATE 300 MG PO TABS: 300.0000 mg | ORAL_TABLET | Freq: Every day | ORAL | Status: DC

## 2021-08-07 MED ORDER — IOHEXOL 350 MG/ML SOLN
75.0000 mL | Freq: Once | INTRAVENOUS | Status: AC | PRN
  Administered 2021-08-07: 75 mL via INTRAVENOUS

## 2021-08-07 MED ORDER — ASPIRIN 325 MG PO TABS
325.0000 mg | ORAL_TABLET | Freq: Once | ORAL | Status: AC
  Administered 2021-08-07: 325 mg via ORAL
  Filled 2021-08-07: qty 1

## 2021-08-07 MED ORDER — SODIUM CHLORIDE 0.9% FLUSH: 3.0000 mL | Freq: Once | INTRAVENOUS | Status: DC

## 2021-08-07 MED ORDER — CLOPIDOGREL BISULFATE 300 MG PO TABS
300.0000 mg | ORAL_TABLET | Freq: Once | ORAL | Status: AC
Start: 2021-08-07 — End: 2021-08-07
  Administered 2021-08-07: 300 mg via ORAL
  Filled 2021-08-07: qty 1

## 2021-08-07 NOTE — ED Triage Notes (Signed)
Pt bib [facility] as a code stroke. LKN 1910, pt was eating dinner with wife when wife noticed slurred speech. Mild L arm weakness noted with EMS. Pt covid +, axo x4.

## 2021-08-07 NOTE — Consult Note (Signed)
Neurology Consultation Reason for Consult: Code stroke Requesting Physician: Isla Pence  CC: Slurred speech  History is obtained from: Patient, chart review and wife at bedside  HPI: Terry [name] is a 71 y.o. 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  He tested positive for COVID 8 days ago and remains positive on antigen testing today.  His wife additionally has developed COVID.  However his symptoms have improved he has not been feverish in the last few days, he did have diarrhea but no blood in his stool or blood in his urine.  No recent bleeding events.  No recent medical procedures.  LKW: 7:15 PM tPA given?: No, too mild to treat and symptoms resolving on admission, but will continue close neurochecks until he is out of the window IA performed?: No,  Premorbid modified rankin scale:      0 - No symptoms  ROS: All other review of systems was negative except as noted in the HPI.    Please see linked chart, pertinent in HPI above No past medical history on file.  Please see linked chart No family history on file.  Social History:  has no history on file for tobacco use, alcohol use, and drug use.  From 11/22 office note in linked chart: Past Medical History:  Diagnosis Date   Aortic root dilation ([place])      a. mild by CT 10/2017.   Basal cell carcinoma 10/02/2019    left hand bcc nod.   Cardiomyopathy [facility])     Central retinal vein occlusion     Chronic systolic CHF (congestive heart failure) (HCC)     ED (erectile dysfunction)     H/O cardiac catheterization 2002 - normal cors   HTN (hypertension)     Hyperlipidemia     Hypokalemia     Leg edema     LV dysfunction      a. EF appeared mildly depressed by TEE 08/2015,  no % given.   Mini stroke      a. ? Age 69 at time of central retinal vein occlusion - lost vision in eye   PAF (paroxysmal atrial fibrillation) ([place])      a. Dx 06/2015, s/p TEE/DCCV 08/2015.   Pericarditis 2010   Syncope      Hx of thought to be due to orthostatic hypotension   Visit for monitoring Tikosyn therapy 01/17/2018         Past Surgical History:  Procedure Laterality Date   CARDIAC CATHETERIZATION   2002   CARDIOVERSION N/A 09/08/2015    Procedure: CARDIOVERSION;  Surgeon: Fay Records, MD;  Location: [facility];  Service: Cardiovascular;  Laterality: N/A;   CARDIOVERSION N/A 06/28/2017    Procedure: CARDIOVERSION;  Surgeon: Jerline Pain, MD;  Location: [facility] ENDOSCOPY;  Service: Cardiovascular;  Laterality: N/A;   HAND SURGERY   2003   KNEE SURGERY       NASAL SINUS SURGERY   2003    x2. Scar tissue build up and had to recreate his tear duct   PERICARDIECTOMY       TEE WITHOUT CARDIOVERSION N/A 09/08/2015    Procedure: TRANSESOPHAGEAL ECHOCARDIOGRAM (TEE);  Surgeon: Fay Records, MD;  Location[facility] ENDOSCOPY;  Service: Cardiovascular;  Laterality: N/A;   TEE WITHOUT CARDIOVERSION N/A 06/28/2017    Procedure:  TRANSESOPHAGEAL ECHOCARDIOGRAM (TEE);  Surgeon: Jerline Pain, MD;  Location: [facility] ENDOSCOPY;  Service: Cardiovascular;  Laterality: N/A;         Family History  Problem Relation Age of Onset   Hypertension Mother     Diabetes Mother     Cancer Mother     Atrial fibrillation Father     Heart disease Father          had pacemaker   Hypertension Father     Diabetes Father     Atrial fibrillation Brother     Heart attack Brother 3    Social History         Tobacco Use   Smoking status: Never   Smokeless tobacco: Never  Substance Use Topics   Alcohol use: Not Currently      Comment: 2-3 Times a Week Wine with Meals     Outpatient Medications Prior to Visit  Medication Sig Dispense Refill   dofetilide (TIKOSYN) 250 MCG capsule Take 1 capsule (250 mcg  total) by mouth 2 (two) times daily. 180 capsule 3   fluticasone (FLONASE) 50 MCG/ACT nasal spray Place 2 sprays into both nostrils every evening.        magnesium oxide (MAGOX 400) 400 (240 Mg) MG tablet Take 1 tablet (400 mg total) by mouth daily. 90 tablet 3   Probiotic Product (PROBIOTIC DAILY PO) Take 1 capsule by mouth daily.       rosuvastatin (CRESTOR) 5 MG tablet Take 5 mg by mouth every evening.       metoprolol succinate (TOPROL-XL) 50 MG 24 hr tablet Take 50 mg by mouth daily.          Exam: Current vital signs: BP (!) 155/100   Pulse 70   Temp 97.6 F (36.4 C) (Oral)   Resp (!) 24   Wt 80.2 kg   SpO2 97%  Vital signs in last 24 hours: Temp:  [97.6 F (36.4 C)] 97.6 F (36.4 C) (05/26 2031) Pulse Rate:  [62-74] 70 (05/26 2100) Resp:  [12-24] 24 (05/26 2100) BP: (155-162)/(99-105) 155/100 (05/26 2100) SpO2:  [97 %-99 %] 97 % (05/26 2100) Weight:  [80.2 kg] 80.2 kg (05/26 2000)   Physical Exam  Constitutional: Appears well-developed and well-nourished.  Psych: Affect appropriate to situation, calm and cooperative Eyes: No scleral injection HENT: No oropharyngeal obstruction.  MSK: no joint deformities.  Cardiovascular: Normal rate and regular rhythm. Perfusing extremities well Respiratory: Effort normal, non-labored breathing GI: Soft.  No distension. There is no tenderness.  Skin: Warm dry and intact visible skin  Neuro: Mental Status: Patient is awake, alert, oriented to person, place, but reports it is June, is oriented to year, and situation. Patient is able to give a clear and coherent history. No signs of aphasia or neglect Cranial Nerves: II: Visual Fields are full. Pupils are round, and reactive to light, both have had lens replacements and the left pupil is slightly larger than the right but both are reactive III,IV, VI: EOMI without ptosis or diploplia.  V: Facial sensation is symmetric to temperature VII: Facial movement is notable for a left  facial droop which is improving over the course of my evaluation VIII: hearing is intact to voice X: Uvula elevates symmetrically XI: Shoulder shrug is symmetric. XII: tongue is midline without atrophy or fasciculations.  Motor: Tone is normal. Bulk is normal. 5/5 strength was present in all four extremities.  On initial evaluation he had slight pronation of the left upper extremity  without drift.  This resolved by the time he was taken from CT scanner to his room Sensory: Sensation is symmetric to light touch and temperature in the arms and legs. Deep Tendon Reflexes: 3+ and symmetric in the brachioradialis and patellae.  Plantars: Toes are downgoing bilaterally.  Cerebellar FNF and HKS are intact bilaterally Gait:  Deferred in acute setting   NIHSS total 3  Score breakdown: Incorrectly reported the month is June, left facial droop, and mild dysarthria one-point each Performed at time of patient arrival to ED    I have reviewed labs in epic and the results pertinent to this consultation are:  Basic Metabolic Panel: Recent Labs  Lab 08/07/21 02-Jun-2007 08/07/21 2015  NA 137 139  K 5.8* 5.4*  CL 108 106  CO2 20*  --   GLUCOSE 80 82  BUN 20 31*  CREATININE 1.17 1.20  CALCIUM 8.8*  --     CBC: Recent Labs  Lab 08/07/21 06/02/07 08/07/21 2015  WBC 4.9  --   NEUTROABS 2.6  --   HGB 15.3 15.3  HCT 48.2 45.0  MCV 96.8  --   PLT 182  --     Coagulation Studies: Recent Labs    08/07/21 06-02-2007  LABPROT 12.9  INR 1.0      I have reviewed the images obtained: Head CT negative for any acute intracranial process, remote left parietal/occipital stroke CTA personally reviewed, agree with radiology that there is no large vessel occlusion or significant stenosis in the head or neck  Impression: 71 year old man with multiple vascular risk factors as above.  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  There is possibility of worsening symptoms, which was reviewed  with family, but given his history of spontaneous retroperitoneal bleed on Eliquis 1 year ago and minimal symptoms at this time, risks of TNK are not insubstantial and outweigh the benefits at this time  Recommendations: -Every 15 minute neurochecks until patient is out of the window -At this time risks of TNK outweigh benefit given his history of RP bleed and minimal symptoms -Extensive discussion with family about the nature of stuttering lacunar strokes and the possibility of worsening symptoms -If no intervention, will be medicine admission w/ stroke team following in consultation -Permissive hypertension -325 mg aspirin, 300 mg Plavix followed by 81 mg and 75 mg daily respectively, for a 21-day course of dual antiplatelet therapy, followed by lifelong aspirin monotherapy -Adjust outpatient Crestor 5 mg if LDL is above goal of 70 -MRI brain -A1c, lipid panel, UA, UDS -Echocardiogram  Lesleigh Noe MD-PhD [facility] [phone] Available 7 PM to 7 AM, outside of these hours please call Neurologist on call as listed on Amion.   Total critical care time: 50 minutes   Critical care time was exclusive of separately billable procedures and treating other patients.   Critical care was necessary to treat or prevent imminent or life-threatening deterioration, highly complex decision making regarding risk and benefit of acute thrombolytic treatment in the setting of acute neurological changes   Critical care was time spent personally by me on the following activities: development of treatment plan with patient and/or surrogate as well as nursing, discussions with consultants/primary team, evaluation of patient's response to treatment, examination of patient, obtaining history from patient or surrogate, ordering and performing treatments and interventions, ordering and review of laboratory studies, ordering and review of radiographic studies, and re-evaluation of patient's condition  as needed, as documented above.

## 2021-08-07 NOTE — ED Provider Notes (Signed)
[facility] EMERGENCY DEPARTMENT Provider Note   CSN: [id number] Arrival date & time: 08/07/21  2007  An emergency department physician performed an initial assessment on this suspected stroke patient at 2007.  History  Chief Complaint  Patient presents with   Code Stroke    Terry [name] is a 71 y.o. male.  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  Pt tested + for Covid 8 days ago.  He was sitting down to eat dinner with his wife who noticed he had slurred speech.  His LKN was 1910.  Pt said his covid sx are better.  EMS called a code stroke.        Home Medications Prior to Admission medications   Medication Sig Start Date End Date Taking? Authorizing Provider  acetaminophen (TYLENOL) 500 MG tablet Take 1,000 mg by mouth every 4 (four) hours as needed for moderate pain, headache or fever.   Yes [provider]  Cholecalciferol (VITAMIN D3 ULTRA STRENGTH) 125 MCG (5000 UT) capsule Take 5,000 Units by mouth daily.   Yes [provider]  dofetilide (TIKOSYN) 250 MCG capsule Take 250 mcg by mouth 2 (two) times daily. 07/30/21  Yes [provider]  fluticasone (FLONASE) 50 MCG/ACT nasal spray Place 2 sprays into both nostrils at bedtime. 06/19/21  Yes [provider]  guaiFENesin (MUCINEX) 600 MG 12 hr tablet Take 600 mg by mouth 2 (two) times daily as needed for cough.   Yes [provider]  magnesium oxide (MAG-OX) 400 (240 Mg) MG tablet Take 400 mg by mouth at bedtime. 03/16/21  Yes [provider]  metoprolol succinate (TOPROL-XL) 50 MG 24 hr tablet Take 50 mg by mouth at bedtime. 05/30/21  Yes [provider]  rosuvastatin (CRESTOR) 5 MG tablet Take 5 mg  by mouth at bedtime. 06/26/21  Yes [provider]  zinc gluconate 50 MG tablet Take 50 mg by mouth daily.   Yes [provider]      Allergies    Penicillins    Review of Systems   Review of Systems  Neurological:  Positive for speech difficulty.  All other systems reviewed and are negative.  Physical Exam Updated Vital Signs BP (!) 140/102   Pulse (!) 56   Temp 97.6 F (36.4 C) (Oral)   Resp (!) 21   Ht 6' (1.829 m)   Wt 77.1 kg   SpO2 96%   BMI 23.06 kg/m  Physical Exam Vitals and nursing note reviewed.  Constitutional:      Appearance: Normal appearance.  HENT:     Head: Normocephalic and atraumatic.     Right Ear: External ear normal.     Left Ear: External ear normal.     Nose: Nose normal.     Mouth/Throat:     Mouth: Mucous membranes are moist.     Pharynx: Oropharynx is clear.  Eyes:     Extraocular Movements: Extraocular movements intact.     Conjunctiva/sclera: Conjunctivae normal.     Pupils: Pupils are equal, round, and reactive to light.  Cardiovascular:     Rate and Rhythm: Normal rate and regular rhythm.     Pulses: Normal pulses.     Heart sounds: Normal heart sounds.  Pulmonary:     Effort:  Pulmonary effort is normal.     Breath sounds: Normal breath sounds.  Abdominal:     General: Abdomen is flat. Bowel sounds are normal.     Palpations: Abdomen is soft.  Musculoskeletal:        General: Normal range of motion.     Cervical back: Normal range of motion and neck supple.  Skin:    General: Skin is warm.     Capillary Refill: Capillary refill takes less than 2 seconds.  Neurological:     Mental Status: He is alert and oriented to person, place, and time.     Comments: Mild left facial droop, mild dysarthria  Psychiatric:        Mood and Affect: Mood normal.        Behavior: Behavior normal.        Thought Content: Thought content normal.        Judgment: Judgment normal.    ED Results / Procedures / Treatments    Labs (all labs ordered are listed, but only abnormal results are displayed) Labs Reviewed  COMPREHENSIVE METABOLIC PANEL - Abnormal; Notable for the following components:      Result Value   Potassium 5.8 (*)    CO2 20 (*)    Calcium 8.8 (*)    Total Protein 5.8 (*)    Albumin 3.4 (*)    Total Bilirubin 1.9 (*)    All other components within normal limits  URINALYSIS, ROUTINE W REFLEX MICROSCOPIC - Abnormal; Notable for the following components:   Specific Gravity, Urine >1.046 (*)    All other components within normal limits  I-STAT CHEM 8, ED - Abnormal; Notable for the following components:   Potassium 5.4 (*)    BUN 31 (*)    Calcium, Ion 0.95 (*)    All other components within normal limits  PROTIME-INR  APTT  CBC  DIFFERENTIAL  HEMOGLOBIN A1C  RAPID URINE DRUG SCREEN, HOSP PERFORMED  LIPID PANEL  CBG MONITORING, ED    EKG EKG Interpretation  Date/Time:  Friday Aug 07 2021 20:32:47 EDT Ventricular Rate:  75 PR Interval:  205 QRS Duration: 85 QT Interval:  424 QTC Calculation: 474 R Axis:   39 Text Interpretation: Sinus rhythm No old tracing to compare Confirmed by Isla Pence [phone]) on 08/07/2021 9:28:46 PM  Radiology CT HEAD CODE STROKE WO CONTRAST  Result Date: 08/07/2021 CLINICAL DATA:  Code stroke.  Slurred speech EXAM: CT HEAD WITHOUT CONTRAST TECHNIQUE: Contiguous axial images were obtained from the base of the skull through the vertex without intravenous contrast. RADIATION DOSE REDUCTION: This exam was performed according to the departmental dose-optimization program which includes automated exposure control, adjustment of the mA and/or kV according to patient size and/or use of iterative reconstruction technique. COMPARISON:  None Available. FINDINGS: Brain: No evidence of acute infarction, hemorrhage, cerebral edema, mass, mass effect, or midline shift. Ventricles and sulci are normal for age. No extra-axial fluid collection. Hypodensity in the left  parieto-occipital region, consistent with a remote infarct. Vascular: No hyperdense vessel. Atherosclerotic calcifications in the intracranial carotid and vertebral arteries. No Skull: Negative for fracture or focal lesion. Sinuses/Orbits: Mucosal thickening in the frontal sinuses and remaining ethmoid air cells, pass post partial ethmoidectomy and right maxillary antrostomy. Right sinonasal polyp (series 4, image 15). Other: The mastoid air cells are well aerated. ASPECTS [facility] Stroke Program Early CT Score) - Ganglionic level infarction (caudate, lentiform nuclei, internal capsule, insula, M1-M3 cortex): 7 - Supraganglionic infarction (M4-M6 cortex): 3  Total score (0-10 with 10 being normal): 10 IMPRESSION: 1. No acute intracranial process. 2. ASPECTS is 10 Code stroke imaging results were communicated on 08/07/2021 at 8:23 pm to provider BHAGAT via secure text paging. Electronically Signed   By: Merilyn Baba M.D.   On: 08/07/2021 20:24   CT ANGIO HEAD NECK W WO CM (CODE STROKE)  Result Date: 08/07/2021 CLINICAL DATA:  Slurred speech, stroke suspected EXAM: CT ANGIOGRAPHY HEAD AND NECK TECHNIQUE: Multidetector CT imaging of the head and neck was performed using the standard protocol during bolus administration of intravenous contrast. Multiplanar CT image reconstructions and MIPs were obtained to evaluate the vascular anatomy. Carotid stenosis measurements (when applicable) are obtained utilizing NASCET criteria, using the distal internal carotid diameter as the denominator. RADIATION DOSE REDUCTION: This exam was performed according to the departmental dose-optimization program which includes automated exposure control, adjustment of the mA and/or kV according to patient size and/or use of iterative reconstruction technique. CONTRAST:  42m OMNIPAQUE IOHEXOL 350 MG/ML SOLN COMPARISON:  No prior CTA, correlation is made with CT head 08/07/2021 FINDINGS: CT HEAD FINDINGS For noncontrast findings, please see  same day CT head. CTA NECK FINDINGS Aortic arch: Standard branching. Imaged portion shows no evidence of aneurysm or dissection. No significant stenosis of the major arch vessel origins. Right carotid system: No evidence of dissection, occlusion, or hemodynamically significant stenosis (greater than 50%). Left carotid system: No evidence of dissection, occlusion, or hemodynamically significant stenosis (greater than 50%). Vertebral arteries: No evidence of dissection, occlusion, or hemodynamically significant stenosis (greater than 50%). Skeleton: No acute osseous abnormality. Other neck: No acute finding. Upper chest: No focal pulmonary opacity or pleural effusion. Review of the MIP images confirms the above findings CTA HEAD FINDINGS Anterior circulation: Both internal carotid arteries are patent to the termini, without significant stenosis. A1 segments patent. Normal anterior communicating artery. Anterior cerebral arteries are patent to their distal aspects. No M1 stenosis or occlusion. Normal MCA bifurcations. Distal MCA branches perfused and symmetric. Posterior circulation: Vertebral arteries patent to the vertebrobasilar junction without stenosis. Posterior inferior cerebellar arteries patent proximally. Basilar patent to its distal aspect. Superior cerebellar arteries patent proximally. Patent P1 segments. Mild irregularity in the left P2 without significant stenosis. PCAs perfused to their distal aspects without stenosis. The bilateral posterior communicating arteries are not visualized. Venous sinuses: As permitted by contrast timing, patent. Anatomic variants: None significant. Review of the MIP images confirms the above findings IMPRESSION: 1.  No intracranial large vessel occlusion or significant stenosis. 2.  No hemodynamically significant stenosis in the neck. Electronically Signed   By: AMerilyn BabaM.D.   On: 08/07/2021 21:18    Procedures Procedures    Medications Ordered in ED Medications   sodium chloride flush (NS) 0.9 % injection 3 mL (3 mLs Intravenous Not Given 08/07/21 2035)  aspirin tablet 325 mg (325 mg Oral Given 08/07/21 2156)    Followed by  aspirin EC tablet 81 mg (has no administration in time range)  clopidogrel (PLAVIX) tablet 300 mg (300 mg Oral Given 08/07/21 2156)    Followed by  clopidogrel (PLAVIX) tablet 75 mg (has no administration in time range)  iohexol (OMNIPAQUE) 350 MG/ML injection 75 mL (75 mLs Intravenous Contrast Given 08/07/21 2028)    ED Course/ Medical Decision Making/ A&P                           Medical Decision Making Amount and/or Complexity of Data  Reviewed Labs: ordered. Radiology: ordered.  Risk Decision regarding hospitalization.   This patient presents to the ED for concern of cva, this involves an extensive number of treatment options, and is a complaint that carries with it a high risk of complications and morbidity.  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  The pertinent results include:  cbc nl, bmp nl other than elevated K at 5.8.     Imaging Studies ordered:  I ordered imaging studies including ct head, ct angio  I independently visualized and interpreted imaging which showed  CT head:   IMPRESSION:  1. No acute intracranial process.  2. ASPECTS is 10  CT angio:   IMPRESSION:  1.  No intracranial large vessel occlusion or significant stenosis.  2.  No hemodynamically  significant stenosis in the neck.      I agree with the radiologist interpretation   Cardiac Monitoring:  The patient was maintained on a cardiac monitor.  I personally viewed and interpreted the cardiac monitored which showed an underlying rhythm of: nsr   Medicines ordered and prescription drug management:  I ordered medication including asa and plavix  for cva  Reevaluation of the patient after these medicines showed that the patient improved I have reviewed the patients home medicines and have made adjustments as needed   Test Considered:  MRI brain   Critical Interventions:  Code stroke   Consultations Obtained:  I requested consultation with the neurologist (Dr. Curly Shores),  and discussed lab and imaging findings as well as pertinent plan - She saw pt upon arrival.  Pt    Problem List / ED Course:  Dysarthria:  likely CVA.  Dr. Curly Shores gave pt a NIHSS score of 3.  Risks of TNK outweigh benefits since sx are not severe and pt had a spontaneous retroperitoneal bleed on Eliquis last year.  She recommends q15 min neurochecks until pt is out of the window (4.5 hrs from 1915 which is 2345) then admit to medicine. Pt d/w Dr. Marlowe Sax ([facility])   Reevaluation:  After the interventions noted above, I reevaluated the patient and found that they have :improved   Social Determinants of Health:  Lives at home with his wife   Dispostion:  After consideration of the diagnostic results and the patients response to treatment, I feel that the patent would benefit from admission.          Final Clinical Impression(s) / ED Diagnoses Final diagnoses:  Cerebrovascular accident (CVA), unspecified mechanism ([place])    Rx / [place] Orders ED Discharge Orders     None         Isla Pence, MD 08/07/21 2344

## 2021-08-07 NOTE — Code Documentation (Addendum)
Responded to Code Stroke called at 2002 for slurred speech, [id number]. Pt arrived at 2007, CBG-75, NIH-3, CT head negative for acute changes, CTA-no LVO.  TNK not given-too good to treat. Pt remains in TNK window until 2340.  Q42mn VS and q380m neuro checks until then. Plan for stroke workup.

## 2021-08-07 NOTE — ED Notes (Signed)
Patient transported to MRI 

## 2021-08-08 ENCOUNTER — Encounter (HOSPITAL_COMMUNITY): Payer: Self-pay | Admitting: Internal Medicine

## 2021-08-08 ENCOUNTER — Inpatient Hospital Stay (HOSPITAL_COMMUNITY): Payer: PPO

## 2021-08-08 DIAGNOSIS — E7800 Pure hypercholesterolemia, unspecified: Secondary | ICD-10-CM | POA: Diagnosis not present

## 2021-08-08 DIAGNOSIS — I480 Paroxysmal atrial fibrillation: Secondary | ICD-10-CM | POA: Diagnosis not present

## 2021-08-08 DIAGNOSIS — I63411 Cerebral infarction due to embolism of right middle cerebral artery: Secondary | ICD-10-CM

## 2021-08-08 DIAGNOSIS — I639 Cerebral infarction, unspecified: Secondary | ICD-10-CM | POA: Diagnosis not present

## 2021-08-08 LAB — COMPREHENSIVE METABOLIC PANEL
ALT: 17 U/L (ref 0–44)
AST: 16 U/L (ref 15–41)
Albumin: 3.1 g/dL — ABNORMAL LOW (ref 3.5–5.0)
Alkaline Phosphatase: 39 U/L (ref 38–126)
Anion gap: 7 (ref 5–15)
BUN: 16 mg/dL (ref 8–23)
CO2: 25 mmol/L (ref 22–32)
Calcium: 8.6 mg/dL — ABNORMAL LOW (ref 8.9–10.3)
Chloride: 107 mmol/L (ref 98–111)
Creatinine, Ser: 1.06 mg/dL (ref 0.61–1.24)
GFR, Estimated: >60 mL/min (ref >60)
Glucose, Bld: 169 mg/dL — ABNORMAL HIGH (ref 70–99)
Potassium: 3.5 mmol/L (ref 3.5–5.1)
Sodium: 139 mmol/L (ref 135–145)
Total Bilirubin: 0.6 mg/dL (ref 0.3–1.2)
Total Protein: 5.5 g/dL — ABNORMAL LOW (ref 6.5–8.1)

## 2021-08-08 LAB — ECHOCARDIOGRAM COMPLETE
AR max vel: 3.41 cm2
AV Area VTI: 2.93 cm2
AV Area mean vel: 3.34 cm2
AV Mean grad: 3 mmHg
AV Peak grad: 5.4 mmHg
Ao pk vel: 1.16 m/s
Area-P 1/2: 2.83 cm2
Calc EF: 66.6 %
Height: 72 in
S' Lateral: 3.6 cm
Single Plane A2C EF: 59.9 %
Single Plane A4C EF: 75.3 %
Weight: 2720 oz

## 2021-08-08 LAB — LIPID PANEL
Cholesterol: 144 mg/dL (ref 0–200)
HDL: 33 mg/dL — ABNORMAL LOW (ref >40)
LDL Cholesterol: 87 mg/dL (ref 0–99)
Total CHOL/HDL Ratio: 4.4 RATIO
Triglycerides: 121 mg/dL (ref <150)
VLDL: 24 mg/dL (ref 0–40)

## 2021-08-08 LAB — D-DIMER, QUANTITATIVE: D-Dimer, Quant: 0.39 ug/mL-FEU (ref 0.00–0.50)

## 2021-08-08 LAB — HIV ANTIBODY (ROUTINE TESTING W REFLEX): HIV Screen 4th Generation wRfx: NONREACTIVE

## 2021-08-08 LAB — GLUCOSE, CAPILLARY: Glucose-Capillary: 98 mg/dL (ref 70–99)

## 2021-08-08 LAB — FIBRINOGEN: Fibrinogen: 447 mg/dL (ref 210–475)

## 2021-08-08 LAB — C-REACTIVE PROTEIN: CRP: <0.5 mg/dL (ref <1.0)

## 2021-08-08 LAB — POTASSIUM: Potassium: 3.7 mmol/L (ref 3.5–5.1)

## 2021-08-08 LAB — SARS CORONAVIRUS 2 BY RT PCR: SARS Coronavirus 2 by RT PCR: POSITIVE — AB

## 2021-08-08 LAB — LACTATE DEHYDROGENASE: LDH: 116 U/L (ref 98–192)

## 2021-08-08 LAB — FERRITIN: Ferritin: 121 ng/mL (ref 24–336)

## 2021-08-08 MED ORDER — ROSUVASTATIN CALCIUM 5 MG PO TABS: 5.0000 mg | ORAL_TABLET | Freq: Every day | ORAL | Status: DC

## 2021-08-08 MED ORDER — ACETAMINOPHEN 325 MG PO TABS: 650.0000 mg | ORAL_TABLET | ORAL | Status: DC | PRN

## 2021-08-08 MED ORDER — SODIUM ZIRCONIUM CYCLOSILICATE 10 G PO PACK
10.0000 g | PACK | Freq: Two times a day (BID) | ORAL | Status: DC
  Administered 2021-08-08: 10 g via ORAL
  Filled 2021-08-08: qty 1

## 2021-08-08 MED ORDER — RIVAROXABAN 20 MG PO TABS
20.0000 mg | ORAL_TABLET | Freq: Every day | ORAL | Status: DC
  Administered 2021-08-08: 20 mg via ORAL
  Filled 2021-08-08: qty 1

## 2021-08-08 MED ORDER — PANTOPRAZOLE SODIUM 40 MG PO TBEC
40.0000 mg | DELAYED_RELEASE_TABLET | Freq: Every day | ORAL | Status: DC
  Administered 2021-08-08 – 2021-08-09 (×2): 40 mg via ORAL
  Filled 2021-08-08 (×2): qty 1

## 2021-08-08 MED ORDER — ROSUVASTATIN CALCIUM 5 MG PO TABS
10.0000 mg | ORAL_TABLET | Freq: Every day | ORAL | Status: DC
  Administered 2021-08-08: 10 mg via ORAL
  Filled 2021-08-08: qty 2

## 2021-08-08 MED ORDER — PERFLUTREN LIPID MICROSPHERE
1.0000 mL | INTRAVENOUS | Status: AC | PRN
  Administered 2021-08-08: 2 mL via INTRAVENOUS

## 2021-08-08 MED ORDER — POTASSIUM CHLORIDE CRYS ER 20 MEQ PO TBCR
40.0000 meq | EXTENDED_RELEASE_TABLET | Freq: Once | ORAL | Status: AC
  Administered 2021-08-08: 40 meq via ORAL
  Filled 2021-08-08: qty 2

## 2021-08-08 MED ORDER — RIVAROXABAN 20 MG PO TABS: 20.0000 mg | ORAL_TABLET | Freq: Every day | ORAL | Status: DC

## 2021-08-08 MED ORDER — ACETAMINOPHEN 160 MG/5ML PO SOLN: 650.0000 mg | ORAL | Status: DC | PRN

## 2021-08-08 MED ORDER — INSULIN ASPART 100 UNIT/ML IJ SOLN
5.0000 [IU] | Freq: Once | INTRAMUSCULAR | Status: AC
  Administered 2021-08-08: 5 [IU] via INTRAVENOUS

## 2021-08-08 MED ORDER — SODIUM POLYSTYRENE SULFONATE 15 GM/60ML PO SUSP
30.0000 g | Freq: Once | ORAL | Status: AC
  Administered 2021-08-08: 30 g via ORAL
  Filled 2021-08-08: qty 120

## 2021-08-08 MED ORDER — LACTATED RINGERS IV BOLUS
500.0000 mL | Freq: Once | INTRAVENOUS | Status: AC
  Administered 2021-08-08: 500 mL via INTRAVENOUS

## 2021-08-08 MED ORDER — STROKE: EARLY STAGES OF RECOVERY BOOK
Freq: Once | Status: AC
  Filled 2021-08-08: qty 1

## 2021-08-08 MED ORDER — ACETAMINOPHEN 650 MG RE SUPP: 650.0000 mg | RECTAL | Status: DC | PRN

## 2021-08-08 MED ORDER — DOFETILIDE 250 MCG PO CAPS
250.0000 ug | ORAL_CAPSULE | Freq: Two times a day (BID) | ORAL | Status: DC
  Administered 2021-08-08 – 2021-08-09 (×3): 250 ug via ORAL
  Filled 2021-08-08 (×4): qty 1

## 2021-08-08 MED ORDER — DEXTROSE 50 % IV SOLN
1.0000 | Freq: Once | INTRAVENOUS | Status: AC
  Administered 2021-08-08: 50 mL via INTRAVENOUS
  Filled 2021-08-08: qty 50

## 2021-08-08 NOTE — Progress Notes (Signed)
STROKE TEAM PROGRESS NOTE   SUBJECTIVE (INTERVAL HISTORY) His daughter is at the bedside.  Overall his condition is stable.  Currently neuro stable.  Patient was on Eliquis for PAF, follows with Dr. Einar Gip as outpatient.  Last year, 08/2020 he had acute severe retroperitoneal hemorrhage, consider spontaneous event.  Therefore, Eliquis was discontinued since.  Patient was not on any antithrombotic PTA.   OBJECTIVE Temp:  [97.6 F (36.4 C)-98 F (36.7 C)] 98 F (36.7 C) (05/27 0255) Pulse Rate:  [41-74] 57 (05/27 1142) Cardiac Rhythm: Sinus bradycardia (05/27 0100) Resp:  [12-24] 13 (05/27 1142) BP: (134-162)/(82-131) 147/96 (05/27 1142) SpO2:  [87 %-100 %] 98 % (05/27 1142) Weight:  [77.1 kg-80.2 kg] 77.1 kg (05/26 2202)  Recent Labs  Lab 08/07/21 2011 08/08/21 1403  GLUCAP 75 98   Recent Labs  Lab 08/07/21 2009 08/07/21 2015 08/08/21 0558  NA 137 139 139  K 5.8* 5.4* 3.5  CL 108 106 107  CO2 20*  --  25  GLUCOSE 80 82 169*  BUN 20 31* 16  CREATININE 1.17 1.20 1.06  CALCIUM 8.8*  --  8.6*   Recent Labs  Lab 08/07/21 2009 08/08/21 0558  AST 39 16  ALT 12 17  ALKPHOS 43 39  BILITOT 1.9* 0.6  PROT 5.8* 5.5*  ALBUMIN 3.4* 3.1*   Recent Labs  Lab 08/07/21 2009 08/07/21 2015  WBC 4.9  --   NEUTROABS 2.6  --   HGB 15.3 15.3  HCT 48.2 45.0  MCV 96.8  --   PLT 182  --    No results for input(s): CKTOTAL, CKMB, CKMBINDEX, TROPONINI in the last 168 hours. Recent Labs    08/07/21 2009  LABPROT 12.9  INR 1.0   Recent Labs    08/07/21 2200  COLORURINE YELLOW  LABSPEC >1.046*  PHURINE 6.0  GLUCOSEU NEGATIVE  HGBUR NEGATIVE  BILIRUBINUR NEGATIVE  KETONESUR NEGATIVE  PROTEINUR NEGATIVE  NITRITE NEGATIVE  LEUKOCYTESUR NEGATIVE    No results found for: CHOL, TRIG, HDL, CHOLHDL, VLDL, LDLCALC Lab Results  Component Value Date   HGBA1C 5.5 08/07/2021      Component Value Date/Time   LABOPIA NONE DETECTED 08/07/2021 2200   COCAINSCRNUR NONE DETECTED  08/07/2021 2200   LABBENZ NONE DETECTED 08/07/2021 2200   AMPHETMU NONE DETECTED 08/07/2021 2200   THCU NONE DETECTED 08/07/2021 2200   LABBARB NONE DETECTED 08/07/2021 2200    No results for input(s): ETH in the last 168 hours.  I have personally reviewed the radiological images below and agree with the radiology interpretations.  MR BRAIN WO CONTRAST  Result Date: 08/08/2021 CLINICAL DATA:  Slurred speech, stroke suspected EXAM: MRI HEAD WITHOUT CONTRAST TECHNIQUE: Multiplanar, multiecho pulse sequences of the brain and surrounding structures were obtained without intravenous contrast. COMPARISON:  No prior MRI, correlation is made with 08/07/2021 CT head FINDINGS: Brain: Small areas of mildly increased signal on diffusion-weighted imaging with ADC correlate in the right frontal lobe (series 5, images 86-90) and right occipital lobe (series 5, images 70-71). No acute hemorrhage, mass, mass effect, or midline shift. No hydrocephalus or extra-axial collection. Encephalomalacia in the left parietal and occipital lobes is consistent with remote infarct. Scattered T2 hyperintense signal in the periventricular white matter, likely the sequela of chronic small vessel ischemic disease. Punctate focus of hemosiderin deposition in the left parietal lobe (series 14, image 40), possibly hypertensive microhemorrhage. Vascular: Normal arterial flow voids. Skull and upper cervical spine: Normal marrow signal. Sinuses/Orbits: Mucosal thickening throughout the  paranasal sinuses, with sequela of prior endoscopic sinus surgery, with a right nasal polyp. The orbits are unremarkable. Other: The mastoids are well aerated. IMPRESSION: Small acute and/or subacute infarcts in the right frontal lobe and right occipital lobe. Code stroke imaging results were communicated on 08/08/2021 at 12:49 am to provider BHAGAT via secure text paging. Electronically Signed   By: Merilyn Baba M.D.   On: 08/08/2021 00:50   CT HEAD CODE  STROKE WO CONTRAST  Result Date: 08/07/2021 CLINICAL DATA:  Code stroke.  Slurred speech EXAM: CT HEAD WITHOUT CONTRAST TECHNIQUE: Contiguous axial images were obtained from the base of the skull through the vertex without intravenous contrast. RADIATION DOSE REDUCTION: This exam was performed according to the departmental dose-optimization program which includes automated exposure control, adjustment of the mA and/or kV according to patient size and/or use of iterative reconstruction technique. COMPARISON:  None Available. FINDINGS: Brain: No evidence of acute infarction, hemorrhage, cerebral edema, mass, mass effect, or midline shift. Ventricles and sulci are normal for age. No extra-axial fluid collection. Hypodensity in the left parieto-occipital region, consistent with a remote infarct. Vascular: No hyperdense vessel. Atherosclerotic calcifications in the intracranial carotid and vertebral arteries. No Skull: Negative for fracture or focal lesion. Sinuses/Orbits: Mucosal thickening in the frontal sinuses and remaining ethmoid air cells, pass post partial ethmoidectomy and right maxillary antrostomy. Right sinonasal polyp (series 4, image 15). Other: The mastoid air cells are well aerated. ASPECTS [facility] Stroke Program Early CT Score) - Ganglionic level infarction (caudate, lentiform nuclei, internal capsule, insula, M1-M3 cortex): 7 - Supraganglionic infarction (M4-M6 cortex): 3 Total score (0-10 with 10 being normal): 10 IMPRESSION: 1. No acute intracranial process. 2. ASPECTS is 10 Code stroke imaging results were communicated on 08/07/2021 at 8:23 pm to provider BHAGAT via secure text paging. Electronically Signed   By: Merilyn Baba M.D.   On: 08/07/2021 20:24   CT ANGIO HEAD NECK W WO CM (CODE STROKE)  Result Date: 08/07/2021 CLINICAL DATA:  Slurred speech, stroke suspected EXAM: CT ANGIOGRAPHY HEAD AND NECK TECHNIQUE: Multidetector CT imaging of the head and neck was performed using the standard  protocol during bolus administration of intravenous contrast. Multiplanar CT image reconstructions and MIPs were obtained to evaluate the vascular anatomy. Carotid stenosis measurements (when applicable) are obtained utilizing NASCET criteria, using the distal internal carotid diameter as the denominator. RADIATION DOSE REDUCTION: This exam was performed according to the departmental dose-optimization program which includes automated exposure control, adjustment of the mA and/or kV according to patient size and/or use of iterative reconstruction technique. CONTRAST:  46m OMNIPAQUE IOHEXOL 350 MG/ML SOLN COMPARISON:  No prior CTA, correlation is made with CT head 08/07/2021 FINDINGS: CT HEAD FINDINGS For noncontrast findings, please see same day CT head. CTA NECK FINDINGS Aortic arch: Standard branching. Imaged portion shows no evidence of aneurysm or dissection. No significant stenosis of the major arch vessel origins. Right carotid system: No evidence of dissection, occlusion, or hemodynamically significant stenosis (greater than 50%). Left carotid system: No evidence of dissection, occlusion, or hemodynamically significant stenosis (greater than 50%). Vertebral arteries: No evidence of dissection, occlusion, or hemodynamically significant stenosis (greater than 50%). Skeleton: No acute osseous abnormality. Other neck: No acute finding. Upper chest: No focal pulmonary opacity or pleural effusion. Review of the MIP images confirms the above findings CTA HEAD FINDINGS Anterior circulation: Both internal carotid arteries are patent to the termini, without significant stenosis. A1 segments patent. Normal anterior communicating artery. Anterior cerebral arteries are patent to their  distal aspects. No M1 stenosis or occlusion. Normal MCA bifurcations. Distal MCA branches perfused and symmetric. Posterior circulation: Vertebral arteries patent to the vertebrobasilar junction without stenosis. Posterior inferior  cerebellar arteries patent proximally. Basilar patent to its distal aspect. Superior cerebellar arteries patent proximally. Patent P1 segments. Mild irregularity in the left P2 without significant stenosis. PCAs perfused to their distal aspects without stenosis. The bilateral posterior communicating arteries are not visualized. Venous sinuses: As permitted by contrast timing, patent. Anatomic variants: None significant. Review of the MIP images confirms the above findings IMPRESSION: 1.  No intracranial large vessel occlusion or significant stenosis. 2.  No hemodynamically significant stenosis in the neck. Electronically Signed   By: Merilyn Baba M.D.   On: 08/07/2021 21:18     PHYSICAL EXAM  Temp:  [97.6 F (36.4 C)-98 F (36.7 C)] 98 F (36.7 C) (05/27 0255) Pulse Rate:  [41-74] 57 (05/27 1142) Resp:  [12-24] 13 (05/27 1142) BP: (134-162)/(82-131) 147/96 (05/27 1142) SpO2:  [87 %-100 %] 98 % (05/27 1142) Weight:  [77.1 kg-80.2 kg] 77.1 kg (05/26 2202)  General - Well nourished, well developed, in no apparent distress.  Ophthalmologic - fundi not visualized due to noncooperation.  Cardiovascular - Regular rhythm and rate.  Mental Status -  Level of arousal and orientation to time, place, and person were intact. Language including expression, naming, repetition, comprehension was assessed and found intact. Fund of Knowledge was assessed and was intact.  Cranial Nerves II - XII - II - Visual field intact OU. III, IV, VI - Extraocular movements intact. V - Facial sensation intact bilaterally. VII - Facial movement intact bilaterally. VIII - Hearing & vestibular intact bilaterally. X - Palate elevates symmetrically. XI - Chin turning & shoulder shrug intact bilaterally. XII - Tongue protrusion intact.  Motor Strength - The patient's strength was normal in all extremities and pronator drift was absent.  Bulk was normal and fasciculations were absent.   Motor Tone - Muscle tone was  assessed at the neck and appendages and was normal.  Reflexes - The patient's reflexes were symmetrical in all extremities and he had no pathological reflexes.  Sensory - Light touch, temperature/pinprick were assessed and were symmetrical.    Coordination - The patient had normal movements in the hands with no ataxia or dysmetria.  Tremor was absent.  Gait and Station - deferred.   ASSESSMENT/PLAN Mr. Josean Lycan is a 71 y.o. male with history of hypertension, hyperlipidemia, PAF not on AC, retroperitoneal hemorrhage 08/2020, cardiomyopathy admitted for slurred speech with positive COVID. No tPA given due to outside window.    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 2D Echo pending LDL 87 HgbA1c 5.5 SCDs for VTE prophylaxis No antithrombotic prior to admission, now on aspirin 81 mg daily and clopidogrel 75 mg daily. Given PAF and risk of severe bleeding on long term AC, will recommend Watchman device. Perioperative antithrombotic regimen per cardiology.  Patient counseled to be compliant with his antithrombotic medications Ongoing aggressive stroke risk factor management Therapy recommendations:  none Disposition:  pending   PAF Was on Eliquis last year Had severe retroperitoneal hemorrhage 08/2020, Eliquis was discontinued after that Given current stroke and risk of severe bleeding on long-term AC, recommend Watchman device.  Perioperative antithrombotic regimen per cardiology  Hypertension Stable Long  term BP goal normotensive  Hyperlipidemia Home meds: Crestor 5 LDL 87, goal < 70 Now on Crestor 10, no high intensity statin given LDL close to goal Continue statin at discharge  COVID infection COVID diagnosed 8 days ago Patient was also COVID positive Largely asymptomatic  now Management per primary team  Other Stroke Risk Factors Advanced age Cardiomyopathy, currently TTE pending  Other Active Problems R eye [facility] day # 1  Neurology will sign off. Please call with questions. Pt will follow up with stroke clinic NP at [facility] in about 4 weeks. Thanks for the consult.   Rosalin Hawking, MD PhD Stroke Neurology 08/08/2021 2:09 PM    To contact Stroke Continuity provider, please refer to [email or web address] After hours, contact General Neurology

## 2021-08-08 NOTE — Plan of Care (Signed)
The patient is admitted from ER to [facility] 04 in stable condition. Denies any acute pain. Full assessment to epic completed. His wife at bedside voiced no concern. Will continue to monitor.

## 2021-08-08 NOTE — Progress Notes (Signed)
PROGRESS NOTE                                                                                                                                                                                                             Patient Demographics:    Terry [name], is a 71 y.o. male, DOB - 06-10-1950, KWI:[id number]  Outpatient Primary MD for the patient is Lawerance Cruel, MD    LOS - 1  Admit date - 08/07/2021    Chief Complaint  Patient presents with   Code Stroke       Brief Narrative (HPI from H&P)    71 y.o. male with medical history significant of CAD, paroxysmal Afib no longer on anticoagulation due to history of spontaneous retroperitoneal bleed in June 2022, cardiomyopathy, history of central retinal venous occlusion, HTN, HLD presented to the ED as code stroke for evaluation of acute onset slurred speech, LKW at 7:15 pm on 08/07/2021.  Patient also had COVID-19 related illness about a week prior to hospital visit, he is fully vaccinated.  In the ER he was diagnosed with CVA causing dysarthria and admitted to the hospital.   Subjective:    Hedy Jacob today has, No headache, No chest pain, No abdominal pain - No Nausea, No new weakness tingling or numbness, for shortness of breath, speech is improving gradually.   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 He has history of A-fib and most likely his strokes are embolic due to cardiac source, full stroke pathway being followed, neurology and cardiology to evaluate.  Of note he was on Eliquis before but it was stopped last year due to spontaneous retroperitoneal bleed.  Clearly his [place] vas 2 score is much higher now.  Risks and benefits of anticoagulation explained to the family, pharmacy, cardiology and neurology will also educate the family.  CTA head and neck  stable, A1c stable, LDL pending, echocardiogram pending.  For now on dual antiplatelet therapy along with statin, likely will be placed on anticoagulation if patient and family agreeable.  2.  COVID-19 infection.  1 week before hospital visit.  Did have febrile illness and cough now resolved, chest x-ray stable, inflammatory markers are unremarkable.  Monitor.  3.  Hyperkalemia.  Resolved will monitor.  4.  Paroxysmal A-fib with [place] vas 2 score of greater than for now.  Embolic stroke as above, continue Tikosyn.  5.  Dyslipidemia.  On statin LDL pending.  6.  Hypertension.  Allow for permissive hypertension due to CVA and monitor.   No results found for: CHOL, HDL, LDLCALC, LDLDIRECT, TRIG, CHOLHDL       Condition - Fair  Family Communication  :  wife bedside 08/08/21  Code Status :  Full  Consults  :  Neuro, Cards  PUD Prophylaxis :     Procedures  :     TTE -  CTA Head and Neck - Non acute  MRI - Small acute and/or subacute infarcts in the right frontal lobe and right occipital lobe.      Disposition Plan  :    Status is: Inpatient  DVT Prophylaxis  :    SCD's Start: 08/08/21 0430    Lab Results  Component Value Date   PLT 182 08/07/2021    Diet :  Diet Order             Diet Heart Room service appropriate? Yes; Fluid consistency: Thin  Diet effective now                    Inpatient Medications  Scheduled Meds:  aspirin EC  81 mg Oral Daily   clopidogrel  75 mg Oral Daily   dofetilide  250 mcg Oral BID   potassium chloride  40 mEq Oral Once   rosuvastatin  5 mg Oral QHS   Continuous Infusions: PRN Meds:.acetaminophen **OR** acetaminophen (TYLENOL) oral liquid 160 mg/5 mL **OR** acetaminophen  Antibiotics  :    Anti-infectives (From admission, onward)    None        Time Spent in minutes  30   Lala Lund M.D on 08/08/2021 at 9:56 AM  To page go to [email or web address]   [facility] -  Office  [phone]  See all  Orders from today for further details    Objective:   Vitals:   08/08/21 0037 08/08/21 0055 08/08/21 0255 08/08/21 0456  BP:  (!) 134/100 (!) 135/92 (!) 157/99  Pulse:  (!) 51 (!) 58 (!) 57  Resp:  '12 15 15  '$ Temp: 98 F (36.7 C)  98 F (36.7 C)   TempSrc: Oral  Axillary   SpO2:  99% 95% 97%  Weight:      Height:        Wt Readings from Last 3 Encounters:  08/07/21 77.1 kg     Intake/Output Summary (Last 24 hours) at 08/08/2021 0956 Last data filed at 08/08/2021 0545 Gross per 24 hour  Intake --  Output 300 ml  Net -300 ml     Physical Exam  Awake Alert, No new F.N deficits, mild dysarthria [place].AT,PERRAL Supple Neck, No JVD,   Symmetrical Chest wall movement, Good air movement bilaterally, CTAB RRR,No Gallops,Rubs or new Murmurs,  +ve B.Sounds, Abd Soft, No tenderness,   No Cyanosis, Clubbing or edema      Data Review:    CBC Recent Labs  Lab 08/07/21 2009 08/07/21 2015  WBC 4.9  --   HGB 15.3 15.3  HCT 48.2 45.0  PLT 182  --   MCV 96.8  --   MCH 30.7  --   MCHC 31.7  --   RDW 13.2  --   LYMPHSABS 1.6  --   MONOABS 0.4  --  EOSABS 0.1  --   BASOSABS 0.1  --     Electrolytes Recent Labs  Lab 08/07/21 2009 08/07/21 2015 08/08/21 0558  NA 137 139 139  K 5.8* 5.4* 3.5  CL 108 106 107  CO2 20*  --  25  GLUCOSE 80 82 169*  BUN 20 31* 16  CREATININE 1.17 1.20 1.06  CALCIUM 8.8*  --  8.6*  AST 39  --  16  ALT 12  --  17  ALKPHOS 43  --  39  BILITOT 1.9*  --  0.6  ALBUMIN 3.4*  --  3.1*  CRP  --   --  <0.5  DDIMER  --   --  0.39  INR 1.0  --   --   HGBA1C 5.5  --   --     ------------------------------------------------------------------------------------------------------------------ No results for input(s): CHOL, HDL, LDLCALC, TRIG, CHOLHDL, LDLDIRECT in the last 72 hours.  Lab Results  Component Value Date   HGBA1C 5.5 08/07/2021    No results for input(s): TSH, T4TOTAL, T3FREE, THYROIDAB in the last 72 hours.  Invalid  input(s): FREET3 ------------------------------------------------------------------------------------------------------------------ ID Labs Recent Labs  Lab 08/07/21 2009 08/07/21 2015 08/08/21 0558  WBC 4.9  --   --   PLT 182  --   --   CRP  --   --  <0.5  DDIMER  --   --  0.39  CREATININE 1.17 1.20 1.06   Cardiac Enzymes No results for input(s): CKMB, TROPONINI, MYOGLOBIN in the last 168 hours.  Invalid input(s): CK   Radiology Reports MR BRAIN WO CONTRAST  Result Date: 08/08/2021 CLINICAL DATA:  Slurred speech, stroke suspected EXAM: MRI HEAD WITHOUT CONTRAST TECHNIQUE: Multiplanar, multiecho pulse sequences of the brain and surrounding structures were obtained without intravenous contrast. COMPARISON:  No prior MRI, correlation is made with 08/07/2021 CT head FINDINGS: Brain: Small areas of mildly increased signal on diffusion-weighted imaging with ADC correlate in the right frontal lobe (series 5, images 86-90) and right occipital lobe (series 5, images 70-71). No acute hemorrhage, mass, mass effect, or midline shift. No hydrocephalus or extra-axial collection. Encephalomalacia in the left parietal and occipital lobes is consistent with remote infarct. Scattered T2 hyperintense signal in the periventricular white matter, likely the sequela of chronic small vessel ischemic disease. Punctate focus of hemosiderin deposition in the left parietal lobe (series 14, image 40), possibly hypertensive microhemorrhage. Vascular: Normal arterial flow voids. Skull and upper cervical spine: Normal marrow signal. Sinuses/Orbits: Mucosal thickening throughout the paranasal sinuses, with sequela of prior endoscopic sinus surgery, with a right nasal polyp. The orbits are unremarkable. Other: The mastoids are well aerated. IMPRESSION: Small acute and/or subacute infarcts in the right frontal lobe and right occipital lobe. Code stroke imaging results were communicated on 08/08/2021 at 12:49 am to provider  BHAGAT via secure text paging. Electronically Signed   By: Merilyn Baba M.D.   On: 08/08/2021 00:50   CT HEAD CODE STROKE WO CONTRAST  Result Date: 08/07/2021 CLINICAL DATA:  Code stroke.  Slurred speech EXAM: CT HEAD WITHOUT CONTRAST TECHNIQUE: Contiguous axial images were obtained from the base of the skull through the vertex without intravenous contrast. RADIATION DOSE REDUCTION: This exam was performed according to the departmental dose-optimization program which includes automated exposure control, adjustment of the mA and/or kV according to patient size and/or use of iterative reconstruction technique. COMPARISON:  None Available. FINDINGS: Brain: No evidence of acute infarction, hemorrhage, cerebral edema, mass, mass effect, or midline shift. Ventricles and  sulci are normal for age. No extra-axial fluid collection. Hypodensity in the left parieto-occipital region, consistent with a remote infarct. Vascular: No hyperdense vessel. Atherosclerotic calcifications in the intracranial carotid and vertebral arteries. No Skull: Negative for fracture or focal lesion. Sinuses/Orbits: Mucosal thickening in the frontal sinuses and remaining ethmoid air cells, pass post partial ethmoidectomy and right maxillary antrostomy. Right sinonasal polyp (series 4, image 15). Other: The mastoid air cells are well aerated. ASPECTS [facility] Stroke Program Early CT Score) - Ganglionic level infarction (caudate, lentiform nuclei, internal capsule, insula, M1-M3 cortex): 7 - Supraganglionic infarction (M4-M6 cortex): 3 Total score (0-10 with 10 being normal): 10 IMPRESSION: 1. No acute intracranial process. 2. ASPECTS is 10 Code stroke imaging results were communicated on 08/07/2021 at 8:23 pm to provider BHAGAT via secure text paging. Electronically Signed   By: Merilyn Baba M.D.   On: 08/07/2021 20:24   CT ANGIO HEAD NECK W WO CM (CODE STROKE)  Result Date: 08/07/2021 CLINICAL DATA:  Slurred speech, stroke suspected EXAM: CT  ANGIOGRAPHY HEAD AND NECK TECHNIQUE: Multidetector CT imaging of the head and neck was performed using the standard protocol during bolus administration of intravenous contrast. Multiplanar CT image reconstructions and MIPs were obtained to evaluate the vascular anatomy. Carotid stenosis measurements (when applicable) are obtained utilizing NASCET criteria, using the distal internal carotid diameter as the denominator. RADIATION DOSE REDUCTION: This exam was performed according to the departmental dose-optimization program which includes automated exposure control, adjustment of the mA and/or kV according to patient size and/or use of iterative reconstruction technique. CONTRAST:  48m OMNIPAQUE IOHEXOL 350 MG/ML SOLN COMPARISON:  No prior CTA, correlation is made with CT head 08/07/2021 FINDINGS: CT HEAD FINDINGS For noncontrast findings, please see same day CT head. CTA NECK FINDINGS Aortic arch: Standard branching. Imaged portion shows no evidence of aneurysm or dissection. No significant stenosis of the major arch vessel origins. Right carotid system: No evidence of dissection, occlusion, or hemodynamically significant stenosis (greater than 50%). Left carotid system: No evidence of dissection, occlusion, or hemodynamically significant stenosis (greater than 50%). Vertebral arteries: No evidence of dissection, occlusion, or hemodynamically significant stenosis (greater than 50%). Skeleton: No acute osseous abnormality. Other neck: No acute finding. Upper chest: No focal pulmonary opacity or pleural effusion. Review of the MIP images confirms the above findings CTA HEAD FINDINGS Anterior circulation: Both internal carotid arteries are patent to the termini, without significant stenosis. A1 segments patent. Normal anterior communicating artery. Anterior cerebral arteries are patent to their distal aspects. No M1 stenosis or occlusion. Normal MCA bifurcations. Distal MCA branches perfused and symmetric. Posterior  circulation: Vertebral arteries patent to the vertebrobasilar junction without stenosis. Posterior inferior cerebellar arteries patent proximally. Basilar patent to its distal aspect. Superior cerebellar arteries patent proximally. Patent P1 segments. Mild irregularity in the left P2 without significant stenosis. PCAs perfused to their distal aspects without stenosis. The bilateral posterior communicating arteries are not visualized. Venous sinuses: As permitted by contrast timing, patent. Anatomic variants: None significant. Review of the MIP images confirms the above findings IMPRESSION: 1.  No intracranial large vessel occlusion or significant stenosis. 2.  No hemodynamically significant stenosis in the neck. Electronically Signed   By: AMerilyn BabaM.D.   On: 08/07/2021 21:18

## 2021-08-08 NOTE — Progress Notes (Signed)
  Echocardiogram 2D Echocardiogram has been performed.  Merrie Roof F 08/08/2021, 1:40 PM

## 2021-08-08 NOTE — Evaluation (Signed)
Occupational Therapy Evaluation Patient Details Name: Terry [name] MRN: [id number] DOB: 03/30/1950 Today's Date: 08/08/2021   History of Present Illness 71 y.o. male presented to the ED as code stroke for evaluation of acute onset slurred speech.  MRI showing small acute and/or subacute infarcts in the right frontal lobe and right occipital lobe. COVID+PMH significant of CAD, paroxysmal Afib no longer on anticoagulation due to history of retroperitoneal bleed in June 2022, cardiomyopathy, history of central retinal venous occlusion, HTN, HLD   Clinical Impression   Pt admitted for concerns listed above. PTA pt reported that he was independent with all ADL's and IADL's, including working out at the gym 6 days a week. At this time, pt presents near his baseline. Vision and cognition WFL, no concerns. He has no further skilled OT needs and acute OT will sign off.       Recommendations for follow up therapy are one component of a multi-disciplinary discharge planning process, led by the attending physician.  Recommendations may be updated based on patient status, additional functional criteria and insurance authorization.   Follow Up Recommendations  No OT follow up    Assistance Recommended at Discharge PRN  Patient can return home with the following      Functional Status Assessment  Patient has had a recent decline in their functional status and demonstrates the ability to make significant improvements in function in a reasonable and predictable amount of time.  Equipment Recommendations  None recommended by OT    Recommendations for Other Services       Precautions / Restrictions Precautions Precautions: None Restrictions Weight Bearing Restrictions: No      Mobility Bed Mobility Overal bed mobility: Independent                  Transfers Overall transfer level: Independent Equipment used: None                      Balance Overall balance assessment:  No apparent balance deficits (not formally assessed)                                         ADL either performed or assessed with clinical judgement   ADL Overall ADL's : At baseline                                       General ADL Comments: Pt indepednent with all BADL's and functional mobility with no dme     Vision Baseline Vision/History: 1 Wears glasses Ability to See in Adequate Light: 0 Adequate Patient Visual Report: No change from baseline Vision Assessment?: No apparent visual deficits     Perception     Praxis      Pertinent Vitals/Pain Pain Assessment Pain Assessment: No/denies pain     Hand Dominance Right   Extremity/Trunk Assessment Upper Extremity Assessment Upper Extremity Assessment: Overall WFL for tasks assessed   Lower Extremity Assessment Lower Extremity Assessment: Defer to PT evaluation   Cervical / Trunk Assessment Cervical / Trunk Assessment: Normal   Communication Communication Communication: No difficulties (slightly slurred (pt aware))   Cognition Arousal/Alertness: Awake/alert Behavior During Therapy: WFL for tasks assessed/performed Overall Cognitive Status: Within Functional Limits for tasks assessed  General Comments  VSS on RA, wife in room    Exercises     Shoulder [place] expects to be discharged to:: Private residence Living Arrangements: Spouse/significant other Available Help at Discharge: Family;Available 24 hours/day Type of Home: House ([facility]) Home Access: Stairs to enter [employer] of Steps: 1 Entrance Stairs-Rails: None Home Layout: Two level;Able to live on main level with bedroom/bathroom     Bathroom Shower/Tub: Teacher, early years/pre: Standard     Home Equipment: None          Prior Functioning/Environment Prior Level of Function :  Independent/Modified Independent             Mobility Comments: works out 6 days/week ADLs Comments: independent        OT Problem List: Decreased strength;Impaired balance (sitting and/or standing)      OT Treatment/Interventions:      OT Goals(Current goals can be found in the care plan section) Acute Rehab OT Goals Patient Stated Goal: To get back to exercising OT Goal Formulation: With patient Time For Goal Achievement: 08/08/21 Potential to Achieve Goals: Good  OT Frequency:      Co-evaluation              AM-PAC OT "6 Clicks" Daily Activity     Outcome Measure Help from another person eating meals?: None Help from another person taking care of personal grooming?: None Help from another person toileting, which includes using toliet, bedpan, or urinal?: None Help from another person bathing (including washing, rinsing, drying)?: None Help from another person to put on and taking off regular upper body clothing?: None Help from another person to put on and taking off regular lower body clothing?: None 6 Click Score: 24   End of Session Nurse Communication: Mobility status  Activity Tolerance: Patient tolerated treatment well Patient left: in bed;with call bell/phone within reach;with family/visitor present  OT Visit Diagnosis: Unsteadiness on feet (R26.81);Other abnormalities of gait and mobility (R26.89);Muscle weakness (generalized) (M62.81)                Time: 6568-1275 OT Time Calculation (min): 34 min Charges:  OT General Charges $OT Visit: 1 Visit OT Evaluation $OT Eval Moderate Complexity: 1 Mod OT Treatments $Self Care/Home Management : 8-22 mins  Cain Fitzhenry H., OTR/L Acute Rehabilitation  Tyree Fluharty Elane Prateek Knipple 08/08/2021, 11:31 AM

## 2021-08-08 NOTE — Evaluation (Signed)
Physical Therapy Evaluation and Discharge Patient Details Name: Terry [name] MRN: [id number] DOB: 1950/05/21 Today's Date: 08/08/2021  History of Present Illness  71 y.o. male presented to the ED as code stroke for evaluation of acute onset slurred speech.  MRI showing small acute and/or subacute infarcts in the right frontal lobe and right occipital lobe. COVID+PMH significant of CAD, paroxysmal Afib no longer on anticoagulation due to history of retroperitoneal bleed in June 2022, cardiomyopathy, history of central retinal venous occlusion, HTN, HLD  Clinical Impression   Patient evaluated by Physical Therapy with no further acute PT needs identified. Patient scored 53/56 on Berg Balance Assessment. All education has been completed and the patient has no further questions.  PT is signing off. Thank you for this referral.        Recommendations for follow up therapy are one component of a multi-disciplinary discharge planning process, led by the attending physician.  Recommendations may be updated based on patient status, additional functional criteria and insurance authorization.  Follow Up Recommendations No PT follow up    Assistance Recommended at Discharge None  Patient can return home with the following       Equipment Recommendations None recommended by PT  Recommendations for Other Services       Functional Status Assessment Patient has not had a recent decline in their functional status     Precautions / Restrictions Precautions Precautions: None      Mobility  Bed Mobility Overal bed mobility: Independent                  Transfers Overall transfer level: Independent Equipment used: None                    Ambulation/Gait Ambulation/Gait assistance: Independent Gait Distance (Feet): 80 Feet Assistive device: None Gait Pattern/deviations: WFL(Within Functional Limits)   Gait velocity interpretation: >2.62 ft/sec, indicative of community  ambulatory   General Gait Details: in room due to COVID; HR and sats stable  Stairs            Wheelchair Mobility    Modified Rankin (Stroke Patients Only)       Balance                                 Standardized Balance Assessment Standardized Balance Assessment : Berg Balance Test Berg Balance Test Sit to Stand: Able to stand without using hands and stabilize independently Standing Unsupported: Able to stand safely 2 minutes Sitting with Back Unsupported but Feet Supported on Floor or Stool: Able to sit safely and securely 2 minutes Stand to Sit: Sits safely with minimal use of hands Transfers: Able to transfer safely, minor use of hands Standing Unsupported with Eyes Closed: Able to stand 10 seconds safely Standing Ubsupported with Feet Together: Able to place feet together independently and stand 1 minute safely From Standing, Reach Forward with Outstretched Arm: Can reach confidently >25 cm (10") From Standing Position, Pick up Object from Floor: Able to pick up shoe safely and easily From Standing Position, Turn to Look Behind Over each Shoulder: Looks behind from both sides and weight shifts well Turn 360 Degrees: Able to turn 360 degrees safely in 4 seconds or less Standing Unsupported, Alternately Place Feet on Step/Stool: Able to stand independently and safely and complete 8 steps in 20 seconds Standing Unsupported, One Foot in Front: Able to place foot tandem independently and hold 30  seconds Standing on One Leg: Tries to lift leg/unable to hold 3 seconds but remains standing independently Total Score: 53         Pertinent Vitals/Pain Pain Assessment Pain Assessment: No/denies pain    Home Living Family/patient expects to be discharged to:: Private residence Living Arrangements: Spouse/significant other Available Help at Discharge: Family;Available 24 hours/day Type of Home: House ([email or web address]) Home Access: Stairs to enter Entrance  Stairs-Rails: None Entrance Stairs-Number of Steps: 1   Home Layout: Two level;Able to live on main level with bedroom/bathroom Home Equipment: None      Prior Function Prior Level of Function : Independent/Modified Independent             Mobility Comments: works out 6 days/week ADLs Comments: independent     Hand Dominance   Dominant Hand: Right    Extremity/Trunk Assessment   Upper Extremity Assessment Upper Extremity Assessment: Overall WFL for tasks assessed (strength/coordination briefly assessed)    Lower Extremity Assessment Lower Extremity Assessment:  (slight weakness left 1st toe extension)    Cervical / Trunk Assessment Cervical / Trunk Assessment: Normal  Communication   Communication: No difficulties (slightly slurred (pt aware))  Cognition Arousal/Alertness: Awake/alert Behavior During Therapy: WFL for tasks assessed/performed Overall Cognitive Status: Within Functional Limits for tasks assessed                                          General Comments      Exercises     Assessment/Plan    PT Assessment Patient does not need any further PT services  PT Problem List         PT Treatment Interventions      PT Goals (Current goals can be found in the Care Plan section)  Acute Rehab PT Goals PT Goal Formulation: All assessment and education complete, DC therapy    Frequency       Co-evaluation               AM-PAC PT "6 Clicks" Mobility  Outcome Measure Help needed turning from your back to your side while in a flat bed without using bedrails?: None Help needed moving from lying on your back to sitting on the side of a flat bed without using bedrails?: None Help needed moving to and from a bed to a chair (including a wheelchair)?: None Help needed standing up from a chair using your arms (e.g., wheelchair or bedside chair)?: None Help needed to walk in hospital room?: None Help needed climbing 3-5 steps with a  railing? : None 6 Click Score: 24    End of Session   Activity Tolerance: Patient tolerated treatment well Patient left: in bed;with call bell/phone within reach;with SCD's reapplied;with family/visitor present;with nursing/sitter in room Nurse Communication: Mobility status PT Visit Diagnosis: Hemiplegia and hemiparesis Hemiplegia - Right/Left: Left Hemiplegia - dominant/non-dominant: Non-dominant Hemiplegia - caused by: Cerebral infarction    Time: 8937-3428 PT Time Calculation (min) (ACUTE ONLY): 23 min   Charges:   PT Evaluation $PT Eval Low Complexity: 1 Low           Arby Barrette, PT Acute Rehabilitation Services  Pager [phone] Office [phone]   Rexanne Mano 08/08/2021, 8:06 AM

## 2021-08-08 NOTE — H&P (Signed)
History and Physical    Alric Geise LSL:[id number] DOB: 05-30-1950 DOA: 08/07/2021  PCP: Lawerance Cruel, MD  Patient coming from: Home  Chief Complaint: Slurred speech  HPI: Zeno Hickel is a 71 y.o. male with medical history significant of CAD, paroxysmal Afib no longer on anticoagulation due to history of spontaneous retroperitoneal bleed in June 2022, cardiomyopathy, history of central retinal venous occlusion, HTN, HLD presented to the ED as code stroke for evaluation of acute onset slurred speech, LKW at 7:15 pm on 08/07/2021.  Patient tested positive for COVID 8 days ago.  tPA not given due to mild symptoms and history of spontaneous retroperitoneal bleed.  CT head negative for acute finding.  CTA head and neck negative for LVO.  Brain MRI showing small acute and/or subacute infarcts in the right frontal lobe and right occipital lobe.  Labs significant for potassium 5.8, bicarb 20.  T. bili 1.9, remainder of LFTs normal.  A1c 5.5.  Repeat labs showing potassium 5.4, bicarb 26.  UA without signs of infection.  UDS negative.  SARS-CoV-2 PCR test positive.  History provided by patient and his wife at bedside.  Yesterday at dinnertime around 7 PM he had acute onset slurred speech with left-sided facial droop and mild left upper extremity weakness.  He was having fevers and diarrhea, diagnosed with COVID 8 days ago but symptoms have now resolved.  Denies cough or shortness of breath.  Reports history of retroperitoneal bleed in June 2022 related to anticoagulation use for A-fib.  Review of Systems:  Review of Systems  All other systems reviewed and are negative.  PMHx: See HPI.   reports that he has never smoked. He has never used smokeless tobacco. He reports that he does not currently use alcohol. He reports that he does not use drugs.  Allergies  Allergen Reactions   Penicillins     Other reaction(s): Unknown    History reviewed. No pertinent family history.  Prior to  Admission medications   Medication Sig Start Date End Date Taking? Authorizing Provider  acetaminophen (TYLENOL) 500 MG tablet Take 1,000 mg by mouth every 4 (four) hours as needed for moderate pain, headache or fever.   Yes [provider]  Cholecalciferol (VITAMIN D3 ULTRA STRENGTH) 125 MCG (5000 UT) capsule Take 5,000 Units by mouth daily.   Yes [provider]  dofetilide (TIKOSYN) 250 MCG capsule Take 250 mcg by mouth 2 (two) times daily. 07/30/21  Yes [provider]  fluticasone (FLONASE) 50 MCG/ACT nasal spray Place 2 sprays into both nostrils at bedtime. 06/19/21  Yes [provider]  guaiFENesin (MUCINEX) 600 MG 12 hr tablet Take 600 mg by mouth 2 (two) times daily as needed for cough.   Yes [provider]  magnesium oxide (MAG-OX) 400 (240 Mg) MG tablet Take 400 mg by mouth at bedtime. 03/16/21  Yes [provider]  metoprolol succinate (TOPROL-XL) 50 MG 24 hr tablet Take 50 mg by mouth at bedtime. 05/30/21  Yes [provider]  rosuvastatin (CRESTOR) 5 MG tablet Take 5 mg by mouth at bedtime. 06/26/21  Yes [provider]  zinc gluconate 50 MG tablet Take 50 mg by mouth daily.   Yes [provider]    Physical Exam: Vitals:   08/08/21 0037 08/08/21 0037 08/08/21 0055 08/08/21 0255  BP: (!) 144/96  (!) 134/100 (!) 135/92  Pulse: (!) 51  (!) 51 (!) 58  Resp: '14  12 15  '$ Temp:  98 F (36.7 C)  98 F (36.7 C)  TempSrc:  Oral  Axillary  SpO2: 97%  99% 95%  Weight:      Height:        Physical Exam Vitals reviewed.  Constitutional:      General: He is not in acute distress. HENT:     Head: Normocephalic and atraumatic.  Eyes:     Extraocular Movements: Extraocular movements intact.  Cardiovascular:     Rate and Rhythm: Normal rate and regular rhythm.     Pulses: Normal pulses.  Pulmonary:     Effort: Pulmonary effort is normal. No respiratory distress.     Breath sounds: Normal breath sounds. No  wheezing or rales.  Abdominal:     General: Bowel sounds are normal. There is no distension.     Palpations: Abdomen is soft.     Tenderness: There is no abdominal tenderness.  Musculoskeletal:        General: No swelling or tenderness.     Cervical back: Normal range of motion.  Skin:    General: Skin is warm and dry.  Neurological:     Mental Status: He is alert and oriented to person, place, and time.     Sensory: No sensory deficit.     Motor: No weakness.     Comments: Speech slightly dysarthric Mild left-sided facial droop     Labs on Admission: I have personally reviewed following labs and imaging studies  CBC: Recent Labs  Lab 08/07/21 2009 08/07/21 2015  WBC 4.9  --   NEUTROABS 2.6  --   HGB 15.3 15.3  HCT 48.2 45.0  MCV 96.8  --   PLT 182  --    Basic Metabolic Panel: Recent Labs  Lab 08/07/21 2009 08/07/21 2015  NA 137 139  K 5.8* 5.4*  CL 108 106  CO2 20*  --   GLUCOSE 80 82  BUN 20 31*  CREATININE 1.17 1.20  CALCIUM 8.8*  --    GFR: Estimated Creatinine Clearance: 64.3 mL/min (by C-G formula based on SCr of 1.2 mg/dL). Liver Function Tests: Recent Labs  Lab 08/07/21 2009  AST 39  ALT 12  ALKPHOS 43  BILITOT 1.9*  PROT 5.8*  ALBUMIN 3.4*   No results for input(s): LIPASE, AMYLASE in the last 168 hours. No results for input(s): AMMONIA in the last 168 hours. Coagulation Profile: Recent Labs  Lab 08/07/21 2009  INR 1.0   Cardiac Enzymes: No results for input(s): CKTOTAL, CKMB, CKMBINDEX, TROPONINI in the last 168 hours. BNP (last 3 results) No results for input(s): PROBNP in the last 8760 hours. HbA1C: Recent Labs    08/07/21 2009  HGBA1C 5.5   CBG: Recent Labs  Lab 08/07/21 2011  GLUCAP 75   Lipid Profile: No results for input(s): CHOL, HDL, LDLCALC, TRIG, CHOLHDL, LDLDIRECT in the last 72 hours. Thyroid Function Tests: No results for input(s): TSH, T4TOTAL, FREET4, T3FREE, THYROIDAB in the last 72 hours. Anemia  Panel: No results for input(s): VITAMINB12, FOLATE, FERRITIN, TIBC, IRON, RETICCTPCT in the last 72 hours. Urine analysis:    Component Value Date/Time   COLORURINE YELLOW 08/07/2021 2200   APPEARANCEUR CLEAR 08/07/2021 2200   LABSPEC >1.046 (H) 08/07/2021 2200   PHURINE 6.0 08/07/2021 2200   GLUCOSEU NEGATIVE 08/07/2021 2200   HGBUR NEGATIVE 08/07/2021 2200   BILIRUBINUR NEGATIVE 08/07/2021 2200   KETONESUR NEGATIVE 08/07/2021 2200   PROTEINUR NEGATIVE 08/07/2021 2200   NITRITE NEGATIVE 08/07/2021 [place] 08/07/2021 2200  Radiological Exams on Admission: I have personally reviewed images MR BRAIN WO CONTRAST  Result Date: 08/08/2021 CLINICAL DATA:  Slurred speech, stroke suspected EXAM: MRI HEAD WITHOUT CONTRAST TECHNIQUE: Multiplanar, multiecho pulse sequences of the brain and surrounding structures were obtained without intravenous contrast. COMPARISON:  No prior MRI, correlation is made with 08/07/2021 CT head FINDINGS: Brain: Small areas of mildly increased signal on diffusion-weighted imaging with ADC correlate in the right frontal lobe (series 5, images 86-90) and right occipital lobe (series 5, images 70-71). No acute hemorrhage, mass, mass effect, or midline shift. No hydrocephalus or extra-axial collection. Encephalomalacia in the left parietal and occipital lobes is consistent with remote infarct. Scattered T2 hyperintense signal in the periventricular white matter, likely the sequela of chronic small vessel ischemic disease. Punctate focus of hemosiderin deposition in the left parietal lobe (series 14, image 40), possibly hypertensive microhemorrhage. Vascular: Normal arterial flow voids. Skull and upper cervical spine: Normal marrow signal. Sinuses/Orbits: Mucosal thickening throughout the paranasal sinuses, with sequela of prior endoscopic sinus surgery, with a right nasal polyp. The orbits are unremarkable. Other: The mastoids are well aerated. IMPRESSION:  Small acute and/or subacute infarcts in the right frontal lobe and right occipital lobe. Code stroke imaging results were communicated on 08/08/2021 at 12:49 am to provider BHAGAT via secure text paging. Electronically Signed   By: Merilyn Baba M.D.   On: 08/08/2021 00:50   CT HEAD CODE STROKE WO CONTRAST  Result Date: 08/07/2021 CLINICAL DATA:  Code stroke.  Slurred speech EXAM: CT HEAD WITHOUT CONTRAST TECHNIQUE: Contiguous axial images were obtained from the base of the skull through the vertex without intravenous contrast. RADIATION DOSE REDUCTION: This exam was performed according to the departmental dose-optimization program which includes automated exposure control, adjustment of the mA and/or kV according to patient size and/or use of iterative reconstruction technique. COMPARISON:  None Available. FINDINGS: Brain: No evidence of acute infarction, hemorrhage, cerebral edema, mass, mass effect, or midline shift. Ventricles and sulci are normal for age. No extra-axial fluid collection. Hypodensity in the left parieto-occipital region, consistent with a remote infarct. Vascular: No hyperdense vessel. Atherosclerotic calcifications in the intracranial carotid and vertebral arteries. No Skull: Negative for fracture or focal lesion. Sinuses/Orbits: Mucosal thickening in the frontal sinuses and remaining ethmoid air cells, pass post partial ethmoidectomy and right maxillary antrostomy. Right sinonasal polyp (series 4, image 15). Other: The mastoid air cells are well aerated. ASPECTS [facility] Stroke Program Early CT Score) - Ganglionic level infarction (caudate, lentiform nuclei, internal capsule, insula, M1-M3 cortex): 7 - Supraganglionic infarction (M4-M6 cortex): 3 Total score (0-10 with 10 being normal): 10 IMPRESSION: 1. No acute intracranial process. 2. ASPECTS is 10 Code stroke imaging results were communicated on 08/07/2021 at 8:23 pm to provider BHAGAT via secure text paging. Electronically Signed   By:  Merilyn Baba M.D.   On: 08/07/2021 20:24   CT ANGIO HEAD NECK W WO CM (CODE STROKE)  Result Date: 08/07/2021 CLINICAL DATA:  Slurred speech, stroke suspected EXAM: CT ANGIOGRAPHY HEAD AND NECK TECHNIQUE: Multidetector CT imaging of the head and neck was performed using the standard protocol during bolus administration of intravenous contrast. Multiplanar CT image reconstructions and MIPs were obtained to evaluate the vascular anatomy. Carotid stenosis measurements (when applicable) are obtained utilizing NASCET criteria, using the distal internal carotid diameter as the denominator. RADIATION DOSE REDUCTION: This exam was performed according to the departmental dose-optimization program which includes automated exposure control, adjustment of the mA and/or kV according to patient  size and/or use of iterative reconstruction technique. CONTRAST:  20m OMNIPAQUE IOHEXOL 350 MG/ML SOLN COMPARISON:  No prior CTA, correlation is made with CT head 08/07/2021 FINDINGS: CT HEAD FINDINGS For noncontrast findings, please see same day CT head. CTA NECK FINDINGS Aortic arch: Standard branching. Imaged portion shows no evidence of aneurysm or dissection. No significant stenosis of the major arch vessel origins. Right carotid system: No evidence of dissection, occlusion, or hemodynamically significant stenosis (greater than 50%). Left carotid system: No evidence of dissection, occlusion, or hemodynamically significant stenosis (greater than 50%). Vertebral arteries: No evidence of dissection, occlusion, or hemodynamically significant stenosis (greater than 50%). Skeleton: No acute osseous abnormality. Other neck: No acute finding. Upper chest: No focal pulmonary opacity or pleural effusion. Review of the MIP images confirms the above findings CTA HEAD FINDINGS Anterior circulation: Both internal carotid arteries are patent to the termini, without significant stenosis. A1 segments patent. Normal anterior communicating artery.  Anterior cerebral arteries are patent to their distal aspects. No M1 stenosis or occlusion. Normal MCA bifurcations. Distal MCA branches perfused and symmetric. Posterior circulation: Vertebral arteries patent to the vertebrobasilar junction without stenosis. Posterior inferior cerebellar arteries patent proximally. Basilar patent to its distal aspect. Superior cerebellar arteries patent proximally. Patent P1 segments. Mild irregularity in the left P2 without significant stenosis. PCAs perfused to their distal aspects without stenosis. The bilateral posterior communicating arteries are not visualized. Venous sinuses: As permitted by contrast timing, patent. Anatomic variants: None significant. Review of the MIP images confirms the above findings IMPRESSION: 1.  No intracranial large vessel occlusion or significant stenosis. 2.  No hemodynamically significant stenosis in the neck. Electronically Signed   By: AMerilyn BabaM.D.   On: 08/07/2021 21:18    EKG: Independently reviewed.  Sinus rhythm, no significant change since prior tracing.  Assessment and Plan  Acute CVA Patient presenting with acute onset slurred speech, symptom onset 7 PM on 08/07/2021. tPA not given due to mild symptoms and history of spontaneous retroperitoneal bleed.  CT head negative for acute finding.  CTA head and neck negative for LVO.  Brain MRI showing small acute and/or subacute infarcts in the right frontal lobe and right occipital lobe. -Neurology consulted, appreciate recommendations -Telemetry monitoring -Permissive hypertension -Echocardiogram -A1c 5.5 -Lipid panel -Neurology recommended aspirin 325 mg and Plavix 300 mg followed by aspirin 81 mg daily and Plavix 75 mg daily for a 21-day course of dual antiplatelet therapy, followed by lifelong aspirin monotherapy. -Adjust outpatient Crestor 5 mg if LDL >70 -Frequent neurochecks -PT, OT, speech therapy. -N.p.o. until cleared by bedside swallow evaluation or formal  speech evaluation   COVID-19 infection Patient reports testing positive for COVID 8 days ago.  He was having fevers and diarrhea at that time but symptoms have now resolved.  Denies any respiratory symptoms at present.  Not hypoxic and lungs clear on exam.  Continues to test positive for COVID. -On isolation precautions at this time, checking inflammatory markers  Mild hyperkalemia No acute EKG changes. -Insulin/dextrose, repeat BMP  Mild elevation of total bilirubin Remainder of LFTs normal. -Repeat LFTs  Paroxysmal A-fib Not on anticoagulation due to history of spontaneous retroperitoneal bleed.  Currently in sinus rhythm. -Continue Tikosyn -Hold metoprolol at this time to allow permissive hypertension.  Hypertension -Allow permissive hypertension at this time.  Hyperlipidemia -Continue Crestor  DVT prophylaxis: SCDs Code Status: Full Code (discussed with the patient) Family Communication: Wife at bedside. Consults called: Neurology Level of care: Telemetry bed Admission status: It is  my clinical opinion that admission to INPATIENT is reasonable and necessary because of the expectation that this patient will require hospital care that crosses at least 2 midnights to treat this condition based on the medical complexity of the problems presented.  Given the aforementioned information, the predictability of an adverse outcome is felt to be significant.   Shela Leff MD [facility]  If 7PM-7AM, please contact night-coverage [email or web address]  08/08/2021, 5:11 AM

## 2021-08-08 NOTE — Progress Notes (Signed)
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 Pt instructed on bedrest and safe mobility techniques

## 2021-08-08 NOTE — Consult Note (Signed)
CARDIOLOGY CONSULT NOTE  Patient ID: Terry [name] MRN: [id number] DOB/AGE: [birth year] 71 y.o.  Admit date: 08/07/2021 Attending physician: Thurnell Lose, MD Primary Physician:  Lawerance Cruel, MD Outpatient Cardiologist: Dr. Adrian Prows Inpatient Cardiologist: Rex Kras, DO, [facility]  Reason of consultation: Atrial fibrillation Referring physician: Thurnell Lose, MD  Chief complaint: Strokelike symptoms  HPI:  Terry [name] is a 71 y.o.  male who presents with a chief complaint of " strokelike symptoms." His past medical history and cardiovascular risk factors include: Paroxysmal atrial fibrillation, hypertension, recovered cardiomyopathy, aortic root dilatation and 4 cm, history of vasovagal syncope, history of right retinal vein occlusion at the age of 45, family history of heart disease, advanced age.  Patient was diagnosed with COVID last week and was progressing well until yesterday around dinnertime his wife noticed expressive aphasia, left-sided facial droop, and weakness in the upper extremities.  They were concerned and called EMS.  Patient was brought in to [facility], ED as stroke code.  He was diagnosed with right MCA small patchy infarcts.  Patient carries a history of paroxysmal atrial fibrillation which is being managed medically.  Rate control with beta-blockers, rhythm control with Tikosyn, and thromboembolic prophylaxis with Eliquis.  However, after experiencing retroperitoneal hemorrhage in June 2022 this shared decision was to discontinue Eliquis.  Now cardiology is consulted for paroxysmal atrial fibrillation management specifically anticoagulation guidance.  Patient is accompanied by his wife, daughter, and brother who all provide collateral history as part of today's encounter.  ALLERGIES: Allergies  Allergen Reactions   Penicillins     Other reaction(s): Unknown    PAST MEDICAL HISTORY: Paroxysmal atrial fibrillation. History of  cardiomyopathy. Central retinal vein occlusion. History of HFrEF. Erectile dysfunction. Hypertension. Hyperlipidemia. Aortic atherosclerosis. Pericarditis. Vasovagal syncope.  PAST SURGICAL HISTORY: Cardioversion. Heart catheterization. Hand surgery. Knee surgery. Pericardiectomy (per EMR)  FAMILY HISTORY: No family history of premature CAD.  Family history of heart disease.   SOCIAL HISTORY:  The patient  reports that he has never smoked. He has never used smokeless tobacco. He reports that he does not currently use alcohol. He reports that he does not use drugs.  MEDICATIONS: Current Outpatient Medications  Medication Instructions   acetaminophen (TYLENOL) 1,000 mg, Oral, Every 4 hours PRN   dofetilide (TIKOSYN) 250 mcg, Oral, 2 times daily   fluticasone (FLONASE) 50 MCG/ACT nasal spray 2 sprays, Each Nare, Daily at bedtime   guaiFENesin (MUCINEX) 600 mg, Oral, 2 times daily PRN   magnesium oxide (MAG-OX) 400 mg, Oral, Daily at bedtime   metoprolol succinate (TOPROL-XL) 50 mg, Oral, Daily at bedtime   rosuvastatin (CRESTOR) 5 mg, Oral, Daily at bedtime   Vitamin D3 Ultra Strength 5,000 Units, Oral, Daily   zinc gluconate 50 mg, Oral, Daily    REVIEW OF SYSTEMS: Review of Systems  Cardiovascular:  Negative for chest pain, dyspnea on exertion, leg swelling, near-syncope, orthopnea, palpitations, paroxysmal nocturnal dyspnea and syncope.  Respiratory:  Positive for snoring.   Neurological:  Positive for focal weakness (Present on admission, now improved).       Expressive aphasia (present on admission, now improved)   PHYSICAL EXAM:    08/08/2021    1:53 PM 08/08/2021   11:42 AM 08/08/2021    6:56 AM  Vitals with BMI  Systolic 86 128 786  Diastolic 52 96 767  Pulse 50 57 58     Intake/Output Summary (Last 24 hours) at 08/08/2021 1838 Last data filed at 08/08/2021 1600 Gross per 24  hour  Intake 466.67 ml  Output 700 ml  Net -233.33 ml    Net IO Since  Admission: -233.33 mL [08/08/21 1838]  CONSTITUTIONAL: Well-developed and well-nourished. No acute distress.  SKIN: Skin is warm and dry. No rash noted. No cyanosis. No pallor. No jaundice HEAD: Normocephalic and atraumatic.  EYES: No scleral icterus MOUTH/THROAT: Moist oral membranes.  NECK: No JVD present. No thyromegaly noted. No carotid bruits  CHEST Normal respiratory effort. No intercostal retractions  LUNGS:  Clear to auscultation bilaterally.  No stridor. No wheezes. No rales.  CARDIOVASCULAR: Regular rate and rhythm, positive S1-S2, no murmurs rubs or gallops appreciated ABDOMINAL: Soft, nontender, nondistended, positive bowel sounds in all 4 quadrants, no apparent ascites.  EXTREMITIES: No pitting edema, warm to touch.  HEMATOLOGIC: No significant bruising NEUROLOGIC: Oriented to person, place, and time. Nonfocal. Normal muscle tone.  PSYCHIATRIC: Normal mood and affect. Normal behavior. Cooperative  RADIOLOGY: MR BRAIN WO CONTRAST  Result Date: 08/08/2021 CLINICAL DATA:  Slurred speech, stroke suspected EXAM: MRI HEAD WITHOUT CONTRAST TECHNIQUE: Multiplanar, multiecho pulse sequences of the brain and surrounding structures were obtained without intravenous contrast. COMPARISON:  No prior MRI, correlation is made with 08/07/2021 CT head FINDINGS: Brain: Small areas of mildly increased signal on diffusion-weighted imaging with ADC correlate in the right frontal lobe (series 5, images 86-90) and right occipital lobe (series 5, images 70-71). No acute hemorrhage, mass, mass effect, or midline shift. No hydrocephalus or extra-axial collection. Encephalomalacia in the left parietal and occipital lobes is consistent with remote infarct. Scattered T2 hyperintense signal in the periventricular white matter, likely the sequela of chronic small vessel ischemic disease. Punctate focus of hemosiderin deposition in the left parietal lobe (series 14, image 40), possibly hypertensive microhemorrhage.  Vascular: Normal arterial flow voids. Skull and upper cervical spine: Normal marrow signal. Sinuses/Orbits: Mucosal thickening throughout the paranasal sinuses, with sequela of prior endoscopic sinus surgery, with a right nasal polyp. The orbits are unremarkable. Other: The mastoids are well aerated. IMPRESSION: Small acute and/or subacute infarcts in the right frontal lobe and right occipital lobe. Code stroke imaging results were communicated on 08/08/2021 at 12:49 am to provider BHAGAT via secure text paging. Electronically Signed   By: Merilyn Baba M.D.   On: 08/08/2021 00:50   ECHOCARDIOGRAM COMPLETE  Result Date: 08/08/2021    ECHOCARDIOGRAM REPORT   Patient Name:   Terry [name] Date of Exam: 08/08/2021 Medical Rec #:  [id number]       Height:       72.0 in Accession #:    [id number]      Weight:       170.0 lb Date of Birth:  12-Mar-1951       BSA:          1.988 m Patient Age:    45 years        BP:           86/53 mmHg Patient Gender: M               HR:           55 bpm. Exam Location:  Inpatient Procedure: 2D Echo, Cardiac Doppler, Color Doppler and Intracardiac            Opacification Agent Indications:     Stroke. TIA  History:         Patient has prior history of Echocardiogram examinations, most  recent 07/08/2020. Arrythmias:Bradycardia;                  Signs/Symptoms:Hypotension. Vasovagal syndrone. Covid 19                  positive.  Sonographer:     Merrie Roof RDCS Referring Phys:  [id number] [facility] Diagnosing Phys: Rex Kras DO IMPRESSIONS  1. Left ventricular ejection fraction, by estimation, is 60 to 65%. The left ventricle has normal function. The left ventricle has no regional wall motion abnormalities. Left ventricular diastolic parameters were normal.  2. Right ventricular systolic function is normal. The right ventricular size is normal. There is normal pulmonary artery systolic pressure.  3. Left atrial size was moderately dilated.  4. The mitral valve is  grossly normal. Mild mitral valve regurgitation. No evidence of mitral stenosis.  5. The aortic valve is tricuspid. Aortic valve regurgitation is not visualized. No aortic stenosis is present.  6. Aortic proximal ascending aorta within normal limits. There is borderline dilatation of the aortic root, measuring 38 mm. Comparison(s): No significant change when compared to prior echo dated 10/20/2020. Conclusion(s)/Recommendation(s): No intracardiac source of embolism detected on this transthoracic study. Consider a transesophageal echocardiogram to exclude cardiac source of embolism if clinically indicated. No left ventricular mural or apical thrombus/thrombi. FINDINGS  Left Ventricle: Left ventricular ejection fraction, by estimation, is 60 to 65%. The left ventricle has normal function. The left ventricle has no regional wall motion abnormalities. Definity contrast agent was given IV to delineate the left ventricular  endocardial borders. The left ventricular internal cavity size was normal in size. There is no left ventricular hypertrophy. Left ventricular diastolic parameters were normal. Right Ventricle: The right ventricular size is normal. No increase in right ventricular wall thickness. Right ventricular systolic function is normal. There is normal pulmonary artery systolic pressure. Left Atrium: Left atrial size was moderately dilated. Right Atrium: Right atrial size was normal in size. Pericardium: There is no evidence of pericardial effusion. Mitral Valve: The mitral valve is grossly normal. Mild mitral valve regurgitation, with eccentric medially directed jet. No evidence of mitral valve stenosis. Tricuspid Valve: The tricuspid valve is normal in structure. Tricuspid valve regurgitation is not demonstrated. No evidence of tricuspid stenosis. Aortic Valve: The aortic valve is tricuspid. Aortic valve regurgitation is not visualized. No aortic stenosis is present. Aortic valve mean gradient measures 3.0 mmHg.  Aortic valve peak gradient measures 5.4 mmHg. Aortic valve area, by VTI measures 2.93 cm. Pulmonic Valve: The pulmonic valve was grossly normal. Pulmonic valve regurgitation is not visualized. No evidence of pulmonic stenosis. Aorta: Proximal ascending aorta within normal limits. There is borderline dilatation of the aortic root, measuring 38 mm. IAS/Shunts: The interatrial septum appears to be lipomatous. The atrial septum is grossly normal.  LEFT VENTRICLE PLAX 2D LVIDd:         4.90 cm      Diastology LVIDs:         3.60 cm      LV e' medial:    7.93 cm/s LV PW:         0.90 cm      LV E/e' medial:  9.6 LV IVS:        0.80 cm      LV e' lateral:   13.10 cm/s LVOT diam:     2.20 cm      LV E/e' lateral: 5.8 LV SV:         75 LV SV Index:  38 LVOT Area:     3.80 cm  LV Volumes (MOD) LV vol d, MOD A2C: 114.0 ml LV vol d, MOD A4C: 132.0 ml LV vol s, MOD A2C: 45.7 ml LV vol s, MOD A4C: 32.6 ml LV SV MOD A2C:     68.3 ml LV SV MOD A4C:     132.0 ml LV SV MOD BP:      83.2 ml RIGHT VENTRICLE RV Basal diam:  3.40 cm RV S prime:     12.00 cm/s TAPSE (M-mode): 2.2 cm LEFT ATRIUM            Index        RIGHT ATRIUM           Index LA diam:      4.50 cm  2.26 cm/m   RA Area:     17.00 cm LA Vol (A2C): 108.0 ml 54.32 ml/m  RA Volume:   38.00 ml  19.11 ml/m  AORTIC VALVE AV Area (Vmax):    3.41 cm AV Area (Vmean):   3.34 cm AV Area (VTI):     2.93 cm AV Vmax:           116.00 cm/s AV Vmean:          74.400 cm/s AV VTI:            0.256 m AV Peak Grad:      5.4 mmHg AV Mean Grad:      3.0 mmHg LVOT Vmax:         104.00 cm/s LVOT Vmean:        65.300 cm/s LVOT VTI:          0.197 m LVOT/AV VTI ratio: 0.77  AORTA Ao Root diam: 3.80 cm Ao Asc diam:  3.60 cm MITRAL VALVE MV Area (PHT): 2.83 cm    SHUNTS MV Decel Time: 268 msec    Systemic VTI:  0.20 m MV E velocity: 76.40 cm/s  Systemic Diam: 2.20 cm MV A velocity: 87.00 cm/s MV E/A ratio:  0.88 Ahonesty Woodfin DO Electronically signed by Rex Kras DO Signature Date/Time:  08/08/2021/3:16:43 PM    Final    CT HEAD CODE STROKE WO CONTRAST  Result Date: 08/07/2021 CLINICAL DATA:  Code stroke.  Slurred speech EXAM: CT HEAD WITHOUT CONTRAST TECHNIQUE: Contiguous axial images were obtained from the base of the skull through the vertex without intravenous contrast. RADIATION DOSE REDUCTION: This exam was performed according to the departmental dose-optimization program which includes automated exposure control, adjustment of the mA and/or kV according to patient size and/or use of iterative reconstruction technique. COMPARISON:  None Available. FINDINGS: Brain: No evidence of acute infarction, hemorrhage, cerebral edema, mass, mass effect, or midline shift. Ventricles and sulci are normal for age. No extra-axial fluid collection. Hypodensity in the left parieto-occipital region, consistent with a remote infarct. Vascular: No hyperdense vessel. Atherosclerotic calcifications in the intracranial carotid and vertebral arteries. No Skull: Negative for fracture or focal lesion. Sinuses/Orbits: Mucosal thickening in the frontal sinuses and remaining ethmoid air cells, pass post partial ethmoidectomy and right maxillary antrostomy. Right sinonasal polyp (series 4, image 15). Other: The mastoid air cells are well aerated. ASPECTS [facility] Stroke Program Early CT Score) - Ganglionic level infarction (caudate, lentiform nuclei, internal capsule, insula, M1-M3 cortex): 7 - Supraganglionic infarction (M4-M6 cortex): 3 Total score (0-10 with 10 being normal): 10 IMPRESSION: 1. No acute intracranial process. 2. ASPECTS is 10 Code stroke imaging results were communicated on 08/07/2021 at 8:23  pm to provider BHAGAT via secure text paging. Electronically Signed   By: Merilyn Baba M.D.   On: 08/07/2021 20:24   CT ANGIO HEAD NECK W WO CM (CODE STROKE)  Result Date: 08/07/2021 CLINICAL DATA:  Slurred speech, stroke suspected EXAM: CT ANGIOGRAPHY HEAD AND NECK TECHNIQUE: Multidetector CT imaging of the  head and neck was performed using the standard protocol during bolus administration of intravenous contrast. Multiplanar CT image reconstructions and MIPs were obtained to evaluate the vascular anatomy. Carotid stenosis measurements (when applicable) are obtained utilizing NASCET criteria, using the distal internal carotid diameter as the denominator. RADIATION DOSE REDUCTION: This exam was performed according to the departmental dose-optimization program which includes automated exposure control, adjustment of the mA and/or kV according to patient size and/or use of iterative reconstruction technique. CONTRAST:  18m OMNIPAQUE IOHEXOL 350 MG/ML SOLN COMPARISON:  No prior CTA, correlation is made with CT head 08/07/2021 FINDINGS: CT HEAD FINDINGS For noncontrast findings, please see same day CT head. CTA NECK FINDINGS Aortic arch: Standard branching. Imaged portion shows no evidence of aneurysm or dissection. No significant stenosis of the major arch vessel origins. Right carotid system: No evidence of dissection, occlusion, or hemodynamically significant stenosis (greater than 50%). Left carotid system: No evidence of dissection, occlusion, or hemodynamically significant stenosis (greater than 50%). Vertebral arteries: No evidence of dissection, occlusion, or hemodynamically significant stenosis (greater than 50%). Skeleton: No acute osseous abnormality. Other neck: No acute finding. Upper chest: No focal pulmonary opacity or pleural effusion. Review of the MIP images confirms the above findings CTA HEAD FINDINGS Anterior circulation: Both internal carotid arteries are patent to the termini, without significant stenosis. A1 segments patent. Normal anterior communicating artery. Anterior cerebral arteries are patent to their distal aspects. No M1 stenosis or occlusion. Normal MCA bifurcations. Distal MCA branches perfused and symmetric. Posterior circulation: Vertebral arteries patent to the vertebrobasilar junction  without stenosis. Posterior inferior cerebellar arteries patent proximally. Basilar patent to its distal aspect. Superior cerebellar arteries patent proximally. Patent P1 segments. Mild irregularity in the left P2 without significant stenosis. PCAs perfused to their distal aspects without stenosis. The bilateral posterior communicating arteries are not visualized. Venous sinuses: As permitted by contrast timing, patent. Anatomic variants: None significant. Review of the MIP images confirms the above findings IMPRESSION: 1.  No intracranial large vessel occlusion or significant stenosis. 2.  No hemodynamically significant stenosis in the neck. Electronically Signed   By: AMerilyn BabaM.D.   On: 08/07/2021 21:18    LABORATORY DATA: Lab Results  Component Value Date   WBC 4.9 08/07/2021   HGB 15.3 08/07/2021   HCT 45.0 08/07/2021   MCV 96.8 08/07/2021   PLT 182 08/07/2021    Recent Labs  Lab 08/08/21 0558 08/08/21 1446  NA 139  --   K 3.5 3.7  CL 107  --   CO2 25  --   BUN 16  --   CREATININE 1.06  --   CALCIUM 8.6*  --   PROT 5.5*  --   BILITOT 0.6  --   ALKPHOS 39  --   ALT 17  --   AST 16  --   GLUCOSE 169*  --     Lipid Panel  Lab Results  Component Value Date   CHOL 144 08/08/2021   HDL 33 (L) 08/08/2021   LDLCALC 87 08/08/2021   TRIG 121 08/08/2021   CHOLHDL 4.4 08/08/2021    BNP (last 3 results) No results for input(s): BNP  in the last 8760 hours.  HEMOGLOBIN A1C Lab Results  Component Value Date   HGBA1C 5.5 08/07/2021   MPG 111.15 08/07/2021    Cardiac Panel (last 3 results) No results for input(s): CKTOTAL, CKMB, TROPONINIHS, RELINDX in the last 72 hours.   TSH No results for input(s): TSH in the last 8760 hours.   RADIOLOGY DATABASE: CT Angio Chest/Abd/Pelvis 08/19/2020: 1. Large right-sided perinephric hematoma with evidence of active extravasation from the midpole lower pole junction region laterally. The hematoma measures approximately 11.5 x  10.0 x 9.0 cm with mass effect on the right kidney. No obvious underlying mass is identified. Recommend interventional radiology consultation. 2. No thoracic or abdominal aortic aneurysm or dissection. 3. Irregular and beaded appearance of the right renal arteries could not exclude FMD. There are 2 right and 2 left renal arteries. 4. No acute pulmonary findings or worrisome pulmonary lesions. 5. Aortic atherosclerosis.   ADDENDUM REPORT: 10/17/2020 15:15   ADDENDUM: I reviewed this study for Dr. Adrian Prows. Concern for aortic root dilatation. There is moderate artifact through the region of the aortic root. The maximum diameter at the sinuses of Valsalva is 41.5 mm and the diameter at the sinotubular junction is 2.9 cm.   Electronically Signed   By: Marijo Sanes M.D.   On: 10/17/2020 15:15  CARDIAC DATABASE: EKG: 08/07/2021: Normal sinus rhythm, 75 bpm, without underlying ischemia injury pattern.  Echocardiogram: 08/08/2021: LVEF 33-82%, normal diastolic function, normal right ventricular size and function, moderately dilated left atrium, mild MR, aortic root 38 mm.  Coronary CTA 11/10/2017: 1. Calcium score 5 isolated area in mid LAD this is 26 th percentile for age and sex.  2.  Normal coronary arteries.  3.  Mild aortic root dilatation 4.0 cm.  IMPRESSION & RECOMMENDATIONS: Terry [name] is a 71 y.o.  male whose past medical history and cardiovascular risk factors include: Paroxysmal atrial fibrillation, hypertension, recovered cardiomyopathy, aortic root dilatation and 4 cm, history of vasovagal syncope, history of right retinal vein occlusion at the age of 66, family history of heart disease, advanced age.  Impression:  Paroxysmal atrial fibrillation. Long-term oral anticoagulation Long-term antiarrhythmic medication. 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 Recovered cardiomyopathy. Aortic  atherosclerosis. History of vasovagal syncope.   Plan:  Paroxysmal atrial fibrillation: Rate control: Metoprolol. Rhythm control: Tikosyn. Thromboembolic prophylaxis: Xarelto. CHA2DS2-VASc SCORE is 5 which correlates to 6.7% risk of stroke per year (HTN, age, stroke, aortic atherosclerosis). In the past patient was on Eliquis which was discontinued in June 2022 secondary to spontaneous retroperitoneal hemorrhage. However, now presents with acute/subacute right MCA stroke.  Cardiology has been asked to mediate anticoagulation recommendations given his history and now CVA. Had a long discussion with patient, wife, and daughter at bedside.  All in agreement with reinitiation of anticoagulation for stroke prophylaxis.  Patient is educated on the importance of being cognizant for signs of bleeding and to seek medical attention if and when present.  Education the importance of fall prevention. Risks, benefits, and alternatives to anticoagulation reemphasized with the patient and family at bedside.  Given his history of retroperitoneal bleed he would like to decrease the duration of anticoagulation if possible.  We discussed left atrial appendage occlusion device (i.e. watchman) in detail.  However, in the acute setting would recommend anticoagulation and will refer him to structural heart team as outpatient.  Patient and family are in agreement.  I also spoke to neurologist Dr. Erlinda Hong with regards to antiplatelet recommendations.  He is currently on aspirin 81 mg p.o. daily as well as Plavix 75 mg p.o. daily. Dr. Erlinda Hong recommends discontinuation of antiplatelet therapy since patient is agreeable with anticoagulation. Patient is cleared by neurology to start anticoagulation.  Xarelto dosing per pharmacy.  Given his prior experience with Eliquis he would like to consider Xarelto as an alternative.  Medication profile discussed.  Long-term oral anticoagulation: See above. His current hemoglobin 15.3  g/dL.  Long-term antiarrhythmic medications: Continue home dose of Tikosyn.  Right MCA stroke: Did not receive tPA as he was outside the window.  Focal deficits slowly improving.  Educated the importance of secondary prevention.  LDL 87 mg/dL, A1c 5.5.  COVID-19 infection: Management per primary.   Reviewed the progress notes from other providers since admission, independently reviewed echocardiogram, results of the CT and MRI reviewed and noted above, had a long discussion with the patient/daughter/wife with regards to anticoagulation management for thromboembolic prophylaxis given his history of retroperitoneal bleed and acute/subacute right MCA stroke.  Plan of care discussed with neurology and primary team.  Patient's questions and concerns were addressed to his satisfaction. He voices understanding of the instructions provided during this encounter.   This note was created using a voice recognition software as a result there may be grammatical errors inadvertently enclosed that do not reflect the nature of this encounter. Every attempt is made to correct such errors.  Mechele Claude [facility]  Pager: [phone] Office: [phone] 08/08/2021, 6:38 PM

## 2021-08-09 DIAGNOSIS — I63411 Cerebral infarction due to embolism of right middle cerebral artery: Secondary | ICD-10-CM | POA: Diagnosis not present

## 2021-08-09 LAB — CBC WITH DIFFERENTIAL/PLATELET
Abs Immature Granulocytes: 0.01 10*3/uL (ref 0.00–0.07)
Basophils Absolute: 0 10*3/uL (ref 0.0–0.1)
Basophils Relative: 1 %
Eosinophils Absolute: 0.1 10*3/uL (ref 0.0–0.5)
Eosinophils Relative: 3 %
HCT: 42.8 % (ref 39.0–52.0)
Hemoglobin: 14.2 g/dL (ref 13.0–17.0)
Immature Granulocytes: 0 %
Lymphocytes Relative: 23 %
Lymphs Abs: 1.1 10*3/uL (ref 0.7–4.0)
MCH: 29.8 pg (ref 26.0–34.0)
MCHC: 33.2 g/dL (ref 30.0–36.0)
MCV: 89.7 fL (ref 80.0–100.0)
Monocytes Absolute: 0.5 10*3/uL (ref 0.1–1.0)
Monocytes Relative: 9 %
Neutro Abs: 3.2 10*3/uL (ref 1.7–7.7)
Neutrophils Relative %: 64 %
Platelets: 186 10*3/uL (ref 150–400)
RBC: 4.77 MIL/uL (ref 4.22–5.81)
RDW: 13 % (ref 11.5–15.5)
WBC: 4.9 10*3/uL (ref 4.0–10.5)
nRBC: 0 % (ref 0.0–0.2)

## 2021-08-09 LAB — COMPREHENSIVE METABOLIC PANEL
ALT: 16 U/L (ref 0–44)
AST: 15 U/L (ref 15–41)
Albumin: 2.9 g/dL — ABNORMAL LOW (ref 3.5–5.0)
Alkaline Phosphatase: 41 U/L (ref 38–126)
Anion gap: 8 (ref 5–15)
BUN: 10 mg/dL (ref 8–23)
CO2: 26 mmol/L (ref 22–32)
Calcium: 8.5 mg/dL — ABNORMAL LOW (ref 8.9–10.3)
Chloride: 108 mmol/L (ref 98–111)
Creatinine, Ser: 1.08 mg/dL (ref 0.61–1.24)
GFR, Estimated: >60 mL/min (ref >60)
Glucose, Bld: 102 mg/dL — ABNORMAL HIGH (ref 70–99)
Potassium: 3.5 mmol/L (ref 3.5–5.1)
Sodium: 142 mmol/L (ref 135–145)
Total Bilirubin: 0.7 mg/dL (ref 0.3–1.2)
Total Protein: 5.3 g/dL — ABNORMAL LOW (ref 6.5–8.1)

## 2021-08-09 LAB — MAGNESIUM: Magnesium: 1.7 mg/dL (ref 1.7–2.4)

## 2021-08-09 LAB — C-REACTIVE PROTEIN: CRP: 0.5 mg/dL (ref <1.0)

## 2021-08-09 MED ORDER — METOPROLOL SUCCINATE ER 50 MG PO TB24: 50.0000 mg | ORAL_TABLET | Freq: Every day | ORAL | Status: DC

## 2021-08-09 MED ORDER — POTASSIUM CHLORIDE CRYS ER 20 MEQ PO TBCR
40.0000 meq | EXTENDED_RELEASE_TABLET | Freq: Once | ORAL | Status: AC
  Administered 2021-08-09: 40 meq via ORAL
  Filled 2021-08-09: qty 2

## 2021-08-09 MED ORDER — RIVAROXABAN 20 MG PO TABS: 20.0000 mg | ORAL_TABLET | Freq: Every day | ORAL | 0 refills | Status: DC

## 2021-08-09 MED ORDER — ROSUVASTATIN CALCIUM 10 MG PO TABS: 10.0000 mg | ORAL_TABLET | Freq: Every day | ORAL | 0 refills | Status: DC

## 2021-08-09 NOTE — Evaluation (Signed)
Speech Language Pathology Evaluation Patient Details Name: Terry [name] MRN: [id number] DOB: 1950/10/07 Today's Date: 08/09/2021 Time: 1607-3710 SLP Time Calculation (min) (ACUTE ONLY): 44 min  Problem List:  Patient Active Problem List   Diagnosis Date Noted   Stroke [facility]) 08/07/2021   Past Medical History: No past medical history on file.  HPI:  71 y.o. male presented to the ED as code stroke for evaluation of acute onset slurred speech.  MRI showing small acute and/or subacute infarcts in the right frontal lobe and right occipital lobe. COVID+PMH significant of CAD, paroxysmal Afib no longer on anticoagulation due to history of retroperitoneal bleed in June 2022, cardiomyopathy, history of central retinal venous occlusion, HTN, HLD   Assessment / Plan / Recommendation Clinical Impression  Pt presents with baseline mild deficits in divergent naming tasks and acute changes in speech with this admission.  Speech is fully intelligible and marked by subtle distortions and imprecisions in consonant production.  Terry [name] endorses significant improvement in the last 24 hours and feels that he is at 95% of baseline function.  Given improvements in dysarthria, I would anticipate continued trajectory of recovery.  Terry [name] prefers to defer OP SLP follow-up.  No further needs identified.  Reviewed BE FAST acronym - pt and hs wife verbalize understanding. Our service will sign off.    SLP Assessment  SLP Recommendation/Assessment: Patient does not need any further Speech [place] [facility] SLP Visit Diagnosis: Dysarthria and anarthria (R47.1)    Recommendations for follow up therapy are one component of a multi-disciplinary discharge planning process, led by the attending physician.  Recommendations may be updated based on patient status, additional functional criteria and insurance authorization.    Follow Up Recommendations  No SLP follow up    Assistance Recommended at  Discharge   none                 SLP Evaluation Cognition  Overall Cognitive Status: Within Functional Limits for tasks assessed Orientation Level: Oriented X4       Comprehension  Auditory Comprehension Overall Auditory Comprehension: Appears within functional limits for tasks assessed Yes/No Questions: Within Functional Limits Commands: Within Functional Limits Conversation: Complex Visual Recognition/Discrimination Discrimination: Within Function Limits Reading Comprehension Reading Status: Not tested    Expression Expression Primary Mode of Expression: Verbal Verbal Expression Overall Verbal Expression: Appears within functional limits for tasks assessed Initiation: No impairment Level of Generative/Spontaneous Verbalization: Conversation Repetition: No impairment Naming: Impairment Divergent: 75-100% accurate Pragmatics: No impairment Written Expression Dominant Hand: Right   Oral / Motor  Oral Motor/Sensory Function Overall Oral Motor/Sensory Function: Within functional limits Motor Speech Overall Motor Speech: Impaired Respiration: Within functional limits Phonation: Normal Resonance: Within functional limits Articulation: Impaired 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 Terry [name], Terry [name] Office number [phone] Pager [phone]

## 2021-08-09 NOTE — TOC Transition Note (Signed)
Transition of Care [facility]) - CM/SW Discharge Note   Patient Details  Name: Terry [name] MRN: [id number] Date of Birth: 04/12/1950  Transition of Care [facility]) CM/SW Contact:  Carles Collet, RN Phone Number: 08/09/2021, 9:07 AM   Clinical Narrative:    Spoke to patient over the phone.  Discussed plans for home health services, set up with [facility] for SLP. Discussed [employer] coupon for 30 days free.  Coupon given to nurse (+Covid) who will hand to patient with DC instructions No other TOC Needs    Final next level of care: Home w Home Health Services Barriers to Discharge: No Barriers Identified   Patient Goals and CMS Choice Patient states their goals for this hospitalization and ongoing recovery are:: to go home CMS Medicare.gov Compare Post Acute Care list provided to:: Patient Choice offered to / list presented to : Patient  Discharge Placement                       Discharge Plan and Services                          HH Arranged: Speech Therapy [place] Agency: [place] Date [facility] Agency Contacted: 08/09/21 Time [place]: 0907 Representative spoke with at [place]: Amy  Social Determinants of Health ([place]) Interventions     Readmission Risk Interventions     View : No data to display.

## 2021-08-09 NOTE — Discharge Instructions (Signed)
Follow with Primary MD Lawerance Cruel, MD and Dr. Einar Gip in 7 days   Get CBC, CMP, 2 view Chest X ray -  checked next visit within 1 week by Primary MD    Activity: As tolerated with Full fall precautions use walker/cane & assistance as needed  Disposition Home     Diet: Heart Healthy   Special Instructions: If you have smoked or chewed Tobacco  in the last 2 yrs please stop smoking, stop any regular Alcohol  and or any Recreational drug use.  On your next visit with your primary care physician please Get Medicines reviewed and adjusted.  Please request your Prim.MD to go over all Hospital Tests and Procedure/Radiological results at the follow up, please get all Hospital records sent to your Prim MD by signing hospital release before you go home.  If you experience worsening of your admission symptoms, develop shortness of breath, life threatening emergency, suicidal or homicidal thoughts you must seek medical attention immediately by calling 911 or calling your MD immediately  if symptoms less severe.  You Must read complete instructions/literature along with all the possible adverse reactions/side effects for all the Medicines you take and that have been prescribed to you. Take any new Medicines after you have completely understood and accpet all the possible adverse reactions/side effects.

## 2021-08-09 NOTE — Discharge Summary (Signed)
Terry [name] HWE:[id number] DOB: December 28, 1950 DOA: 08/07/2021  PCP: Lawerance Cruel, MD  Admit date: 08/07/2021  Discharge date: 08/09/2021  Admitted From: Home   Disposition:  Home   Recommendations for Outpatient Follow-up:   Follow up with PCP in 1-2 weeks  PCP Please obtain BMP/CBC, 2 view CXR in 1week,  (see Discharge instructions)   PCP Please follow up on the following pending results: Needs close outpatient neurology and cardiology follow-up.   Home Health: SLP if qualifies   Equipment/Devices: None  Consultations: Neuro, Cards Discharge Condition: Stable    CODE STATUS: Full    Diet Recommendation: Heart Healthy     Chief Complaint  Patient presents with   Code Stroke     Brief history of present illness from the day of admission and additional interim summary    71 y.o. male with medical history significant of CAD, paroxysmal Afib no longer on anticoagulation due to history of spontaneous retroperitoneal bleed in June 2022, cardiomyopathy, history of central retinal venous occlusion, HTN, HLD presented to the ED as code stroke for evaluation of acute onset slurred speech, LKW at 7:15 pm on 08/07/2021.  Patient also had COVID-19 related illness about a week prior to hospital visit, he is fully vaccinated.  In the ER he was diagnosed with CVA causing dysarthria and admitted to the hospital.                                                                 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 He has history of A-fib and most likely his strokes are embolic due to cardiac source, full stroke pathway being followed, neurology and cardiology to evaluate.  Of note he was on Eliquis before but it was  stopped last year due to spontaneous retroperitoneal bleed.  Clearly his [place] vas 2 score is much higher now.  Risks and benefits of anticoagulation explained to the family, pharmacy, cardiology and neurology also had detailed discussions with patient and family.  CTA head and neck stable, A1c stable, echocardiogram stable and nonacute, LDL slightly above goal hence Crestor dose increased.  Now placed on Xarelto after discussions between patient, wife, cardiology and neurology team.  He will follow-up with Dr. Einar Gip and neurology outpatient post discharge and will be considered for a Watchman device.  Him times much improved.  Will try to order home speech therapy if he qualifies.   2.  COVID-19 infection.  1 week before hospital visit.  Did have febrile illness and cough now resolved, chest x-ray stable, inflammatory markers are unremarkable.  Monitor.   3.  Hyperkalemia.  Resolved will monitor.   4.  Paroxysmal A-fib with [place] vas 2  score of greater than for now.  Embolic stroke as above, continue Tikosyn.  Anticoagulation as a #1 above   5.  Dyslipidemia.  DL above goal increased Crestor dose.   6.  Hypertension.  Allow for permissive hypertension due to CVA and monitor, continue home dose beta-blocker from tomorrow.   Discharge diagnosis     Principal Problem:   Stroke [facility])    Discharge instructions    Discharge Instructions     Ambulatory referral to Neurology   Complete by: As directed    Follow up with stroke clinic NP (Jessica Vanschaick or Cecille Rubin, if both not available, consider Zachery Dauer, or Ahern) at [facility] in about 4 weeks. Thanks.   Diet - low sodium heart healthy   Complete by: As directed    Discharge instructions   Complete by: As directed    Follow with Primary MD Lawerance Cruel, MD and Dr. Einar Gip in 7 days   Get CBC, CMP, 2 view Chest X ray -  checked next visit within 1 week by Primary MD    Activity: As tolerated with Full fall precautions use  walker/cane & assistance as needed  Disposition Home     Diet: Heart Healthy   Special Instructions: If you have smoked or chewed Tobacco  in the last 2 yrs please stop smoking, stop any regular Alcohol  and or any Recreational drug use.  On your next visit with your primary care physician please Get Medicines reviewed and adjusted.  Please request your Prim.MD to go over all Hospital Tests and Procedure/Radiological results at the follow up, please get all Hospital records sent to your Prim MD by signing hospital release before you go home.  If you experience worsening of your admission symptoms, develop shortness of breath, life threatening emergency, suicidal or homicidal thoughts you must seek medical attention immediately by calling 911 or calling your MD immediately  if symptoms less severe.  You Must read complete instructions/literature along with all the possible adverse reactions/side effects for all the Medicines you take and that have been prescribed to you. Take any new Medicines after you have completely understood and accpet all the possible adverse reactions/side effects.   Increase activity slowly   Complete by: As directed        Discharge Medications   Allergies as of 08/09/2021       Reactions   Penicillins    Other reaction(s): Unknown        Medication List     TAKE these medications    acetaminophen 500 MG tablet Commonly known as: TYLENOL Take 1,000 mg by mouth every 4 (four) hours as needed for moderate pain, headache or fever.   dofetilide 250 MCG capsule Commonly known as: TIKOSYN Take 250 mcg by mouth 2 (two) times daily.   fluticasone 50 MCG/ACT nasal spray Commonly known as: FLONASE Place 2 sprays into both nostrils at bedtime.   guaiFENesin 600 MG 12 hr tablet Commonly known as: MUCINEX Take 600 mg by mouth 2 (two) times daily as needed for cough.   magnesium oxide 400 (240 Mg) MG tablet Commonly known as: MAG-OX Take 400 mg by  mouth at bedtime.   metoprolol succinate 50 MG 24 hr tablet Commonly known as: TOPROL-XL Take 1 tablet (50 mg total) by mouth at bedtime. Start taking on: Aug 10, 2021   rivaroxaban 20 MG Tabs tablet Commonly known as: XARELTO Take 1 tablet (20 mg total) by mouth daily with supper.   rosuvastatin  10 MG tablet Commonly known as: CRESTOR Take 1 tablet (10 mg total) by mouth at bedtime. What changed:  medication strength how much to take   Vitamin D3 Ultra Strength 125 MCG (5000 UT) capsule Generic drug: Cholecalciferol Take 5,000 Units by mouth daily.   zinc gluconate 50 MG tablet Take 50 mg by mouth daily.         Follow-up Information     [facility]. Schedule an appointment as soon as possible for a visit in 1 month(s).   Specialty: Neurology Why: stroke clinic Contact information: [address] [place] [phone]        Lawerance Cruel, MD. Schedule an appointment as soon as possible for a visit in 1 week(s).   Specialty: Family Medicine Contact information: [place] [address] [phone]         Adrian Prows, MD. Schedule an appointment as soon as possible for a visit in 1 week(s).   Specialty: Cardiology Contact information: [place] [address] [phone]                 Major procedures and Radiology Reports - PLEASE review detailed and final reports thoroughly  -       MR BRAIN WO CONTRAST  Result Date: 08/08/2021 CLINICAL DATA:  Slurred speech, stroke suspected EXAM: MRI HEAD WITHOUT CONTRAST TECHNIQUE: Multiplanar, multiecho pulse sequences of the brain and surrounding structures were obtained without intravenous contrast. COMPARISON:  No prior MRI, correlation is made with 08/07/2021 CT head FINDINGS: Brain: Small areas of mildly increased signal on diffusion-weighted imaging with ADC correlate in the right frontal lobe  (series 5, images 86-90) and right occipital lobe (series 5, images 70-71). No acute hemorrhage, mass, mass effect, or midline shift. No hydrocephalus or extra-axial collection. Encephalomalacia in the left parietal and occipital lobes is consistent with remote infarct. Scattered T2 hyperintense signal in the periventricular white matter, likely the sequela of chronic small vessel ischemic disease. Punctate focus of hemosiderin deposition in the left parietal lobe (series 14, image 40), possibly hypertensive microhemorrhage. Vascular: Normal arterial flow voids. Skull and upper cervical spine: Normal marrow signal. Sinuses/Orbits: Mucosal thickening throughout the paranasal sinuses, with sequela of prior endoscopic sinus surgery, with a right nasal polyp. The orbits are unremarkable. Other: The mastoids are well aerated. IMPRESSION: Small acute and/or subacute infarcts in the right frontal lobe and right occipital lobe. Code stroke imaging results were communicated on 08/08/2021 at 12:49 am to provider BHAGAT via secure text paging. Electronically Signed   By: Merilyn Baba M.D.   On: 08/08/2021 00:50   ECHOCARDIOGRAM COMPLETE  Result Date: 08/08/2021    ECHOCARDIOGRAM REPORT   Patient Name:   GARREN GREENMAN Date of Exam: 08/08/2021 Medical Rec #:  [id number]       Height:       72.0 in Accession #:    [id number]      Weight:       170.0 lb Date of Birth:  1950-06-03       BSA:          1.988 m Patient Age:    57 years        BP:           86/53 mmHg Patient Gender: M               HR:           55 bpm.  Exam Location:  Inpatient Procedure: 2D Echo, Cardiac Doppler, Color Doppler and Intracardiac            Opacification Agent Indications:     Stroke. TIA  History:         Patient has prior history of Echocardiogram examinations, most                  recent 07/08/2020. Arrythmias:Bradycardia;                  Signs/Symptoms:Hypotension. Vasovagal syndrone. Covid 19                  positive.  Sonographer:      Merrie Roof RDCS Referring Phys:  [id number] [place] Diagnosing Phys: Rex Kras DO IMPRESSIONS  1. Left ventricular ejection fraction, by estimation, is 60 to 65%. The left ventricle has normal function. The left ventricle has no regional wall motion abnormalities. Left ventricular diastolic parameters were normal.  2. Right ventricular systolic function is normal. The right ventricular size is normal. There is normal pulmonary artery systolic pressure.  3. Left atrial size was moderately dilated.  4. The mitral valve is grossly normal. Mild mitral valve regurgitation. No evidence of mitral stenosis.  5. The aortic valve is tricuspid. Aortic valve regurgitation is not visualized. No aortic stenosis is present.  6. Aortic proximal ascending aorta within normal limits. There is borderline dilatation of the aortic root, measuring 38 mm. Comparison(s): No significant change when compared to prior echo dated 10/20/2020. Conclusion(s)/Recommendation(s): No intracardiac source of embolism detected on this transthoracic study. Consider a transesophageal echocardiogram to exclude cardiac source of embolism if clinically indicated. No left ventricular mural or apical thrombus/thrombi. FINDINGS  Left Ventricle: Left ventricular ejection fraction, by estimation, is 60 to 65%. The left ventricle has normal function. The left ventricle has no regional wall motion abnormalities. Definity contrast agent was given IV to delineate the left ventricular  endocardial borders. The left ventricular internal cavity size was normal in size. There is no left ventricular hypertrophy. Left ventricular diastolic parameters were normal. Right Ventricle: The right ventricular size is normal. No increase in right ventricular wall thickness. Right ventricular systolic function is normal. There is normal pulmonary artery systolic pressure. Left Atrium: Left atrial size was moderately dilated. Right Atrium: Right atrial size was normal in  size. Pericardium: There is no evidence of pericardial effusion. Mitral Valve: The mitral valve is grossly normal. Mild mitral valve regurgitation, with eccentric medially directed jet. No evidence of mitral valve stenosis. Tricuspid Valve: The tricuspid valve is normal in structure. Tricuspid valve regurgitation is not demonstrated. No evidence of tricuspid stenosis. Aortic Valve: The aortic valve is tricuspid. Aortic valve regurgitation is not visualized. No aortic stenosis is present. Aortic valve mean gradient measures 3.0 mmHg. Aortic valve peak gradient measures 5.4 mmHg. Aortic valve area, by VTI measures 2.93 cm. Pulmonic Valve: The pulmonic valve was grossly normal. Pulmonic valve regurgitation is not visualized. No evidence of pulmonic stenosis. Aorta: Proximal ascending aorta within normal limits. There is borderline dilatation of the aortic root, measuring 38 mm. IAS/Shunts: The interatrial septum appears to be lipomatous. The atrial septum is grossly normal.  LEFT VENTRICLE PLAX 2D LVIDd:         4.90 cm      Diastology LVIDs:         3.60 cm      LV e' medial:    7.93 cm/s LV PW:  0.90 cm      LV E/e' medial:  9.6 LV IVS:        0.80 cm      LV e' lateral:   13.10 cm/s LVOT diam:     2.20 cm      LV E/e' lateral: 5.8 LV SV:         75 LV SV Index:   38 LVOT Area:     3.80 cm  LV Volumes (MOD) LV vol d, MOD A2C: 114.0 ml LV vol d, MOD A4C: 132.0 ml LV vol s, MOD A2C: 45.7 ml LV vol s, MOD A4C: 32.6 ml LV SV MOD A2C:     68.3 ml LV SV MOD A4C:     132.0 ml LV SV MOD BP:      83.2 ml RIGHT VENTRICLE RV Basal diam:  3.40 cm RV S prime:     12.00 cm/s TAPSE (M-mode): 2.2 cm LEFT ATRIUM            Index        RIGHT ATRIUM           Index LA diam:      4.50 cm  2.26 cm/m   RA Area:     17.00 cm LA Vol (A2C): 108.0 ml 54.32 ml/m  RA Volume:   38.00 ml  19.11 ml/m  AORTIC VALVE AV Area (Vmax):    3.41 cm AV Area (Vmean):   3.34 cm AV Area (VTI):     2.93 cm AV Vmax:           116.00 cm/s AV  Vmean:          74.400 cm/s AV VTI:            0.256 m AV Peak Grad:      5.4 mmHg AV Mean Grad:      3.0 mmHg LVOT Vmax:         104.00 cm/s LVOT Vmean:        65.300 cm/s LVOT VTI:          0.197 m LVOT/AV VTI ratio: 0.77  AORTA Ao Root diam: 3.80 cm Ao Asc diam:  3.60 cm MITRAL VALVE MV Area (PHT): 2.83 cm    SHUNTS MV Decel Time: 268 msec    Systemic VTI:  0.20 m MV E velocity: 76.40 cm/s  Systemic Diam: 2.20 cm MV A velocity: 87.00 cm/s MV E/A ratio:  0.88 Sunit Tolia DO Electronically signed by Rex Kras DO Signature Date/Time: 08/08/2021/3:16:43 PM    Final    CT HEAD CODE STROKE WO CONTRAST  Result Date: 08/07/2021 CLINICAL DATA:  Code stroke.  Slurred speech EXAM: CT HEAD WITHOUT CONTRAST TECHNIQUE: Contiguous axial images were obtained from the base of the skull through the vertex without intravenous contrast. RADIATION DOSE REDUCTION: This exam was performed according to the departmental dose-optimization program which includes automated exposure control, adjustment of the mA and/or kV according to patient size and/or use of iterative reconstruction technique. COMPARISON:  None Available. FINDINGS: Brain: No evidence of acute infarction, hemorrhage, cerebral edema, mass, mass effect, or midline shift. Ventricles and sulci are normal for age. No extra-axial fluid collection. Hypodensity in the left parieto-occipital region, consistent with a remote infarct. Vascular: No hyperdense vessel. Atherosclerotic calcifications in the intracranial carotid and vertebral arteries. No Skull: Negative for fracture or focal lesion. Sinuses/Orbits: Mucosal thickening in the frontal sinuses and remaining ethmoid air cells, pass post partial ethmoidectomy and right maxillary antrostomy. Right  sinonasal polyp (series 4, image 15). Other: The mastoid air cells are well aerated. ASPECTS [facility] Stroke Program Early CT Score) - Ganglionic level infarction (caudate, lentiform nuclei, internal capsule, insula, M1-M3  cortex): 7 - Supraganglionic infarction (M4-M6 cortex): 3 Total score (0-10 with 10 being normal): 10 IMPRESSION: 1. No acute intracranial process. 2. ASPECTS is 10 Code stroke imaging results were communicated on 08/07/2021 at 8:23 pm to provider BHAGAT via secure text paging. Electronically Signed   By: Merilyn Baba M.D.   On: 08/07/2021 20:24   CT ANGIO HEAD NECK W WO CM (CODE STROKE)  Result Date: 08/07/2021 CLINICAL DATA:  Slurred speech, stroke suspected EXAM: CT ANGIOGRAPHY HEAD AND NECK TECHNIQUE: Multidetector CT imaging of the head and neck was performed using the standard protocol during bolus administration of intravenous contrast. Multiplanar CT image reconstructions and MIPs were obtained to evaluate the vascular anatomy. Carotid stenosis measurements (when applicable) are obtained utilizing NASCET criteria, using the distal internal carotid diameter as the denominator. RADIATION DOSE REDUCTION: This exam was performed according to the departmental dose-optimization program which includes automated exposure control, adjustment of the mA and/or kV according to patient size and/or use of iterative reconstruction technique. CONTRAST:  12m OMNIPAQUE IOHEXOL 350 MG/ML SOLN COMPARISON:  No prior CTA, correlation is made with CT head 08/07/2021 FINDINGS: CT HEAD FINDINGS For noncontrast findings, please see same day CT head. CTA NECK FINDINGS Aortic arch: Standard branching. Imaged portion shows no evidence of aneurysm or dissection. No significant stenosis of the major arch vessel origins. Right carotid system: No evidence of dissection, occlusion, or hemodynamically significant stenosis (greater than 50%). Left carotid system: No evidence of dissection, occlusion, or hemodynamically significant stenosis (greater than 50%). Vertebral arteries: No evidence of dissection, occlusion, or hemodynamically significant stenosis (greater than 50%). Skeleton: No acute osseous abnormality. Other neck: No acute  finding. Upper chest: No focal pulmonary opacity or pleural effusion. Review of the MIP images confirms the above findings CTA HEAD FINDINGS Anterior circulation: Both internal carotid arteries are patent to the termini, without significant stenosis. A1 segments patent. Normal anterior communicating artery. Anterior cerebral arteries are patent to their distal aspects. No M1 stenosis or occlusion. Normal MCA bifurcations. Distal MCA branches perfused and symmetric. Posterior circulation: Vertebral arteries patent to the vertebrobasilar junction without stenosis. Posterior inferior cerebellar arteries patent proximally. Basilar patent to its distal aspect. Superior cerebellar arteries patent proximally. Patent P1 segments. Mild irregularity in the left P2 without significant stenosis. PCAs perfused to their distal aspects without stenosis. The bilateral posterior communicating arteries are not visualized. Venous sinuses: As permitted by contrast timing, patent. Anatomic variants: None significant. Review of the MIP images confirms the above findings IMPRESSION: 1.  No intracranial large vessel occlusion or significant stenosis. 2.  No hemodynamically significant stenosis in the neck. Electronically Signed   By: AMerilyn BabaM.D.   On: 08/07/2021 21:18     Today   Subjective    RChay Mazzonitoday has no headache,no chest abdominal pain,no new weakness tingling or numbness, feels much better wants to go home today.    Objective   Blood pressure 128/90, pulse (!) 55, temperature 97.8 F (36.6 C), temperature source Oral, resp. rate 11, height 6' (1.829 m), weight 77.1 kg, SpO2 97 %.   Intake/Output Summary (Last 24 hours) at 08/09/2021 0846 Last data filed at 08/08/2021 2316 Gross per 24 hour  Intake 1186.67 ml  Output 600 ml  Net 586.67 ml    Exam  Awake Alert,  No new F.N deficits,  mild dysarthria [place].AT,PERRAL Supple Neck,   Symmetrical Chest wall movement, Good air movement bilaterally,  CTAB RRR,No Gallops,   +ve B.Sounds, Abd Soft, Non tender,  No Cyanosis, Clubbing or edema    Data Review   Recent Labs  Lab 08/07/21 2009 08/07/21 2015 08/09/21 0141  WBC 4.9  --  4.9  HGB 15.3 15.3 14.2  HCT 48.2 45.0 42.8  PLT 182  --  186  MCV 96.8  --  89.7  MCH 30.7  --  29.8  MCHC 31.7  --  33.2  RDW 13.2  --  13.0  LYMPHSABS 1.6  --  1.1  MONOABS 0.4  --  0.5  EOSABS 0.1  --  0.1  BASOSABS 0.1  --  0.0    Recent Labs  Lab 08/07/21 2009 08/07/21 2015 08/08/21 0558 08/08/21 1446 08/09/21 0141  NA 137 139 139  --  142  K 5.8* 5.4* 3.5 3.7 3.5  CL 108 106 107  --  108  CO2 20*  --  25  --  26  GLUCOSE 80 82 169*  --  102*  BUN 20 31* 16  --  10  CREATININE 1.17 1.20 1.06  --  1.08  CALCIUM 8.8*  --  8.6*  --  8.5*  AST 39  --  16  --  15  ALT 12  --  17  --  16  ALKPHOS 43  --  39  --  41  BILITOT 1.9*  --  0.6  --  0.7  ALBUMIN 3.4*  --  3.1*  --  2.9*  MG  --   --   --   --  1.7  CRP  --   --  <0.5  --  0.5  DDIMER  --   --  0.39  --   --   INR 1.0  --   --   --   --   HGBA1C 5.5  --   --   --   --    Lab Results  Component Value Date   CHOL 144 08/08/2021   HDL 33 (L) 08/08/2021   LDLCALC 87 08/08/2021   TRIG 121 08/08/2021   CHOLHDL 4.4 08/08/2021    Total Time in preparing paper work, data evaluation and todays exam - 30 minutes  Lala Lund M.D on 08/09/2021 at 8:46 AM  [facility]

## 2021-08-09 NOTE — Plan of Care (Signed)
  Problem: Education: Goal: Knowledge of disease or condition will improve 08/09/2021 0410 by Doreatha Martin, RN Outcome: Progressing 08/09/2021 0410 by Doreatha Martin, RN Outcome: Progressing Goal: Knowledge of secondary prevention will improve (SELECT ALL) Outcome: Progressing

## 2021-08-11 ENCOUNTER — Telehealth: Payer: Self-pay

## 2021-08-11 ENCOUNTER — Encounter: Payer: Self-pay | Admitting: Cardiology

## 2021-08-11 NOTE — Telephone Encounter (Signed)
Called pt for TOC, no answer. Left vm requesting call back.

## 2021-08-12 ENCOUNTER — Telehealth: Payer: Self-pay

## 2021-08-12 NOTE — Telephone Encounter (Signed)
Location of hospitalization: [place] Reason for hospitalization: Stroke Date of discharge: 08/09/2021 Date of first communication with patient: today Person contacting patient: Michail Sermon Current symptoms: Patient stated he feel much better as the days go by.  Do you understand why you were in the Hospital: Yes Questions regarding discharge instructions: None Where were you discharged to: Home Medications reviewed: Yes Allergies reviewed: Yes Dietary changes reviewed: Yes. Discussed low fat and low salt diet.  Referals reviewed: NA Activities of Daily Living: Able to with mild limitations Any transportation issues/concerns: None Any patient concerns: None Confirmed importance & date/time of Follow up appt: Yes Confirmed with patient if condition begins to worsen call. Pt was given the office number and encouraged to call back with questions or concerns: Yes

## 2021-08-12 NOTE — Telephone Encounter (Signed)
TOC is done

## 2021-08-14 DIAGNOSIS — I6340 Cerebral infarction due to embolism of unspecified cerebral artery: Secondary | ICD-10-CM | POA: Diagnosis not present

## 2021-08-14 DIAGNOSIS — Z09 Encounter for follow-up examination after completed treatment for conditions other than malignant neoplasm: Secondary | ICD-10-CM | POA: Diagnosis not present

## 2021-08-14 DIAGNOSIS — R899 Unspecified abnormal finding in specimens from other organs, systems and tissues: Secondary | ICD-10-CM | POA: Diagnosis not present

## 2021-08-14 DIAGNOSIS — I6930 Unspecified sequelae of cerebral infarction: Secondary | ICD-10-CM | POA: Diagnosis not present

## 2021-08-14 DIAGNOSIS — U071 COVID-19: Secondary | ICD-10-CM | POA: Diagnosis not present

## 2021-08-18 ENCOUNTER — Encounter: Payer: Self-pay | Admitting: Student

## 2021-08-18 ENCOUNTER — Ambulatory Visit: Payer: PPO | Admitting: Student

## 2021-08-18 VITALS — BP 111/68 | HR 56 | Temp 98.1°F | Resp 17 | Ht 72.0 in | Wt 178.8 lb

## 2021-08-18 DIAGNOSIS — I480 Paroxysmal atrial fibrillation: Secondary | ICD-10-CM | POA: Diagnosis not present

## 2021-08-18 DIAGNOSIS — I10 Essential (primary) hypertension: Secondary | ICD-10-CM | POA: Diagnosis not present

## 2021-08-18 NOTE — Progress Notes (Addendum)
Primary Physician/Referring:  Lawerance Cruel, MD  Patient ID: Terry [name], male    DOB: [birth year], 71 y.o.   MRN: [id number]  Chief Complaint  Patient presents with   [facility]   Transitions Of Care    2 weeks    HPI:    Terry [name]  is a 71 y.o. 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 Patient has family history of CAD.   Notably patient was admitted 08/2020 with retroperitoneal bleed due to patient being on Eliquis with mass-effect on the right kidney.  At that time Eliquis was discontinued given relatively low CHA2DS2-VASc score and bleeding. Patient was last seen in our office 01/21/2021 by Dr. Einar Gip at which time he was stable from a cardiovascular standpoint and advised to follow-up in 1 year.  However patient subsequently presented to [facility] emergency department with left-sided facial droop and expressive aphasia as well as weakness of the upper extremities on 08/07/2021, further evaluation revealed right MCA infarct.  Shared decision given history of paroxysmal atrial fibrillation and was to resume oral anticoagulation with Xarelto.  Joint decision with neurology was to discontinue antiplatelet therapy.  Patient now presents for follow-up. He is currently tolerating Xarelto without bleeding diathesis. Reports fatigue and weakness is improving. He has some residual mild facial droop. Patient will be starting speech therapy shortly.  Patient is accompanied by his wife and daughter at today's office visit.    Past Medical History:  Diagnosis Date   Aortic root dilation ([place])    a. mild by CT 10/2017.   Basal cell carcinoma 10/02/2019   left hand bcc nod.   Cardiomyopathy [facility])    Central retinal vein occlusion    Chronic systolic CHF (congestive heart failure) (HCC)    ED  (erectile dysfunction)    H/O cardiac catheterization 2002 - normal cors   HTN (hypertension)    Hyperlipidemia    Hypokalemia    Leg edema    LV dysfunction    a. EF appeared mildly depressed by TEE 08/2015, no % given.   Mini stroke    a. ? Age 18 at time of central retinal vein occlusion - lost vision in eye   PAF (paroxysmal atrial fibrillation) ([place])    a. Dx 06/2015, s/p TEE/DCCV 08/2015.   Pericarditis 2010   Syncope    Hx of thought to be due to orthostatic hypotension   Visit for monitoring Tikosyn therapy 01/17/2018   Past Surgical History:  Procedure Laterality Date   CARDIAC CATHETERIZATION  2002   CARDIOVERSION N/A 09/08/2015   Procedure: CARDIOVERSION;  Surgeon: Fay Records, MD;  Location: [facility];  Service: Cardiovascular;  Laterality: N/A;   CARDIOVERSION N/A 06/28/2017   Procedure: CARDIOVERSION;  Surgeon: Jerline Pain, MD;  Location: [facility] ENDOSCOPY;  Service: Cardiovascular;  Laterality: N/A;   HAND SURGERY  2003   KNEE SURGERY     NASAL SINUS SURGERY  2003   x2. Scar tissue build up and had to recreate his tear duct   PERICARDIECTOMY     TEE WITHOUT CARDIOVERSION N/A 09/08/2015   Procedure: TRANSESOPHAGEAL ECHOCARDIOGRAM (TEE);  Surgeon: Fay Records, MD;  Location: [facility] ENDOSCOPY;  Service: Cardiovascular;  Laterality: N/A;   TEE WITHOUT CARDIOVERSION N/A 06/28/2017   Procedure: TRANSESOPHAGEAL ECHOCARDIOGRAM (TEE);  Surgeon: Jerline Pain, MD;  Location: [facility] ENDOSCOPY;  Service: Cardiovascular;  Laterality: N/A;   Family History  Problem Relation Age of Onset   Hypertension Mother    Diabetes Mother    Cancer Mother    Atrial fibrillation Father    Heart disease Father        had pacemaker   Hypertension Father    Diabetes Father    Atrial fibrillation Brother    Heart attack Brother 74    Social History   Tobacco Use   Smoking status: Never   Smokeless tobacco: Never  Substance Use Topics   Alcohol use: Not Currently    Comment: 2-3 Times a  Week Wine with Meals   Marital Status: Married  ROS  Review of Systems  Cardiovascular:  Positive for leg swelling (chronic, stable). Negative for chest pain and dyspnea on exertion.  Respiratory:  Positive for snoring (negative sleep study per patient).   Gastrointestinal:  Negative for melena.   Objective  Blood pressure 111/68, pulse (!) 56, temperature 98.1 F (36.7 C), temperature source Temporal, resp. rate 17, height 6' (1.829 m), weight 178 lb 12.8 oz (81.1 kg), SpO2 97 %. Body mass index is 24.25 kg/m.     08/18/2021    2:57 PM 08/09/2021    3:35 AM 08/08/2021   11:16 PM  Vitals with BMI  Height '6\' 0"'$     Weight 178 lbs 13 oz    BMI 44.01    Systolic 027 253 664  Diastolic 68 90 91  Pulse 56 55 62     Physical Exam Vitals reviewed.  Neck:     Vascular: No carotid bruit or JVD.  Cardiovascular:     Rate and Rhythm: Normal rate and regular rhythm.     Pulses: Intact distal pulses.     Heart sounds: Normal heart sounds. No murmur heard.   No gallop.  Pulmonary:     Effort: Pulmonary effort is normal.     Breath sounds: Normal breath sounds.  Musculoskeletal:     Right lower leg: Edema (1+ bilateral leg edema below knee) present.     Left lower leg: Edema (1+ bilateral leg edema below knee) present.     Laboratory examination:   Recent Labs    08/07/21 2009 08/07/21 2015 08/08/21 0558 08/08/21 1446 08/09/21 0141  NA 137 139 139  --  142  K 5.8* 5.4* 3.5 3.7 3.5  CL 108 106 107  --  108  CO2 20*  --  25  --  26  GLUCOSE 80 82 169*  --  102*  BUN 20 31* 16  --  10  CREATININE 1.17 1.20 1.06  --  1.08  CALCIUM 8.8*  --  8.6*  --  8.5*  GFRNONAA >60  --  >60  --  >60   estimated creatinine clearance is 71.9 mL/min (by C-G formula based on SCr of 1.08 mg/dL).     Latest Ref Rng & Units 08/09/2021    1:41 AM 08/08/2021    2:46 PM 08/08/2021    5:58 AM  CMP  Glucose 70 - 99 mg/dL 102    169    BUN 8 - 23 mg/dL 10    16    Creatinine 0.61 - 1.24 mg/dL 1.08     1.06    Sodium 135 - 145 mmol/L 142    139    Potassium 3.5 - 5.1 mmol/L 3.5   3.7   3.5    Chloride 98 - 111 mmol/L 108  107    CO2 22 - 32 mmol/L 26    25    Calcium 8.9 - 10.3 mg/dL 8.5    8.6    Total Protein 6.5 - 8.1 g/dL 5.3    5.5    Total Bilirubin 0.3 - 1.2 mg/dL 0.7    0.6    Alkaline Phos 38 - 126 U/L 41    39    AST 15 - 41 U/L 15    16    ALT 0 - 44 U/L 16    17        Latest Ref Rng & Units 08/09/2021    1:41 AM 08/07/2021    8:15 PM 08/07/2021    8:09 PM  CBC  WBC 4.0 - 10.5 K/uL 4.9    4.9    Hemoglobin 13.0 - 17.0 g/dL 14.2   15.3   15.3    Hematocrit 39.0 - 52.0 % 42.8   45.0   48.2    Platelets 150 - 400 K/uL 186    182      Lipid Panel Recent Labs    08/08/21 0558  CHOL 144  TRIG 121  LDLCALC 87  VLDL 24  HDL 33*  CHOLHDL 4.4   Lipid Panel     Component Value Date/Time   CHOL 144 08/08/2021 0558   CHOL 155 10/08/2019 0817   TRIG 121 08/08/2021 0558   HDL 33 (L) 08/08/2021 0558   HDL 36 (L) 10/08/2019 0817   CHOLHDL 4.4 08/08/2021 0558   VLDL 24 08/08/2021 0558   LDLCALC 87 08/08/2021 0558   LDLCALC 95 10/08/2019 0817   LABVLDL 24 10/08/2019 0817     HEMOGLOBIN A1C Lab Results  Component Value Date   HGBA1C 5.5 08/07/2021   MPG 111.15 08/07/2021   Magnesium level 01/15/2021: 2.2.  TSH No results for input(s): TSH in the last 8760 hours.  External labs:   05/23/2020 Magnesium 2.1  08/19/2020 Magnesium 1.9  08/22/2020 Magnesium 1.8  09/30/2020 Potassium of 4.6  Medications and allergies   Allergies  Allergen Reactions   Losartan Other (See Comments)    Makes BP drop very low   Penicillins Other (See Comments)    Childhood reaction Has patient had a PCN reaction causing immediate rash, facial/tongue/throat swelling, SOB or lightheadedness with hypotension: yes Has patient had a PCN reaction causing severe rash involving mucus membranes or skin necrosis: no Has patient had a PCN reaction that required hospitalization:  no Has patient had a PCN reaction occurring within the last 10 years: no If all of the above answers are "NO", then may proceed with Cephalosporin use.    Penicillins     Other reaction(s): Unknown     Medication prior to this encounter:   Outpatient Medications Prior to Visit  Medication Sig Dispense Refill   acetaminophen (TYLENOL) 500 MG tablet Take 1,000 mg by mouth every 4 (four) hours as needed for moderate pain, headache or fever.     Cholecalciferol (VITAMIN D3 ULTRA STRENGTH) 125 MCG (5000 UT) capsule Take 5,000 Units by mouth daily.     dofetilide (TIKOSYN) 250 MCG capsule TAKE 1 CAPSULE BY MOUTH 2 TIMES DAILY. 180 capsule 3   fluticasone (FLONASE) 50 MCG/ACT nasal spray Place 2 sprays into both nostrils every evening.      fluticasone (FLONASE) 50 MCG/ACT nasal spray Place 2 sprays into both nostrils at bedtime.     guaiFENesin (MUCINEX) 600 MG 12 hr tablet Take 600 mg by mouth  2 (two) times daily as needed for cough.     magnesium oxide (MAG-OX) 400 (240 Mg) MG tablet TAKE 1 TABLET BY MOUTH EVERY DAY 120 tablet 2   magnesium oxide (MAG-OX) 400 (240 Mg) MG tablet Take 400 mg by mouth at bedtime.     metoprolol succinate (TOPROL-XL) 50 MG 24 hr tablet Take 1 tablet (50 mg total) by mouth daily. 90 tablet 3   Probiotic Product (PROBIOTIC DAILY PO) Take 1 capsule by mouth daily.     rivaroxaban (XARELTO) 20 MG TABS tablet Take 1 tablet (20 mg total) by mouth daily with supper. 30 tablet 0   rosuvastatin (CRESTOR) 10 MG tablet Take 1 tablet (10 mg total) by mouth at bedtime. 30 tablet 0   zinc gluconate 50 MG tablet Take 50 mg by mouth daily.     simvastatin (ZOCOR) 20 MG tablet Take 20 mg by mouth daily.     dofetilide (TIKOSYN) 250 MCG capsule Take 250 mcg by mouth 2 (two) times daily.     metoprolol succinate (TOPROL-XL) 50 MG 24 hr tablet Take 1 tablet (50 mg total) by mouth at bedtime.     rosuvastatin (CRESTOR) 5 MG tablet Take 5 mg by mouth every evening.     No  facility-administered medications prior to visit.     Medication list after today's encounter   Current Outpatient Medications  Medication Instructions   acetaminophen (TYLENOL) 1,000 mg, Oral, Every 4 hours PRN   dofetilide (TIKOSYN) 250 MCG capsule TAKE 1 CAPSULE BY MOUTH 2 TIMES DAILY.   fluticasone (FLONASE) 50 MCG/ACT nasal spray 2 sprays, Each Nare, Every evening   fluticasone (FLONASE) 50 MCG/ACT nasal spray 2 sprays, Each Nare, Daily at bedtime   guaiFENesin (MUCINEX) 600 mg, Oral, 2 times daily PRN   magnesium oxide (MAG-OX) 400 (240 Mg) MG tablet TAKE 1 TABLET BY MOUTH EVERY DAY   magnesium oxide (MAG-OX) 400 mg, Oral, Daily at bedtime   metoprolol succinate (TOPROL-XL) 50 mg, Oral, Daily   Probiotic Product (PROBIOTIC DAILY PO) 1 capsule, Oral, Daily   rivaroxaban (XARELTO) 20 mg, Oral, Daily with supper   rosuvastatin (CRESTOR) 10 mg, Oral, Daily at bedtime   Vitamin D3 Ultra Strength 5,000 Units, Oral, Daily   zinc gluconate 50 mg, Oral, Daily    Radiology:   No results found.  CT Angio Chest/Abd/Pelvis 08/19/2020: 1. Large right-sided perinephric hematoma with evidence of active extravasation from the midpole lower pole junction region laterally. The hematoma measures approximately 11.5 x 10.0 x 9.0 cm with mass effect on the right kidney. No obvious underlying mass is identified. Recommend interventional radiology consultation. 2. No thoracic or abdominal aortic aneurysm or dissection. 3. Irregular and beaded appearance of the right renal arteries could not exclude FMD. There are 2 right and 2 left renal arteries. 4. No acute pulmonary findings or worrisome pulmonary lesions. 5. Aortic atherosclerosis.  ADDENDUM REPORT: 10/17/2020 15:15   ADDENDUM: I reviewed this study for Dr. Adrian Prows. Concern for aortic root dilatation. There is moderate artifact through the region of the aortic root. The maximum diameter at the sinuses of Valsalva is 41.5 mm and  the diameter at the sinotubular junction is 2.9 cm.   Electronically Signed   By: Marijo Sanes M.D.   On: 10/17/2020 15:15  Cardiac Studies:   Coronary CTA 11/10/2017: 1. Calcium score 5 isolated area in mid LAD this is 26 th percentile for age and sex.  2.  Normal coronary arteries.  3.  Mild aortic root dilatation 4.0 cm.  PCV ECHOCARDIOGRAM COMPLETE 16/96/7893 Normal LV systolic function with visual EF 55-60%. Left ventricle cavity is normal in size. Normal left ventricular wall thickness. Normal global wall motion. Normal diastolic filling pattern, normal LAP. Left atrial cavity is normal in size. A lipomatous septum is present. Mild (Grade I) mitral regurgitation. Mild tricuspid regurgitation. Mild pulmonic regurgitation. The aortic root is mildly dilated (3.8cm). Proximal ascending aorta not well visualized. Compared to study 10/25/2018: no significant change.    EKG:  08/18/2021: Sinus bradycardia at rate of 54 bpm, left atrial enlargement, normal axis, no evidence of ischemia, normal QT interval.   EKG 01/21/2021: Normal sinus rhythm at rate of 57 bpm, left atrial enlargement, normal axis, no evidence of ischemia, normal QT interval.  No significant change from 10/16/2020.   Assessment     ICD-10-CM   1. Essential hypertension  I10 EKG 12-Lead    2. Paroxysmal atrial fibrillation (HCC)  I48.0 Ambulatory referral to Cardiac Electrophysiology       Medications Discontinued During This Encounter  Medication Reason   metoprolol succinate (TOPROL-XL) 50 MG 24 hr tablet Patient has not taken in last 30 days   rosuvastatin (CRESTOR) 5 MG tablet Change in therapy   simvastatin (ZOCOR) 20 MG tablet Change in therapy   dofetilide (TIKOSYN) 250 MCG capsule Change in therapy     No orders of the defined types were placed in this encounter.  Orders Placed This Encounter  Procedures   Ambulatory referral to Cardiac Electrophysiology    Referral Priority:   Routine     Referral Type:   Consultation    Referral Reason:   Specialty Services Required    Requested Specialty:   Cardiology    Number of Visits Requested:   1   EKG 12-Lead   CHADS-VASc - score of 1 for male gender Recommendations:   Terry [name] is a 72 y.o. Caucasian male with family history of atrial fibrillation and also paroxysmal atrial fibrillation on dofetilide and Eliquis, hypertension, chronic systolic and diastolc heart failure with recovered LVEF, aortic dilation at 4.0 cm, recurrent vasovagal syncope, and right retinal vein occlusion at age 25 Y.  He also has multiple members in the family who had coronary artery disease.  He is on low-dose statin.  Notably patient was admitted 08/2020 with retroperitoneal bleed due to patient being on Eliquis with mass-effect on the right kidney.  At that time Eliquis was discontinued given relatively low CHA2DS2-VASc score and bleeding. Patient was last seen in our office 01/21/2021 by Dr. Einar Gip at which time he was stable from a cardiovascular standpoint and advised to follow-up in 1 year.  However patient subsequently presented to [facility] emergency department with left-sided facial droop and expressive aphasia as well as weakness of the upper extremities on 08/07/2021, further evaluation revealed right MCA infarct.  Shared decision given history of paroxysmal atrial fibrillation and was to resume oral anticoagulation with Xarelto.  Joint decision with neurology was to discontinue antiplatelet therapy.  Patient now presents for follow-up. He is currently tolerating Xerelto without bleeding diathesis. He is presently maintaining sinus rhythm. Will continue present medications. Given his history of retroperitoneal bleed on anticoagulation with refer for evaluation of watchman device.   [place] Referral for Left Atrial Appendage Closure with Non-Valvular Atrial Fibrillation   Terry [name] is a 71 y.o. male is being referred  to the [facility] Team for evaluation for Left Atrial Appendage Closure with  Watchman device for the management of stroke risk resulting form non-valvular atrial fibrillation.    Base upon Mr. Lingelbach history, he is felt to be a poor candidate for long-term anticoagulation because of a history of bleeding (e.g. intracerebral, subdural, GI, retro-peritoneal).  The patient has a HAS-BLED score of 5 indicating a Yearly Major Bleeding Risk of 12.5%.   This patients CHA2DS2-VASc Score 5 (HTN, Age, CVA, aortic atherosclerosis) and yearly risk of stroke 7.2%.   His stroke risk necessitates a strategy of stroke prevention with either long-term oral anticoagulation or left atrial appendage occlusion therapy. We have discussed their bleeding risk in the context of their comorbid medical problems, as well as the rationale for referral for evaluation of Watchman left atrial appendage occlusion therapy. While the patient is at high long-term bleeding risk, they may be appropriate for short-term anticoagulation. Based on this individual patient's stroke and bleeding risk, a shared decision has been made to refer the patient for consideration of Watchman left atrial appendage closure utilizing the [employer] of Cardiology shared decision tool.   Alethia Berthold, MD, [facility] 08/19/2021, 9:53 AM Office: [phone] Fax: [phone] Pager: [phone]

## 2021-08-19 DIAGNOSIS — I10 Essential (primary) hypertension: Secondary | ICD-10-CM | POA: Diagnosis not present

## 2021-08-19 DIAGNOSIS — Z7901 Long term (current) use of anticoagulants: Secondary | ICD-10-CM | POA: Diagnosis not present

## 2021-08-19 DIAGNOSIS — H348192 Central retinal vein occlusion, unspecified eye, stable: Secondary | ICD-10-CM | POA: Diagnosis not present

## 2021-08-19 DIAGNOSIS — I69322 Dysarthria following cerebral infarction: Secondary | ICD-10-CM | POA: Diagnosis not present

## 2021-08-19 DIAGNOSIS — I480 Paroxysmal atrial fibrillation: Secondary | ICD-10-CM | POA: Diagnosis not present

## 2021-08-19 DIAGNOSIS — I429 Cardiomyopathy, unspecified: Secondary | ICD-10-CM | POA: Diagnosis not present

## 2021-08-19 DIAGNOSIS — K661 Hemoperitoneum: Secondary | ICD-10-CM | POA: Diagnosis not present

## 2021-08-19 DIAGNOSIS — E785 Hyperlipidemia, unspecified: Secondary | ICD-10-CM | POA: Diagnosis not present

## 2021-08-19 DIAGNOSIS — I2510 Atherosclerotic heart disease of native coronary artery without angina pectoris: Secondary | ICD-10-CM | POA: Diagnosis not present

## 2021-08-21 ENCOUNTER — Other Ambulatory Visit: Payer: Self-pay | Admitting: Family Medicine

## 2021-08-21 ENCOUNTER — Ambulatory Visit
Admission: RE | Admit: 2021-08-21 | Discharge: 2021-08-21 | Disposition: A | Discharge status: Home / Self Care | Payer: PPO | Source: Ambulatory Visit | Attending: Family Medicine | Admitting: Family Medicine

## 2021-08-21 DIAGNOSIS — U071 COVID-19: Secondary | ICD-10-CM | POA: Diagnosis not present

## 2021-08-21 DIAGNOSIS — B999 Unspecified infectious disease: Secondary | ICD-10-CM | POA: Diagnosis not present

## 2021-09-01 NOTE — Progress Notes (Deleted)
Patient: Terry [name] Date of Birth: 10/04/1950  Reason for Visit: Follow up stroke 08/07/21 History from: Patient Primary Neurologist: Saw Dr. Erlinda Hong  ASSESSMENT AND PLAN 71 y.o. year old male   1.  Stroke: Right MCA small patchy infarcts -Embolic, likely secondary to PAF not on anticoagulation at the time of CVA -Consultation with neurology, cardiology, decided to restart Xarelto  2.  Paroxysmal atrial fibrillation -Recommend Watchman device given current stroke, waiting for referral appointment 09/10/21 -On Xarelto since 08/08/21  3.  Hypertension  4.  Hyperlipidemia -LDL 87, goal less than 70 -On Crestor 5  HISTORY OF PRESENT ILLNESS: Today 09/01/21 Terry [name] is here today for stroke clinic follow-up for right frontal and occipital lobe infarcts 08/07/21, presented as code stroke with acute onset slurred speech, COVID-positive, no tPA given due to outside window.  He was previously on Eliquis for PAF, in June 2022 he had acute severe retroperitoneal hemorrhage, Eliquis was discontinued, not on any antithrombotic since.  Consultation with neurology, cardiology, patient and family, decided to discontinue aspirin and Plavix in the hospital, restart Xarelto 08/08/21.  -CT head showed no acute abnormality -CT head and neck was unremarkable -MRI of the brain small acute and/or subacute infarcts in the right frontal lobe and right occipital lobe -2D echo EF 60 to 65% -LDL 87 -A1c 5.5 -Not on antithrombotic prior to admission  HISTORY  Copied Dr. Candiss Norse 08/07/21 HPI: Terry [name] is a 71 y.o. male with medical history significant of CAD, paroxysmal Afib no longer on anticoagulation due to history of spontaneous retroperitoneal bleed in June 2022, cardiomyopathy, history of central retinal venous occlusion, HTN, HLD presented to the ED as code stroke for evaluation of acute onset slurred speech, LKW at 7:15 pm on 08/07/2021.  Patient tested positive for COVID 8 days ago.  tPA not  given due to mild symptoms and history of spontaneous retroperitoneal bleed.  CT head negative for acute finding.  CTA head and neck negative for LVO.  Brain MRI showing small acute and/or subacute infarcts in the right frontal lobe and right occipital lobe.  Labs significant for potassium 5.8, bicarb 20.  T. bili 1.9, remainder of LFTs normal.  A1c 5.5.  Repeat labs showing potassium 5.4, bicarb 26.  UA without signs of infection.  UDS negative.  SARS-CoV-2 PCR test positive.   History provided by patient and his wife at bedside.  Yesterday at dinnertime around 7 PM he had acute onset slurred speech with left-sided facial droop and mild left upper extremity weakness.  He was having fevers and diarrhea, diagnosed with COVID 8 days ago but symptoms have now resolved.  Denies cough or shortness of breath.  Reports history of retroperitoneal bleed in June 2022 related to anticoagulation use for A-fib.   REVIEW OF SYSTEMS: Out of a complete 14 system review of symptoms, the patient complains only of the following symptoms, and all other reviewed systems are negative.  See HPI  ALLERGIES: Allergies  Allergen Reactions   Losartan Other (See Comments)    Makes BP drop very low   Penicillins Other (See Comments)    Childhood reaction Has patient had a PCN reaction causing immediate rash, facial/tongue/throat swelling, SOB or lightheadedness with hypotension: yes Has patient had a PCN reaction causing severe rash involving mucus membranes or skin necrosis: no Has patient had a PCN reaction that required hospitalization: no Has patient had a PCN reaction occurring within the last 10 years: no If all of the above answers are "  NO", then may proceed with Cephalosporin use.    Penicillins     Other reaction(s): Unknown    HOME MEDICATIONS: Outpatient Medications Prior to Visit  Medication Sig Dispense Refill   acetaminophen (TYLENOL) 500 MG tablet Take 1,000 mg by mouth every 4 (four) hours as needed  for moderate pain, headache or fever.     Cholecalciferol (VITAMIN D3 ULTRA STRENGTH) 125 MCG (5000 UT) capsule Take 5,000 Units by mouth daily.     dofetilide (TIKOSYN) 250 MCG capsule TAKE 1 CAPSULE BY MOUTH 2 TIMES DAILY. 180 capsule 3   fluticasone (FLONASE) 50 MCG/ACT nasal spray Place 2 sprays into both nostrils every evening.      fluticasone (FLONASE) 50 MCG/ACT nasal spray Place 2 sprays into both nostrils at bedtime.     guaiFENesin (MUCINEX) 600 MG 12 hr tablet Take 600 mg by mouth 2 (two) times daily as needed for cough.     magnesium oxide (MAG-OX) 400 (240 Mg) MG tablet TAKE 1 TABLET BY MOUTH EVERY DAY 120 tablet 2   magnesium oxide (MAG-OX) 400 (240 Mg) MG tablet Take 400 mg by mouth at bedtime.     metoprolol succinate (TOPROL-XL) 50 MG 24 hr tablet Take 1 tablet (50 mg total) by mouth daily. 90 tablet 3   Probiotic Product (PROBIOTIC DAILY PO) Take 1 capsule by mouth daily.     rivaroxaban (XARELTO) 20 MG TABS tablet Take 1 tablet (20 mg total) by mouth daily with supper. 30 tablet 0   rosuvastatin (CRESTOR) 10 MG tablet Take 1 tablet (10 mg total) by mouth at bedtime. 30 tablet 0   zinc gluconate 50 MG tablet Take 50 mg by mouth daily.     No facility-administered medications prior to visit.    PAST MEDICAL HISTORY: Past Medical History:  Diagnosis Date   Aortic root dilation ([place])    a. mild by CT 10/2017.   Basal cell carcinoma 10/02/2019   left hand bcc nod.   Cardiomyopathy [facility])    Central retinal vein occlusion    Chronic systolic CHF (congestive heart failure) ([facility])    ED (erectile dysfunction)    H/O cardiac catheterization 2002 - normal cors   HTN (hypertension)    Hyperlipidemia    Hypokalemia    Leg edema    LV dysfunction    a. EF appeared mildly depressed by TEE 08/2015, no % given.   Mini stroke    a. ? Age 42 at time of central retinal vein occlusion - lost vision in eye   PAF (paroxysmal atrial fibrillation) ([place])    a. Dx 06/2015, s/p TEE/DCCV  08/2015.   Pericarditis 2010   Syncope    Hx of thought to be due to orthostatic hypotension   Visit for monitoring Tikosyn therapy 01/17/2018    PAST SURGICAL HISTORY: Past Surgical History:  Procedure Laterality Date   CARDIAC CATHETERIZATION  2002   CARDIOVERSION N/A 09/08/2015   Procedure: CARDIOVERSION;  Surgeon: Fay Records, MD;  Location: [facility];  Service: Cardiovascular;  Laterality: N/A;   CARDIOVERSION N/A 06/28/2017   Procedure: CARDIOVERSION;  Surgeon: Jerline Pain, MD;  Location: [place] ENDOSCOPY;  Service: Cardiovascular;  Laterality: N/A;   HAND SURGERY  2003   KNEE SURGERY     NASAL SINUS SURGERY  2003   x2. Scar tissue build up and had to recreate his tear duct   PERICARDIECTOMY     TEE WITHOUT CARDIOVERSION N/A 09/08/2015   Procedure: TRANSESOPHAGEAL ECHOCARDIOGRAM (TEE);  Surgeon:  Fay Records, MD;  Location[facility] ENDOSCOPY;  Service: Cardiovascular;  Laterality: N/A;   TEE WITHOUT CARDIOVERSION N/A 06/28/2017   Procedure: TRANSESOPHAGEAL ECHOCARDIOGRAM (TEE);  Surgeon: Jerline Pain, MD;  Location: [facility] ENDOSCOPY;  Service: Cardiovascular;  Laterality: N/A;    FAMILY HISTORY: Family History  Problem Relation Age of Onset   Hypertension Mother    Diabetes Mother    Cancer Mother    Atrial fibrillation Father    Heart disease Father        had pacemaker   Hypertension Father    Diabetes Father    Atrial fibrillation Brother    Heart attack Brother 55    SOCIAL HISTORY: Social History   Socioeconomic History   Marital status: Married    Spouse name: Monia Pouch   Number of children: Not on file   Years of education: Not on file   Highest education level: Not on file  Occupational History   Not on file  Tobacco Use   Smoking status: Never   Smokeless tobacco: Never  Vaping Use   Vaping Use: Never used  Substance and Sexual Activity   Alcohol use: Not Currently    Comment: 2-3 Times a Week Wine with Meals   Drug use: Never   Sexual activity: Not  on file  Other Topics Concern   Not on file  Social History Narrative   ** Merged History Encounter **       Social Determinants of Health   Financial Resource Strain: Not on file  Food Insecurity: Not on file  Transportation Needs: Not on file  Physical Activity: Not on file  Stress: Not on file  Social Connections: Not on file  Intimate Partner Violence: Not on file    PHYSICAL EXAM  There were no vitals filed for this visit. There is no height or weight on file to calculate BMI.  Generalized: Well developed, in no acute distress  Neurological examination  Mentation: Alert oriented to time, place, history taking. Follows all commands speech and language fluent Cranial nerve II-XII: Pupils were equal round reactive to light. Extraocular movements were full, visual field were full on confrontational test. Facial sensation and strength were normal. Uvula tongue midline. Head turning and shoulder shrug  were normal and symmetric. Motor: The motor testing reveals 5 over 5 strength of all 4 extremities. Good symmetric motor tone is noted throughout.  Sensory: Sensory testing is intact to soft touch on all 4 extremities. No evidence of extinction is noted.  Coordination: Cerebellar testing reveals good finger-nose-finger and heel-to-shin bilaterally.  Gait and station: Gait is normal. Tandem gait is normal. Romberg is negative. No drift is seen.  Reflexes: Deep tendon reflexes are symmetric and normal bilaterally.   DIAGNOSTIC DATA (LABS, IMAGING, TESTING) - I reviewed patient records, labs, notes, testing and imaging myself where available.  Lab Results  Component Value Date   WBC 4.9 08/09/2021   HGB 14.2 08/09/2021   HCT 42.8 08/09/2021   MCV 89.7 08/09/2021   PLT 186 08/09/2021      Component Value Date/Time   NA 142 08/09/2021 0141   NA 140 01/15/2021 0720   K 3.5 08/09/2021 0141   CL 108 08/09/2021 0141   CO2 26 08/09/2021 0141   GLUCOSE 102 (H) 08/09/2021 0141    BUN 10 08/09/2021 0141   BUN 14 01/15/2021 0720   CREATININE 1.08 08/09/2021 0141   CREATININE 1.01 01/30/2016 1606   CALCIUM 8.5 (L) 08/09/2021 0141   PROT 5.3 (  L) 08/09/2021 0141   PROT 5.9 (L) 10/08/2019 0817   ALBUMIN 2.9 (L) 08/09/2021 0141   ALBUMIN 4.0 10/08/2019 0817   AST 15 08/09/2021 0141   ALT 16 08/09/2021 0141   ALKPHOS 41 08/09/2021 0141   BILITOT 0.7 08/09/2021 0141   BILITOT 1.0 10/08/2019 0817   GFRNONAA >60 08/09/2021 0141   GFRAA 89 05/02/2020 0726   Lab Results  Component Value Date   CHOL 144 08/08/2021   HDL 33 (L) 08/08/2021   LDLCALC 87 08/08/2021   TRIG 121 08/08/2021   CHOLHDL 4.4 08/08/2021   Lab Results  Component Value Date   HGBA1C 5.5 08/07/2021   No results found for: "VITAMINB12" Lab Results  Component Value Date   TSH 3.390 05/31/2017    Butler Denmark, AGNP-C, DNP 09/01/2021, 2:44 PM [facility] [address], [place], [place] [address] [phone]

## 2021-09-02 ENCOUNTER — Inpatient Hospital Stay: Payer: PPO | Admitting: Neurology

## 2021-09-02 ENCOUNTER — Other Ambulatory Visit: Payer: Self-pay

## 2021-09-02 MED ORDER — ROSUVASTATIN CALCIUM 10 MG PO TABS: 10.0000 mg | ORAL_TABLET | Freq: Every day | ORAL | 5 refills | Status: DC

## 2021-09-02 MED ORDER — RIVAROXABAN 20 MG PO TABS: 20.0000 mg | ORAL_TABLET | Freq: Every day | ORAL | 5 refills | Status: DC

## 2021-09-02 NOTE — Progress Notes (Signed)
Patient: Terry [name] Date of Birth: Dec 26, 1950  Reason for Visit: Follow up stroke 08/07/21 History from: Patient, wife, daughter  Primary Neurologist: Saw Dr. Erlinda Hong  ASSESSMENT AND PLAN 71 y.o. year old male   1.  Stroke: Right MCA small patchy infarcts -Embolic, likely secondary to PAF not on anticoagulation at the time of CVA -Consultation with neurology, cardiology, decided to restart Xarelto; see # 2  2.  Paroxysmal atrial fibrillation -Recommend Watchman device given current stroke, waiting for referral appointment 09/10/21 with Dr. Quentin Ore  -On Xarelto since 08/08/21, no signs of bleeding  3.  Hypertension -At goal, < 130/90  4.  Hyperlipidemia -LDL 87, goal less than 70 -On Crestor 10 mg daily  5.  COVID infection -on Day # 8 at time of CVA, was symptom free  Terry [name] seems to be doing very well, is very pleasant, he will continue close follow-up with PCP for management of secondary stroke prevention, has upcoming appointment with cardiology for consideration of Watchman device.  I will see him back in 6 months to ensure no further needs.  HISTORY OF PRESENT ILLNESS: Today 09/03/21 Terry [name] is here today for stroke clinic follow-up for right frontal and occipital lobe infarcts 08/07/21, presented as code stroke with acute onset slurred speech, COVID-positive (day #8, symptoms had resolved), no tPA given due to mild sx, history of retroperitoneal hemorrhage.  He was previously on Eliquis for PAF, in June 2022 he had acute severe retroperitoneal hemorrhage, Eliquis was discontinued, not on any antithrombotic since.  Consultation with neurology, cardiology, patient and family, decided to discontinue aspirin and Plavix in the hospital, restart Xarelto 08/08/21.  Here today with his wife, daughter.  He is involved in speech therapy, improved, speech is 90% back to normal, has mild left-sided facial droop, slight dysarthria, but is largely clearly  understandable.  Reportedly slight numbness to his left fingertips.  When eating, sometimes drools to the left side.  Has consultation with cardiology next week for consideration of Watchman device.  He remains on Xarelto.  No signs of bleeding thus far.  Also on Crestor.  He enjoys physical activity, has been limiting his exercise since stroke.  He is being cautious. He very much enjoys spin classes.  He is retired.  For at least a year, he has had some mild memory issues with short term noted by his family. No new issues or concerns.  -CT head showed no acute abnormality -CT head and neck was unremarkable -MRI of the brain small acute and/or subacute infarcts in the right frontal lobe and right occipital lobe -2D echo EF 60 to 65% -LDL 87 -A1c 5.5 -Not on antithrombotic prior to admission  HISTORY  Copied Dr. Candiss Norse 08/07/21 HPI: Terry [name] is a 71 y.o. male with medical history significant of CAD, paroxysmal Afib no longer on anticoagulation due to history of spontaneous retroperitoneal bleed in June 2022, cardiomyopathy, history of central retinal venous occlusion, HTN, HLD presented to the ED as code stroke for evaluation of acute onset slurred speech, LKW at 7:15 pm on 08/07/2021.  Patient tested positive for COVID 8 days ago.  tPA not given due to mild symptoms and history of spontaneous retroperitoneal bleed.  CT head negative for acute finding.  CTA head and neck negative for LVO.  Brain MRI showing small acute and/or subacute infarcts in the right frontal lobe and right occipital lobe.  Labs significant for potassium 5.8, bicarb 20.  T. bili 1.9, remainder of LFTs normal.  A1c 5.5.  Repeat labs showing potassium 5.4, bicarb 26.  UA without signs of infection.  UDS negative.  SARS-CoV-2 PCR test positive.   History provided by patient and his wife at bedside.  Yesterday at dinnertime around 7 PM he had acute onset slurred speech with left-sided facial droop and mild left upper extremity  weakness.  He was having fevers and diarrhea, diagnosed with COVID 8 days ago but symptoms have now resolved.  Denies cough or shortness of breath.  Reports history of retroperitoneal bleed in June 2022 related to anticoagulation use for A-fib.  REVIEW OF SYSTEMS: Out of a complete 14 system review of symptoms, the patient complains only of the following symptoms, and all other reviewed systems are negative.  See HPI  ALLERGIES: Allergies  Allergen Reactions   Losartan Other (See Comments)    Makes BP drop very low   Penicillins Other (See Comments)    Childhood reaction Has patient had a PCN reaction causing immediate rash, facial/tongue/throat swelling, SOB or lightheadedness with hypotension: yes Has patient had a PCN reaction causing severe rash involving mucus membranes or skin necrosis: no Has patient had a PCN reaction that required hospitalization: no Has patient had a PCN reaction occurring within the last 10 years: no If all of the above answers are "NO", then may proceed with Cephalosporin use.    Penicillins     Other reaction(s): Unknown    HOME MEDICATIONS: Outpatient Medications Prior to Visit  Medication Sig Dispense Refill   acetaminophen (TYLENOL) 500 MG tablet Take 1,000 mg by mouth every 4 (four) hours as needed for moderate pain, headache or fever.     Cholecalciferol (VITAMIN D3 ULTRA STRENGTH) 125 MCG (5000 UT) capsule Take 5,000 Units by mouth daily.     dofetilide (TIKOSYN) 250 MCG capsule TAKE 1 CAPSULE BY MOUTH 2 TIMES DAILY. 180 capsule 3   fluticasone (FLONASE) 50 MCG/ACT nasal spray Place 2 sprays into both nostrils every evening.      guaiFENesin (MUCINEX) 600 MG 12 hr tablet Take 600 mg by mouth 2 (two) times daily as needed for cough.     magnesium oxide (MAG-OX) 400 (240 Mg) MG tablet TAKE 1 TABLET BY MOUTH EVERY DAY 120 tablet 2   magnesium oxide (MAG-OX) 400 (240 Mg) MG tablet Take 400 mg by mouth at bedtime.     metoprolol succinate (TOPROL-XL) 50  MG 24 hr tablet Take 1 tablet (50 mg total) by mouth daily. 90 tablet 3   Probiotic Product (PROBIOTIC DAILY PO) Take 1 capsule by mouth daily.     rivaroxaban (XARELTO) 20 MG TABS tablet Take 1 tablet (20 mg total) by mouth daily with supper. 30 tablet 5   rosuvastatin (CRESTOR) 10 MG tablet Take 1 tablet (10 mg total) by mouth at bedtime. 30 tablet 5   zinc gluconate 50 MG tablet Take 50 mg by mouth daily.     fluticasone (FLONASE) 50 MCG/ACT nasal spray Place 2 sprays into both nostrils at bedtime.     No facility-administered medications prior to visit.    PAST MEDICAL HISTORY: Past Medical History:  Diagnosis Date   Aortic root dilation ([place])    a. mild by CT 10/2017.   Basal cell carcinoma 10/02/2019   left hand bcc nod.   Cardiomyopathy [facility])    Central retinal vein occlusion    Chronic systolic CHF (congestive heart failure) [facility])    ED (erectile dysfunction)    H/O cardiac catheterization 2002 - normal cors  HTN (hypertension)    Hyperlipidemia    Hypokalemia    Leg edema    LV dysfunction    a. EF appeared mildly depressed by TEE 08/2015, no % given.   Mini stroke    a. ? Age 38 at time of central retinal vein occlusion - lost vision in eye   PAF (paroxysmal atrial fibrillation) ([place])    a. Dx 06/2015, s/p TEE/DCCV 08/2015.   Pericarditis 2010   Syncope    Hx of thought to be due to orthostatic hypotension   Visit for monitoring Tikosyn therapy 01/17/2018    PAST SURGICAL HISTORY: Past Surgical History:  Procedure Laterality Date   CARDIAC CATHETERIZATION  2002   CARDIOVERSION N/A 09/08/2015   Procedure: CARDIOVERSION;  Surgeon: Fay Records, MD;  Location: [facility];  Service: Cardiovascular;  Laterality: N/A;   CARDIOVERSION N/A 06/28/2017   Procedure: CARDIOVERSION;  Surgeon: Jerline Pain, MD;  Location: [facility] ENDOSCOPY;  Service: Cardiovascular;  Laterality: N/A;   HAND SURGERY  2003   KNEE SURGERY     NASAL SINUS SURGERY  2003   x2. Scar tissue build up  and had to recreate his tear duct   PERICARDIECTOMY     TEE WITHOUT CARDIOVERSION N/A 09/08/2015   Procedure: TRANSESOPHAGEAL ECHOCARDIOGRAM (TEE);  Surgeon: Fay Records, MD;  Location: [facility] ENDOSCOPY;  Service: Cardiovascular;  Laterality: N/A;   TEE WITHOUT CARDIOVERSION N/A 06/28/2017   Procedure: TRANSESOPHAGEAL ECHOCARDIOGRAM (TEE);  Surgeon: Jerline Pain, MD;  Location: [facility] ENDOSCOPY;  Service: Cardiovascular;  Laterality: N/A;    FAMILY HISTORY: Family History  Problem Relation Age of Onset   Hypertension Mother    Diabetes Mother    Cancer Mother    Atrial fibrillation Father    Heart disease Father        had pacemaker   Hypertension Father    Diabetes Father    Atrial fibrillation Brother    Heart attack Brother 67    SOCIAL HISTORY: Social History   Socioeconomic History   Marital status: Married    Spouse name: Monia Pouch   Number of children: Not on file   Years of education: Not on file   Highest education level: Not on file  Occupational History   Not on file  Tobacco Use   Smoking status: Never   Smokeless tobacco: Never  Vaping Use   Vaping Use: Never used  Substance and Sexual Activity   Alcohol use: Not Currently    Comment: 2-3 Times a Week Wine with Meals   Drug use: Never   Sexual activity: Not on file  Other Topics Concern   Not on file  Social History Narrative   ** Merged History Encounter **       Social Determinants of Health   Financial Resource Strain: Not on file  Food Insecurity: Not on file  Transportation Needs: Not on file  Physical Activity: Not on file  Stress: Not on file  Social Connections: Not on file  Intimate Partner Violence: Not on file    PHYSICAL EXAM  Vitals:   09/03/21 0913 09/03/21 0946  BP: (!) 148/88 130/90  Pulse: (!) 53   Weight: 185 lb (83.9 kg)   Height: 6' (1.829 m)    Body mass index is 25.09 kg/m.  Generalized: Well developed, in no acute distress  Cardiac: Regular Rhythm  Neurological  examination  Mentation: Alert oriented to time, place, history taking. Follows all commands, language is fluent, with speech very slight lisp,  but is largely clear and understandable, does have underlying accent Cranial nerve II-XII: Pupils were equal round reactive to light. Extraocular movements were full, visual field were full on confrontational test. Facial sensation and strength were normal. Head turning and shoulder shrug were normal and symmetric. Mild left facial droop. Motor: The motor testing reveals 5 over 5 strength of all 4 extremities. Good symmetric motor tone is noted throughout.  Sensory: Sensory testing is intact to soft touch on all 4 extremities. No evidence of extinction is noted.  Coordination: Cerebellar testing reveals good finger-nose-finger and heel-to-shin bilaterally.  Gait and station: Gait is normal. Tandem gait is normal. Romberg is negative. No drift is seen.  Reflexes: Deep tendon reflexes are symmetric and normal bilaterally.   DIAGNOSTIC DATA (LABS, IMAGING, TESTING) - I reviewed patient records, labs, notes, testing and imaging myself where available.  Lab Results  Component Value Date   WBC 4.9 08/09/2021   HGB 14.2 08/09/2021   HCT 42.8 08/09/2021   MCV 89.7 08/09/2021   PLT 186 08/09/2021      Component Value Date/Time   NA 142 08/09/2021 0141   NA 140 01/15/2021 0720   K 3.5 08/09/2021 0141   CL 108 08/09/2021 0141   CO2 26 08/09/2021 0141   GLUCOSE 102 (H) 08/09/2021 0141   BUN 10 08/09/2021 0141   BUN 14 01/15/2021 0720   CREATININE 1.08 08/09/2021 0141   CREATININE 1.01 01/30/2016 1606   CALCIUM 8.5 (L) 08/09/2021 0141   PROT 5.3 (L) 08/09/2021 0141   PROT 5.9 (L) 10/08/2019 0817   ALBUMIN 2.9 (L) 08/09/2021 0141   ALBUMIN 4.0 10/08/2019 0817   AST 15 08/09/2021 0141   ALT 16 08/09/2021 0141   ALKPHOS 41 08/09/2021 0141   BILITOT 0.7 08/09/2021 0141   BILITOT 1.0 10/08/2019 0817   GFRNONAA >60 08/09/2021 0141   GFRAA 89 05/02/2020  0726   Lab Results  Component Value Date   CHOL 144 08/08/2021   HDL 33 (L) 08/08/2021   LDLCALC 87 08/08/2021   TRIG 121 08/08/2021   CHOLHDL 4.4 08/08/2021   Lab Results  Component Value Date   HGBA1C 5.5 08/07/2021   No results found for: "VITAMINB12" Lab Results  Component Value Date   TSH 3.390 05/31/2017    Butler Denmark, AGNP-C, DNP 09/03/2021, 10:23 AM [facility] [address], [place], [place] [address] [phone]

## 2021-09-03 ENCOUNTER — Ambulatory Visit: Payer: PPO | Admitting: Neurology

## 2021-09-03 ENCOUNTER — Encounter: Payer: Self-pay | Admitting: Neurology

## 2021-09-03 VITALS — BP 130/90 | HR 53 | Ht 72.0 in | Wt 185.0 lb

## 2021-09-03 DIAGNOSIS — I480 Paroxysmal atrial fibrillation: Secondary | ICD-10-CM | POA: Diagnosis not present

## 2021-09-03 DIAGNOSIS — I63411 Cerebral infarction due to embolism of right middle cerebral artery: Secondary | ICD-10-CM

## 2021-09-03 NOTE — Patient Instructions (Signed)
Keep BP < 130/90, LDL < 70, A1C < 7.0 Keep appointment next week with Cardiology  Continue Xarelto, monitoring for any bleeding See you back in 6 months

## 2021-09-10 ENCOUNTER — Encounter: Payer: Self-pay | Admitting: *Deleted

## 2021-09-10 ENCOUNTER — Encounter: Payer: Self-pay | Admitting: Cardiology

## 2021-09-10 ENCOUNTER — Ambulatory Visit: Payer: PPO | Admitting: Cardiology

## 2021-09-10 VITALS — BP 122/78 | HR 69 | Ht 72.0 in | Wt 178.8 lb

## 2021-09-10 DIAGNOSIS — Z01818 Encounter for other preprocedural examination: Secondary | ICD-10-CM

## 2021-09-10 DIAGNOSIS — I4819 Other persistent atrial fibrillation: Secondary | ICD-10-CM | POA: Diagnosis not present

## 2021-09-10 DIAGNOSIS — Z79899 Other long term (current) drug therapy: Secondary | ICD-10-CM | POA: Diagnosis not present

## 2021-09-10 NOTE — Progress Notes (Signed)
Electrophysiology Office Note:    Date:  09/10/2021   ID:  Terry [name], DOB 22-Jan-1951, MRN [id number]  PCP:  Terry Cruel, MD  [facility] Cardiologist:  Terry Moores, MD  [facility] Electrophysiologist:  Terry Epley, MD   Referring MD: Terry Cruel, MD   Chief Complaint: New patient consult for Watchman  History of Present Illness:    Terry [name] is a 71 y.o. male who presents for an evaluation for Watchman device at the request of Terry Cruel, PA-C. Their medical history includes paroxysmal atrial fibrillation (dx 06/2015) s/p TEE/DCCV 08/2015, pericarditis, aortic root dilation, basal cell carcinoma, cardiomyopathy, central retinal vein occlusion, chronic systolic congestive heart failure, hypertension, hyperlipidemia, hypokalemia.  Previously he was admitted to the hospital 08/2020 with retroperitoneal bleed in the setting of anticoagulation with mass-effect on the right kidney. Eliquis was discontinued at that time.  On 08/07/2021 he presented to [facility] ED with left-sided facial droop, expressive aphasia, and upper extremity weakness. 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 Given history of paroxysmal atrial fibrillation he was resumed on anticoagulation with Xarelto.  He saw Terry Cruel, PA-C on 08/18/2021. Sinus bradycardia at rate of 54 bpm, left atrial enlargement, normal axis, no evidence of ischemia, normal QT interval. Given his history of retroperitoneal bleed on anticoagulation he was referred to EP for evaluation and consideration of Watchman device.  Today, he is accompanied by his wife and daughter. He remains compliant with tikosyn. He has been on Xarelto for a couple of weeks. He denies any bleeding issues, and no side effects that were different than when he was on Eliquis. However, he does complain of constantly feeling cold. They report that 09/23/21 marks 45 days of being on Xarelto.  He has been wearing his smart watch every  day since his prior retroperitoneal bleed. On 2 occasions it has detected an irregular rhythm, but it never lasted long.  Regarding his diet he has been conscientious of his sodium intake. He also successfully avoids alcohol.  For exercise he often lifts weights and uses a stair-climber.  They deny any chest [name], shortness of breath, or peripheral edema. No lightheadedness, headaches, syncope, orthopnea, or PND.  Also he continues to work with a Astronomer.    Past Medical History:  Diagnosis Date   Aortic root dilation ([place])    a. mild by CT 10/2017.   Basal cell carcinoma 10/02/2019   left hand bcc nod.   Cardiomyopathy [facility])    Central retinal vein occlusion    Chronic systolic CHF (congestive heart failure) (HCC)    ED (erectile dysfunction)    H/O cardiac catheterization 2002 - normal cors   HTN (hypertension)    Hyperlipidemia    Hypokalemia    Leg edema    LV dysfunction    a. EF appeared mildly depressed by TEE 08/2015, no % given.   Mini stroke    a. ? Age 83 at time of central retinal vein occlusion - lost vision in eye   PAF (paroxysmal atrial fibrillation) ([place])    a. Dx 06/2015, s/p TEE/DCCV 08/2015.   Pericarditis 2010   Syncope    Hx of thought to be due to orthostatic hypotension   Visit for monitoring Tikosyn therapy 01/17/2018    Past Surgical History:  Procedure Laterality Date   CARDIAC CATHETERIZATION  2002   CARDIOVERSION N/A 09/08/2015   Procedure: CARDIOVERSION;  Surgeon: Terry Records, MD;  Location: [facility];  Service: Cardiovascular;  Laterality: N/A;   CARDIOVERSION N/A 06/28/2017   Procedure: CARDIOVERSION;  Surgeon: Terry Pain, MD;  Location: [facility] ENDOSCOPY;  Service: Cardiovascular;  Laterality: N/A;   HAND SURGERY  2003   KNEE SURGERY     NASAL SINUS SURGERY  2003   x2. Scar tissue build up and had to recreate his tear duct   PERICARDIECTOMY     TEE WITHOUT CARDIOVERSION N/A 09/08/2015   Procedure: TRANSESOPHAGEAL ECHOCARDIOGRAM  (TEE);  Surgeon: Terry Records, MD;  Location[facility] ENDOSCOPY;  Service: Cardiovascular;  Laterality: N/A;   TEE WITHOUT CARDIOVERSION N/A 06/28/2017   Procedure: TRANSESOPHAGEAL ECHOCARDIOGRAM (TEE);  Surgeon: Terry Pain, MD;  Location: [facility] ENDOSCOPY;  Service: Cardiovascular;  Laterality: N/A;    Current Medications: Current Meds  Medication Sig   acetaminophen (TYLENOL) 500 MG tablet Take 1,000 mg by mouth every 4 (four) hours as needed for moderate [name], headache or fever.   Cholecalciferol (VITAMIN D3 ULTRA STRENGTH) 125 MCG (5000 UT) capsule Take 5,000 Units by mouth daily.   dofetilide (TIKOSYN) 250 MCG capsule TAKE 1 CAPSULE BY MOUTH 2 TIMES DAILY.   fluticasone (FLONASE) 50 MCG/ACT nasal spray Place 2 sprays into both nostrils every evening.    guaiFENesin (MUCINEX) 600 MG 12 hr tablet Take 600 mg by mouth 2 (two) times daily as needed for cough.   magnesium oxide (MAG-OX) 400 (240 Mg) MG tablet TAKE 1 TABLET BY MOUTH EVERY DAY   magnesium oxide (MAG-OX) 400 (240 Mg) MG tablet Take 400 mg by mouth at bedtime.   metoprolol succinate (TOPROL-XL) 50 MG 24 hr tablet Take 1 tablet (50 mg total) by mouth daily.   Probiotic Product (PROBIOTIC DAILY PO) Take 1 capsule by mouth daily.   rivaroxaban (XARELTO) 20 MG TABS tablet Take 1 tablet (20 mg total) by mouth daily with supper.   rosuvastatin (CRESTOR) 10 MG tablet Take 1 tablet (10 mg total) by mouth at bedtime.   zinc gluconate 50 MG tablet Take 50 mg by mouth daily.     Allergies:   Losartan, Penicillins, and Penicillins   Social History   Socioeconomic History   Marital status: Married    Spouse name: Terry [name]   Number of children: Not on file   Years of education: Not on file   Highest education level: Not on file  Occupational History   Not on file  Tobacco Use   Smoking status: Never   Smokeless tobacco: Never  Vaping Use   Vaping Use: Never used  Substance and Sexual Activity   Alcohol use: Not Currently    Comment:  2-3 Times a Week Wine with Meals   Drug use: Never   Sexual activity: Not on file  Other Topics Concern   Not on file  Social History Narrative   ** Merged History Encounter **       Social Determinants of Health   Financial Resource Strain: Not on file  Food Insecurity: Not on file  Transportation Needs: Not on file  Physical Activity: Not on file  Stress: Not on file  Social Connections: Not on file     Family History: The patient's family history includes Atrial fibrillation in his brother and father; Cancer in his mother; Diabetes in his father and mother; Heart attack (age of onset: 31) in his brother; Heart disease in his father; Hypertension in his father and mother.  ROS:   Please see the history of present illness.    (+) Chills All other systems reviewed and  are negative.  EKGs/Labs/Other Studies Reviewed:    The following studies were reviewed today:  08/08/2021  Echocardiogram:  1. Left ventricular ejection fraction, by estimation, is 60 to 65%. The  left ventricle has normal function. The left ventricle has no regional  wall motion abnormalities. Left ventricular diastolic parameters were  normal.   2. Right ventricular systolic function is normal. The right ventricular  size is normal. There is normal pulmonary artery systolic pressure.   3. Left atrial size was moderately dilated.   4. The mitral valve is grossly normal. Mild mitral valve regurgitation. No evidence of mitral stenosis.   5. The aortic valve is tricuspid. Aortic valve regurgitation is not  visualized. No aortic stenosis is present.   6. Aortic proximal ascending aorta within normal limits. There is  borderline dilatation of the aortic root, measuring 38 mm.   Comparison(s): No significant change when compared to prior echo dated 10/20/2020.   Conclusion(s)/Recommendation(s): No intracardiac source of embolism detected on this transthoracic study. Consider a transesophageal echocardiogram to  exclude cardiac source of embolism if clinically indicated. No left ventricular mural or apical  thrombus/thrombi.   08/07/2021  CTA Head/Neck: IMPRESSION: 1.  No intracranial large vessel occlusion or significant stenosis. 2.  No hemodynamically significant stenosis in the neck.  11/09/2017  Cardiac CTA: IMPRESSION: 1. Calcium score 5 isolated area in mid LAD this is 26 th percentile for age and sex   2.  No significant obstructive CAD right dominant coronary arteries   3.  Mild aortic root dilatation 4.0 cm    EKG:   EKG is personally reviewed.  09/10/2021: Sinus rhythm.  QTc is 477 ms.   Recent Labs: 08/09/2021: ALT 16; BUN 10; Creatinine, Ser 1.08; Hemoglobin 14.2; Magnesium 1.7; Platelets 186; Potassium 3.5; Sodium 142   Recent Lipid Panel    Component Value Date/Time   CHOL 144 08/08/2021 0558   CHOL 155 10/08/2019 0817   TRIG 121 08/08/2021 0558   HDL 33 (L) 08/08/2021 0558   HDL 36 (L) 10/08/2019 0817   CHOLHDL 4.4 08/08/2021 0558   VLDL 24 08/08/2021 0558   LDLCALC 87 08/08/2021 0558   LDLCALC 95 10/08/2019 0817    Physical Exam:    VS:  BP 122/78   Pulse 69   Ht 6' (1.829 m)   Wt 178 lb 12.8 oz (81.1 kg)   SpO2 97%   BMI 24.25 kg/m     Wt Readings from Last 3 Encounters:  09/10/21 178 lb 12.8 oz (81.1 kg)  09/03/21 185 lb (83.9 kg)  08/18/21 178 lb 12.8 oz (81.1 kg)     GEN: Well nourished, well developed in no acute distress HEENT: Normal NECK: No JVD; No carotid bruits LYMPHATICS: No lymphadenopathy CARDIAC: RRR, no murmurs, rubs, gallops RESPIRATORY:  Clear to auscultation without rales, wheezing or rhonchi  ABDOMEN: Soft, non-tender, non-distended MUSCULOSKELETAL:  No edema; No deformity  SKIN: Warm and dry NEUROLOGIC:  Alert and oriented x 3 PSYCHIATRIC:  Normal affect       ASSESSMENT:    1. Persistent atrial fibrillation ([place])   2. Pre-op evaluation   3. Encounter for long-term (current) use of high-risk medication    PLAN:     In order of problems listed above:  #Persistent atrial fibrillation His A-fib is relatively well controlled on Tikosyn.  QTc is acceptable for continued Tikosyn use.  He is currently taking Xarelto for stroke prophylaxis but desires a stroke risk mitigation strategy that avoids long-term exposure to  anticoagulation.  We discussed left atrial appendage occlusion as a mechanism to achieve this goal.  I discussed the Watchman procedure in detail during today's visit including the risks, recovery and likelihood of success.  I discussed the need for short-term anticoagulation.  He wishes to proceed with scheduling.  I have seen Jarrette Dehner in the office today who is being considered for a Watchman left atrial appendage closure device. I believe they will benefit from this procedure given their history of atrial fibrillation, CHA2DS2-VASc score of 6 and unadjusted ischemic stroke rate of 9.7% per year. Unfortunately, the patient is not felt to be a long term anticoagulation candidate secondary to history of large RP bleed. The patient's chart has been reviewed and I feel that they would be a candidate for short term oral anticoagulation after Watchman implant.   It is my belief that after undergoing a LAA closure procedure, Phinehas Grounds will not need long term anticoagulation which eliminates anticoagulation side effects and major bleeding risk.   Procedural risks for the Watchman implant have been reviewed with the patient including a 0.5% risk of stroke, <1% risk of perforation and <1% risk of device embolization. Other risks include bleeding, vascular damage, tamponade, worsening renal function, and death. The patient understands these risk and wishes to proceed.     The published clinical data on the safety and effectiveness of WATCHMAN include but are not limited to the following: - Holmes DR, Mechele Claude, Sick P et al. for the PROTECT AF Investigators. Percutaneous closure of the left atrial  appendage versus warfarin therapy for prevention of stroke in patients with atrial fibrillation: a randomised non-inferiority trial. Lancet 2009; 374: 534-42. Mechele Claude, Doshi SK, Abelardo Diesel D et al. on behalf of the PROTECT AF Investigators. Percutaneous Left Atrial Appendage Closure for Stroke Prophylaxis in Patients With Atrial Fibrillation 2.3-Year Follow-up of the PROTECT AF (Watchman Left Atrial Appendage System for Embolic Protection in Patients With Atrial Fibrillation) Trial. Circulation 2013; 127:720-729. - Alli O, Doshi S,  Kar S, Reddy VY, Sievert H et al. Quality of Life Assessment in the Randomized PROTECT AF (Percutaneous Closure of the Left Atrial Appendage Versus Warfarin Therapy for Prevention of Stroke in Patients With Atrial Fibrillation) Trial of Patients at Risk for Stroke With Nonvalvular Atrial Fibrillation. J Am Coll Cardiol 2013; 42:6834-1. Vertell Limber DR, Tarri Abernethy, Price M, Stone, Sievert H, Doshi S, Huber K, Reddy V. Prospective randomized evaluation of the Watchman left atrial appendage Device in patients with atrial fibrillation versus long-term warfarin therapy; the PREVAIL trial. Journal of the [employer] of Cardiology, Vol. 4, No. 1, 2014, 1-11. - Kar S, Doshi SK, Sadhu A, Horton R, Osorio J et al. Primary outcome evaluation of a next-generation left atrial appendage closure device: results from the PINNACLE FLX trial. Circulation 2021;143(18)1754-1762.    After today's visit with the patient which was dedicated solely for shared decision making visit regarding LAA closure device, the patient decided to proceed with the LAA appendage closure procedure scheduled to be done in the near future at [facility]. Prior to the procedure, I would like to obtain a gated CT scan of the chest with contrast timed for PV/LA visualization.    HAS-BLED score 4 Hypertension Yes  Abnormal renal and liver function (Dialysis, transplant, Cr >2.26 mg/dL /Cirrhosis or  Bilirubin >2x Normal or AST/ALT/AP >3x Normal) No  Stroke Yes  Bleeding Yes  Labile INR (Unstable/high INR) No  Elderly (>65) Yes  Drugs or  alcohol (? 8 drinks/week, anti-plt or NSAID) No   CHA2DS2-VASc Score = 6  The patient's score is based upon: CHF History: 1 HTN History: 1 Diabetes History: 0 Stroke History: 2 Vascular Disease History: 1 Age Score: 1 Gender Score: 0   Medication Adjustments/Labs and Tests Ordered: Current medicines are reviewed at length with the patient today.  Concerns regarding medicines are outlined above.   Orders Placed This Encounter  Procedures   CT CARDIAC MORPH/PULM VEIN W/CM&W/O CA SCORE   Basic Metabolic Panel (BMET)   EKG 12-Lead   No orders of the defined types were placed in this encounter.  I,Mathew Stumpf,acting as a Education administrator for Terry Epley, MD.,have documented all relevant documentation on the behalf of Terry Epley, MD,as directed by  Terry Epley, MD while in the presence of Terry Epley, MD.  I, Terry Epley, MD, have reviewed all documentation for this visit. The documentation on 09/10/21 for the exam, diagnosis, procedures, and orders are all accurate and complete.   Signed, Hilton Cork. Quentin Ore, MD, [facility], [facility] 09/10/2021 9:51 PM    Electrophysiology [facility]

## 2021-09-10 NOTE — Patient Instructions (Addendum)
Medication Instructions:  Your physician recommends that you continue on your current medications as directed. Please refer to the Current Medication list given to you today. *If you need a refill on your cardiac medications before your next appointment, please call your pharmacy*  Lab Work: [facility] 6/29 If you have labs (blood work) drawn today and your tests are completely normal, you will receive your results only by: [place] (if you have MyChart) OR A paper copy in the mail If you have any lab test that is abnormal or we need to change your treatment, we will call you to review the results.  Testing/Procedures: Your physician has requested that you have cardiac CT. Cardiac computed tomography (CT) is a painless test that uses an x-ray machine to take clear, detailed pictures of your heart. For further information please visit [email or web address]. Please follow instruction sheet as given.   Follow-Up: At [facility], you and your health needs are our priority.  As part of our continuing mission to provide you with exceptional heart care, we have created designated Provider Care Teams.  These Care Teams include your primary Cardiologist (physician) and Advanced Practice Providers (APPs -  Physician Assistants and Nurse Practitioners) who all work together to provide you with the care you need, when you need it.  Your physician wants you to follow-up in: Lenice Llamas, the [facility] Nurse Navigator, will call you after your CT once the [facility] Team has reviewed your imaging for an update on proceedings. [place]'s direct number is [phone] if you need assistance.   We recommend signing up for the patient portal called "MyChart".  Sign up information is provided on this After Visit Summary.  MyChart is used to connect with patients for Virtual Visits (Telemedicine).  Patients are able to view lab/test results, encounter notes, upcoming appointments, etc.  Non-urgent messages can be sent to  your provider as well.   To learn more about what you can do with MyChart, go to [email or web address].    Any Other Special Instructions Will Be Listed Below (If Applicable).  Left Atrial Appendage Closure Device Implantation  Left atrial appendage (LAA) closure device implantation is a procedure to put a small device in the LAA of the heart. The LAA is a small sac in the wall of the heart's left upper chamber. Blood clots can form in the LAA in people with atrial fibrillation (AFib). The device closes the LAA to help prevent a blood clot and stroke. AFib is a type of irregular or rapid heartbeat (arrhythmia). There is an increased risk of blood clots and stroke with AFib. This procedure helps to reduce that risk. Tell a health care provider about: Any allergies you have. All medicines you are taking, including vitamins, herbs, eye drops, creams, and over-the-counter medicines. Any problems you or family members have had with anesthetic medicines. Any blood disorders you have. Any surgeries you have had. Any medical conditions you have. Whether you are pregnant or may be pregnant. What are the risks? Generally, this is a safe procedure. However, problems may occur, including: Infection. Bleeding. Allergic reactions to medicines or dyes. Damage to nearby structures or organs. Heart attack. Stroke. Blood clots. Changes in heart rhythm. Device failure. What happens before the procedure? Staying hydrated Follow instructions from your health care provider about hydration, which may include: Up to 2 hours before the procedure - you may continue to drink clear liquids, such as water, clear fruit juice, black coffee, and plain tea. Eating and drinking restrictions Follow instructions  from your health care provider about eating and drinking, which may include: 8 hours before the procedure - stop eating heavy meals or foods, such as meat, fried foods, or fatty foods. 6 hours before the  procedure - stop eating light meals or foods, such as toast or cereal. 6 hours before the procedure - stop drinking milk or drinks that contain milk. 2 hours before the procedure - stop drinking clear liquids. Medicines Ask your health care provider about: Changing or stopping your regular medicines. This is especially important if you are taking diabetes medicines or blood thinners. Taking medicines such as aspirin and ibuprofen. These medicines can thin your blood. Do not take these medicines unless your health care provider tells you to take them. Taking over-the-counter medicines, vitamins, herbs, and supplements. Tests You may have blood tests and a physical exam. You may have an electrocardiogram (ECG). This test checks your heart's electrical patterns and rhythms. General instructions Do not use any products that contain nicotine or tobacco. These include cigarettes, chewing tobacco, and vaping devices, such as e-cigarettes. If you need help quitting, ask your health care provider. Ask your health care provider what steps will be taken to help prevent infection. These steps may include: Removing hair at the surgery site. Washing skin with a germ-killing soap. Taking antibiotic medicine. Plan to have a responsible adult take you home from the hospital or clinic. Plan to have a responsible adult care for you for the time you are told after you leave the hospital or clinic. This is important. What happens during the procedure? An IV will be inserted into one of your veins. You will be given one or more of the following: A medicine to help you relax (sedative). A medicine to make you fall asleep (general anesthetic). A small incision will be made in your groin area. A small wire will be put through the incision and into a blood vessel. Dye may be injected so X-rays can be used to guide the wire through the blood vessel. A long, thin tube (catheter) will be put over the small wire and  moved up through the blood vessel to reach your heart. The closure device will be moved through the catheter until it reaches your heart. A small hole will be made in the septum (transseptal puncture). The septum is a thin tissue that separates the upper two chambers of the heart. The device will be placed so that it closes the LAA. X-rays will be done to make sure the device is in the right place. The catheter and wire will be removed. The closure device will remain in your heart. After pressure is applied over the catheter site to prevent bleeding, a bandage (dressing) will be placed over the site where the catheter was inserted. The procedure may vary among health care providers and hospitals. What happens after the procedure? Your blood pressure, heart rate, breathing rate, and blood oxygen level will be monitored until you leave the hospital or clinic. You may have to wear compression stockings. These stockings help to prevent blood clots and reduce swelling in your legs. If you were given a sedative during the procedure, it can affect you for several hours. Do not drive or operate machinery until your health care provider says it is safe. You may be given pain medicine. You may need to drink more fluids to wash (flush) the dye out of your body. Drink enough fluid to keep your urine pale yellow. Take over-the-counter and prescription medicines only  as told by your health care provider. This is especially important if you were given blood thinners. Summary Left atrial appendage (LAA) closure device implantation is a procedure that is done to put a small device in the LAA of the heart. The LAA is a small sac in the wall of the heart's left upper chamber. The device closes the LAA to prevent stroke and other problems. Follow instructions from your health care provider before and after the procedure. This information is not intended to replace advice given to you by your health care provider. Make  sure you discuss any questions you have with your health care provider. Document Revised: 11/08/2019 Document Reviewed: 11/08/2019 Elsevier Patient Education  [place].

## 2021-09-11 ENCOUNTER — Other Ambulatory Visit: Payer: PPO

## 2021-09-11 ENCOUNTER — Telehealth: Payer: Self-pay | Admitting: Cardiology

## 2021-09-11 DIAGNOSIS — I4819 Other persistent atrial fibrillation: Secondary | ICD-10-CM

## 2021-09-11 DIAGNOSIS — Z01818 Encounter for other preprocedural examination: Secondary | ICD-10-CM | POA: Diagnosis not present

## 2021-09-11 NOTE — Telephone Encounter (Signed)
Answered all questions about watchman evaluation.

## 2021-09-11 NOTE — Telephone Encounter (Signed)
Patient called and asked to speak to Dr. Mardene Speak RN Otila Kluver. He had some questions following his appointment he had yesterday

## 2021-09-12 LAB — BASIC METABOLIC PANEL
BUN/Creatinine Ratio: 17 (ref 10–24)
BUN: 17 mg/dL (ref 8–27)
CO2: 27 mmol/L (ref 20–29)
Calcium: 9.4 mg/dL (ref 8.6–10.2)
Chloride: 103 mmol/L (ref 96–106)
Creatinine, Ser: 1.03 mg/dL (ref 0.76–1.27)
Glucose: 88 mg/dL (ref 70–99)
Potassium: 4.1 mmol/L (ref 3.5–5.2)
Sodium: 143 mmol/L (ref 134–144)
eGFR: 79 mL/min/{1.73_m2} (ref >59)

## 2021-09-16 DIAGNOSIS — H524 Presbyopia: Secondary | ICD-10-CM | POA: Diagnosis not present

## 2021-09-23 DIAGNOSIS — I2510 Atherosclerotic heart disease of native coronary artery without angina pectoris: Secondary | ICD-10-CM | POA: Diagnosis not present

## 2021-09-23 DIAGNOSIS — E785 Hyperlipidemia, unspecified: Secondary | ICD-10-CM | POA: Diagnosis not present

## 2021-09-23 DIAGNOSIS — I480 Paroxysmal atrial fibrillation: Secondary | ICD-10-CM | POA: Diagnosis not present

## 2021-09-23 DIAGNOSIS — H348192 Central retinal vein occlusion, unspecified eye, stable: Secondary | ICD-10-CM | POA: Diagnosis not present

## 2021-09-23 DIAGNOSIS — I10 Essential (primary) hypertension: Secondary | ICD-10-CM | POA: Diagnosis not present

## 2021-09-23 DIAGNOSIS — Z7901 Long term (current) use of anticoagulants: Secondary | ICD-10-CM | POA: Diagnosis not present

## 2021-09-23 DIAGNOSIS — I429 Cardiomyopathy, unspecified: Secondary | ICD-10-CM | POA: Diagnosis not present

## 2021-09-23 DIAGNOSIS — K661 Hemoperitoneum: Secondary | ICD-10-CM | POA: Diagnosis not present

## 2021-09-23 DIAGNOSIS — I69322 Dysarthria following cerebral infarction: Secondary | ICD-10-CM | POA: Diagnosis not present

## 2021-10-02 ENCOUNTER — Telehealth (HOSPITAL_COMMUNITY): Payer: Self-pay | Admitting: *Deleted

## 2021-10-02 NOTE — Telephone Encounter (Signed)
Patient returning call regarding upcoming cardiac imaging study; pt verbalizes understanding of appt date/time, parking situation and where to check in, pre-test NPO status and verified current allergies; name and call back number provided for further questions should they arise  Terry Clement RN Navigator Cardiac [place] [facility] V[facility] [phone] office [phone] cell  He is aware to arrive at 11:30am.

## 2021-10-02 NOTE — Telephone Encounter (Signed)
Attempted to call patient regarding upcoming cardiac CT appointment. °Left message on voicemail with name and callback number ° °Welcome Fults RN Navigator Cardiac Imaging °[facility] °[phone] Office °[phone] Cell ° °

## 2021-10-05 ENCOUNTER — Ambulatory Visit (HOSPITAL_COMMUNITY)
Admission: RE | Admit: 2021-10-05 | Discharge: 2021-10-05 | Disposition: A | Discharge status: Home / Self Care | Payer: PPO | Source: Ambulatory Visit | Attending: Cardiology | Admitting: Cardiology

## 2021-10-05 DIAGNOSIS — I4819 Other persistent atrial fibrillation: Secondary | ICD-10-CM | POA: Insufficient documentation

## 2021-10-05 DIAGNOSIS — Z01818 Encounter for other preprocedural examination: Secondary | ICD-10-CM | POA: Insufficient documentation

## 2021-10-05 MED ORDER — IOHEXOL 350 MG/ML SOLN
100.0000 mL | Freq: Once | INTRAVENOUS | Status: AC | PRN
  Administered 2021-10-05: 100 mL via INTRAVENOUS

## 2021-10-07 ENCOUNTER — Telehealth: Payer: Self-pay | Admitting: Cardiology

## 2021-10-07 NOTE — Telephone Encounter (Signed)
Spoke with patient about pre-Watchman imaging with plans to proceed with implant per Dr. Claudie Revering review. Patient aware that he will receive a call next week for more finalized date, likely 12/2021. Imaging uploaded to [employer].    Kathyrn Drown NP-C Structural Heart Team  Pager: [phone] Phone: [phone]

## 2021-10-12 ENCOUNTER — Telehealth: Payer: Self-pay

## 2021-10-12 ENCOUNTER — Other Ambulatory Visit: Payer: Self-pay

## 2021-10-12 DIAGNOSIS — K661 Hemoperitoneum: Secondary | ICD-10-CM

## 2021-10-12 DIAGNOSIS — I4819 Other persistent atrial fibrillation: Secondary | ICD-10-CM

## 2021-10-12 MED ORDER — SODIUM CHLORIDE 0.9 % IV SOLN: INTRAVENOUS | Status: DC

## 2021-10-12 NOTE — Telephone Encounter (Signed)
-----   Message from Vickie Epley, MD sent at 10/07/2021  7:54 AM EDT ----- OK to proceed with watchman evaluation.  Lysbeth Galas T. Quentin Ore, MD, [facility], [facility] Cardiac Electrophysiology

## 2021-10-12 NOTE — Telephone Encounter (Signed)
Reviewed results with patient who verbalized understanding.   The patient wishes to proceed with LAAO on 10/29/21.  Confirmed he has not missed any doses of his Xarelto. Scheduled the patient 8/9 for pre-procedure visit. He was grateful for call and agrees with plan.

## 2021-10-14 NOTE — Progress Notes (Signed)
[facility]                                     Cardiology Office Note:    Date:  10/22/2021   ID:  Terry [name], DOB [birth year], MRN [id number]  PCP:  Lawerance Cruel, MD  [facility] Cardiologist:  Mertie Moores, MD  [facility] Electrophysiologist:  Vickie Epley, MD   Referring MD: Lawerance Cruel, MD   Chief Complaint  Patient presents with   Follow-up    Pre Watchman    History of Present Illness:    Terry [name] is a 71 y.o. male with a hx of paroxysmal atrial fibrillation (dx 06/2015) s/p TEE/DCCV 08/2015, pericarditis, aortic root dilation, basal cell carcinoma, cardiomyopathy, central retinal vein occlusion, chronic systolic congestive heart failure, hypertension, hyperlipidemia, and hypokalemia.  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  At that time Eliquis was discontinued given relatively low CHA2DS2-VASc score and bleeding. He is followed with Dr. Irven Shelling team was was seen in follow up 01/21/2021 at which time he was stable from a cardiovascular standpoint and advised to follow-up in 1 year.  He unfortunately presented once again to [facility] with left-sided facial droop and expressive aphasia as well as weakness of the upper extremities on 08/07/2021. Imaging revealed right MCA infarct. Shared decision with neurology was to resume anticoagulation and stop antiplatelets given history of paroxysmal atrial fibrillation. He was seen in follow up by Lawerance Cruel, PA at which time he was noted to be doing well with no new neuro changes.   He was referred to EP for consideration of Watchman implant and was seen by Dr. Quentin Ore 09/10/21. He was reportedly compliant Tikosyn and had been tolerating Xarelto after restarting as outlined above. He underwent pre LAAO CT imaging that showed anatomy suitable for implant.   He is being seen today prior to scheduled Watchman  procedure 10/29/21. He has been tolerating Xarelto with no bleeding issues. He is here today with his wife. We reviewed pre Watchman instructions in depth with all questions answered. He denies chest pain, SOB, palpitations, LE edema, orthopnea, dizziness, or syncope. He reports compliance withTikosyn and Xarelto.   Past Medical History:  Diagnosis Date   Aortic root dilation ([place])    a. mild by CT 10/2017.   Basal cell carcinoma 10/02/2019   left hand bcc nod.   Cardiomyopathy [facility])    Central retinal vein occlusion    Chronic systolic CHF (congestive heart failure) (H[facility])    ED (erectile dysfunction)    H/O cardiac catheterization 2002 - normal cors   HTN (hypertension)    Hyperlipidemia    Hypokalemia    Leg edema    LV dysfunction    a. EF appeared mildly depressed by TEE 08/2015, no % given.   Mini stroke    a. ? Age 65 at time of central retinal vein occlusion - lost vision in eye   PAF (paroxysmal atrial fibrillation) ([place])    a. Dx 06/2015, s/p TEE/DCCV 08/2015.   Pericarditis 2010   Syncope    Hx of thought to be due to orthostatic hypotension   Visit for monitoring Tikosyn therapy 01/17/2018    Past Surgical History:  Procedure Laterality Date   CARDIAC CATHETERIZATION  2002   CARDIOVERSION N/A 09/08/2015   Procedure: CARDIOVERSION;  Surgeon: Carmin Muskrat  Harrington Challenger, MD;  Location: [place];  Service: Cardiovascular;  Laterality: N/A;   CARDIOVERSION N/A 06/28/2017   Procedure: CARDIOVERSION;  Surgeon: Jerline Pain, MD;  Location[facility] ENDOSCOPY;  Service: Cardiovascular;  Laterality: N/A;   HAND SURGERY  2003   KNEE SURGERY     NASAL SINUS SURGERY  2003   x2. Scar tissue build up and had to recreate his tear duct   PERICARDIECTOMY     TEE WITHOUT CARDIOVERSION N/A 09/08/2015   Procedure: TRANSESOPHAGEAL ECHOCARDIOGRAM (TEE);  Surgeon: Fay Records, MD;  Location: [facility] ENDOSCOPY;  Service: Cardiovascular;  Laterality: N/A;   TEE WITHOUT CARDIOVERSION N/A 06/28/2017   Procedure:  TRANSESOPHAGEAL ECHOCARDIOGRAM (TEE);  Surgeon: Jerline Pain, MD;  Location: [facility] ENDOSCOPY;  Service: Cardiovascular;  Laterality: N/A;    Current Medications: Current Meds  Medication Sig   acetaminophen (TYLENOL) 500 MG tablet Take 1,000 mg by mouth every 4 (four) hours as needed for moderate pain, headache or fever.   Cholecalciferol (VITAMIN D3 ULTRA STRENGTH) 125 MCG (5000 UT) capsule Take 5,000 Units by mouth daily.   dofetilide (TIKOSYN) 250 MCG capsule TAKE 1 CAPSULE BY MOUTH 2 TIMES DAILY.   fluticasone (FLONASE) 50 MCG/ACT nasal spray Place 2 sprays into both nostrils every evening.    guaiFENesin (MUCINEX) 600 MG 12 hr tablet Take 600 mg by mouth 2 (two) times daily as needed for cough.   magnesium oxide (MAG-OX) 400 (240 Mg) MG tablet TAKE 1 TABLET BY MOUTH EVERY DAY   magnesium oxide (MAG-OX) 400 (240 Mg) MG tablet Take 400 mg by mouth at bedtime.   metoprolol succinate (TOPROL-XL) 50 MG 24 hr tablet Take 1 tablet (50 mg total) by mouth daily.   Probiotic Product (PROBIOTIC DAILY PO) Take 1 capsule by mouth daily.   rivaroxaban (XARELTO) 20 MG TABS tablet Take 1 tablet (20 mg total) by mouth daily with supper.   rosuvastatin (CRESTOR) 10 MG tablet Take 1 tablet (10 mg total) by mouth at bedtime.   zinc gluconate 50 MG tablet Take 50 mg by mouth daily.   Current Facility-Administered Medications for the 10/21/21 encounter (Office Visit) with [facility]  Medication   0.9 %  sodium chloride infusion   0.9 %  sodium chloride infusion     Allergies:   Losartan, Penicillins, and Penicillins   Social History   Socioeconomic History   Marital status: Married    Spouse name: Monia Pouch   Number of children: Not on file   Years of education: Not on file   Highest education level: Not on file  Occupational History   Not on file  Tobacco Use   Smoking status: Never   Smokeless tobacco: Never  Vaping Use   Vaping Use: Never used  Substance and Sexual  Activity   Alcohol use: Not Currently    Comment: 2-3 Times a Week Wine with Meals   Drug use: Never   Sexual activity: Not on file  Other Topics Concern   Not on file  Social History Narrative   ** Merged History Encounter **       Social Determinants of Health   Financial Resource Strain: Not on file  Food Insecurity: Not on file  Transportation Needs: Not on file  Physical Activity: Not on file  Stress: Not on file  Social Connections: Not on file     Family History: The patient's family history includes Atrial fibrillation in his brother and father; Cancer in his mother; Diabetes in his  father and mother; Heart attack (age of onset: 12) in his brother; Heart disease in his father; Hypertension in his father and mother.  ROS:   Please see the history of present illness.    All other systems reviewed and are negative.  EKGs/Labs/Other Studies Reviewed:    The following studies were reviewed today:  CTA cardiac 10/06/21:  1. The left atrial appendage is a large chicken wing morphology without thrombus. Accessory left atrial appendage noted in the superior LA.   2. A 27 mm device is recommended based on average diameter due to oval shaped LZ with 20% compression   3. There is no thrombus in the left atrial appendage.   4. A mid/mid IAS puncture site is recommended.  Small PFO noted.   5. Optimal deployment angle: RAO 16 CRA 19   6. Normal coronary origin. Right dominance. CAC score of 16.7, which is 26th percentile for age-, sex-, and race-matched controls.    EKG:  EKG is ordered today.  The ekg ordered today demonstrates SB with HR 55bpm.   Recent Labs: 08/09/2021: ALT 16; Magnesium 1.7 10/21/2021: BUN 22; Creatinine, Ser 1.11; Hemoglobin 15.2; Platelets 181; Potassium 3.9; Sodium 143  Recent Lipid Panel    Component Value Date/Time   CHOL 144 08/08/2021 0558   CHOL 155 10/08/2019 0817   TRIG 121 08/08/2021 0558   HDL 33 (L) 08/08/2021 0558   HDL 36 (L)  10/08/2019 0817   CHOLHDL 4.4 08/08/2021 0558   VLDL 24 08/08/2021 0558   LDLCALC 87 08/08/2021 0558   LDLCALC 95 10/08/2019 0817   Risk Assessment/Calculations:    HAS-BLED score 4 Hypertension Yes  Abnormal renal and liver function (Dialysis, transplant, Cr >2.26 mg/dL /Cirrhosis or Bilirubin >2x Normal or AST/ALT/AP >3x Normal) No  Stroke Yes  Bleeding Yes  Labile INR (Unstable/high INR) No  Elderly (>65) Yes  Drugs or alcohol (? 8 drinks/week, anti-plt or NSAID) No    CHA2DS2-VASc Score = 6  The patient's score is based upon: CHF History: 1 HTN History: 1 Diabetes History: 0 Stroke History: 2 Vascular Disease History: 1 Age Score: 1 Gender Score: 0  Physical Exam:    VS:  BP 122/70   Pulse (!) 55   Ht 6' (1.829 m)   Wt 179 lb 6.4 oz (81.4 kg)   SpO2 97%   BMI 24.33 kg/m     Wt Readings from Last 3 Encounters:  10/21/21 179 lb 6.4 oz (81.4 kg)  09/10/21 178 lb 12.8 oz (81.1 kg)  09/03/21 185 lb (83.9 kg)    General: Well developed, well nourished, NAD Neck: Negative for carotid bruits. No JVD Lungs:Clear to ausculation bilaterally. Breathing is unlabored. Cardiovascular: RRR with S1 S2. No murmurs Extremities: No edema.  Neuro: Alert and oriented. No focal deficits. No facial asymmetry. MAE spontaneously. Psych: Responds to questions appropriately with normal affect.    ASSESSMENT/PLAN:    Persistent atrial fibrillation: Referred to EP for consideration of Watchman implant due to hx of retroperitoneal bleed in the setting of anticoagulation. He underwent pre Watchman imaging which showed anatomy suitable for implant. Reports compliance with Tikosyn and Xarelto. Obtain BMET, CBC. Pre procedure instructions reviewed with all questions answered.   Hx of retroperitoneal bleeding on long term anticoagulation: Tolerating Xarelto however interested in Watchman implant for long term stroke prevention.   HTN: Stable with no changes needed at this time   HLD: Last  LDL, 87 on 08/08/21. Continue current regimen   Medication Adjustments/Labs and  Tests Ordered: Current medicines are reviewed at length with the patient today.  Concerns regarding medicines are outlined above.  Orders Placed This Encounter  Procedures   CBC   Basic metabolic panel   EKG 08-HNGI   No orders of the defined types were placed in this encounter.   Patient Instructions  Medication Instructions:  No changes *If you need a refill on your cardiac medications before your next appointment, please call your pharmacy*   Lab Work: Today: CBC, BMET  Testing/Procedures: Watchman (Left Atrial Appendage Device Implant) - as planned   Please review instruction letter provided today and call if you have any questions! Lenice Llamas,  [phone].    Follow-Up: Your post procedure visits will be arranged during your hospital stay   Signed, Kathyrn Drown, NP  10/22/2021 10:12 AM    [place]

## 2021-10-21 ENCOUNTER — Ambulatory Visit (INDEPENDENT_AMBULATORY_CARE_PROVIDER_SITE_OTHER): Payer: PPO | Admitting: Cardiology

## 2021-10-21 VITALS — BP 122/70 | HR 55 | Ht 72.0 in | Wt 179.4 lb

## 2021-10-21 DIAGNOSIS — I10 Essential (primary) hypertension: Secondary | ICD-10-CM | POA: Diagnosis not present

## 2021-10-21 DIAGNOSIS — I63411 Cerebral infarction due to embolism of right middle cerebral artery: Secondary | ICD-10-CM | POA: Diagnosis not present

## 2021-10-21 DIAGNOSIS — Z01812 Encounter for preprocedural laboratory examination: Secondary | ICD-10-CM

## 2021-10-21 DIAGNOSIS — Z01818 Encounter for other preprocedural examination: Secondary | ICD-10-CM

## 2021-10-21 DIAGNOSIS — I4819 Other persistent atrial fibrillation: Secondary | ICD-10-CM

## 2021-10-21 DIAGNOSIS — E7800 Pure hypercholesterolemia, unspecified: Secondary | ICD-10-CM

## 2021-10-21 LAB — BASIC METABOLIC PANEL
BUN/Creatinine Ratio: 20 (ref 10–24)
BUN: 22 mg/dL (ref 8–27)
CO2: 27 mmol/L (ref 20–29)
Calcium: 9.6 mg/dL (ref 8.6–10.2)
Chloride: 107 mmol/L — ABNORMAL HIGH (ref 96–106)
Creatinine, Ser: 1.11 mg/dL (ref 0.76–1.27)
Glucose: 88 mg/dL (ref 70–99)
Potassium: 3.9 mmol/L (ref 3.5–5.2)
Sodium: 143 mmol/L (ref 134–144)
eGFR: 72 mL/min/{1.73_m2} (ref >59)

## 2021-10-21 LAB — CBC
Hematocrit: 45.7 % (ref 37.5–51.0)
Hemoglobin: 15.2 g/dL (ref 13.0–17.7)
MCH: 29.8 pg (ref 26.6–33.0)
MCHC: 33.3 g/dL (ref 31.5–35.7)
MCV: 90 fL (ref 79–97)
Platelets: 181 10*3/uL (ref 150–450)
RBC: 5.1 x10E6/uL (ref 4.14–5.80)
RDW: 14.8 % (ref 11.6–15.4)
WBC: 4.1 10*3/uL (ref 3.4–10.8)

## 2021-10-21 NOTE — Patient Instructions (Signed)
Medication Instructions:  No changes *If you need a refill on your cardiac medications before your next appointment, please call your pharmacy*   Lab Work: Today: CBC, BMET  Testing/Procedures: Watchman (Left Atrial Appendage Device Implant) - as planned   Please review instruction letter provided today and call if you have any questions! Lenice Llamas,  [phone].    Follow-Up: Your post procedure visits will be arranged during your hospital stay

## 2021-10-28 ENCOUNTER — Telehealth: Payer: Self-pay

## 2021-10-28 NOTE — Telephone Encounter (Signed)
Confirmed procedure date of 10/29/2021. Confirmed arrival time of 1100 for procedure time at 1330. Reviewed pre-procedure instructions with patient. He understands to take medications as directed today. He states he takes his Toprol at night. He will ONLY take Tikosyn in the morning prior to procedure. The patient understands to call if questions/concerns arise prior to procedure.

## 2021-10-29 ENCOUNTER — Inpatient Hospital Stay (HOSPITAL_COMMUNITY): Payer: PPO

## 2021-10-29 ENCOUNTER — Encounter (HOSPITAL_COMMUNITY)
Admission: RE | Disposition: A | Discharge status: Home / Self Care | Payer: Self-pay | Source: Home / Self Care | Attending: Cardiology

## 2021-10-29 ENCOUNTER — Inpatient Hospital Stay (HOSPITAL_COMMUNITY)
Admission: RE | Admit: 2021-10-29 | Discharge: 2021-10-30 | DRG: 274 | Disposition: A | Discharge status: Home / Self Care | Payer: PPO | Attending: Cardiology | Admitting: Cardiology

## 2021-10-29 ENCOUNTER — Other Ambulatory Visit: Payer: Self-pay

## 2021-10-29 ENCOUNTER — Encounter (HOSPITAL_COMMUNITY): Payer: Self-pay | Admitting: Cardiology

## 2021-10-29 ENCOUNTER — Inpatient Hospital Stay (HOSPITAL_COMMUNITY)
Admission: RE | Admit: 2021-10-29 | Discharge: 2021-10-29 | Disposition: A | Discharge status: Home / Self Care | Payer: PPO | Source: Ambulatory Visit | Attending: Cardiology | Admitting: Cardiology

## 2021-10-29 DIAGNOSIS — Z7901 Long term (current) use of anticoagulants: Secondary | ICD-10-CM

## 2021-10-29 DIAGNOSIS — I429 Cardiomyopathy, unspecified: Secondary | ICD-10-CM | POA: Diagnosis not present

## 2021-10-29 DIAGNOSIS — Z85828 Personal history of other malignant neoplasm of skin: Secondary | ICD-10-CM | POA: Diagnosis not present

## 2021-10-29 DIAGNOSIS — Z95818 Presence of other cardiac implants and grafts: Principal | ICD-10-CM

## 2021-10-29 DIAGNOSIS — Z006 Encounter for examination for normal comparison and control in clinical research program: Secondary | ICD-10-CM | POA: Diagnosis not present

## 2021-10-29 DIAGNOSIS — F419 Anxiety disorder, unspecified: Secondary | ICD-10-CM | POA: Diagnosis not present

## 2021-10-29 DIAGNOSIS — I341 Nonrheumatic mitral (valve) prolapse: Secondary | ICD-10-CM | POA: Diagnosis present

## 2021-10-29 DIAGNOSIS — Z8673 Personal history of transient ischemic attack (TIA), and cerebral infarction without residual deficits: Secondary | ICD-10-CM

## 2021-10-29 DIAGNOSIS — Z809 Family history of malignant neoplasm, unspecified: Secondary | ICD-10-CM

## 2021-10-29 DIAGNOSIS — I110 Hypertensive heart disease with heart failure: Secondary | ICD-10-CM

## 2021-10-29 DIAGNOSIS — I509 Heart failure, unspecified: Secondary | ICD-10-CM | POA: Diagnosis not present

## 2021-10-29 DIAGNOSIS — I10 Essential (primary) hypertension: Secondary | ICD-10-CM | POA: Diagnosis not present

## 2021-10-29 DIAGNOSIS — I77810 Thoracic aortic ectasia: Secondary | ICD-10-CM | POA: Diagnosis not present

## 2021-10-29 DIAGNOSIS — I088 Other rheumatic multiple valve diseases: Secondary | ICD-10-CM | POA: Diagnosis not present

## 2021-10-29 DIAGNOSIS — Z888 Allergy status to other drugs, medicaments and biological substances status: Secondary | ICD-10-CM

## 2021-10-29 DIAGNOSIS — I5022 Chronic systolic (congestive) heart failure: Secondary | ICD-10-CM | POA: Diagnosis not present

## 2021-10-29 DIAGNOSIS — I4819 Other persistent atrial fibrillation: Secondary | ICD-10-CM

## 2021-10-29 DIAGNOSIS — I4891 Unspecified atrial fibrillation: Secondary | ICD-10-CM | POA: Diagnosis present

## 2021-10-29 DIAGNOSIS — Z79899 Other long term (current) drug therapy: Secondary | ICD-10-CM | POA: Diagnosis not present

## 2021-10-29 DIAGNOSIS — E785 Hyperlipidemia, unspecified: Secondary | ICD-10-CM | POA: Diagnosis not present

## 2021-10-29 DIAGNOSIS — Z8249 Family history of ischemic heart disease and other diseases of the circulatory system: Secondary | ICD-10-CM | POA: Diagnosis not present

## 2021-10-29 DIAGNOSIS — I639 Cerebral infarction, unspecified: Secondary | ICD-10-CM | POA: Diagnosis present

## 2021-10-29 DIAGNOSIS — K661 Hemoperitoneum: Secondary | ICD-10-CM

## 2021-10-29 DIAGNOSIS — Z880 Allergy status to penicillin: Secondary | ICD-10-CM | POA: Diagnosis not present

## 2021-10-29 DIAGNOSIS — Z01818 Encounter for other preprocedural examination: Secondary | ICD-10-CM | POA: Diagnosis not present

## 2021-10-29 HISTORY — PX: LEFT ATRIAL APPENDAGE OCCLUSION: EP1229

## 2021-10-29 HISTORY — PX: TEE WITHOUT CARDIOVERSION: SHX5443

## 2021-10-29 HISTORY — DX: Presence of other cardiac implants and grafts: Z95.818

## 2021-10-29 LAB — SURGICAL PCR SCREEN
MRSA, PCR: NEGATIVE
Staphylococcus aureus: POSITIVE — AB

## 2021-10-29 LAB — POCT ACTIVATED CLOTTING TIME: Activated Clotting Time: 426 seconds

## 2021-10-29 LAB — TYPE AND SCREEN
ABO/RH(D): A POS
Antibody Screen: NEGATIVE

## 2021-10-29 SURGERY — LEFT ATRIAL APPENDAGE OCCLUSION
Anesthesia: General

## 2021-10-29 MED ORDER — SODIUM CHLORIDE 0.9 % IV SOLN: 250.0000 mL | INTRAVENOUS | Status: DC | PRN

## 2021-10-29 MED ORDER — IOHEXOL 350 MG/ML SOLN
INTRAVENOUS | Status: DC | PRN
  Administered 2021-10-29 (×2): 5 mL

## 2021-10-29 MED ORDER — CHLORHEXIDINE GLUCONATE 0.12 % MT SOLN
OROMUCOSAL | Status: AC
  Administered 2021-10-29: 15 mL via OROMUCOSAL
  Filled 2021-10-29: qty 15

## 2021-10-29 MED ORDER — HEPARIN SODIUM (PORCINE) 1000 UNIT/ML IJ SOLN
INTRAMUSCULAR | Status: DC | PRN
  Administered 2021-10-29: 12000 [IU] via INTRAVENOUS

## 2021-10-29 MED ORDER — ACETAMINOPHEN 325 MG PO TABS: 650.0000 mg | ORAL_TABLET | ORAL | Status: DC | PRN

## 2021-10-29 MED ORDER — FENTANYL CITRATE (PF) 100 MCG/2ML IJ SOLN
INTRAMUSCULAR | Status: AC
  Filled 2021-10-29: qty 2

## 2021-10-29 MED ORDER — ROCURONIUM BROMIDE 10 MG/ML (PF) SYRINGE
PREFILLED_SYRINGE | INTRAVENOUS | Status: DC | PRN
  Administered 2021-10-29 (×2): 50 mg via INTRAVENOUS

## 2021-10-29 MED ORDER — METOPROLOL SUCCINATE ER 50 MG PO TB24
50.0000 mg | ORAL_TABLET | Freq: Every day | ORAL | Status: DC
  Administered 2021-10-29: 50 mg via ORAL
  Filled 2021-10-29: qty 1

## 2021-10-29 MED ORDER — DEXAMETHASONE SODIUM PHOSPHATE 10 MG/ML IJ SOLN
INTRAMUSCULAR | Status: DC | PRN
  Administered 2021-10-29: 8 mg via INTRAVENOUS

## 2021-10-29 MED ORDER — ONDANSETRON HCL 4 MG/2ML IJ SOLN
INTRAMUSCULAR | Status: DC | PRN
  Administered 2021-10-29: 4 mg via INTRAVENOUS

## 2021-10-29 MED ORDER — PROPOFOL 10 MG/ML IV BOLUS
INTRAVENOUS | Status: DC | PRN
  Administered 2021-10-29: 50 mg via INTRAVENOUS
  Administered 2021-10-29: 100 mg via INTRAVENOUS
  Administered 2021-10-29: 50 mg via INTRAVENOUS

## 2021-10-29 MED ORDER — FENTANYL CITRATE (PF) 100 MCG/2ML IJ SOLN
INTRAMUSCULAR | Status: DC | PRN
  Administered 2021-10-29: 100 ug via INTRAVENOUS

## 2021-10-29 MED ORDER — SODIUM CHLORIDE 0.9% FLUSH: 3.0000 mL | INTRAVENOUS | Status: DC | PRN

## 2021-10-29 MED ORDER — PHENYLEPHRINE HCL-NACL 20-0.9 MG/250ML-% IV SOLN
INTRAVENOUS | Status: DC | PRN
  Administered 2021-10-29: 30 ug/min via INTRAVENOUS

## 2021-10-29 MED ORDER — PROTAMINE SULFATE 10 MG/ML IV SOLN
INTRAVENOUS | Status: DC | PRN
  Administered 2021-10-29: 35 mg via INTRAVENOUS

## 2021-10-29 MED ORDER — MAGNESIUM OXIDE -MG SUPPLEMENT 400 (240 MG) MG PO TABS
400.0000 mg | ORAL_TABLET | Freq: Every day | ORAL | Status: DC
Start: 2021-10-29 — End: 2021-10-30
  Administered 2021-10-29: 400 mg via ORAL
  Filled 2021-10-29: qty 1

## 2021-10-29 MED ORDER — SUGAMMADEX SODIUM 200 MG/2ML IV SOLN
INTRAVENOUS | Status: DC | PRN
  Administered 2021-10-29: 200 mg via INTRAVENOUS

## 2021-10-29 MED ORDER — CHLORHEXIDINE GLUCONATE 0.12 % MT SOLN
15.0000 mL | Freq: Once | OROMUCOSAL | Status: AC
  Filled 2021-10-29: qty 15

## 2021-10-29 MED ORDER — LACTATED RINGERS IV SOLN: INTRAVENOUS | Status: DC

## 2021-10-29 MED ORDER — VANCOMYCIN HCL IN DEXTROSE 1-5 GM/200ML-% IV SOLN
1000.0000 mg | INTRAVENOUS | Status: AC
  Administered 2021-10-29 (×2): 1000 mg via INTRAVENOUS
  Filled 2021-10-29: qty 200

## 2021-10-29 MED ORDER — SODIUM CHLORIDE 0.9% FLUSH
3.0000 mL | Freq: Two times a day (BID) | INTRAVENOUS | Status: DC
  Administered 2021-10-29 – 2021-10-30 (×2): 3 mL via INTRAVENOUS

## 2021-10-29 MED ORDER — CHLORHEXIDINE GLUCONATE CLOTH 2 % EX PADS
6.0000 | MEDICATED_PAD | Freq: Every day | CUTANEOUS | Status: DC
  Administered 2021-10-30: 6 via TOPICAL

## 2021-10-29 MED ORDER — HEPARIN (PORCINE) IN NACL 1000-0.9 UT/500ML-% IV SOLN
INTRAVENOUS | Status: DC | PRN
  Administered 2021-10-29: 500 mL

## 2021-10-29 MED ORDER — MUPIROCIN 2 % EX OINT
1.0000 | TOPICAL_OINTMENT | Freq: Two times a day (BID) | CUTANEOUS | Status: DC
  Administered 2021-10-29 – 2021-10-30 (×2): 1 via NASAL
  Filled 2021-10-29: qty 22

## 2021-10-29 MED ORDER — DOFETILIDE 250 MCG PO CAPS
250.0000 ug | ORAL_CAPSULE | Freq: Two times a day (BID) | ORAL | Status: DC
  Administered 2021-10-29 – 2021-10-30 (×2): 250 ug via ORAL
  Filled 2021-10-29 (×3): qty 1

## 2021-10-29 MED ORDER — EPHEDRINE SULFATE-NACL 50-0.9 MG/10ML-% IV SOSY
PREFILLED_SYRINGE | INTRAVENOUS | Status: DC | PRN
  Administered 2021-10-29 (×2): 2.5 mg via INTRAVENOUS

## 2021-10-29 MED ORDER — ROSUVASTATIN CALCIUM 5 MG PO TABS
10.0000 mg | ORAL_TABLET | Freq: Every day | ORAL | Status: DC
  Administered 2021-10-29: 10 mg via ORAL
  Filled 2021-10-29: qty 2

## 2021-10-29 MED ORDER — LIDOCAINE 2% (20 MG/ML) 5 ML SYRINGE
INTRAMUSCULAR | Status: DC | PRN
  Administered 2021-10-29: 60 mg via INTRAVENOUS

## 2021-10-29 MED ORDER — RIVAROXABAN 20 MG PO TABS
20.0000 mg | ORAL_TABLET | Freq: Every day | ORAL | Status: DC
  Administered 2021-10-29: 20 mg via ORAL
  Filled 2021-10-29: qty 1

## 2021-10-29 MED ORDER — ONDANSETRON HCL 4 MG/2ML IJ SOLN: 4.0000 mg | Freq: Four times a day (QID) | INTRAMUSCULAR | Status: DC | PRN

## 2021-10-29 MED ORDER — HEPARIN (PORCINE) IN NACL 2000-0.9 UNIT/L-% IV SOLN
INTRAVENOUS | Status: DC | PRN
  Administered 2021-10-29: 1000 mL

## 2021-10-29 SURGICAL SUPPLY — 21 items
BAG SNAP BAND KOVER 36X36 (MISCELLANEOUS) IMPLANT
BLANKET WARM UNDERBOD FULL ACC (MISCELLANEOUS) ×1 IMPLANT
CATH INFINITI 5FR ANG PIGTAIL (CATHETERS) IMPLANT
CLOSURE PERCLOSE PROSTYLE (VASCULAR PRODUCTS) IMPLANT
DEVICE WATCHMAN FLX PROC (KITS) IMPLANT
DILATOR VESSEL 10FR 20CM (INTRODUCER) IMPLANT
KIT HEART LEFT (KITS) ×1 IMPLANT
KIT SHEA VERSACROSS LAAC CONNE (KITS) IMPLANT
PACK CARDIAC CATHETERIZATION (CUSTOM PROCEDURE TRAY) ×1 IMPLANT
PAD DEFIB RADIO PHYSIO CONN (PAD) ×1 IMPLANT
SHEATH PERFORMER 16FR 30 (SHEATH) IMPLANT
SHEATH PINNACLE 8F 10CM (SHEATH) IMPLANT
SHEATH PROBE COVER 6X72 (BAG) ×1 IMPLANT
SYS WATCHMAN FXD DBL (SHEATH) ×1
SYSTEM WATCHMAN FXD DBL (SHEATH) IMPLANT
TRANSDUCER W/STOPCOCK (MISCELLANEOUS) ×1 IMPLANT
TUBING CIL FLEX 10 FLL-RA (TUBING) ×1 IMPLANT
WATCHMAN FLX 24 (Prosthesis & Implant Heart) IMPLANT
WATCHMAN FLX 27 (Prosthesis & Implant Heart) IMPLANT
WATCHMAN FLX PROCEDURE DEVICE (KITS) ×1 IMPLANT
WATCHMAN PROCED TRUSEAL ACCESS (SHEATH) IMPLANT

## 2021-10-29 NOTE — Progress Notes (Signed)
Pt arrived to 4E. Telebox 25 applied, vitals stable. CHG bath given. Pt in good spirits. Family at bedside. Will continue to monitor.  Jerald Kief, RN

## 2021-10-29 NOTE — Anesthesia Procedure Notes (Signed)
Procedure Name: Intubation Date/Time: 10/29/2021 11:35 AM  Performed by: Moshe Salisbury, CRNAPre-anesthesia Checklist: Patient identified, Emergency Drugs available, Suction available and Patient being monitored Patient Re-evaluated:Patient Re-evaluated prior to induction Oxygen Delivery Method: Circle system utilized Preoxygenation: Pre-oxygenation with 100% oxygen Induction Type: IV induction Ventilation: Mask ventilation without difficulty Laryngoscope Size: Mac and 4 Grade View: Grade I Tube type: Oral Tube size: 7.5 mm Number of attempts: 1 Airway Equipment and Method: Stylet Placement Confirmation: ETT inserted through vocal cords under direct vision, positive ETCO2 and breath sounds checked- equal and bilateral Secured at: 21 cm Tube secured with: Tape Dental Injury: Teeth and Oropharynx as per pre-operative assessment  Comments: Lynnea Ferrier, SRNA

## 2021-10-29 NOTE — Progress Notes (Signed)
  [place] TEAM  Patient doing well s/p Watchman implant. He is hemodynamically stable. Groin site has been stable with no evidence of bleeding or hematoma. Plan for early ambulation after bedrest completed and hopeful discharge over the next 24 hours.   Kathyrn Drown NP-C Structural Heart Team  Pager: [phone] Phone: [phone]

## 2021-10-29 NOTE — H&P (Signed)
Electrophysiology Office Note:     Date:  10/29/2021    ID:  Chritopher [name], DOB 09-15-1950, MRN [id number]   PCP:  Terry Cruel, MD      [facility] Cardiologist:  Terry Moores, MD  [facility] Electrophysiologist:  Terry Epley, MD    Referring MD: Terry Cruel, MD    Chief Complaint: New patient consult for Watchman   History of Present Illness:     Terry [name] is a 71 y.o. male who presents for an evaluation for Watchman device at the request of Terry Cruel, PA-C. Their medical history includes paroxysmal atrial fibrillation (dx 06/2015) s/p TEE/DCCV 08/2015, pericarditis, aortic root dilation, basal cell carcinoma, cardiomyopathy, central retinal vein occlusion, chronic systolic congestive heart failure, hypertension, hyperlipidemia, hypokalemia.   Previously he was admitted to the hospital 08/2020 with retroperitoneal bleed in the setting of anticoagulation with mass-effect on the right kidney. Eliquis was discontinued at that time.   On 08/07/2021 he presented to [facility] ED with left-sided facial droop, expressive aphasia, and upper extremity weakness. 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 Given history of paroxysmal atrial fibrillation he was resumed on anticoagulation with Xarelto.   He saw Terry Cruel, PA-C on 08/18/2021. Sinus bradycardia at rate of 54 bpm, left atrial enlargement, normal axis, no evidence of ischemia, normal QT interval. Given his history of retroperitoneal bleed on anticoagulation he was referred to EP for evaluation and consideration of Watchman device.   Today, he is accompanied by his wife and daughter. He remains compliant with tikosyn. He has been on Xarelto for a couple of weeks. He denies any bleeding issues, and no side effects that were different than when he was on Eliquis. However, he does complain of constantly feeling cold. They report that 09/23/21 marks 45 days of being on Xarelto.   He has been wearing his  smart watch every day since his prior retroperitoneal bleed. On 2 occasions it has detected an irregular rhythm, but it never lasted long.   Regarding his diet he has been conscientious of his sodium intake. He also successfully avoids alcohol.   For exercise he often lifts weights and uses a stair-climber.   They deny any chest pain, shortness of breath, or peripheral edema. No lightheadedness, headaches, syncope, orthopnea, or PND.   Also he continues to work with a Astronomer.   Today, he presents for Watchman implant.   Objective      Past Medical History:  Diagnosis Date   Aortic root dilation ([place])      a. mild by CT 10/2017.   Basal cell carcinoma 10/02/2019    left hand bcc nod.   Cardiomyopathy [facility])     Central retinal vein occlusion     Chronic systolic CHF (congestive heart failure) (HCC)     ED (erectile dysfunction)     H/O cardiac catheterization 2002 - normal cors   HTN (hypertension)     Hyperlipidemia     Hypokalemia     Leg edema     LV dysfunction      a. EF appeared mildly depressed by TEE 08/2015, no % given.   Mini stroke      a. ? Age 17 at time of central retinal vein occlusion - lost vision in eye   PAF (paroxysmal atrial fibrillation) ([place])      a. Dx 06/2015, s/p TEE/DCCV 08/2015.   Pericarditis 2010   Syncope      Hx of thought  to be due to orthostatic hypotension   Visit for monitoring Tikosyn therapy 01/17/2018           Past Surgical History:  Procedure Laterality Date   CARDIAC CATHETERIZATION   2002   CARDIOVERSION N/A 09/08/2015    Procedure: CARDIOVERSION;  Surgeon: Fay Records, MD;  Location: [facility];  Service: Cardiovascular;  Laterality: N/A;   CARDIOVERSION N/A 06/28/2017    Procedure: CARDIOVERSION;  Surgeon: Jerline Pain, MD;  Location: [facility] ENDOSCOPY;  Service: Cardiovascular;  Laterality: N/A;   HAND SURGERY   2003   KNEE SURGERY       NASAL SINUS SURGERY   2003    x2. Scar tissue build up and had to recreate his  tear duct   PERICARDIECTOMY       TEE WITHOUT CARDIOVERSION N/A 09/08/2015    Procedure: TRANSESOPHAGEAL ECHOCARDIOGRAM (TEE);  Surgeon: Fay Records, MD;  Location: [facility] ENDOSCOPY;  Service: Cardiovascular;  Laterality: N/A;   TEE WITHOUT CARDIOVERSION N/A 06/28/2017    Procedure: TRANSESOPHAGEAL ECHOCARDIOGRAM (TEE);  Surgeon: Jerline Pain, MD;  Location: [facility] ENDOSCOPY;  Service: Cardiovascular;  Laterality: N/A;      Current Medications: Active Medications      Current Meds  Medication Sig   acetaminophen (TYLENOL) 500 MG tablet Take 1,000 mg by mouth every 4 (four) hours as needed for moderate pain, headache or fever.   Cholecalciferol (VITAMIN D3 ULTRA STRENGTH) 125 MCG (5000 UT) capsule Take 5,000 Units by mouth daily.   dofetilide (TIKOSYN) 250 MCG capsule TAKE 1 CAPSULE BY MOUTH 2 TIMES DAILY.   fluticasone (FLONASE) 50 MCG/ACT nasal spray Place 2 sprays into both nostrils every evening.    guaiFENesin (MUCINEX) 600 MG 12 hr tablet Take 600 mg by mouth 2 (two) times daily as needed for cough.   magnesium oxide (MAG-OX) 400 (240 Mg) MG tablet TAKE 1 TABLET BY MOUTH EVERY DAY   magnesium oxide (MAG-OX) 400 (240 Mg) MG tablet Take 400 mg by mouth at bedtime.   metoprolol succinate (TOPROL-XL) 50 MG 24 hr tablet Take 1 tablet (50 mg total) by mouth daily.   Probiotic Product (PROBIOTIC DAILY PO) Take 1 capsule by mouth daily.   rivaroxaban (XARELTO) 20 MG TABS tablet Take 1 tablet (20 mg total) by mouth daily with supper.   rosuvastatin (CRESTOR) 10 MG tablet Take 1 tablet (10 mg total) by mouth at bedtime.   zinc gluconate 50 MG tablet Take 50 mg by mouth daily.        Allergies:   Losartan, Penicillins, and Penicillins    Social History         Socioeconomic History   Marital status: Married      Spouse name: Terry [name]   Number of children: Not on file   Years of education: Not on file   Highest education level: Not on file  Occupational History   Not on file  Tobacco Use    Smoking status: Never   Smokeless tobacco: Never  Vaping Use   Vaping Use: Never used  Substance and Sexual Activity   Alcohol use: Not Currently      Comment: 2-3 Times a Week Wine with Meals   Drug use: Never   Sexual activity: Not on file  Other Topics Concern   Not on file  Social History Narrative    ** Merged History Encounter **         Social Determinants of Health    Financial Resource Strain: Not on  file  Food Insecurity: Not on file  Transportation Needs: Not on file  Physical Activity: Not on file  Stress: Not on file  Social Connections: Not on file      Family History: The patient's family history includes Atrial fibrillation in his brother and father; Cancer in his mother; Diabetes in his father and mother; Heart attack (age of onset: 49) in his brother; Heart disease in his father; Hypertension in his father and mother.   ROS:   Please see the history of present illness.    (+) Chills All other systems reviewed and are negative.   EKGs/Labs/Other Studies Reviewed:     The following studies were reviewed today:   08/08/2021  Echocardiogram:  1. Left ventricular ejection fraction, by estimation, is 60 to 65%. The  left ventricle has normal function. The left ventricle has no regional  wall motion abnormalities. Left ventricular diastolic parameters were  normal.   2. Right ventricular systolic function is normal. The right ventricular  size is normal. There is normal pulmonary artery systolic pressure.   3. Left atrial size was moderately dilated.   4. The mitral valve is grossly normal. Mild mitral valve regurgitation. No evidence of mitral stenosis.   5. The aortic valve is tricuspid. Aortic valve regurgitation is not  visualized. No aortic stenosis is present.   6. Aortic proximal ascending aorta within normal limits. There is  borderline dilatation of the aortic root, measuring 38 mm.   Comparison(s): No significant change when compared to prior  echo dated 10/20/2020.   Conclusion(s)/Recommendation(s): No intracardiac source of embolism detected on this transthoracic study. Consider a transesophageal echocardiogram to exclude cardiac source of embolism if clinically indicated. No left ventricular mural or apical  thrombus/thrombi.    08/07/2021  CTA Head/Neck: IMPRESSION: 1.  No intracranial large vessel occlusion or significant stenosis. 2.  No hemodynamically significant stenosis in the neck.   11/09/2017  Cardiac CTA: IMPRESSION: 1. Calcium score 5 isolated area in mid LAD this is 26 th percentile for age and sex   2.  No significant obstructive CAD right dominant coronary arteries   3.  Mild aortic root dilatation 4.0 cm     EKG:   EKG is personally reviewed.  09/10/2021: Sinus rhythm.  QTc is 477 ms.     Recent Labs: 08/09/2021: ALT 16; BUN 10; Creatinine, Ser 1.08; Hemoglobin 14.2; Magnesium 1.7; Platelets 186; Potassium 3.5; Sodium 142    Recent Lipid Panel Labs (Brief)          Component Value Date/Time    CHOL 144 08/08/2021 0558    CHOL 155 10/08/2019 0817    TRIG 121 08/08/2021 0558    HDL 33 (L) 08/08/2021 0558    HDL 36 (L) 10/08/2019 0817    CHOLHDL 4.4 08/08/2021 0558    VLDL 24 08/08/2021 0558    LDLCALC 87 08/08/2021 0558    LDLCALC 95 10/08/2019 0817        Physical Exam:     VS:  BP 142/92   Pulse 55   Ht 6' (1.829 m)   Wt 178 lb 12.8 oz (81.1 kg)   SpO2 97%   BMI 24.25 kg/m         Wt Readings from Last 3 Encounters:  09/10/21 178 lb 12.8 oz (81.1 kg)  09/03/21 185 lb (83.9 kg)  08/18/21 178 lb 12.8 oz (81.1 kg)      GEN: Well nourished, well developed in no acute distress HEENT: Normal NECK:  No JVD; No carotid bruits LYMPHATICS: No lymphadenopathy CARDIAC: RRR, no murmurs, rubs, gallops RESPIRATORY:  Clear to auscultation without rales, wheezing or rhonchi  ABDOMEN: Soft, non-tender, non-distended MUSCULOSKELETAL:  No edema; No deformity  SKIN: Warm and dry NEUROLOGIC:   Alert and oriented x 3 PSYCHIATRIC:  Normal affect          Assessment ASSESSMENT:     1. Persistent atrial fibrillation ([place])   2. Pre-op evaluation   3. Encounter for long-term (current) use of high-risk medication     PLAN:     In order of problems listed above:   #Persistent atrial fibrillation His A-fib is relatively well controlled on Tikosyn.  QTc is acceptable for continued Tikosyn use.  He is currently taking Xarelto for stroke prophylaxis but desires a stroke risk mitigation strategy that avoids long-term exposure to anticoagulation.  We discussed left atrial appendage occlusion as a mechanism to achieve this goal.  I discussed the Watchman procedure in detail during today's visit including the risks, recovery and likelihood of success.  I discussed the need for short-term anticoagulation.  He wishes to proceed with scheduling.   I have seen Caeson Filippi in the office today who is being considered for a Watchman left atrial appendage closure device. I believe they will benefit from this procedure given their history of atrial fibrillation, CHA2DS2-VASc score of 6 and unadjusted ischemic stroke rate of 9.7% per year. Unfortunately, the patient is not felt to be a long term anticoagulation candidate secondary to history of large RP bleed. The patient's chart has been reviewed and I feel that they would be a candidate for short term oral anticoagulation after Watchman implant.    It is my belief that after undergoing a LAA closure procedure, Yoshi Mancillas will not need long term anticoagulation which eliminates anticoagulation side effects and major bleeding risk.    Procedural risks for the Watchman implant have been reviewed with the patient including a 0.5% risk of stroke, <1% risk of perforation and <1% risk of device embolization. Other risks include bleeding, vascular damage, tamponade, worsening renal function, and death. The patient understands these risk and wishes to  proceed.       The published clinical data on the safety and effectiveness of WATCHMAN include but are not limited to the following: - Holmes DR, Mechele Claude, Sick P et al. for the PROTECT AF Investigators. Percutaneous closure of the left atrial appendage versus warfarin therapy for prevention of stroke in patients with atrial fibrillation: a randomised non-inferiority trial. Lancet 2009; 374: 534-42. Mechele Claude, Doshi SK, Abelardo Diesel D et al. on behalf of the PROTECT AF Investigators. Percutaneous Left Atrial Appendage Closure for Stroke Prophylaxis in Patients With Atrial Fibrillation 2.3-Year Follow-up of the PROTECT AF (Watchman Left Atrial Appendage System for Embolic Protection in Patients With Atrial Fibrillation) Trial. Circulation 2013; 127:720-729. - Alli O, Doshi S,  Kar S, Reddy VY, Sievert H et al. Quality of Life Assessment in the Randomized PROTECT AF (Percutaneous Closure of the Left Atrial Appendage Versus Warfarin Therapy for Prevention of Stroke in Patients With Atrial Fibrillation) Trial of Patients at Risk for Stroke With Nonvalvular Atrial Fibrillation. J Am Coll Cardiol 2013; 40:3474-2. - [place], Tarri Abernethy, Price M, Whisenant B, Sievert H, Doshi S, Huber K, Reddy V. Prospective randomized evaluation of the Watchman left atrial appendage Device in patients with atrial fibrillation versus long-term warfarin therapy; the PREVAIL trial. Journal of the [employer] of Cardiology, Vol. 4, No. 1,  2014, 1-11. - Kar S, Doshi SK, Sadhu A, Horton R, Osorio J et al. Primary outcome evaluation of a next-generation left atrial appendage closure device: results from the PINNACLE FLX trial. Circulation 2021;143(18)1754-1762.      After today's visit with the patient which was dedicated solely for shared decision making visit regarding LAA closure device, the patient decided to proceed with the LAA appendage closure procedure scheduled to be done in the near future at [facility] Prior to the procedure, I would like to obtain a gated CT scan of the chest with contrast timed for PV/LA visualization.      HAS-BLED score 4 Hypertension Yes  Abnormal renal and liver function (Dialysis, transplant, Cr >2.26 mg/dL /Cirrhosis or Bilirubin >2x Normal or AST/ALT/AP >3x Normal) No  Stroke Yes  Bleeding Yes  Labile INR (Unstable/high INR) No  Elderly (>65) Yes  Drugs or alcohol (? 8 drinks/week, anti-plt or NSAID) No    CHA2DS2-VASc Score = 6  The patient's score is based upon: CHF History: 1 HTN History: 1 Diabetes History: 0 Stroke History: 2 Vascular Disease History: 1 Age Score: 1 Gender Score: 0  Presents for Watchman implant.    Signed, Hilton Cork. Quentin Ore, MD, [facility], [facility] 10/29/2021 Electrophysiology [facility]

## 2021-10-29 NOTE — Anesthesia Preprocedure Evaluation (Signed)
Anesthesia Evaluation  Patient identified by MRN, date of birth, ID band Patient awake    Reviewed: Allergy & Precautions, NPO status , Patient's Chart, lab work & pertinent test results  Airway Mallampati: II  TM Distance: >3 FB Neck ROM: Full    Dental no notable dental hx.    Pulmonary neg pulmonary ROS,    Pulmonary exam normal        Cardiovascular hypertension, Pt. on home beta blockers +CHF  Normal cardiovascular exam+ dysrhythmias Atrial Fibrillation      Neuro/Psych Anxiety CVA    GI/Hepatic negative GI ROS, Neg liver ROS,   Endo/Other  negative endocrine ROS  Renal/GU negative Renal ROS     Musculoskeletal negative musculoskeletal ROS (+)   Abdominal   Peds  Hematology negative hematology ROS (+)   Anesthesia Other Findings Atrial fibrillation  Reproductive/Obstetrics                             Anesthesia Physical Anesthesia Plan  ASA: 3  Anesthesia Plan: General   Post-op Pain Management:    Induction: Intravenous  PONV Risk Score and Plan: 2 and Ondansetron, Dexamethasone, Midazolam and Treatment may vary due to age or medical condition  Airway Management Planned: Oral ETT  Additional Equipment: ClearSight and TEE  Intra-op Plan:   Post-operative Plan: Extubation in OR  Informed Consent: I have reviewed the patients History and Physical, chart, labs and discussed the procedure including the risks, benefits and alternatives for the proposed anesthesia with the patient or authorized representative who has indicated his/her understanding and acceptance.     Dental advisory given  Plan Discussed with: CRNA  Anesthesia Plan Comments: (TEE probe placement only )        Anesthesia Quick Evaluation

## 2021-10-29 NOTE — Progress Notes (Signed)
  Echocardiogram Echocardiogram Transesophageal has been performed.  Terry [name] 10/29/2021, 2:52 PM

## 2021-10-29 NOTE — Transfer of Care (Signed)
Immediate Anesthesia Transfer of Care Note  Patient: Terry [name]  Procedure(s) Performed: LEFT ATRIAL APPENDAGE OCCLUSION TRANSESOPHAGEAL ECHOCARDIOGRAM (TEE)  Patient Location: Cath Lab  Anesthesia Type:General  Level of Consciousness: awake and patient cooperative  Airway & Oxygen Therapy: Patient Spontanous Breathing and Patient connected to nasal cannula oxygen  Post-op Assessment: Report given to RN and Post -op Vital signs reviewed and stable  Post vital signs: Reviewed and stable   Last Vitals:  Vitals Value Taken Time  BP    Temp    Pulse    Resp    SpO2      Last Pain:  Vitals:   10/29/21 1150  TempSrc:   PainSc: 0-No pain      Patients Stated Pain Goal: 0 (37/10/62 6948)  Complications: There were no known notable events for this encounter.

## 2021-10-29 NOTE — Discharge Summary (Signed)
[facility]    Patient ID: Terry [name],  MRN: [id number], DOB/AGE: [birth year] 71 y.o.  Admit date: 10/29/2021 Discharge date: 10/30/2021  Primary Care Physician: Lawerance Cruel, MD  Primary Cardiologist: Mertie Moores, MD  Electrophysiologist: Vickie Epley, MD  Primary Discharge Diagnosis:  Paroxysmal Atrial Fibrillation Poor candidacy for long term anticoagulation due to  significant bleeding   Secondary Discharge Diagnosis:  -pericarditis -aortic root dilation -basal cell carcinoma -cardiomyopathy -central retinal vein occlusion -chronic systolic congestive heart failure -hypertension -hyperlipidemia  Procedures This Admission:  Transeptal Puncture Intra-procedural TEE which showed no LAA thrombus Left atrial appendage occlusive device placement on 10/29/21 by Dr. Quentin Ore.   Brief HPI: Terry [name] is a 71 y.o. male with a history of paroxysmal atrial fibrillation (dx 06/2015) s/p TEE/DCCV 08/2015, pericarditis, aortic root dilation, basal cell carcinoma, cardiomyopathy, central retinal vein occlusion, chronic systolic congestive heart failure, hypertension, hyperlipidemia, and hypokalemia.   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  At that time Eliquis was discontinued given relatively low CHA2DS2-VASc score and bleeding. He is followed with Dr. Irven Shelling team was was seen in follow up 01/21/2021 at which time he was stable from a cardiovascular standpoint and advised to follow-up in 1 year.   He unfortunately presented once again to [facility] with left-sided facial droop and expressive aphasia as well as weakness of the upper extremities on 08/07/2021. Imaging revealed right MCA infarct. Shared decision with neurology was to resume anticoagulation and stop antiplatelets given history of paroxysmal atrial fibrillation. He was seen in follow up by Lawerance Cruel, PA at  which time he was noted to be doing well with no new neuro changes.    He was referred to EP for consideration of Watchman implant and was seen by Dr. Quentin Ore 09/10/21. He was reportedly compliant Tikosyn and had been tolerating Xarelto after restarting as outlined above. He underwent pre LAAO CT imaging that showed anatomy suitable for implant.    Hospital Course:  The patient was admitted and underwent left atrial appendage occlusive device placement with Watchman FLX 50m device.  He was monitored on telemetry overnight which demonstrated NSR. Groin site was without complication on the day of discharge. The patient was examined and considered to be stable for discharge.  Wound care and restrictions were reviewed with the patient. The patient has been scheduled for post procedure follow up with JKathyrn Drown NP in one month. Medication plan will be to continue Xarelto for 45 days at which time he will transition to Plavix '75mg'$  QD for 6 months post implant. A repeat CT will be performed at 60 day post implant to ensure proper seal of the device.   Physical Exam: Vitals:   10/29/21 2004 10/29/21 2339 10/30/21 0417 10/30/21 0743  BP: (!) 102/48 1'02/64 98/64 98/68 '$  Pulse: 60 60 (!) 53 (!) 53  Resp: '18 16 17 16  '$ Temp: 97.9 F (36.6 C) 97.9 F (36.6 C) 97.8 F (36.6 C) (!) 97.4 F (36.3 C)  TempSrc: Oral Oral Oral Oral  SpO2: 96% 94% 96% 94%  Weight:      Height:       General: Well developed, well nourished, NAD Neck: Negative for carotid bruits. No JVD Lungs:Clear to ausculation bilaterally. No wheezes, rales, or rhonchi. Breathing is unlabored. Cardiovascular: RRR with S1 S2. No murmurs Extremities: No edema. Neuro: Alert and oriented. No focal deficits. No facial asymmetry. MAE spontaneously. Psych:  Responds to questions appropriately with normal affect.    Labs:   Lab Results  Component Value Date   WBC 4.1 10/21/2021   HGB 15.2 10/21/2021   HCT 45.7 10/21/2021   MCV 90  10/21/2021   PLT 181 10/21/2021    Recent Labs  Lab 10/30/21 0302  NA 138  K 4.6  CL 109  CO2 23  BUN 19  CREATININE 1.14  CALCIUM 8.8*  GLUCOSE 153*   Discharge Medications:  Allergies as of 10/30/2021       Reactions   Losartan Other (See Comments)   Makes BP drop very low   Penicillins Other (See Comments)   Childhood reaction Has patient had a PCN reaction causing immediate rash, facial/tongue/throat swelling, SOB or lightheadedness with hypotension: yes Has patient had a PCN reaction causing severe rash involving mucus membranes or skin necrosis: no Has patient had a PCN reaction that required hospitalization: no Has patient had a PCN reaction occurring within the last 10 years: no If all of the above answers are "NO", then may proceed with Cephalosporin use.        Medication List     TAKE these medications    acetaminophen 500 MG tablet Commonly known as: TYLENOL Take 1,000 mg by mouth every 4 (four) hours as needed for moderate pain, headache or fever.   BENGAY EX Apply 1 Application topically 4 (four) times daily as needed (soreness/pain.).   dofetilide 250 MCG capsule Commonly known as: TIKOSYN TAKE 1 CAPSULE BY MOUTH 2 TIMES DAILY.   fluticasone 50 MCG/ACT nasal spray Commonly known as: FLONASE Place 2 sprays into both nostrils every evening.   guaiFENesin 600 MG 12 hr tablet Commonly known as: MUCINEX Take 600 mg by mouth 2 (two) times daily as needed for cough.   iVIZIA Dry Eyes 0.5 % Soln Generic drug: Povidone (PF) Place 1 drop into both eyes in the morning and at bedtime. iVIZIA Lubricant Eye Gel for Severe and Nighttime Dry Eye Relief   magnesium oxide 400 (240 Mg) MG tablet Commonly known as: MAG-OX Take 400 mg by mouth at bedtime.   metoprolol succinate 50 MG 24 hr tablet Commonly known as: TOPROL-XL Take 1 tablet (50 mg total) by mouth daily. What changed: when to take this   PROBIOTIC DAILY PO Take 1 capsule by mouth in the  morning. TruNature Advanced Digestive Probiotic   rivaroxaban 20 MG Tabs tablet Commonly known as: XARELTO Take 1 tablet (20 mg total) by mouth daily with supper.   rosuvastatin 5 MG tablet Commonly known as: CRESTOR Take 5 mg by mouth at bedtime.   rosuvastatin 10 MG tablet Commonly known as: CRESTOR Take 1 tablet (10 mg total) by mouth at bedtime.   Vitamin D3 Ultra Strength 125 MCG (5000 UT) capsule Generic drug: Cholecalciferol Take 5,000 Units by mouth at bedtime.   zinc gluconate 50 MG tablet Take 50 mg by mouth at bedtime.               Discharge Care Instructions  (From admission, onward)           Start     Ordered   10/30/21 0000  No dressing needed        10/30/21 1025            Disposition:  Home  Discharge Instructions     Call MD for:  difficulty breathing, headache or visual disturbances   Complete by: As directed    Call MD for:  extreme fatigue   Complete by: As directed    Call MD for:  hives   Complete by: As directed    Call MD for:  persistant dizziness or light-headedness   Complete by: As directed    Call MD for:  persistant nausea and vomiting   Complete by: As directed    Call MD for:  redness, tenderness, or signs of infection (pain, swelling, redness, odor or green/yellow discharge around incision site)   Complete by: As directed    Call MD for:  severe uncontrolled pain   Complete by: As directed    Call MD for:  temperature >100.4   Complete by: As directed    Diet - low sodium heart healthy   Complete by: As directed    Discharge instructions   Complete by: As directed    [facility] Procedure, Care After  Procedure MD: Dr. Benson Norway Clinical Coordinator: Lenice Llamas, RN  This sheet gives you information about how to care for yourself after your procedure. Your health care provider may also give you more specific instructions. If you have problems or questions, contact your health care provider.  What can I  expect after the procedure? After the procedure, it is common to have: Bruising around your puncture site. Tenderness around your puncture site. Tiredness (fatigue).  Medication instructions It is very important to continue to take your blood thinner as directed by your doctor after the Watchman procedure. Call your procedure doctor's office with question or concerns. If you are on Coumadin (warfarin), you will have your INR checked the week after your procedure, with a goal INR of 2.0 - 3.0. Please follow your medication instructions on your discharge summary. Only take the medications listed on your discharge paperwork.  Follow up You will be seen in 1 month after your procedure  You will have another CT approximately 8 weeks after your procedure mark to check your device You will follow up the MD/APP who performed your procedure 6 months after your procedure The Watchman Clinical Coordinator will check in with you from time to time, including 1 and 2 years after your procedure.    Follow these instructions at home: Puncture site care  Follow instructions from your health care provider about how to take care of your puncture site. Make sure you: If present, leave stitches (sutures), skin glue, or adhesive strips in place.  If a large square bandage is present, this may be removed 24 hours after surgery.  Check your puncture site every day for signs of infection. Check for: Redness, swelling, or pain. Fluid or blood. If your puncture site starts to bleed, lie down on your back, apply firm pressure to the area, and contact your health care provider. Warmth. Pus or a bad smell. Driving Do not drive yourself home if you received sedation Do not drive for at least 4 days after your procedure or however long your health care provider recommends. (Do not resume driving if you have previously been instructed not to drive for other health reasons.) Do not spend greater than 1 hour at a time  in a car for the first 3 days. Stop and take a break with a 5 minute walk at least every hour.  Do not drive or use heavy machinery while taking prescription pain medicine.  Activity Avoid activities that take a lot of effort, including exercise, for at least 7 days after your procedure. For the first 3 days, avoid sitting for longer than one hour  at a time.  Avoid alcoholic beverages, signing paperwork, or participating in legal proceedings for 24 hours after receiving sedation Do not lift anything that is heavier than 10 lb (4.5 kg) for one week.  No sexual activity for 1 week.  Return to your normal activities as told by your health care provider. Ask your health care provider what activities are safe for you. General instructions Take over-the-counter and prescription medicines only as told by your health care provider. Do not use any products that contain nicotine or tobacco, such as cigarettes and e-cigarettes. If you need help quitting, ask your health care provider. You may shower after 24 hours, but Do not take baths, swim, or use a hot tub for 1 week.  Do not drink alcohol for 24 hours after your procedure. Keep all follow-up visits as told by your health care provider. This is important. Dental Work: You will require antibiotics prior to any dental work, including cleanings, for 6 months after your Watchman implantation to help protect you from infection. After 6 months, antibiotics are no longer required. Contact a health care provider if: You have redness, mild swelling, or pain around your puncture site. You have soreness in your throat or at your puncture site that does not improve after several days You have fluid or blood coming from your puncture site that stops after applying firm pressure to the area. Your puncture site feels warm to the touch. You have pus or a bad smell coming from your puncture site. You have a fever. You have chest pain or discomfort that spreads to  your neck, jaw, or arm. You are sweating a lot. You feel nauseous. You have a fast or irregular heartbeat. You have shortness of breath. You are dizzy or light-headed and feel the need to lie down. You have pain or numbness in the arm or leg closest to your puncture site. Get help right away if: Your puncture site suddenly swells. Your puncture site is bleeding and the bleeding does not stop after applying firm pressure to the area. These symptoms may represent a serious problem that is an emergency. Do not wait to see if the symptoms will go away. Get medical help right away. Call your local emergency services (911 in the [place] Do not drive yourself to the hospital. Summary After the procedure, it is normal to have bruising and tenderness at the puncture site in your groin, neck, or forearm. Check your puncture site every day for signs of infection. Get help right away if your puncture site is bleeding and the bleeding does not stop after applying firm pressure to the area. This is a medical emergency.  This information is not intended to replace advice given to you by your health care provider. Make sure you discuss any questions you have with your health care provider.   Increase activity slowly   Complete by: As directed    No dressing needed   Complete by: As directed        Follow-up Information     Tommie Raymond, NP Follow up.   Specialty: Cardiology Why: @ 12pm. Please arive by 11:45am. Contact information: [address] [place] [address] [phone]                 Duration of Discharge Encounter: Greater than 30 minutes including physician time.  Signed, Kathyrn Drown, NP  10/30/2021 10:25 AM

## 2021-10-29 NOTE — Anesthesia Postprocedure Evaluation (Signed)
Anesthesia Post Note  Patient: Terry [name]  Procedure(s) Performed: LEFT ATRIAL APPENDAGE OCCLUSION TRANSESOPHAGEAL ECHOCARDIOGRAM (TEE)     Patient location during evaluation: PACU Anesthesia Type: General Level of consciousness: awake and alert Pain management: pain level controlled Vital Signs Assessment: post-procedure vital signs reviewed and stable Respiratory status: spontaneous breathing, nonlabored ventilation, respiratory function stable and patient connected to nasal cannula oxygen Cardiovascular status: blood pressure returned to baseline and stable Postop Assessment: no apparent nausea or vomiting Anesthetic complications: no   There were no known notable events for this encounter.  Last Vitals:  Vitals:   10/29/21 1550 10/29/21 1610  BP: 133/74 134/78  Pulse: (!) 52 60  Resp: (!) 21 20  Temp:  (!) 36.4 C  SpO2: 95% 100%    Last Pain:  Vitals:   10/29/21 1610  TempSrc: Oral  PainSc: 0-No pain                 Belenda Cruise P Merton Wadlow

## 2021-10-30 ENCOUNTER — Other Ambulatory Visit: Payer: Self-pay

## 2021-10-30 DIAGNOSIS — I5022 Chronic systolic (congestive) heart failure: Secondary | ICD-10-CM | POA: Diagnosis not present

## 2021-10-30 DIAGNOSIS — I4819 Other persistent atrial fibrillation: Secondary | ICD-10-CM

## 2021-10-30 DIAGNOSIS — I429 Cardiomyopathy, unspecified: Secondary | ICD-10-CM | POA: Diagnosis not present

## 2021-10-30 DIAGNOSIS — Z95818 Presence of other cardiac implants and grafts: Secondary | ICD-10-CM

## 2021-10-30 DIAGNOSIS — Z006 Encounter for examination for normal comparison and control in clinical research program: Secondary | ICD-10-CM | POA: Diagnosis not present

## 2021-10-30 LAB — BASIC METABOLIC PANEL
Anion gap: 6 (ref 5–15)
BUN: 19 mg/dL (ref 8–23)
CO2: 23 mmol/L (ref 22–32)
Calcium: 8.8 mg/dL — ABNORMAL LOW (ref 8.9–10.3)
Chloride: 109 mmol/L (ref 98–111)
Creatinine, Ser: 1.14 mg/dL (ref 0.61–1.24)
GFR, Estimated: >60 mL/min (ref >60)
Glucose, Bld: 153 mg/dL — ABNORMAL HIGH (ref 70–99)
Potassium: 4.6 mmol/L (ref 3.5–5.1)
Sodium: 138 mmol/L (ref 135–145)

## 2021-10-30 NOTE — Plan of Care (Signed)
  Problem: Cardiovascular: Goal: Ability to achieve and maintain adequate cardiovascular perfusion will improve Outcome: Adequate for Discharge Goal: Vascular access site(s) Level 0-1 will be maintained Outcome: Adequate for Discharge   Problem: Health Behavior/Discharge Planning: Goal: Ability to safely manage health-related needs after discharge will improve Outcome: Adequate for Discharge   Problem: Education: Goal: Knowledge of General Education information will improve Description: Including pain rating scale, medication(s)/side effects and non-pharmacologic comfort measures Outcome: Adequate for Discharge   Problem: Clinical Measurements: Goal: Ability to maintain clinical measurements within normal limits will improve Outcome: Adequate for Discharge Goal: Will remain free from infection Outcome: Adequate for Discharge Goal: Diagnostic test results will improve Outcome: Adequate for Discharge Goal: Respiratory complications will improve Outcome: Adequate for Discharge Goal: Cardiovascular complication will be avoided Outcome: Adequate for Discharge   Problem: Activity: Goal: Risk for activity intolerance will decrease Outcome: Adequate for Discharge   Problem: Elimination: Goal: Will not experience complications related to bowel motility Outcome: Adequate for Discharge Goal: Will not experience complications related to urinary retention Outcome: Adequate for Discharge   Problem: Pain Managment: Goal: General experience of comfort will improve Outcome: Adequate for Discharge   Problem: Safety: Goal: Ability to remain free from injury will improve Outcome: Adequate for Discharge   Problem: Skin Integrity: Goal: Risk for impaired skin integrity will decrease Outcome: Adequate for Discharge

## 2021-10-30 NOTE — Progress Notes (Signed)
Pt to be d/c from 4E to home with wife. Medication education and d/c information provided. IV and tele removed.  Raelyn Number, RN

## 2021-11-02 ENCOUNTER — Encounter (HOSPITAL_COMMUNITY): Payer: Self-pay | Admitting: Cardiology

## 2021-11-02 ENCOUNTER — Telehealth: Payer: Self-pay

## 2021-11-02 NOTE — Telephone Encounter (Signed)
  [facility] Team  Contacted the patient regarding discharge from [facility] on 10/30/2021  The patient understands to follow up with Kathyrn Drown, NP on 11/30/2021 in preparation for CT on 12/31/2021.  The patient understands discharge instructions? Yes  The patient understands medications and regimen? Yes   The patient reports groin sites look healthy with no signs or symptoms of bleeding or infection.  The patient understands to call with any questions or concerns prior to scheduled visit.    While on the phone, the patient questioned why he had 2 listings for rosuvastatin in his medication list. He states he is taking Crestor 10 mg daily. Med list updated.

## 2021-11-10 DIAGNOSIS — I480 Paroxysmal atrial fibrillation: Secondary | ICD-10-CM | POA: Diagnosis not present

## 2021-11-10 DIAGNOSIS — Z09 Encounter for follow-up examination after completed treatment for conditions other than malignant neoplasm: Secondary | ICD-10-CM | POA: Diagnosis not present

## 2021-11-19 ENCOUNTER — Encounter: Payer: Self-pay | Admitting: Cardiology

## 2021-11-19 ENCOUNTER — Ambulatory Visit: Payer: PPO | Admitting: Cardiology

## 2021-11-19 VITALS — BP 124/88 | HR 71 | Temp 98.0°F | Resp 16 | Ht 72.0 in | Wt 175.0 lb

## 2021-11-19 DIAGNOSIS — I341 Nonrheumatic mitral (valve) prolapse: Secondary | ICD-10-CM | POA: Diagnosis not present

## 2021-11-19 DIAGNOSIS — I77810 Thoracic aortic ectasia: Secondary | ICD-10-CM | POA: Diagnosis not present

## 2021-11-19 DIAGNOSIS — Z95818 Presence of other cardiac implants and grafts: Secondary | ICD-10-CM

## 2021-11-19 DIAGNOSIS — I480 Paroxysmal atrial fibrillation: Secondary | ICD-10-CM

## 2021-11-19 DIAGNOSIS — I10 Essential (primary) hypertension: Secondary | ICD-10-CM

## 2021-11-19 NOTE — Progress Notes (Signed)
Primary Physician/Referring:  Daisy Floro, MD  Patient ID: Terry [name], male    DOB: 06-06-1950, 71 y.o.   MRN: [id number]  Chief Complaint  Patient presents with   Atrial Fibrillation   Hypertension   Follow-up    3 month    HPI:    Terry [name]  is a 71 y.o. 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    Notably patient was admitted 08/2020 with retroperitoneal bleed due to patient being on Eliquis with mass-effect on the right kidney. Eliquis was discontinued given relatively low CHA2DS2-VASc score.  Presented to [facility] emergency department with left-sided facial droop and expressive aphasia as well as weakness of the upper extremities on 08/07/2021, started on Xarelto.  Underwent successful placement of Watchman device on 10/29/2021 without periprocedural complications.  He now presents for follow-up.  States that his neurologic deficits of dysarthria and word finding has improved significantly.  He has not had any palpitations.  No bleeding diathesis on Xarelto. Past Medical History:  Diagnosis Date   Aortic root dilation (HCC)    a. mild by CT 10/2017.   Basal cell carcinoma 10/02/2019   left hand bcc nod.   Cardiomyopathy [facility])    Central retinal vein occlusion    Chronic systolic CHF (congestive heart failure) (HCC)    ED (erectile dysfunction)    H/O cardiac catheterization 2002 - normal cors   HTN (hypertension)    Hyperlipidemia    Hypokalemia    Leg edema    LV dysfunction    a. EF appeared mildly depressed by TEE 08/2015, no % given.   Mini stroke    a. ? Age 71 at time of central retinal vein occlusion - lost vision in eye   PAF (paroxysmal atrial fibrillation) (HCC)    a. Dx 06/2015, s/p TEE/DCCV 08/2015.   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 Lalla Brothers   Syncope    Hx of thought to be due to orthostatic hypotension   Visit for monitoring Tikosyn therapy 01/17/2018   Past Surgical History:  Procedure Laterality Date   CARDIAC CATHETERIZATION  2002   CARDIOVERSION N/A 09/08/2015   Procedure: CARDIOVERSION;  Surgeon: Pricilla Riffle, MD;  Location: [facility] ENDOSCOPY;  Service: Cardiovascular;  Laterality: N/A;   CARDIOVERSION N/A 06/28/2017   Procedure: CARDIOVERSION;  Surgeon: Jake Bathe, MD;  Location: [facility] ENDOSCOPY;  Service: Cardiovascular;  Laterality: N/A;   HAND SURGERY  2003   KNEE SURGERY     LEFT ATRIAL APPENDAGE OCCLUSION N/A 10/29/2021   Procedure: LEFT ATRIAL APPENDAGE OCCLUSION;  Surgeon: Lanier Prude, MD;  Location: [facility] INVASIVE CV LAB;  Service: Cardiovascular;  Laterality: N/A;   NASAL SINUS SURGERY  2003   x2. Scar tissue build up and had to recreate his tear duct   PERICARDIECTOMY     TEE WITHOUT CARDIOVERSION N/A 09/08/2015   Procedure: TRANSESOPHAGEAL ECHOCARDIOGRAM (TEE);  Surgeon: Pricilla Riffle, MD;  Location: [facility] ENDOSCOPY;  Service: Cardiovascular;  Laterality: N/A;   TEE WITHOUT CARDIOVERSION N/A 06/28/2017   Procedure: TRANSESOPHAGEAL ECHOCARDIOGRAM (TEE);  Surgeon: Jake Bathe, MD;  Location: [facility] ENDOSCOPY;  Service: Cardiovascular;  Laterality: N/A;   TEE WITHOUT CARDIOVERSION N/A 10/29/2021   Procedure: TRANSESOPHAGEAL ECHOCARDIOGRAM (TEE);  Surgeon: Lanier Prude, MD;  Location: [place] CV LAB;  Service: Cardiovascular;  Laterality: N/A;   Family History  Problem Relation Age of Onset   Hypertension Mother    Diabetes Mother    Cancer Mother    Atrial fibrillation Father    Heart disease Father        had pacemaker   Hypertension Father    Diabetes Father    Atrial fibrillation Brother    Heart attack Brother 51    Social History   Tobacco Use   Smoking status: Never   Smokeless tobacco: Never  Substance Use Topics    Alcohol use: Not Currently    Comment: 2-3 Times a Week Wine with Meals   Marital Status: Married  ROS  Review of Systems  Cardiovascular:  Negative for chest pain, dyspnea on exertion and palpitations. Leg swelling: chronic, stable. Respiratory:  Negative for shortness of breath.   Gastrointestinal:  Negative for melena.   Objective  Blood pressure 124/88, pulse 71, temperature 98 F (36.7 C), resp. rate 16, height 6' (1.829 m), weight 175 lb (79.4 kg), SpO2 95 %. Body mass index is 23.73 kg/m.     11/19/2021    2:53 PM 10/30/2021    7:43 AM 10/30/2021    4:17 AM  Vitals with BMI  Height $Remov'6\' 0"'ldZQRH$     Weight 175 lbs    BMI 31.54    Systolic 008 98 98  Diastolic 88 68 64  Pulse 71 53 53     Physical Exam Vitals reviewed.  Neck:     Vascular: No carotid bruit or JVD.  Cardiovascular:     Rate and Rhythm: Normal rate and regular rhythm.     Pulses: Normal pulses and intact distal pulses.     Heart sounds: Murmur heard.     High-pitched blowing holosystolic murmur is present with a grade of 3/6 at the apex radiating to the axilla.     No gallop.  Pulmonary:     Effort: Pulmonary effort is normal.     Breath sounds: Normal breath sounds.  Musculoskeletal:     Right lower leg: Edema (trace edema. Varicose veins noted) present.     Left lower leg: Edema (trace edema. Varicose veins noted) present.      Laboratory examination:   Lab Results  Component Value Date   NA 138 10/30/2021   K 4.6 10/30/2021   CO2 23 10/30/2021   GLUCOSE 153 (H) 10/30/2021   BUN 19 10/30/2021   CREATININE 1.14 10/30/2021   CALCIUM 8.8 (L) 10/30/2021   EGFR 72 10/21/2021   GFRNONAA >60 10/30/2021        Latest Ref Rng & Units 10/30/2021    3:02 AM 10/21/2021    9:11 AM 09/11/2021   11:35 AM  CMP  Glucose 70 - 99 mg/dL 153  88  88   BUN 8 - 23 mg/dL $Remove'19  22  17   'Rylyixp$ Creatinine 0.61 - 1.24 mg/dL 1.14  1.11  1.03   Sodium 135 - 145 mmol/L 138  143  143   Potassium 3.5 - 5.1 mmol/L 4.6  3.9  4.1    Chloride 98 - 111 mmol/L 109  107  103   CO2 22 - 32 mmol/L $RemoveB'23  27  27   'XPAYlinX$ Calcium 8.9 - 10.3 mg/dL 8.8  9.6  9.4       Latest Ref Rng & Units 10/21/2021    9:11 AM 08/09/2021    1:41 AM 08/07/2021  8:15 PM  CBC  WBC 3.4 - 10.8 x10E3/uL 4.1  4.9    Hemoglobin 13.0 - 17.7 g/dL 15.2  14.2  15.3   Hematocrit 37.5 - 51.0 % 45.7  42.8  45.0   Platelets 150 - 450 x10E3/uL 181  186      Lab Results  Component Value Date   CHOL 144 08/08/2021   HDL 33 (L) 08/08/2021   LDLCALC 87 08/08/2021   TRIG 121 08/08/2021   CHOLHDL 4.4 08/08/2021     HEMOGLOBIN A1C Lab Results  Component Value Date   HGBA1C 5.5 08/07/2021   MPG 111.15 08/07/2021   TSH No results for input(s): "TSH" in the last 8760 hours.   External labs:    Medications and allergies   Allergies  Allergen Reactions   Losartan Other (See Comments)    Makes BP drop very low   Penicillins Other (See Comments)    Childhood reaction Has patient had a PCN reaction causing immediate rash, facial/tongue/throat swelling, SOB or lightheadedness with hypotension: yes Has patient had a PCN reaction causing severe rash involving mucus membranes or skin necrosis: no Has patient had a PCN reaction that required hospitalization: no Has patient had a PCN reaction occurring within the last 10 years: no If all of the above answers are "NO", then may proceed with Cephalosporin use.      Medication list after today's encounter    Current Outpatient Medications:    acetaminophen (TYLENOL) 500 MG tablet, Take 1,000 mg by mouth every 4 (four) hours as needed for moderate pain, headache or fever., Disp: , Rfl:    Cholecalciferol (VITAMIN D3 ULTRA STRENGTH) 125 MCG (5000 UT) capsule, Take 5,000 Units by mouth at bedtime., Disp: , Rfl:    dofetilide (TIKOSYN) 250 MCG capsule, TAKE 1 CAPSULE BY MOUTH 2 TIMES DAILY., Disp: 180 capsule, Rfl: 3   fluticasone (FLONASE) 50 MCG/ACT nasal spray, Place 2 sprays into both nostrils every evening. ,  Disp: , Rfl:    guaiFENesin (MUCINEX) 600 MG 12 hr tablet, Take 600 mg by mouth 2 (two) times daily as needed for cough., Disp: , Rfl:    magnesium oxide (MAG-OX) 400 (240 Mg) MG tablet, Take 400 mg by mouth at bedtime., Disp: , Rfl:    Menthol, Topical Analgesic, (BENGAY EX), Apply 1 Application topically 4 (four) times daily as needed (soreness/pain.)., Disp: , Rfl:    metoprolol succinate (TOPROL-XL) 50 MG 24 hr tablet, Take 1 tablet (50 mg total) by mouth daily. (Patient taking differently: Take 50 mg by mouth at bedtime.), Disp: 90 tablet, Rfl: 3   Povidone, PF, (IVIZIA DRY EYES) 0.5 % SOLN, Place 1 drop into both eyes in the morning and at bedtime. iVIZIA Lubricant Eye Gel for Severe and Nighttime Dry Eye Relief, Disp: , Rfl:    Probiotic Product (PROBIOTIC DAILY PO), Take 1 capsule by mouth in the morning. TruNature Advanced Digestive Probiotic, Disp: , Rfl:    rivaroxaban (XARELTO) 20 MG TABS tablet, Take 1 tablet (20 mg total) by mouth daily with supper., Disp: 30 tablet, Rfl: 5   rosuvastatin (CRESTOR) 10 MG tablet, Take 1 tablet (10 mg total) by mouth at bedtime., Disp: 30 tablet, Rfl: 5   zinc gluconate 50 MG tablet, Take 50 mg by mouth at bedtime., Disp: , Rfl:   Radiology:   No results found.  CT Angio Chest/Abd/Pelvis 08/19/2020: 1. Large right-sided perinephric hematoma with evidence of active extravasation from the midpole lower pole junction region laterally. The hematoma  measures approximately 11.5 x 10.0 x 9.0 cm with mass effect on the right kidney. No obvious underlying mass is identified. Recommend interventional radiology consultation. 2. No thoracic or abdominal aortic aneurysm or dissection. 3. Irregular and beaded appearance of the right renal arteries could not exclude FMD. There are 2 right and 2 left renal arteries. 4. No acute pulmonary findings or worrisome pulmonary lesions. 5. Aortic atherosclerosis.  ADDENDUM REPORT: 10/17/2020 15:15   ADDENDUM: I  reviewed this study for Dr. Adrian Prows. Concern for aortic root dilatation. There is moderate artifact through the region of the aortic root. The maximum diameter at the sinuses of Valsalva is 41.5 mm and the diameter at the sinotubular junction is 2.9 cm.   Cardiac Studies:   Coronary CTA 11/10/2017: 1. Calcium score 5 isolated area in mid LAD this is 26 th percentile for age and sex.  2.  Normal coronary arteries.  3.  Mild aortic root dilatation 4.0 cm.  PCV ECHOCARDIOGRAM COMPLETE 89/21/1941 Normal LV systolic function with visual EF 55-60%. Left ventricle cavity is normal in size. Normal left ventricular wall thickness. Normal global wall motion. Normal diastolic filling pattern, normal LAP. Left atrial cavity is normal in size. A lipomatous septum is present. Mild (Grade I) mitral regurgitation. Mild tricuspid regurgitation. Mild pulmonic regurgitation. The aortic root is mildly dilated (3.8cm). Proximal ascending aorta not well visualized. Compared to study 10/25/2018: no significant change.    EKG:   EKG 11/29/2021: Normal sinus rhythm at rate of 69 bpm, left atrial enlargement, normal axis.  No evidence of ischemia, normal EKG. no significant change from prior EKG 08/18/2021.    Assessment     ICD-10-CM   1. Paroxysmal atrial fibrillation (HCC)  I48.0 EKG 12-Lead    2. Essential hypertension  I10     3. MVP (mitral valve prolapse)  I34.1     4. Aortic root dilatation (HCC)  I77.810     5. Presence of Watchman Watchman FLX 5mm device 10/29/2021  Z95.818      There are no discontinued medications.    No orders of the defined types were placed in this encounter.  CHA2DS2-VASc Score is 4.  Yearly risk of stroke: 5% (A, HTN, CVA).  Score of 1=0.6; 2=2.2; 3=3.2; 4=4.8; 5=7.2; 6=9.8; 7=>9.8) -(CHF; HTN; vasc disease DM,  Male = 1; Age <65 =0; 65-74 = 1,  >75 =2; stroke/embolism= 2).   Recommendations:   Terry [name] is a 71 y.o. 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  Presently on Xarelto.  Underwent successful placement of Watchman device on 10/29/2021 without periprocedural complications.  He now presents for follow-up.  States that his neurologic deficits of dysarthria and word finding has improved significantly.  He has not had any palpitations.  No bleeding diathesis on Xarelto. He is doing well overall. Blood pressure is well controlled. Plan to continue current medications without changes at this time. We discussed continuing moderate exercise including walking and discouraged heavy weight lifting due to aortic root dilatation.  He is still experience difficulty with word finding post stroke. Discussed reading a book out loud for 5-10 minutes each day and completing simple math puzzles.  The patient has been scheduled for post procedure follow up with Kathyrn Drown, NP.  Medication plan post Watchman implant is to continue Xarelto for 45 days at which time he will transition to Plavix $RemoveB'75mg'[id number]$  QD for 6 months post implant. A repeat CT will be performed at 60 day post implant to ensure proper seal of the device.   I will see him back in 6 months, his aortic root dilatation and MVP with moderate mitral regurgitation needs to be closely monitored.  Depending upon structural heart team approach to imaging.  We will make questions regarding further imaging procedures.  I will have a low threshold to start him on an ARB in view of aortic root dilatation and prior stroke.  This was a 40-minute office visit encounter in evaluation of hospitalization records and coordination of care.   Adrian Prows, AGNP-C 11/19/2021, 11:28 PM Office: [phone] Fax:  [phone] Pager: [phone]   I have personally evaluated the patient and agree with the above assessment and plan.   Adrian Prows, MD, [facility] 11/19/2021, 11:28 PM Office: [phone] Fax: [phone] Pager: [phone]

## 2021-11-30 ENCOUNTER — Ambulatory Visit: Payer: PPO

## 2021-12-03 NOTE — Progress Notes (Unsigned)
HEART AND VASCULAR CENTER                                     Cardiology Office Note:    Date:  12/07/2021   ID:  Terry [name], DOB 27-May-1950, MRN [id number]  PCP:  Lawerance Cruel, MD  [facility] Cardiologist:  Mertie Moores, MD  [facility] Electrophysiologist:  Vickie Epley, MD   Referring MD: Lawerance Cruel, MD   Chief Complaint  Patient presents with   Follow-up    S/p Watchman   History of Present Illness:    Terry [name] is a 71 y.o. male with a hx of paroxysmal atrial fibrillation (dx 06/2015) s/p TEE/DCCV 08/2015, pericarditis, aortic root dilation, basal cell carcinoma, cardiomyopathy, central retinal vein occlusion, chronic systolic congestive heart failure, hypertension, hyperlipidemia, and hypokalemia.   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  At that time Eliquis was discontinued given relatively low CHA2DS2-VASc score and bleeding. He is followed with Dr. Irven Shelling team was was seen in follow up 01/21/2021 at which time he was stable from a cardiovascular standpoint and advised to follow-up in 1 year.   He unfortunately presented once again to [facility] with left-sided facial droop and expressive aphasia as well as weakness of the upper extremities on 08/07/2021. Imaging revealed right MCA infarct. Shared decision with neurology was to resume anticoagulation and stop antiplatelets given history of paroxysmal atrial fibrillation. He was seen in follow up by Lawerance Cruel, PA at which time he was noted to be doing well with no new neuro changes.    He was referred to EP for consideration of Watchman implant and was seen by Dr. Quentin Ore 09/10/21. He was reportedly compliant Tikosyn and had been tolerating Xarelto after restarting as outlined above. He underwent pre LAAO CT imaging that showed anatomy suitable for implant.   He underwent implant with Watchman FLX 23m  device on 10/29/21. He was restarted on Xarelto for 45 days (through 10/1) at which time he will transition to Plavix '75mg'$  QD (through 05/01/21) for 6 months post implant. Plan for re-imaging 12/31/21 to ensure proper seal of the device.   He presents today alone and states he has been doing well from a CV standpoint with no symptoms of chest pain, palpitations, SOB, LE edema, no bleeding in stool or urine, dizziness, or syncope.   Past Medical History:  Diagnosis Date   [place]    a. mild by CT 10/2017.   Basal cell carcinoma 10/02/2019   left hand bcc nod.   Cardiomyopathy ([facility]    Central retinal vein occlusion    Chronic systolic CHF (congestive heart failure) (HCC)    ED (erectile dysfunction)    H/O cardiac catheterization 2002 - normal cors   HTN (hypertension)    Hyperlipidemia    Hypokalemia    Leg edema    LV dysfunction    a. EF appeared mildly depressed by TEE 08/2015, no % given.   Mini stroke    a. ? Age [age]at time of central retinal vein occlusion - lost vision in eye   [place]    a. Dx 06/2015, s/p TEE/DCCV 08/2015.   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 LaQuentin Ore Syncope  Hx of thought to be due to orthostatic hypotension   Visit for monitoring Tikosyn therapy 01/17/2018   Past Surgical History:  Procedure Laterality Date   CARDIAC CATHETERIZATION  2002   CARDIOVERSION N/A 09/08/2015   Procedure: CARDIOVERSION;  Surgeon: Fay Records, MD;  Location: [place];  Service: Cardiovascular;  Laterality: N/A;   CARDIOVERSION N/A 06/28/2017   Procedure: CARDIOVERSION;  Surgeon: Jerline Pain, MD;  Location: [place] ENDOSCOPY;  Service: Cardiovascular;  Laterality: N/A;   HAND SURGERY  2003   KNEE SURGERY     LEFT ATRIAL APPENDAGE OCCLUSION N/A 10/29/2021   Procedure: LEFT ATRIAL APPENDAGE OCCLUSION;  Surgeon: Vickie Epley, MD;   Location: [place] CV LAB;  Service: Cardiovascular;  Laterality: N/A;   NASAL SINUS SURGERY  2003   x2. Scar tissue build up and had to recreate his tear duct   PERICARDIECTOMY     TEE WITHOUT CARDIOVERSION N/A 09/08/2015   Procedure: TRANSESOPHAGEAL ECHOCARDIOGRAM (TEE);  Surgeon: Fay Records, MD;  Location: [facility] ENDOSCOPY;  Service: Cardiovascular;  Laterality: N/A;   TEE WITHOUT CARDIOVERSION N/A 06/28/2017   Procedure: TRANSESOPHAGEAL ECHOCARDIOGRAM (TEE);  Surgeon: Jerline Pain, MD;  Location: [facility] ENDOSCOPY;  Service: Cardiovascular;  Laterality: N/A;   TEE WITHOUT CARDIOVERSION N/A 10/29/2021   Procedure: TRANSESOPHAGEAL ECHOCARDIOGRAM (TEE);  Surgeon: Vickie Epley, MD;  Location: [place] CV LAB;  Service: Cardiovascular;  Laterality: N/A;   Current Medications: Current Meds  Medication Sig   acetaminophen (TYLENOL) 500 MG tablet Take 1,000 mg by mouth every 4 (four) hours as needed for moderate pain, headache or fever.   Cholecalciferol (VITAMIN D3 ULTRA STRENGTH) 125 MCG (5000 UT) capsule Take 5,000 Units by mouth at bedtime.   clopidogrel (PLAVIX) 75 MG tablet Take 1 tablet (75 mg total) by mouth daily. STOP XARELTO ON 10/1 AND START PLAVIX ON 10/2   dofetilide (TIKOSYN) 250 MCG capsule TAKE 1 CAPSULE BY MOUTH 2 TIMES DAILY.   fluticasone (FLONASE) 50 MCG/ACT nasal spray Place 2 sprays into both nostrils every evening.    guaiFENesin (MUCINEX) 600 MG 12 hr tablet Take 600 mg by mouth 2 (two) times daily as needed for cough.   magnesium oxide (MAG-OX) 400 (240 Mg) MG tablet Take 400 mg by mouth at bedtime.   Menthol, Topical Analgesic, (BENGAY EX) Apply 1 Application topically 4 (four) times daily as needed (soreness/pain.).   metoprolol succinate (TOPROL-XL) 50 MG 24 hr tablet Take 1 tablet (50 mg total) by mouth daily.   Povidone, PF, (IVIZIA DRY EYES) 0.5 % SOLN Place 1 drop into both eyes in the morning and at bedtime. iVIZIA Lubricant Eye Gel for Severe and Nighttime  Dry Eye Relief   Probiotic Product (PROBIOTIC DAILY PO) Take 1 capsule by mouth in the morning. TruNature Advanced Digestive Probiotic   rivaroxaban (XARELTO) 20 MG TABS tablet Take 1 tablet (20 mg total) by mouth daily with supper.   rosuvastatin (CRESTOR) 10 MG tablet Take 1 tablet (10 mg total) by mouth at bedtime.   zinc gluconate 50 MG tablet Take 50 mg by mouth at bedtime.    Allergies:   Losartan and Penicillins   Social History   Socioeconomic History   Marital status: Married    Spouse name: Terry [name]   Number of children: Not on file   Years of education: Not on file   Highest education level: Not on file  Occupational History   Not on file  Tobacco Use   Smoking status: Never  Smokeless tobacco: Never  Vaping Use   Vaping Use: Never used  Substance and Sexual Activity   Alcohol use: Not Currently    Comment: 2-3 Times a Week Wine with Meals   Drug use: Never   Sexual activity: Not on file  Other Topics Concern   Not on file  Social History Narrative   ** Merged History Encounter **       Social Determinants of Health   Financial Resource Strain: Not on file  Food Insecurity: Not on file  Transportation Needs: Not on file  Physical Activity: Not on file  Stress: Not on file  Social Connections: Not on file   Family History: The patient's family history includes Atrial fibrillation in his brother and father; Cancer in his mother; Diabetes in his father and mother; Heart attack (age of onset: 40) in his brother; Heart disease in his father; Hypertension in his father and mother.  ROS:   Please see the history of present illness.    All other systems reviewed and are negative.  EKGs/Labs/Other Studies Reviewed:    The following studies were reviewed today:  Procedures This Admission:  Transeptal Puncture Intra-procedural TEE which showed no LAA thrombus Left atrial appendage occlusive device placement on 10/29/21 by Dr. Quentin Ore.   EKG:  EKG is not  ordered today.    Recent Labs: 08/09/2021: ALT 16; Magnesium 1.7 10/21/2021: Hemoglobin 15.2; Platelets 181 10/30/2021: BUN 19; Creatinine, Ser 1.14; Potassium 4.6; Sodium 138   Recent Lipid Panel    Component Value Date/Time   CHOL 144 08/08/2021 0558   CHOL 155 10/08/2019 0817   TRIG 121 08/08/2021 0558   HDL 33 (L) 08/08/2021 0558   HDL 36 (L) 10/08/2019 0817   CHOLHDL 4.4 08/08/2021 0558   VLDL 24 08/08/2021 0558   LDLCALC 87 08/08/2021 0558   LDLCALC 95 10/08/2019 0817   Physical Exam:    VS:  BP 110/60 (BP Location: Left Arm, Patient Position: Sitting, Cuff Size: Normal)   Pulse 67   Ht 6' (1.829 m)   Wt 164 lb (74.4 kg)   SpO2 96%   BMI 22.24 kg/m     Wt Readings from Last 3 Encounters:  12/07/21 164 lb (74.4 kg)  11/19/21 175 lb (79.4 kg)  10/29/21 171 lb 15.3 oz (78 kg)    General: Well developed, well nourished, NAD Lungs:Clear to ausculation bilaterally. No wheezes, rales, or rhonchi. Breathing is unlabored. Cardiovascular: RRR with S1 S2. No murmurs Extremities: No edema.  Neuro: Alert and oriented. No focal deficits. No facial asymmetry. MAE spontaneously. Psych: Responds to questions appropriately with normal affect.    ASSESSMENT/PLAN:    Paroxysmal atrial fibrillation: Underwent left atrial appendage occlusive device placement with Watchman FLX 68m device 10/29/21. Medication plan will be to continue Xarelto for 45 days (10/1) at which time he will transition to Plavix '75mg'$  QD for 6 months post implant (05/01/22). Plan repeat CT on 10/19. Obtain CBC, BMET.     Cardiomyopathy/ chronic systolic congestive heart failure: Asymptomatic with no evidence of fluid volume overload. No changes today   Hypertension: Stable, 110/60. No changes today.    Hyperlipidemia: Stable, last LDL 87 on 07/19/21.    Medication Adjustments/Labs and Tests Ordered: Current medicines are reviewed at length with the patient today.  Concerns regarding medicines are outlined above.   Orders Placed This Encounter  Procedures   Basic metabolic panel   CBC   Meds ordered this encounter  Medications   clopidogrel (PLAVIX) 75  MG tablet    Sig: Take 1 tablet (75 mg total) by mouth daily. STOP XARELTO ON 10/1 AND START PLAVIX ON 10/2    Dispense:  90 tablet    Refill:  3    Patient Instructions  Medication Instructions:  Your physician has recommended you make the following change in your medication:  STOP XARELTO ON 10/1 START PLAVIX ON 10/2  *If you need a refill on your cardiac medications before your next appointment, please call your pharmacy*   Lab Work: TODAY: BMET, CBC If you have labs (blood work) drawn today and your tests are completely normal, you will receive your results only by: [place] (if you have MyChart) OR A paper copy in the mail If you have any lab test that is abnormal or we need to change your treatment, we will call you to review the results.   Testing/Procedures: SEE CT INSTRUCTION LETTER   Follow-Up: At [facility], you and your health needs are our priority.  As part of our continuing mission to provide you with exceptional heart care, we have created designated Provider Care Teams.  These Care Teams include your primary Cardiologist (physician) and Advanced Practice Providers (APPs -  Physician Assistants and Nurse Practitioners) who all work together to provide you with the care you need, when you need it.  We recommend signing up for the patient portal called "MyChart".  Sign up information is provided on this After Visit Summary.  MyChart is used to connect with patients for Virtual Visits (Telemedicine).  Patients are able to view lab/test results, encounter notes, upcoming appointments, etc.  Non-urgent messages can be sent to your provider as well.   To learn more about what you can do with MyChart, go to [email or web address].    Your next appointment:   SOMEONE FROM [facility] WILL CALL YOU TO SCHEDULE A  FOLLOW-UP APPOINTMENT   Important Information About Sugar         Signed, Kathyrn Drown, NP  12/07/2021 6:06 PM    [facility]

## 2021-12-07 ENCOUNTER — Ambulatory Visit: Payer: PPO | Attending: Cardiology | Admitting: Cardiology

## 2021-12-07 VITALS — BP 110/60 | HR 67 | Ht 72.0 in | Wt 164.0 lb

## 2021-12-07 DIAGNOSIS — Z95818 Presence of other cardiac implants and grafts: Secondary | ICD-10-CM

## 2021-12-07 DIAGNOSIS — I429 Cardiomyopathy, unspecified: Secondary | ICD-10-CM

## 2021-12-07 DIAGNOSIS — E7800 Pure hypercholesterolemia, unspecified: Secondary | ICD-10-CM | POA: Diagnosis not present

## 2021-12-07 DIAGNOSIS — I5022 Chronic systolic (congestive) heart failure: Secondary | ICD-10-CM | POA: Diagnosis not present

## 2021-12-07 DIAGNOSIS — I4819 Other persistent atrial fibrillation: Secondary | ICD-10-CM

## 2021-12-07 MED ORDER — CLOPIDOGREL BISULFATE 75 MG PO TABS: 75.0000 mg | ORAL_TABLET | Freq: Every day | ORAL | 3 refills | Status: DC

## 2021-12-07 NOTE — Patient Instructions (Signed)
Medication Instructions:  Your physician has recommended you make the following change in your medication:  STOP XARELTO ON 10/1 START PLAVIX ON 10/2  *If you need a refill on your cardiac medications before your next appointment, please call your pharmacy*   Lab Work: TODAY: BMET, CBC If you have labs (blood work) drawn today and your tests are completely normal, you will receive your results only by: [place] (if you have MyChart) OR A paper copy in the mail If you have any lab test that is abnormal or we need to change your treatment, we will call you to review the results.   Testing/Procedures: SEE CT INSTRUCTION LETTER   Follow-Up: At [facility], you and your health needs are our priority.  As part of our continuing mission to provide you with exceptional heart care, we have created designated Provider Care Teams.  These Care Teams include your primary Cardiologist (physician) and Advanced Practice Providers (APPs -  Physician Assistants and Nurse Practitioners) who all work together to provide you with the care you need, when you need it.  We recommend signing up for the patient portal called "MyChart".  Sign up information is provided on this After Visit Summary.  MyChart is used to connect with patients for Virtual Visits (Telemedicine).  Patients are able to view lab/test results, encounter notes, upcoming appointments, etc.  Non-urgent messages can be sent to your provider as well.   To learn more about what you can do with MyChart, go to [email or web address].    Your next appointment:   SOMEONE FROM [facility] WILL CALL YOU TO SCHEDULE A FOLLOW-UP APPOINTMENT   Important Information About Sugar

## 2021-12-08 LAB — CBC
Hematocrit: 45.8 % (ref 37.5–51.0)
Hemoglobin: 15.3 g/dL (ref 13.0–17.7)
MCH: 30.1 pg (ref 26.6–33.0)
MCHC: 33.4 g/dL (ref 31.5–35.7)
MCV: 90 fL (ref 79–97)
Platelets: 209 10*3/uL (ref 150–450)
RBC: 5.08 x10E6/uL (ref 4.14–5.80)
RDW: 12.7 % (ref 11.6–15.4)
WBC: 6.9 10*3/uL (ref 3.4–10.8)

## 2021-12-08 LAB — BASIC METABOLIC PANEL
BUN/Creatinine Ratio: 19 (ref 10–24)
BUN: 20 mg/dL (ref 8–27)
CO2: 22 mmol/L (ref 20–29)
Calcium: 9.2 mg/dL (ref 8.6–10.2)
Chloride: 104 mmol/L (ref 96–106)
Creatinine, Ser: 1.04 mg/dL (ref 0.76–1.27)
Glucose: 85 mg/dL (ref 70–99)
Potassium: 4.3 mmol/L (ref 3.5–5.2)
Sodium: 143 mmol/L (ref 134–144)
eGFR: 78 mL/min/{1.73_m2} (ref >59)

## 2021-12-08 NOTE — Addendum Note (Signed)
Addended by: Harland German A on: 12/08/2021 11:55 AM   Modules accepted: Orders

## 2021-12-21 ENCOUNTER — Telehealth: Payer: Self-pay | Admitting: Cardiology

## 2021-12-21 NOTE — Telephone Encounter (Signed)
  Pt c/o medication issue:  1. Name of Medication: clopidogrel (PLAVIX) 75 MG tablet  2. How are you currently taking this medication (dosage and times per day)? Take 1 tablet (75 mg total) by mouth daily. STOP XARELTO ON 10/1 AND START PLAVIX ON 10/2  3. Are you having a reaction (difficulty breathing--STAT)? No   4. What is your medication issue?  Pt and his wife is at urgent care because pt bump into their kitchen counter and he has a bruise on his right side above his wait line in the back, about a size of his hand. He said there is no pain, but they are very concern if he is bleeding. They said the wait time is 2 hours and wanted to get recommendation from [place]. They said they will stay there until they get a call today

## 2021-12-21 NOTE — Telephone Encounter (Signed)
Pt called to report that they went to the Urgent Care but it was going to be a 2 1/2 hour wait and they have dinner plans... the pt had bumped his left buttocks area on the granite kitchen cabinet rounded corner last night... he did not break the skin and they noticed today after leaving the gym and when he took a shower that he had a large bruise the size of his wife's hand... he said there is no pain or edema.. he will try using a warm compress tonight and mark the size of it..they took a picture and will monitor it tomorrow and if larger or any new edema or any abnormal changes they will go back to the Urgent Care... the pts wife says that she did talk to his PCP office earlier and they can work him in tomorrow if needed.. so she will them back and have an appt made.

## 2021-12-22 DIAGNOSIS — Z6824 Body mass index (BMI) 24.0-24.9, adult: Secondary | ICD-10-CM | POA: Diagnosis not present

## 2021-12-22 DIAGNOSIS — T148XXA Other injury of unspecified body region, initial encounter: Secondary | ICD-10-CM | POA: Diagnosis not present

## 2021-12-30 ENCOUNTER — Telehealth (HOSPITAL_COMMUNITY): Payer: Self-pay | Admitting: *Deleted

## 2021-12-30 NOTE — Telephone Encounter (Signed)
Reaching out to patient to offer assistance regarding upcoming cardiac imaging study; pt verbalizes understanding of appt date/time, parking situation and where to check in, and verified current allergies; name and call back number provided for further questions should they arise  Gordy Clement RN [place] [facility] [phone] office [phone] cell  Patient aware to arrive at 10:30am.

## 2021-12-31 ENCOUNTER — Ambulatory Visit (HOSPITAL_COMMUNITY)
Admission: RE | Admit: 2021-12-31 | Discharge: 2021-12-31 | Disposition: A | Discharge status: Home / Self Care | Payer: PPO | Source: Ambulatory Visit | Attending: Cardiology | Admitting: Cardiology

## 2021-12-31 DIAGNOSIS — I4819 Other persistent atrial fibrillation: Secondary | ICD-10-CM | POA: Insufficient documentation

## 2021-12-31 DIAGNOSIS — Z95818 Presence of other cardiac implants and grafts: Secondary | ICD-10-CM | POA: Insufficient documentation

## 2021-12-31 MED ORDER — IOHEXOL 350 MG/ML SOLN
95.0000 mL | Freq: Once | INTRAVENOUS | Status: AC | PRN
  Administered 2021-12-31: 95 mL via INTRAVENOUS

## 2022-01-01 ENCOUNTER — Telehealth: Payer: Self-pay | Admitting: Cardiology

## 2022-01-01 NOTE — Telephone Encounter (Signed)
  HEART AND VASCULAR CENTER   MULTIDISCIPLINARY HEART TEAM   Pt underwent LAAO with Watchman 11/2021. Follow up CT imaging performed 12/31/21 that shows stable device placement with no leak and fully endothelialized with 15% compression. Called to share results and confirm 6 month follow up with Dr. Einar Gip with no answer. LVM.    Kathyrn Drown NP-C Structural Heart Team  Pager: [phone] Phone: [phone]

## 2022-01-05 ENCOUNTER — Telehealth: Payer: Self-pay | Admitting: Cardiology

## 2022-01-05 NOTE — Telephone Encounter (Signed)
Spoke with the patient about CT results with stable device placement wiuth no leak or thrombus. Continue Plavix '75mg'$ . Confirmed that we will call him at his 6 month s/p LAAO implant date 05/01/22 to stop Plavix. He has 6 already scheduled follow up with Dr. Irven Shelling team 05/26/22.   Kathyrn Drown NP-C Structural Heart Team  Pager: [phone] Phone: [phone]

## 2022-01-21 ENCOUNTER — Other Ambulatory Visit: Payer: PPO

## 2022-01-25 ENCOUNTER — Other Ambulatory Visit: Payer: Self-pay | Admitting: Cardiology

## 2022-01-25 DIAGNOSIS — I10 Essential (primary) hypertension: Secondary | ICD-10-CM

## 2022-01-26 DIAGNOSIS — Z125 Encounter for screening for malignant neoplasm of prostate: Secondary | ICD-10-CM | POA: Diagnosis not present

## 2022-01-27 ENCOUNTER — Telehealth: Payer: Self-pay

## 2022-01-27 MED ORDER — AZITHROMYCIN 500 MG PO TABS: 500.0000 mg | ORAL_TABLET | ORAL | 0 refills | Status: DC | PRN

## 2022-01-27 NOTE — Addendum Note (Signed)
Addended by: Harland German A on: 01/27/2022 12:10 PM   Modules accepted: Orders

## 2022-01-27 NOTE — Telephone Encounter (Signed)
Arbie Cookey from Dr. Ronnald Ramp' office called to see if the patient can get a dental cleaning. The patient had LAAO 10/29/2021. He can have dental cleaning and will need abx prior to all dental visits x6 months post-procedure (until 05/01/2022).  Confirmed Plavix will not need to be held.   Azithromycin 500 mg called in to patient's pharmacy to be taken 1 hour prior to dental cleaning.   Patient aware of abx called in to pharmacy.   Faxed to [facility] at [phone]

## 2022-01-29 ENCOUNTER — Ambulatory Visit: Payer: PPO | Admitting: Cardiology

## 2022-02-02 DIAGNOSIS — Z125 Encounter for screening for malignant neoplasm of prostate: Secondary | ICD-10-CM | POA: Diagnosis not present

## 2022-02-02 DIAGNOSIS — N281 Cyst of kidney, acquired: Secondary | ICD-10-CM | POA: Diagnosis not present

## 2022-02-02 DIAGNOSIS — N5201 Erectile dysfunction due to arterial insufficiency: Secondary | ICD-10-CM | POA: Diagnosis not present

## 2022-02-02 DIAGNOSIS — S37021S Major contusion of right kidney, sequela: Secondary | ICD-10-CM | POA: Diagnosis not present

## 2022-02-16 DIAGNOSIS — H6980 Other specified disorders of Eustachian tube, unspecified ear: Secondary | ICD-10-CM | POA: Diagnosis not present

## 2022-02-16 DIAGNOSIS — Z6823 Body mass index (BMI) 23.0-23.9, adult: Secondary | ICD-10-CM | POA: Diagnosis not present

## 2022-02-16 DIAGNOSIS — R059 Cough, unspecified: Secondary | ICD-10-CM | POA: Diagnosis not present

## 2022-02-16 DIAGNOSIS — R062 Wheezing: Secondary | ICD-10-CM | POA: Diagnosis not present

## 2022-03-23 ENCOUNTER — Ambulatory Visit: Payer: PPO | Admitting: Neurology

## 2022-03-24 DIAGNOSIS — E7800 Pure hypercholesterolemia, unspecified: Secondary | ICD-10-CM | POA: Diagnosis not present

## 2022-03-24 DIAGNOSIS — I10 Essential (primary) hypertension: Secondary | ICD-10-CM | POA: Diagnosis not present

## 2022-03-31 DIAGNOSIS — D485 Neoplasm of uncertain behavior of skin: Secondary | ICD-10-CM | POA: Diagnosis not present

## 2022-03-31 DIAGNOSIS — Z0000 Encounter for general adult medical examination without abnormal findings: Secondary | ICD-10-CM | POA: Diagnosis not present

## 2022-03-31 DIAGNOSIS — I10 Essential (primary) hypertension: Secondary | ICD-10-CM | POA: Diagnosis not present

## 2022-03-31 DIAGNOSIS — E7800 Pure hypercholesterolemia, unspecified: Secondary | ICD-10-CM | POA: Diagnosis not present

## 2022-03-31 DIAGNOSIS — Z6824 Body mass index (BMI) 24.0-24.9, adult: Secondary | ICD-10-CM | POA: Diagnosis not present

## 2022-04-10 DIAGNOSIS — Z1211 Encounter for screening for malignant neoplasm of colon: Secondary | ICD-10-CM | POA: Diagnosis not present

## 2022-04-10 DIAGNOSIS — Z1212 Encounter for screening for malignant neoplasm of rectum: Secondary | ICD-10-CM | POA: Diagnosis not present

## 2022-04-15 ENCOUNTER — Encounter: Payer: Self-pay | Admitting: Neurology

## 2022-04-15 ENCOUNTER — Ambulatory Visit: Payer: PPO | Admitting: Neurology

## 2022-04-15 VITALS — BP 118/74 | HR 72 | Ht 72.0 in | Wt 164.0 lb

## 2022-04-15 DIAGNOSIS — I63411 Cerebral infarction due to embolism of right middle cerebral artery: Secondary | ICD-10-CM

## 2022-04-15 DIAGNOSIS — I480 Paroxysmal atrial fibrillation: Secondary | ICD-10-CM

## 2022-04-15 DIAGNOSIS — Z95818 Presence of other cardiac implants and grafts: Secondary | ICD-10-CM

## 2022-04-15 NOTE — Progress Notes (Addendum)
Patient: Terry [name] Date of Birth: [birth year]  Reason for Visit: Follow up stroke 08/07/21 History from: Patient, wife, daughter  Primary Neurologist: Dr. Leonie Man (Saw Dr. Erlinda Hong)  ASSESSMENT AND PLAN 72 y.o. year old male   71.  Stroke: Right MCA small patchy infarcts -Embolic, likely secondary to PAF not on anticoagulation at the time of CVA -Has been excellent, has some minor speech deficits, otherwise is back to normal, remains highly functional, exercises, very active  2.  Paroxysmal atrial fibrillation -Watchman device placed 10/29/21 with Dr. Quentin Ore, currently on Plavix until 05/01/22 post-implant -Was on Xarelto prior, and post implant for 45 days -Decision for anticoagulation has been carefully considered given history of spontaneous retroperitoneal bleed in June 2022 -Will defer to cardiology about plan for aspirin vs plavix post-watchman, in the setting of post stroke  3.  Hypertension -At goal, < 130/90  4.  Hyperlipidemia -Goal LDL less than 70, follow-up with PCP about labs -On Crestor 10 mg daily  Overall, he seems to be doing very well, is highly functional, independent, and active.  We have discussed the importance of social interaction, keeping a schedule.  He is very committed to his exercise schedule.  I will check with Dr. Leonie Man regarding any need for aspirin or Plavix once completed with the watchman protocol.  He is currently scheduled to stop Plavix 05/01/22, while considering his history spontaneous retroperitoneal bleed in June 2022.  His wife and daughter are very knowledgeable.  They closely observe him.  They will return here on an as-needed basis  HISTORY OF PRESENT ILLNESS: Today 04/15/22 He had Watchman device placed 10/29/21 restarted Xarelto for 45 days then will transition to Plavix 75 mg daily through 05/01/22. Here with his wife and daughter. Still some mild cognitive delay with processing, expression. Has some hearing loss, waiting for hearing aids.  Speech is sometimes mumbling, not as clear per family. Some of his impairments may be related to hearing deficit. His voice tone is soft. Is completed with ST. Is intentional to speak slowly. He has some residual numbness to left thumb, otherwise is back to normal. Left side is strong, he works out for hours a day, doing [employer] cardio at [employer]. He does the grocery shopping, [place] a car, takes his wife to and from work. Has been retired for about 1.5 years. Has had some trouble keeping a schedule. He speaks french.   09/03/21 SS: Hyrum Shaneyfelt is here today for stroke clinic follow-up for right frontal and occipital lobe infarcts 08/07/21, presented as code stroke with acute onset slurred speech, COVID-positive (day #8, symptoms had resolved), no tPA given due to mild sx, history of retroperitoneal hemorrhage.  He was previously on Eliquis for PAF, in June 2022 he had acute severe retroperitoneal hemorrhage, Eliquis was discontinued, not on any antithrombotic since.  Consultation with neurology, cardiology, patient and family, decided to discontinue aspirin and Plavix in the hospital, restart Xarelto 08/08/21.  Here today with his wife, daughter.  He is involved in speech therapy, improved, speech is 90% back to normal, has mild left-sided facial droop, slight dysarthria, but is largely clearly understandable.  Reportedly slight numbness to his left fingertips.  When eating, sometimes drools to the left side.  Has consultation with cardiology next week for consideration of Watchman device.  He remains on Xarelto.  No signs of bleeding thus far.  Also on Crestor.  He enjoys physical activity, has been limiting his exercise since stroke.  He is being cautious.  He very much enjoys spin classes.  He is retired.  For at least a year, he has had some mild memory issues with short term noted by his family. No new issues or concerns.  -CT head showed no acute abnormality -CT head and neck was  unremarkable -MRI of the brain small acute and/or subacute infarcts in the right frontal lobe and right occipital lobe -2D echo EF 60 to 65% -LDL 87 -A1c 5.5 -Not on antithrombotic prior to admission  HISTORY  Copied Dr. Candiss Norse 08/07/21 HPI: Terry [name] is a 72 y.o. male with medical history significant of CAD, paroxysmal Afib no longer on anticoagulation due to history of spontaneous retroperitoneal bleed in June 2022, cardiomyopathy, history of central retinal venous occlusion, HTN, HLD presented to the ED as code stroke for evaluation of acute onset slurred speech, LKW at 7:15 pm on 08/07/2021.  Patient tested positive for COVID 8 days ago.  tPA not given due to mild symptoms and history of spontaneous retroperitoneal bleed.  CT head negative for acute finding.  CTA head and neck negative for LVO.  Brain MRI showing small acute and/or subacute infarcts in the right frontal lobe and right occipital lobe.  Labs significant for potassium 5.8, bicarb 20.  T. bili 1.9, remainder of LFTs normal.  A1c 5.5.  Repeat labs showing potassium 5.4, bicarb 26.  UA without signs of infection.  UDS negative.  SARS-CoV-2 PCR test positive.   History provided by patient and his wife at bedside.  Yesterday at dinnertime around 7 PM he had acute onset slurred speech with left-sided facial droop and mild left upper extremity weakness.  He was having fevers and diarrhea, diagnosed with COVID 8 days ago but symptoms have now resolved.  Denies cough or shortness of breath.  Reports history of retroperitoneal bleed in June 2022 related to anticoagulation use for A-fib.  REVIEW OF SYSTEMS: Out of a complete 14 system review of symptoms, the patient complains only of the following symptoms, and all other reviewed systems are negative.  See HPI  ALLERGIES: Allergies  Allergen Reactions   Losartan Other (See Comments)    Makes BP drop very low   Penicillins Other (See Comments)    Childhood reaction Has patient had a  PCN reaction causing immediate rash, facial/tongue/throat swelling, SOB or lightheadedness with hypotension: yes Has patient had a PCN reaction causing severe rash involving mucus membranes or skin necrosis: no Has patient had a PCN reaction that required hospitalization: no Has patient had a PCN reaction occurring within the last 10 years: no If all of the above answers are "NO", then may proceed with Cephalosporin use.     HOME MEDICATIONS: Outpatient Medications Prior to Visit  Medication Sig Dispense Refill   acetaminophen (TYLENOL) 500 MG tablet Take 1,000 mg by mouth every 4 (four) hours as needed for moderate pain, headache or fever.     azithromycin (ZITHROMAX) 500 MG tablet Take 1 tablet (500 mg total) by mouth as needed (take one 500 mg tablet 1 hour prior to all dental visits until 05/01/2022.). 2 tablet 0   Cholecalciferol (VITAMIN D3 ULTRA STRENGTH) 125 MCG (5000 UT) capsule Take 5,000 Units by mouth at bedtime.     clopidogrel (PLAVIX) 75 MG tablet Take 1 tablet (75 mg total) by mouth daily. STOP XARELTO ON 10/1 AND START PLAVIX ON 10/2 90 tablet 3   dofetilide (TIKOSYN) 250 MCG capsule TAKE 1 CAPSULE BY MOUTH 2 TIMES DAILY. 180 capsule 3   fluticasone (FLONASE)  50 MCG/ACT nasal spray Place 2 sprays into both nostrils every evening.      guaiFENesin (MUCINEX) 600 MG 12 hr tablet Take 600 mg by mouth 2 (two) times daily as needed for cough.     magnesium oxide (MAG-OX) 400 (240 Mg) MG tablet Take 400 mg by mouth at bedtime.     Menthol, Topical Analgesic, (BENGAY EX) Apply 1 Application topically 4 (four) times daily as needed (soreness/pain.).     metoprolol succinate (TOPROL-XL) 50 MG 24 hr tablet TAKE 1 TABLET BY MOUTH EVERY DAY 90 tablet 3   Povidone, PF, (IVIZIA DRY EYES) 0.5 % SOLN Place 1 drop into both eyes in the morning and at bedtime. iVIZIA Lubricant Eye Gel for Severe and Nighttime Dry Eye Relief     Probiotic Product (PROBIOTIC DAILY PO) Take 1 capsule by mouth in the  morning. TruNature Advanced Digestive Probiotic     rosuvastatin (CRESTOR) 10 MG tablet Take 1 tablet (10 mg total) by mouth at bedtime. 30 tablet 5   zinc gluconate 50 MG tablet Take 50 mg by mouth at bedtime.     No facility-administered medications prior to visit.    PAST MEDICAL HISTORY: Past Medical History:  Diagnosis Date   Aortic root dilation ([place])    a. mild by CT 10/2017.   Basal cell carcinoma 10/02/2019   left hand bcc nod.   Cardiomyopathy [facility])    Central retinal vein occlusion    Chronic systolic CHF (congestive heart failure) (H[facility])    ED (erectile dysfunction)    H/O cardiac catheterization 2002 - normal cors   HTN (hypertension)    Hyperlipidemia    Hypokalemia    Leg edema    LV dysfunction    a. EF appeared mildly depressed by TEE 08/2015, no % given.   Mini stroke    a. ? Age 58 at time of central retinal vein occlusion - lost vision in eye   PAF (paroxysmal atrial fibrillation) ([place])    a. Dx 06/2015, s/p TEE/DCCV 08/2015.   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 LQuentin Ore  Syncope    Hx of thought to be due to orthostatic hypotension   Visit for monitoring Tikosyn therapy 01/17/2018    PAST SURGICAL HISTORY: Past Surgical History:  Procedure Laterality Date   CARDIAC CATHETERIZATION  2002   CARDIOVERSION N/A 09/08/2015   Procedure: CARDIOVERSION;  Surgeon: PFay Records MD;  [facility]  Service: Cardiovascular;  Laterality: N/A;   CARDIOVERSION N/A 06/28/2017   Procedure: CARDIOVERSION;  Surgeon: SJerline Pain MD;  [facility];  Service: Cardiovascular;  Laterality: N/A;   HAND SURGERY  2003   KNEE SURGERY     LEFT ATRIAL APPENDAGE OCCLUSION N/A 10/29/2021   Procedure: LEFT ATRIAL APPENDAGE OCCLUSION;  Surgeon: LVickie Epley MD;  [facility];  Service: Cardiovascular;  Laterality: N/A;   NASAL SINUS SURGERY  2003   x2. Scar  tissue build up and had to recreate his tear duct   PERICARDIECTOMY     TEE WITHOUT CARDIOVERSION N/A 09/08/2015   Procedure: TRANSESOPHAGEAL ECHOCARDIOGRAM (TEE);  Surgeon: PFay Records MD;  [facility];  Service: Cardiovascular;  Laterality: N/A;   TEE WITHOUT CARDIOVERSION N/A 06/28/2017   Procedure: TRANSESOPHAGEAL ECHOCARDIOGRAM (TEE);  Surgeon: SJerline Pain MD;  [facility];  Service: Cardiovascular;  Laterality: N/A;   TEE WITHOUT CARDIOVERSION N/A  10/29/2021   Procedure: TRANSESOPHAGEAL ECHOCARDIOGRAM (TEE);  Surgeon: Vickie Epley, MD;  Location: [place] [facility];  Service: Cardiovascular;  Laterality: N/A;    FAMILY HISTORY: Family History  Problem Relation Age of Onset   Hypertension Mother    Diabetes Mother    Cancer Mother    Atrial fibrillation Father    Heart disease Father        had pacemaker   Hypertension Father    Diabetes Father    Atrial fibrillation Brother    Heart attack Brother 31    SOCIAL HISTORY: Social History   Socioeconomic History   Marital status: Married    Spouse name: Monia Pouch   Number of children: Not on file   Years of education: Not on file   Highest education level: Not on file  Occupational History   Not on file  Tobacco Use   Smoking status: Never   Smokeless tobacco: Never  Vaping Use   Vaping Use: Never used  Substance and Sexual Activity   Alcohol use: Not Currently    Comment: 2-3 Times a Week Wine with Meals   Drug use: Never   Sexual activity: Not on file  Other Topics Concern   Not on file  Social History Narrative   ** Merged History Encounter **       Social Determinants of Health   Financial Resource Strain: Not on file  Food Insecurity: Not on file  Transportation Needs: Not on file  Physical Activity: Not on file  Stress: Not on file  Social Connections: Not on file  Intimate Partner Violence: Not on file   PHYSICAL EXAM  Vitals:   04/15/22 1404  BP: 118/74  Pulse:  72  Weight: 164 lb (74.4 kg)  Height: 6' (1.829 m)    Body mass index is 22.24 kg/m.  Generalized: Well developed, in no acute distress  Cardiac: Regular Rhythm  Neurological examination  Mentation: Alert oriented to time, place, history taking. Follows all commands, language is fluent, with speech, few times noted to slur a random word, take speech pause, underlying accent  Cranial nerve II-XII: Pupils were equal round reactive to light. Extraocular movements were full, visual field were full on confrontational test. Facial sensation and strength were normal. Head turning and shoulder shrug were normal and symmetric. No facial droop noted today. Motor: The motor testing reveals 5 over 5 strength of all 4 extremities. Good symmetric motor tone is noted throughout.  Sensory: Sensory testing is intact to soft touch on all 4 extremities. No evidence of extinction is noted.  Coordination: Cerebellar testing reveals good finger-nose-finger and heel-to-shin bilaterally.  Gait and station: Gait is normal. Tandem gait is normal.  Reflexes: Deep tendon reflexes are symmetric and normal bilaterally.   DIAGNOSTIC DATA (LABS, IMAGING, TESTING) - I reviewed patient records, labs, notes, testing and imaging myself where available.  Lab Results  Component Value Date   WBC 6.9 12/07/2021   HGB 15.3 12/07/2021   HCT 45.8 12/07/2021   MCV 90 12/07/2021   PLT 209 12/07/2021      Component Value Date/Time   NA 143 12/07/2021 1225   K 4.3 12/07/2021 1225   CL 104 12/07/2021 1225   CO2 22 12/07/2021 1225   GLUCOSE 85 12/07/2021 1225   GLUCOSE 153 (H) 10/30/2021 0302   BUN 20 12/07/2021 1225   CREATININE 1.04 12/07/2021 1225   CREATININE 1.01 01/30/2016 1606   CALCIUM 9.2 12/07/2021 1225   PROT 5.3 (L) 08/09/2021  0141   PROT 5.9 (L) 10/08/2019 0817   ALBUMIN 2.9 (L) 08/09/2021 0141   ALBUMIN 4.0 10/08/2019 0817   AST 15 08/09/2021 0141   ALT 16 08/09/2021 0141   ALKPHOS 41 08/09/2021 0141    BILITOT 0.7 08/09/2021 0141   BILITOT 1.0 10/08/2019 0817   GFRNONAA >60 10/30/2021 0302   GFRAA 89 05/02/2020 0726   Lab Results  Component Value Date   CHOL 144 08/08/2021   HDL 33 (L) 08/08/2021   LDLCALC 87 08/08/2021   TRIG 121 08/08/2021   CHOLHDL 4.4 08/08/2021   Lab Results  Component Value Date   HGBA1C 5.5 08/07/2021   No results found for: "VITAMINB12" Lab Results  Component Value Date   TSH 3.390 05/31/2017    Butler Denmark, AGNP-C, DNP 04/15/2022, 2:14 PM [facility] [address], [place], [place] [address] [phone]

## 2022-04-15 NOTE — Patient Instructions (Signed)
Great to see you today Continue to manage vascular risk factors, goal BP < 130/90, A1C < 7.0, LDL < 70 Healthy eating, drinking plenty of water  Continue to work with cardiology in regards to [place] Work on social interaction, brain stimulating activity Keep close follow up with your primary care doctor See you back as needed

## 2022-04-20 ENCOUNTER — Encounter: Payer: Self-pay | Admitting: Cardiology

## 2022-04-20 DIAGNOSIS — E7800 Pure hypercholesterolemia, unspecified: Secondary | ICD-10-CM

## 2022-04-21 NOTE — Telephone Encounter (Signed)
From patient

## 2022-04-22 ENCOUNTER — Telehealth: Payer: Self-pay | Admitting: Cardiology

## 2022-04-22 ENCOUNTER — Other Ambulatory Visit: Payer: Self-pay | Admitting: Cardiology

## 2022-04-22 MED ORDER — ASPIRIN 81 MG PO TBEC: 81.0000 mg | DELAYED_RELEASE_TABLET | Freq: Every day | ORAL | 2 refills | Status: AC

## 2022-04-22 NOTE — Telephone Encounter (Signed)
  [place] [facility]  [facility]  Patient underwent LAAO closure with Watchman 10/29/21. He was contacted regarding 6 month medication changes. He was instructed to continue Plavix until 05/01/22 then will stop and start ASA '81mg'$  QD. He will no longer require dental SBE after 2/17. I will update his medication list to reflect these changes.   Kathyrn Drown NP-C Structural Heart Team  Pager: [phone] Phone: [phone]

## 2022-04-26 ENCOUNTER — Encounter: Payer: Self-pay | Admitting: Neurology

## 2022-04-27 NOTE — Telephone Encounter (Signed)
From patient

## 2022-04-28 DIAGNOSIS — D485 Neoplasm of uncertain behavior of skin: Secondary | ICD-10-CM | POA: Diagnosis not present

## 2022-04-28 DIAGNOSIS — C44612 Basal cell carcinoma of skin of right upper limb, including shoulder: Secondary | ICD-10-CM | POA: Diagnosis not present

## 2022-04-28 DIAGNOSIS — D1801 Hemangioma of skin and subcutaneous tissue: Secondary | ICD-10-CM | POA: Diagnosis not present

## 2022-04-28 DIAGNOSIS — D229 Melanocytic nevi, unspecified: Secondary | ICD-10-CM | POA: Diagnosis not present

## 2022-04-28 DIAGNOSIS — C44319 Basal cell carcinoma of skin of other parts of face: Secondary | ICD-10-CM | POA: Diagnosis not present

## 2022-04-28 DIAGNOSIS — L814 Other melanin hyperpigmentation: Secondary | ICD-10-CM | POA: Diagnosis not present

## 2022-04-28 DIAGNOSIS — L570 Actinic keratosis: Secondary | ICD-10-CM | POA: Diagnosis not present

## 2022-04-28 DIAGNOSIS — L821 Other seborrheic keratosis: Secondary | ICD-10-CM | POA: Diagnosis not present

## 2022-04-28 DIAGNOSIS — L578 Other skin changes due to chronic exposure to nonionizing radiation: Secondary | ICD-10-CM | POA: Diagnosis not present

## 2022-04-28 MED ORDER — EZETIMIBE 10 MG PO TABS: 10.0000 mg | ORAL_TABLET | Freq: Every day | ORAL | 3 refills | Status: DC

## 2022-04-28 MED ORDER — ROSUVASTATIN CALCIUM 10 MG PO TABS: 10.0000 mg | ORAL_TABLET | ORAL | 5 refills | Status: DC

## 2022-04-28 NOTE — Telephone Encounter (Signed)
ADD ZETIA?

## 2022-04-28 NOTE — Telephone Encounter (Signed)
ICD-10-CM   1. Pure hypercholesterolemia  E78.00 rosuvastatin (CRESTOR) 10 MG tablet    ezetimibe (ZETIA) 10 MG tablet     Meds ordered this encounter  Medications   rosuvastatin (CRESTOR) 10 MG tablet    Sig: Take 1 tablet (10 mg total) by mouth every other day.    Dispense:  30 tablet    Refill:  5   ezetimibe (ZETIA) 10 MG tablet    Sig: Take 1 tablet (10 mg total) by mouth daily.    Dispense:  90 tablet    Refill:  3

## 2022-04-30 ENCOUNTER — Telehealth: Payer: Self-pay | Admitting: Cardiology

## 2022-04-30 NOTE — Telephone Encounter (Signed)
Pt c/o medication issue:  1. Name of Medication:   aspirin EC 81 MG tablet  clopidogrel (PLAVIX) tablet 75 mg     2. How are you currently taking this medication (dosage and times per day)? As prescribed  3. Are you having a reaction (difficulty breathing--STAT)?   4. What is your medication issue?   Wife stated patient will be stopping Plavix tomorrow and get on aspirin.  Wife is concerned regarding these medication changes.

## 2022-04-30 NOTE — Telephone Encounter (Signed)
Spoke with the patient and his wife who state that the patient has had some cognitive decline recently. He did go to see a neurologist but they did not do any testing. Wife states that the patient has become more forgetful and confused. They are concerned about possible mini strokes. Patient has watchman placed 10/29/21. He has been taking Plavix as prescribed. He is supposed to switch to ASA tomorrow but is nervous about getting off of the Plavix.  He has reached out to his PCP and is awaiting a call back to discuss concerns and further neuro work up.

## 2022-05-02 DIAGNOSIS — B349 Viral infection, unspecified: Secondary | ICD-10-CM | POA: Diagnosis not present

## 2022-05-02 DIAGNOSIS — J209 Acute bronchitis, unspecified: Secondary | ICD-10-CM | POA: Diagnosis not present

## 2022-05-03 DIAGNOSIS — R4789 Other speech disturbances: Secondary | ICD-10-CM | POA: Diagnosis not present

## 2022-05-03 DIAGNOSIS — R5382 Chronic fatigue, unspecified: Secondary | ICD-10-CM | POA: Diagnosis not present

## 2022-05-03 DIAGNOSIS — Z6824 Body mass index (BMI) 24.0-24.9, adult: Secondary | ICD-10-CM | POA: Diagnosis not present

## 2022-05-06 ENCOUNTER — Other Ambulatory Visit: Payer: Self-pay

## 2022-05-06 DIAGNOSIS — I480 Paroxysmal atrial fibrillation: Secondary | ICD-10-CM

## 2022-05-06 MED ORDER — DOFETILIDE 250 MCG PO CAPS: 250.0000 ug | ORAL_CAPSULE | Freq: Two times a day (BID) | ORAL | 3 refills | Status: DC

## 2022-05-11 DIAGNOSIS — C4491 Basal cell carcinoma of skin, unspecified: Secondary | ICD-10-CM | POA: Diagnosis not present

## 2022-05-13 DIAGNOSIS — C44319 Basal cell carcinoma of skin of other parts of face: Secondary | ICD-10-CM | POA: Diagnosis not present

## 2022-05-18 NOTE — Telephone Encounter (Signed)
Spoke with the patient at length about his medications.  Reiterated to him that for the Watchman procedure specifically, he was to stop Plavix 2/17 and start ASA 81 mg daily (see 04/22/2022 phone note with instructions). The patient states he never stopped his Plavix.  He spoke with Dr. Einar Gip via [place] (see 2/6 messages) and Dr. Einar Gip told him he could continue Plavix OR ASA 81 mg if recommended by Neurology.  Informed the patient that a message will be sent to Butler Denmark for clarification and follow-up. If the patient needs to be on Plavix from a Neuro perspective, they will call him to confirm and take over prescribing the medication.  If OK from Neuro perspective to switch to ASA, I will call him to confirm.  He was grateful for assistance.

## 2022-05-20 NOTE — Telephone Encounter (Signed)
The patient called to follow-up. Informed the patient that per Butler Denmark and Dr. Leonie Man, it is OK to STOP PLAVIX and START ASA 81 mg daily. He was grateful for clarification and agreed with plan.    Valetta Fuller, Geophysical data processor dial: [phone]

## 2022-05-26 ENCOUNTER — Ambulatory Visit: Payer: PPO | Admitting: Cardiology

## 2022-05-26 ENCOUNTER — Encounter: Payer: Self-pay | Admitting: Cardiology

## 2022-05-26 VITALS — BP 99/61 | HR 64 | Resp 17 | Ht 72.0 in | Wt 175.0 lb

## 2022-05-26 DIAGNOSIS — I340 Nonrheumatic mitral (valve) insufficiency: Secondary | ICD-10-CM | POA: Diagnosis not present

## 2022-05-26 DIAGNOSIS — I77810 Thoracic aortic ectasia: Secondary | ICD-10-CM

## 2022-05-26 DIAGNOSIS — Z95818 Presence of other cardiac implants and grafts: Secondary | ICD-10-CM

## 2022-05-26 DIAGNOSIS — I480 Paroxysmal atrial fibrillation: Secondary | ICD-10-CM | POA: Diagnosis not present

## 2022-05-26 DIAGNOSIS — Z79899 Other long term (current) drug therapy: Secondary | ICD-10-CM

## 2022-05-26 NOTE — Progress Notes (Signed)
Primary Physician/Referring:  Lawerance Cruel, MD  Patient ID: Terry [name], male    DOB: 1950/10/14, 72 y.o.   MRN: [id number]  Chief Complaint  Patient presents with   Mitral Valve Prolapse   Atrial Fibrillation   Follow-up    HPI:    Terry [name]  is a 72 y.o. 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  Patient had LAA occlusion by implantation of Watchman FLX 24 mm device.  This is a 14-monthoffice visit.  States that his neurologic deficits of dysarthria and word finding has essentially resolved, however he still feels "brain fog".  He has not had any palpitations.  Past Medical History:  Diagnosis Date   [place]    a. mild by CT 10/2017.   Basal cell carcinoma 10/02/2019   left hand bcc nod.   Cardiomyopathy ([facility]    Central retinal vein occlusion    Chronic systolic CHF (congestive heart failure) (HCC)    ED (erectile dysfunction)    H/O cardiac catheterization 2002 - normal cors   HTN (hypertension)    Hyperlipidemia    Hypokalemia    Leg edema    LV dysfunction    a. EF appeared mildly depressed by TEE 08/2015, no % given.   Mini stroke    a. ? Age [age]at time of central retinal vein occlusion - lost vision in eye   PAF (paroxysmal atrial fibrillation) ([facility]    a. Dx 06/2015, s/p TEE/DCCV 08/2015.   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 LaQuentin Ore Syncope    Hx of thought to be due to orthostatic hypotension   Visit for monitoring Tikosyn therapy 01/17/2018   Past Surgical History:  Procedure Laterality Date   CARDIAC CATHETERIZATION  2002   CARDIOVERSION N/A 09/08/2015   Procedure:  CARDIOVERSION;  Surgeon: PaFay RecordsMD;  [facility]: Cardi[facility];  Laterality: N/A;   CARDIOVERSION N/A 06/28/2017   Procedure: CARDIOVERSION;  Surgeon: SkJerline PainMD;  [facility];  Service: Cardiovascular;  Laterality: N/A;   HAND SURGERY  2003   KNEE SURGERY     LEFT ATRIAL APPENDAGE OCCLUSION N/A 10/29/2021   Procedure: LEFT ATRIAL APPENDAGE OCCLUSION;  Surgeon: LaVickie EpleyMD;  [facility];  Service: Cardiovascular;  Laterality: N/A;   NASAL SINUS SURGERY  2003   x2. Scar tissue build up and had to recreate his tear duct   PERICARDIECTOMY     TEE WITHOUT CARDIOVERSION N/A 09/08/2015   Procedure: TRANSESOPHAGEAL ECHOCARDIOGRAM (TEE);  Surgeon: PaFay RecordsMD;  [facility];  Service: Cardiovascular;  Laterality: N/A;   TEE WITHOUT CARDIOVERSION N/A 06/28/2017   Procedure: TRANSESOPHAGEAL ECHOCARDIOGRAM (TEE);  Surgeon: SkJerline PainMD;  [facility];  Service: Cardiovascular;  Laterality: N/A;   TEE WITHOUT CARDIOVERSION N/A 10/29/2021   Procedure: TRANSESOPHAGEAL ECHOCARDIOGRAM (TEE);  Surgeon: LaVickie EpleyMD;  [place];  Service: Cardiovascular;  Laterality: N/A;   Family History  Problem Relation Age of Onset   Hypertension Mother    Diabetes Mother    Cancer Mother    Atrial fibrillation Father  Heart disease Father        had pacemaker   Hypertension Father    Diabetes Father    Atrial fibrillation Brother    Heart attack Brother 49    Social History   Tobacco Use   Smoking status: Never   Smokeless tobacco: Never  Substance Use Topics   Alcohol use: Not Currently    Comment: 2-3 Times a Week Wine with Meals   Marital Status: Married  ROS  Review of Systems  Cardiovascular:  Negative for chest pain, dyspnea on exertion and leg swelling.   Objective  Blood pressure 99/61, pulse 64, resp. rate 17, height 6' (1.829 m), weight 175 lb (79.4 kg), SpO2  94 %. Body mass index is 23.73 kg/m.     05/26/2022    2:28 PM 04/15/2022    2:04 PM 12/31/2021   10:38 AM  Vitals with BMI  Height '6\' 0"'$  '6\' 0"'$    Weight 175 lbs 164 lbs   BMI 0000000 0000000   Systolic 99 123456 0000000  Diastolic 61 74 90  Pulse 64 72 48     Physical Exam Vitals reviewed.  Neck:     Vascular: No carotid bruit or JVD.  Cardiovascular:     Rate and Rhythm: Normal rate and regular rhythm.     Pulses: Normal pulses and intact distal pulses.     Heart sounds: Murmur heard.     High-pitched blowing holosystolic murmur is present with a grade of 3/6 at the apex radiating to the axilla.     No gallop.  Pulmonary:     Effort: Pulmonary effort is normal.     Breath sounds: Normal breath sounds.  Abdominal:     General: Bowel sounds are normal.     Palpations: Abdomen is soft.  Musculoskeletal:     Right lower leg: No edema (Varicose veins noted).     Left lower leg: No edema (Varicose veins noted).     Laboratory examination:   Lab Results  Component Value Date   NA 143 12/07/2021   K 4.3 12/07/2021   CO2 22 12/07/2021   GLUCOSE 85 12/07/2021   BUN 20 12/07/2021   CREATININE 1.04 12/07/2021   CALCIUM 9.2 12/07/2021   EGFR 78 12/07/2021   GFRNONAA >60 10/30/2021        Latest Ref Rng & Units 12/07/2021   12:25 PM 10/30/2021    3:02 AM 10/21/2021    9:11 AM  CMP  Glucose 70 - 99 mg/dL 85  153  88   BUN 8 - 27 mg/dL '20  19  22   '$ Creatinine 0.76 - 1.27 mg/dL 1.04  1.14  1.11   Sodium 134 - 144 mmol/L 143  138  143   Potassium 3.5 - 5.2 mmol/L 4.3  4.6  3.9   Chloride 96 - 106 mmol/L 104  109  107   CO2 20 - 29 mmol/L '22  23  27   '$ Calcium 8.6 - 10.2 mg/dL 9.2  8.8  9.6       Latest Ref Rng & Units 12/07/2021   12:25 PM 10/21/2021    9:11 AM 08/09/2021    1:41 AM  CBC  WBC 3.4 - 10.8 x10E3/uL 6.9  4.1  4.9   Hemoglobin 13.0 - 17.7 g/dL 15.3  15.2  14.2   Hematocrit 37.5 - 51.0 % 45.8  45.7  42.8   Platelets 150 - 450 x10E3/uL 209  181  186  Lab Results   Component Value Date   CHOL 144 08/08/2021   HDL 33 (L) 08/08/2021   LDLCALC 87 08/08/2021   TRIG 121 08/08/2021   CHOLHDL 4.4 08/08/2021     HEMOGLOBIN A1C Lab Results  Component Value Date   HGBA1C 5.5 08/07/2021   MPG 111.15 08/07/2021   Lab Results  Component Value Date   TSH 3.390 05/31/2017    External labs:  Labs 03/30/2022:  Total cholesterol 159, triglycerides 03/23/2020, HDL 50, LDL 87.  Non-HDL cholesterol 109.  Serum glucose 62 mg, BUN 23, creatinine 1.10, EGFR 73 mL, potassium 4.5, LFTs normal.  Hb 15.2/HCT 45.7, platelets 250, normal indicis.  Medications and allergies   Allergies  Allergen Reactions   Losartan Other (See Comments)    Makes BP drop very low   Penicillins Other (See Comments)    Childhood reaction Has patient had a PCN reaction causing immediate rash, facial/tongue/throat swelling, SOB or lightheadedness with hypotension: yes Has patient had a PCN reaction causing severe rash involving mucus membranes or skin necrosis: no Has patient had a PCN reaction that required hospitalization: no Has patient had a PCN reaction occurring within the last 10 years: no If all of the above answers are "NO", then may proceed with Cephalosporin use.      Medication list after today's encounter    Current Outpatient Medications:    acetaminophen (TYLENOL) 500 MG tablet, Take 1,000 mg by mouth every 4 (four) hours as needed for moderate pain, headache or fever., Disp: , Rfl:    aspirin EC 81 MG tablet, Take 1 tablet (81 mg total) by mouth daily. Swallow whole., Disp: 150 tablet, Rfl: 2   Cholecalciferol (VITAMIN D3 ULTRA STRENGTH) 125 MCG (5000 UT) capsule, Take 5,000 Units by mouth at bedtime., Disp: , Rfl:    dofetilide (TIKOSYN) 250 MCG capsule, Take 1 capsule (250 mcg total) by mouth 2 (two) times daily., Disp: 180 capsule, Rfl: 3   ezetimibe (ZETIA) 10 MG tablet, Take 1 tablet (10 mg total) by mouth daily., Disp: 90 tablet, Rfl: 3   fluticasone  (FLONASE) 50 MCG/ACT nasal spray, Place 2 sprays into both nostrils every evening. , Disp: , Rfl:    guaiFENesin (MUCINEX) 600 MG 12 hr tablet, Take 600 mg by mouth 2 (two) times daily as needed for cough., Disp: , Rfl:    magnesium oxide (MAG-OX) 400 (240 Mg) MG tablet, Take 400 mg by mouth at bedtime., Disp: , Rfl:    Menthol, Topical Analgesic, (BENGAY EX), Apply 1 Application topically 4 (four) times daily as needed (soreness/pain.)., Disp: , Rfl:    metoprolol succinate (TOPROL-XL) 50 MG 24 hr tablet, TAKE 1 TABLET BY MOUTH EVERY DAY, Disp: 90 tablet, Rfl: 3   Povidone, PF, (IVIZIA DRY EYES) 0.5 % SOLN, Place 1 drop into both eyes in the morning and at bedtime. iVIZIA Lubricant Eye Gel for Severe and Nighttime Dry Eye Relief, Disp: , Rfl:    Probiotic Product (PROBIOTIC DAILY PO), Take 1 capsule by mouth in the morning. TruNature Advanced Digestive Probiotic, Disp: , Rfl:    rosuvastatin (CRESTOR) 10 MG tablet, Take 1 tablet (10 mg total) by mouth every other day. (Patient taking differently: Take 5 mg by mouth every other day.), Disp: 30 tablet, Rfl: 5   zinc gluconate 50 MG tablet, Take 50 mg by mouth at bedtime., Disp: , Rfl:   Radiology:   No results found.  CT Angio Chest/Abd/Pelvis 08/19/2020: 1. Large right-sided perinephric hematoma with evidence of  active extravasation from the midpole lower pole junction region laterally. The hematoma measures approximately 11.5 x 10.0 x 9.0 cm with mass effect on the right kidney. No obvious underlying mass is identified. Recommend interventional radiology consultation. 2. No thoracic or abdominal aortic aneurysm or dissection. 3. Irregular and beaded appearance of the right renal arteries could not exclude FMD. There are 2 right and 2 left renal arteries. 4. No acute pulmonary findings or worrisome pulmonary lesions. 5. Aortic atherosclerosis.  ADDENDUM REPORT: 10/17/2020 15:15   ADDENDUM: I reviewed this study for Dr. Adrian Prows. Concern for  aortic root dilatation. There is moderate artifact through the region of the aortic root. The maximum diameter at the sinuses of Valsalva is 41.5 mm and the diameter at the sinotubular junction is 2.9 cm.   Cardiac Studies:   Coronary CTA 11/10/2017: 1. Calcium score 5 isolated area in mid LAD this is 26 th percentile for age and sex.  2.  Normal coronary arteries.  3.  Mild aortic root dilatation 4.0 cm.  PCV ECHOCARDIOGRAM COMPLETE 0000000 Normal LV systolic function with visual EF 55-60%. Left ventricle cavity is normal in size. Normal left ventricular wall thickness. Normal global wall motion. Normal diastolic filling pattern, normal LAP. Left atrial cavity is normal in size. A lipomatous septum is present. Mild (Grade I) mitral regurgitation. Mild tricuspid regurgitation. Mild pulmonic regurgitation. The aortic root is mildly dilated (3.8cm). Proximal ascending aorta not well visualized. Compared to study 10/25/2018: no significant change.  TEE 10/29/2021:  1. TEE guided LAA closure. 24 mm Watchman FLX deployed with 13% compression. No device leak. No pericardial effusion before or after the  procedure. Small iatrogenic ASD with L to R shunting remained after the procedure.   2. Left ventricular ejection fraction, by estimation, is 55 to 60%. The left ventricle has normal function. The left ventricle has no regional  wall motion abnormalities. The left ventricular internal cavity size was mildly dilated.  Mitral Valve: Prolapse of the P2 segment of the PMVL with moderate mitral regurgitation.     EKG:   EKG 05/26/2022: Normal sinus rhythm at rate of 62 bpm, LAE, normal axis, no evidence of ischemia, otherwise normal EKG.  Compared to 11/19/2021, no significant change.  Assessment     ICD-10-CM   1. Paroxysmal atrial fibrillation (HCC)  I48.0 EKG 12-Lead    2. Presence of Watchman Watchman FLX 9m device 10/29/2021  Z95.818     3. Aortic root dilatation (HCC)  I77.810 PCV  ECHOCARDIOGRAM COMPLETE    4. Moderate mitral regurgitation  I34.0 PCV ECHOCARDIOGRAM COMPLETE    5. Encounter for long-term (current) use of high-risk medication  Z79.899      There are no discontinued medications.  No orders of the defined types were placed in this encounter.  CHA2DS2-VASc Score is 4.  Yearly risk of stroke: 5% (A, HTN, CVA).  Score of 1=0.6; 2=2.2; 3=3.2; 4=4.8; 5=7.2; 6=9.8; 7=>9.8) -(CHF; HTN; vasc disease DM,  Male = 1; Age <65 =0; 65-74 = 1,  >75 =2; stroke/embolism= 2).    Recommendations:   RRoald [name] a 72y.o. 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  Patient had LAA occlusion by implantation of Watchman FLX 24 mm device.  This is a 659-monthffice visit.  States that his neurologic deficits of dysarthria and word finding has essentially resolved, however he still feels "brain fog".  His symptoms has improved since reducing the dose of the Crestor from 10 mg to 5 mg and he is tolerating Zetia.  He has not had any palpitations.  1. Paroxysmal atrial fibrillation (HCC) Patient is maintaining sinus rhythm, QT interval is normal, continue dofetilide at low-dose.  Continue aspirin indefinitely.  2. Presence of Watchman FLX 6m device 10/29/2021 Patient is not a candidate for anticoagulation hence underwent successful closure of the left atrial appendage.  He has not had any recurrence of strokelike symptoms.  3. Aortic root dilatation (HCC) He needs surveillance echocardiogram in 6 months and I would like to also follow-up on his mitral regurgitation at the same time.  4. Moderate mitral regurgitation Clearly has moderate mitral regurgitation, no clinical evidence of heart  failure.  Will follow-up on echocardiogram in 6 months.  I would like to see him back then.  5. Encounter for long-term (current) use of high-risk medication Patient is on dofetilide.  External labs reviewed.  Potassium levels are normal.  Renal function is normal.  As patient stated that his "brain fogginess" improved with reducing the dose of the statin, I told him to hold statin for 3 to 4 weeks and if there is no improvement to go back on the statin however if he does indeed notice significant improvement, we could certainly change his statin to different agent from Crestor.  I suspect his symptoms are related to recovering prior stroke.  I reviewed his lipids, in the absence of vascular disease, LDL goal <100 would be appropriate.  His stroke was related to embolic stroke.  This is a 40-minute office visit encounter.   JAdrian Prows MD, [facility]3/13/2024, 3:23 PM Office: [phone]: [phone]: [phone]

## 2022-06-30 DIAGNOSIS — L818 Other specified disorders of pigmentation: Secondary | ICD-10-CM | POA: Diagnosis not present

## 2022-06-30 DIAGNOSIS — L570 Actinic keratosis: Secondary | ICD-10-CM | POA: Diagnosis not present

## 2022-06-30 DIAGNOSIS — L578 Other skin changes due to chronic exposure to nonionizing radiation: Secondary | ICD-10-CM | POA: Diagnosis not present

## 2022-06-30 DIAGNOSIS — B360 Pityriasis versicolor: Secondary | ICD-10-CM | POA: Diagnosis not present

## 2022-06-30 DIAGNOSIS — D1801 Hemangioma of skin and subcutaneous tissue: Secondary | ICD-10-CM | POA: Diagnosis not present

## 2022-06-30 DIAGNOSIS — L814 Other melanin hyperpigmentation: Secondary | ICD-10-CM | POA: Diagnosis not present

## 2022-06-30 DIAGNOSIS — L821 Other seborrheic keratosis: Secondary | ICD-10-CM | POA: Diagnosis not present

## 2022-06-30 DIAGNOSIS — D229 Melanocytic nevi, unspecified: Secondary | ICD-10-CM | POA: Diagnosis not present

## 2022-06-30 DIAGNOSIS — Z85828 Personal history of other malignant neoplasm of skin: Secondary | ICD-10-CM | POA: Diagnosis not present

## 2022-07-22 ENCOUNTER — Other Ambulatory Visit: Payer: Self-pay | Admitting: Cardiology

## 2022-07-22 DIAGNOSIS — I480 Paroxysmal atrial fibrillation: Secondary | ICD-10-CM

## 2022-07-22 NOTE — Telephone Encounter (Signed)
Can I refill this? 

## 2022-08-10 DIAGNOSIS — I4891 Unspecified atrial fibrillation: Secondary | ICD-10-CM | POA: Diagnosis not present

## 2022-08-10 DIAGNOSIS — I10 Essential (primary) hypertension: Secondary | ICD-10-CM | POA: Diagnosis not present

## 2022-08-10 DIAGNOSIS — E7800 Pure hypercholesterolemia, unspecified: Secondary | ICD-10-CM | POA: Diagnosis not present

## 2022-08-10 DIAGNOSIS — I509 Heart failure, unspecified: Secondary | ICD-10-CM | POA: Diagnosis not present

## 2022-08-12 ENCOUNTER — Encounter: Payer: Self-pay | Admitting: Cardiology

## 2022-08-17 DIAGNOSIS — Z5189 Encounter for other specified aftercare: Secondary | ICD-10-CM | POA: Diagnosis not present

## 2022-08-17 DIAGNOSIS — L578 Other skin changes due to chronic exposure to nonionizing radiation: Secondary | ICD-10-CM | POA: Diagnosis not present

## 2022-08-17 DIAGNOSIS — Z85828 Personal history of other malignant neoplasm of skin: Secondary | ICD-10-CM | POA: Diagnosis not present

## 2022-09-05 ENCOUNTER — Other Ambulatory Visit: Payer: Self-pay | Admitting: Cardiology

## 2022-09-05 DIAGNOSIS — I480 Paroxysmal atrial fibrillation: Secondary | ICD-10-CM

## 2022-09-07 DIAGNOSIS — K5909 Other constipation: Secondary | ICD-10-CM | POA: Diagnosis not present

## 2022-09-07 DIAGNOSIS — R252 Cramp and spasm: Secondary | ICD-10-CM | POA: Diagnosis not present

## 2022-09-07 DIAGNOSIS — Z0000 Encounter for general adult medical examination without abnormal findings: Secondary | ICD-10-CM | POA: Diagnosis not present

## 2022-09-07 DIAGNOSIS — Z6823 Body mass index (BMI) 23.0-23.9, adult: Secondary | ICD-10-CM | POA: Diagnosis not present

## 2022-09-14 DIAGNOSIS — Z6824 Body mass index (BMI) 24.0-24.9, adult: Secondary | ICD-10-CM | POA: Diagnosis not present

## 2022-09-14 DIAGNOSIS — R058 Other specified cough: Secondary | ICD-10-CM | POA: Diagnosis not present

## 2022-09-30 DIAGNOSIS — Z6823 Body mass index (BMI) 23.0-23.9, adult: Secondary | ICD-10-CM | POA: Diagnosis not present

## 2022-09-30 DIAGNOSIS — J40 Bronchitis, not specified as acute or chronic: Secondary | ICD-10-CM | POA: Diagnosis not present

## 2022-09-30 DIAGNOSIS — S8011XA Contusion of right lower leg, initial encounter: Secondary | ICD-10-CM | POA: Diagnosis not present

## 2022-10-27 DIAGNOSIS — L570 Actinic keratosis: Secondary | ICD-10-CM | POA: Diagnosis not present

## 2022-10-27 DIAGNOSIS — B078 Other viral warts: Secondary | ICD-10-CM | POA: Diagnosis not present

## 2022-10-27 DIAGNOSIS — L820 Inflamed seborrheic keratosis: Secondary | ICD-10-CM | POA: Diagnosis not present

## 2022-10-27 NOTE — Telephone Encounter (Signed)
done

## 2022-11-16 DIAGNOSIS — I5022 Chronic systolic (congestive) heart failure: Secondary | ICD-10-CM | POA: Diagnosis not present

## 2022-11-16 DIAGNOSIS — I480 Paroxysmal atrial fibrillation: Secondary | ICD-10-CM | POA: Diagnosis not present

## 2022-11-22 ENCOUNTER — Ambulatory Visit: Payer: PPO

## 2022-11-22 DIAGNOSIS — I340 Nonrheumatic mitral (valve) insufficiency: Secondary | ICD-10-CM | POA: Diagnosis not present

## 2022-11-22 DIAGNOSIS — I77810 Thoracic aortic ectasia: Secondary | ICD-10-CM

## 2022-11-29 ENCOUNTER — Ambulatory Visit: Payer: PPO | Admitting: Cardiology

## 2022-11-29 ENCOUNTER — Encounter: Payer: Self-pay | Admitting: Cardiology

## 2022-11-29 VITALS — BP 123/80 | HR 60 | Resp 16 | Ht 72.0 in | Wt 177.2 lb

## 2022-11-29 DIAGNOSIS — I480 Paroxysmal atrial fibrillation: Secondary | ICD-10-CM

## 2022-11-29 DIAGNOSIS — I340 Nonrheumatic mitral (valve) insufficiency: Secondary | ICD-10-CM | POA: Diagnosis not present

## 2022-11-29 DIAGNOSIS — I77810 Thoracic aortic ectasia: Secondary | ICD-10-CM

## 2022-11-29 DIAGNOSIS — Z95818 Presence of other cardiac implants and grafts: Secondary | ICD-10-CM

## 2022-11-29 NOTE — Progress Notes (Signed)
Primary Physician/Referring:  Daisy Floro, MD  Patient ID: Terry [name], male    DOB: Jun 16, 1950, 72 y.o.   MRN: [id number]  Chief Complaint  Patient presents with  . Paroxysmal atrial fibrillation   . Mitral Regurgitation  . Aortic root dilatation  . Follow-up    6 months    HPI:    Terry [name]  is a 72 y.o. Caucasian male with paroxysmal atrial fibrillation on dofetilide, hypertension, aortic dilation at 4.0 cm, moderate midtal regurgitation due to MVP and right retinal vein occlusion at age 24, retroperitoneal bleed due to Eliquis on 08/2020.  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  Patient had LAA occlusion by implantation of Watchman FLX 24 mm device.  This is a 25-month office visit.  He remains asymptomatic  Past Medical History:  Diagnosis Date  . Aortic root dilation (HCC)    a. mild by CT 10/2017.  [name] Basal cell carcinoma 10/02/2019   left hand bcc nod.  . Cardiomyopathy (HCC)   . Central retinal vein occlusion   . Chronic systolic CHF (congestive heart failure) (HCC)   . ED (erectile dysfunction)   . H/O cardiac catheterization 2002 - normal cors  . HTN (hypertension)   . Hyperlipidemia   . Hypokalemia   . Leg edema   . LV dysfunction    a. EF appeared mildly depressed by TEE 08/2015, no % given.  . Mini stroke    a. ? Age 89 at time of central retinal vein occlusion - lost vision in eye  . PAF (paroxysmal atrial fibrillation) (HCC)    a. Dx 06/2015, s/p TEE/DCCV 08/2015.  [name] Pericarditis 2010  . 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 Lalla Brothers  . Syncope    Hx of thought to be due to orthostatic hypotension  . Visit for monitoring Tikosyn therapy 01/17/2018   Past Surgical History:  Procedure Laterality Date  . CARDIAC CATHETERIZATION  2002  . CARDIOVERSION N/A 09/08/2015   Procedure: CARDIOVERSION;  Surgeon: Pricilla Riffle, MD;  Location: [facility]  ENDOSCOPY;  Service: Cardiovascular;  Laterality: N/A;  . CARDIOVERSION N/A 06/28/2017   Procedure: CARDIOVERSION;  Surgeon: Jake Bathe, MD;  Location: [facility] ENDOSCOPY;  Service: Cardiovascular;  Laterality: N/A;  . HAND SURGERY  2003  . KNEE SURGERY    . LEFT ATRIAL APPENDAGE OCCLUSION N/A 10/29/2021   Procedure: LEFT ATRIAL APPENDAGE OCCLUSION;  Surgeon: Lanier Prude, MD;  Location: [facility] INVASIVE CV LAB;  Service: Cardiovascular;  Laterality: N/A;  . NASAL SINUS SURGERY  2003   x2. Scar tissue build up and had to recreate his tear duct  . PERICARDIECTOMY    . TEE WITHOUT CARDIOVERSION N/A 09/08/2015   Procedure: TRANSESOPHAGEAL ECHOCARDIOGRAM (TEE);  Surgeon: Pricilla Riffle, MD;  Location: [facility] ENDOSCOPY;  Service: Cardiovascular;  Laterality: N/A;  . TEE WITHOUT CARDIOVERSION N/A 06/28/2017   Procedure: TRANSESOPHAGEAL ECHOCARDIOGRAM (TEE);  Surgeon: Jake Bathe, MD;  Location: [facility] ENDOSCOPY;  Service: Cardiovascular;  Laterality: N/A;  . TEE WITHOUT CARDIOVERSION N/A 10/29/2021   Procedure: TRANSESOPHAGEAL ECHOCARDIOGRAM (TEE);  Surgeon: Lanier Prude, MD;  Location: [facility] INVASIVE CV LAB;  Service: Cardiovascular;  Laterality: N/A;   Family History  Problem Relation Age of Onset  . Hypertension Mother   . Diabetes Mother   . Cancer Mother   . Atrial fibrillation Father   . Heart disease Father        had  pacemaker  . Hypertension Father   . Diabetes Father   . Atrial fibrillation Brother   . Heart attack Brother 62    Social History   Tobacco Use  . Smoking status: Never  . Smokeless tobacco: Never  Substance Use Topics  . Alcohol use: Not Currently    Comment: 2-3 Times a Week Wine with Meals   Marital Status: Married  ROS  Review of Systems  Cardiovascular:  Negative for chest pain, dyspnea on exertion and leg swelling.   Objective  Blood pressure 123/87, pulse 60, resp. rate 16, height 6' (1.829 m), weight 177 lb 3.2 oz (80.4 kg), SpO2 92%. Body mass index is  24.03 kg/m.     11/29/2022   10:12 AM 05/26/2022    2:28 PM 04/15/2022    2:04 PM  Vitals with BMI  Height 6\' 0"  6\' 0"  6\' 0"   Weight 177 lbs 3 oz 175 lbs 164 lbs  BMI 24.03 23.73 22.24  Systolic 123 99 118  Diastolic 87 61 74  Pulse 60 64 72     Physical Exam Vitals reviewed.  Neck:     Vascular: No carotid bruit or JVD.  Cardiovascular:     Rate and Rhythm: Normal rate and regular rhythm.     Pulses: Normal pulses and intact distal pulses.     Heart sounds: A midsystolic click. Murmur heard.     High-pitched blowing holosystolic murmur is present with a grade of 3/6 at the apex radiating to the axilla.     No gallop.  Pulmonary:     Effort: Pulmonary effort is normal.     Breath sounds: Normal breath sounds.  Abdominal:     General: Bowel sounds are normal.     Palpations: Abdomen is soft.  Musculoskeletal:     Right lower leg: No edema (Varicose veins noted).     Left lower leg: No edema (Varicose veins noted).    Laboratory examination:   Lab Results  Component Value Date   NA 143 12/07/2021   K 4.3 12/07/2021   CO2 22 12/07/2021   GLUCOSE 85 12/07/2021   BUN 20 12/07/2021   CREATININE 1.04 12/07/2021   CALCIUM 9.2 12/07/2021   EGFR 78 12/07/2021   GFRNONAA >60 10/30/2021        Latest Ref Rng & Units 12/07/2021   12:25 PM 10/30/2021    3:02 AM 10/21/2021    9:11 AM  CMP  Glucose 70 - 99 mg/dL 85  161  88   BUN 8 - 27 mg/dL 20  19  22    Creatinine 0.76 - 1.27 mg/dL 0.96  0.45  4.09   Sodium 134 - 144 mmol/L 143  138  143   Potassium 3.5 - 5.2 mmol/L 4.3  4.6  3.9   Chloride 96 - 106 mmol/L 104  109  107   CO2 20 - 29 mmol/L 22  23  27    Calcium 8.6 - 10.2 mg/dL 9.2  8.8  9.6       Latest Ref Rng & Units 12/07/2021   12:25 PM 10/21/2021    9:11 AM 08/09/2021    1:41 AM  CBC  WBC 3.4 - 10.8 x10E3/uL 6.9  4.1  4.9   Hemoglobin 13.0 - 17.7 g/dL 81.1  91.4  78.2   Hematocrit 37.5 - 51.0 % 45.8  45.7  42.8   Platelets 150 - 450 x10E3/uL 209  181  186     External labs:  Labs  03/30/2022:  Total cholesterol 159, triglycerides 122, HDL 50, LDL 87.  Serum glucose 62 mg, BUN 23, creatinine 1.10, EGFR 73 mL, potassium 4.5, LFTs normal.  Hb 15.2/HCT 45.7, platelets 250, normal indicis.  Medications and allergies   Allergies  Allergen Reactions  . Losartan Other (See Comments)    Makes BP drop very low  . Penicillins Other (See Comments)    Childhood reaction Has patient had a PCN reaction causing immediate rash, facial/tongue/throat swelling, SOB or lightheadedness with hypotension: yes Has patient had a PCN reaction causing severe rash involving mucus membranes or skin necrosis: no Has patient had a PCN reaction that required hospitalization: no Has patient had a PCN reaction occurring within the last 10 years: no If all of the above answers are "NO", then may proceed with Cephalosporin use.      Medication list after today's encounter    Current Outpatient Medications:  .  acetaminophen (TYLENOL) 500 MG tablet, Take 1,000 mg by mouth every 4 (four) hours as needed for moderate pain, headache or fever., Disp: , Rfl:  .  aspirin EC 81 MG tablet, Take 1 tablet (81 mg total) by mouth daily. Swallow whole., Disp: 150 tablet, Rfl: 2 .  Cholecalciferol (VITAMIN D3 ULTRA STRENGTH) 125 MCG (5000 UT) capsule, Take 5,000 Units by mouth at bedtime., Disp: , Rfl:  .  dofetilide (TIKOSYN) 250 MCG capsule, TAKE 1 CAPSULE BY MOUTH TWICE A DAY, Disp: 180 capsule, Rfl: 3 .  ezetimibe (ZETIA) 10 MG tablet, Take 1 tablet (10 mg total) by mouth daily., Disp: 90 tablet, Rfl: 3 .  fluticasone (FLONASE) 50 MCG/ACT nasal spray, Place 2 sprays into both nostrils every evening. , Disp: , Rfl:  .  guaiFENesin (MUCINEX) 600 MG 12 hr tablet, Take 600 mg by mouth 2 (two) times daily as needed for cough., Disp: , Rfl:  .  magnesium oxide (MAG-OX) 400 (240 Mg) MG tablet, TAKE 1 TABLET BY MOUTH EVERY DAY, Disp: 120 tablet, Rfl: 2 .  Menthol, Topical Analgesic,  (BENGAY EX), Apply 1 Application topically 4 (four) times daily as needed (soreness/pain.)., Disp: , Rfl:  .  metoprolol succinate (TOPROL-XL) 50 MG 24 hr tablet, TAKE 1 TABLET BY MOUTH EVERY DAY, Disp: 90 tablet, Rfl: 3 .  Povidone, PF, (IVIZIA DRY EYES) 0.5 % SOLN, Place 1 drop into both eyes in the morning and at bedtime. iVIZIA Lubricant Eye Gel for Severe and Nighttime Dry Eye Relief, Disp: , Rfl:  .  Probiotic Product (PROBIOTIC DAILY PO), Take 1 capsule by mouth in the morning. TruNature Advanced Digestive Probiotic, Disp: , Rfl:  .  rosuvastatin (CRESTOR) 10 MG tablet, Take 1 tablet (10 mg total) by mouth every other day. (Patient taking differently: Take 5 mg by mouth every other day.), Disp: 30 tablet, Rfl: 5 .  zinc gluconate 50 MG tablet, Take 50 mg by mouth at bedtime., Disp: , Rfl:   Radiology:   No results found.  CT Angio Chest/Abd/Pelvis 08/19/2020: 1. Large right-sided perinephric hematoma with evidence of active extravasation from the midpole lower pole junction region laterally. The hematoma measures approximately 11.5 x 10.0 x 9.0 cm with mass effect on the right kidney. No obvious underlying mass is identified. Recommend interventional radiology consultation. 2. No thoracic or abdominal aortic aneurysm or dissection. 3. Irregular and beaded appearance of the right renal arteries could not exclude FMD. There are 2 right and 2 left renal arteries. 4. No acute pulmonary findings or worrisome pulmonary lesions. 5. Aortic atherosclerosis.  ADDENDUM REPORT: 10/17/2020 15:15   ADDENDUM: I reviewed this study for Dr. Yates Decamp. Concern for aortic root dilatation. There is moderate artifact through the region of the aortic root. The maximum diameter at the sinuses of Valsalva is 41.5 mm and the diameter at the sinotubular junction is 2.9 cm.   Cardiac Studies:   Coronary CTA 11/10/2017: 1. Calcium score 5 isolated area in mid LAD this is 26 th percentile for age and sex.   2.  Normal coronary arteries.  3.  Mild aortic root dilatation 4.0 cm.  TEE 10/29/2021:  1. TEE guided LAA closure. 24 mm Watchman FLX deployed with 13% compression. No device leak. No pericardial effusion before or after the  procedure. Small iatrogenic ASD with L to R shunting remained after the procedure.   2. Left ventricular ejection fraction, by estimation, is 55 to 60%. The left ventricle has normal function. The left ventricle has no regional  wall motion abnormalities. The left ventricular internal cavity size was mildly dilated.  Mitral Valve: Prolapse of the P2 segment of the PMVL with moderate mitral regurgitation.   Echocardiogram 11/22/2022: Left ventricle cavity is normal in size. Normal left ventricular wall thickness. Normal global wall motion. Normal LV systolic function with EF 63%. Normal diastolic filling pattern.  The aortic root is normal. Borderline dilated ascending aorta at 3.8 cm. Left atrial cavity is severely dilated at 47 ml/m^2. Mild thickening with posterior leaflet prolapse. Moderate to severe, anteriorly directed, eccentric  mitral regurgitation. Mild tricuspid regurgitation. Estimated pulmonary artery systolic pressure 46 mmHg.  Compared to previous TEE on 10/29/2021, PH is new.     EKG:   EKG 11/29/2022: Normal sinus rhythm at the rate of 54 bpm, left atrial enlargement, otherwise normal EKG.  Compared to 05/26/2022, no change.  Assessment     ICD-10-CM   1. Paroxysmal atrial fibrillation (HCC)  I48.0 EKG 12-Lead    2. Moderate mitral regurgitation  I34.0     3. Aortic root dilatation (HCC)  I77.810     4. Presence of Watchman Watchman FLX 24mm device 10/29/2021  Z95.818       There are no discontinued medications.  No orders of the defined types were placed in this encounter.  CHA2DS2-VASc Score is 4.  Yearly risk of stroke: 5% (A, HTN, CVA).  Score of 1=0.6; 2=2.2; 3=3.2; 4=4.8; 5=7.2; 6=9.8; 7=>9.8) -(CHF; HTN; vasc disease DM,  Male = 1;  Age <65 =0; 65-74 = 1,  >75 =2; stroke/embolism= 2).    Recommendations:   Otilio Loria is a 72 y.o. Caucasian male with paroxysmal atrial fibrillation on dofetilide, hypertension, aortic dilation at 4.0 cm, moderate midtal regurgitation due to MVP and right retinal vein occlusion at age 79, retroperitoneal bleed due to Eliquis on 08/2020.  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  This is a 63-month office visit.  1. Moderate to severe mitral regurgitation He has now developed mild pulm hypertension that is new from previous, I will repeat echocardiogram in 6 months.  He is completely asymptomatic, left ventricular size is normal, no indication for surgery at.  He continues to exercise on a regular basis as well.  - ECHOCARDIOGRAM COMPLETE; Future  2. Paroxysmal atrial fibrillation (HCC) He is maintaining sinus rhythm.  He is SP left atrial appendage occluder placement, has not had any TIA-like symptoms. - EKG 12-Lead  3. Aortic root dilatation (HCC) I [place]  dilatation has remained stable.  He is presently on metoprolol. - ECHOCARDIOGRAM COMPLETE; Future  4. 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 - ECHOCARDIOGRAM COMPLETE; Future  I would like to see him back in 6 months in view of new pulmonary hypertension to especially follow-up on mitral regurgitation.  I have extensively discussed with the patient regarding indications for mitral valve repair.   Yates Decamp, MD, [facility] 11/29/2022, 10:15 AM Office: [phone] Fax: [phone] Pager: [phone]

## 2022-12-23 DIAGNOSIS — Z6841 Body Mass Index (BMI) 40.0 and over, adult: Secondary | ICD-10-CM | POA: Diagnosis not present

## 2022-12-23 DIAGNOSIS — D6869 Other thrombophilia: Secondary | ICD-10-CM | POA: Diagnosis not present

## 2022-12-23 DIAGNOSIS — I4891 Unspecified atrial fibrillation: Secondary | ICD-10-CM | POA: Diagnosis not present

## 2023-01-17 ENCOUNTER — Other Ambulatory Visit: Payer: Self-pay | Admitting: Cardiology

## 2023-01-17 DIAGNOSIS — I10 Essential (primary) hypertension: Secondary | ICD-10-CM

## 2023-02-10 IMAGING — CT CT ANGIO HEAD-NECK (W OR W/O PERF)
2 of 7 series · 8 of 33 positions shown · non-contrast
Comparison: No prior CTA, correlation is made with CT head
08/07/2021

CLINICAL DATA: Slurred speech, stroke suspected

EXAM:
CT ANGIOGRAPHY HEAD AND NECK
TECHNIQUE: Multidetector CT imaging of the head and neck was performed using
the standard protocol during bolus administration of intravenous
contrast. Multiplanar CT image reconstructions and MIPs were
obtained to evaluate the vascular anatomy. Carotid stenosis
measurements (when applicable) are obtained utilizing NASCET
criteria, using the distal internal carotid diameter as the
denominator.

[Series 5: cta neck · axial · 0.56mm/px · z∈[-203,-69]mm · 2 of 195 slices shown]
[im 65/195  soft-tissue]
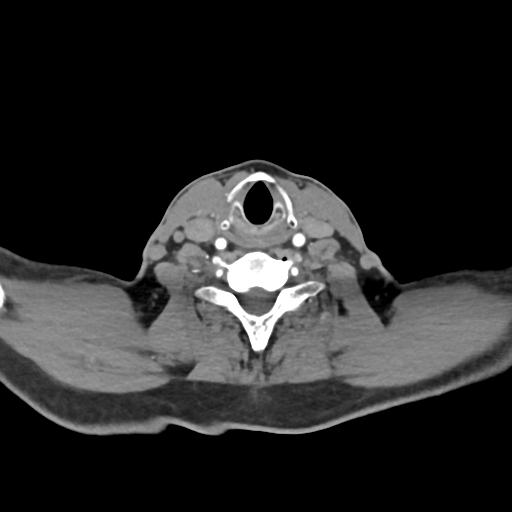
[im 130/195  soft-tissue]
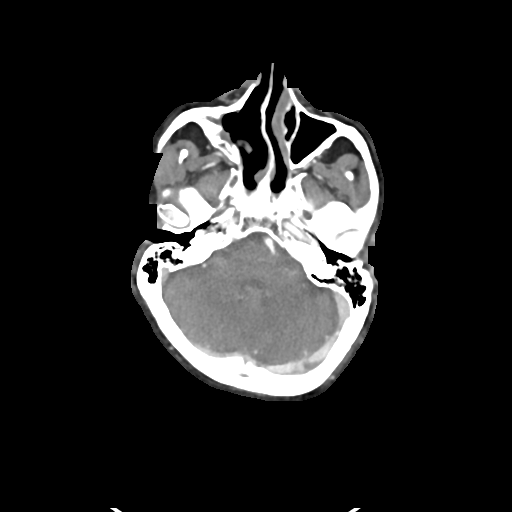

[Series 7: cta neck axial · axial · 0.53mm/px · z∈[-283,-3]mm · 6 of 388 slices shown]
[im 56/388  soft-tissue]
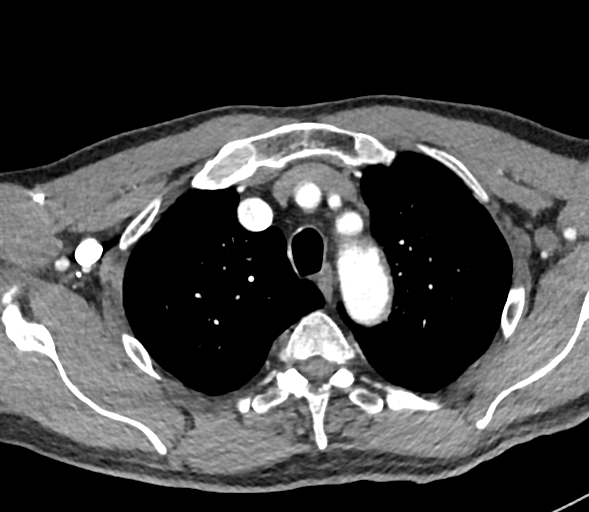
[im 111/388  bone]
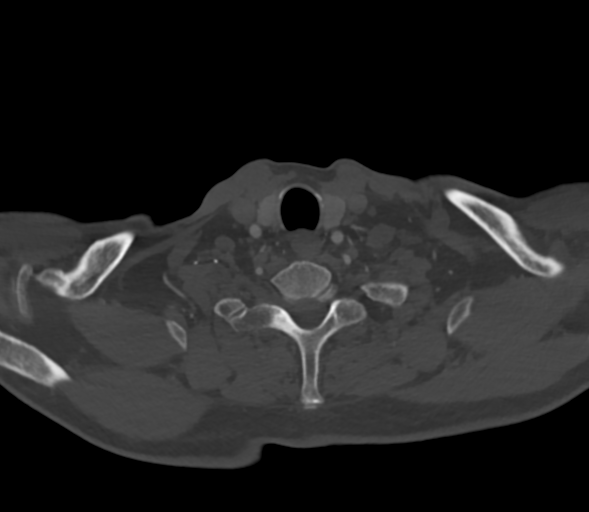
[im 166/388  soft-tissue]
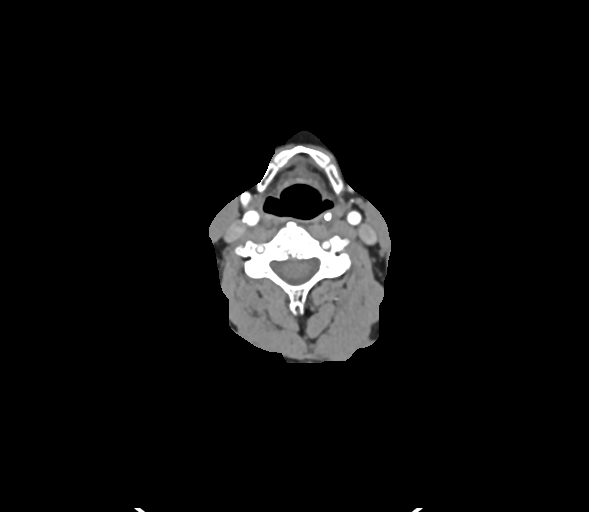
[im 222/388  bone]
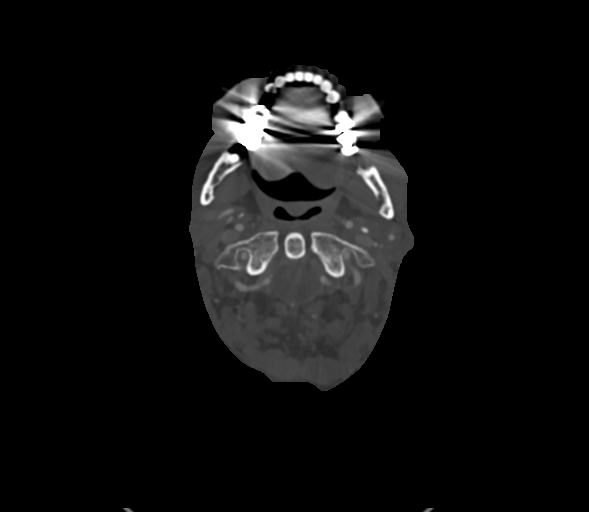
[im 277/388  soft-tissue]
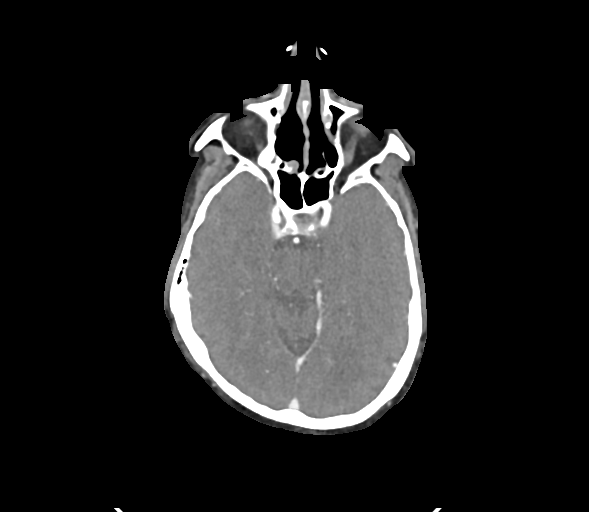
[im 332/388  bone]
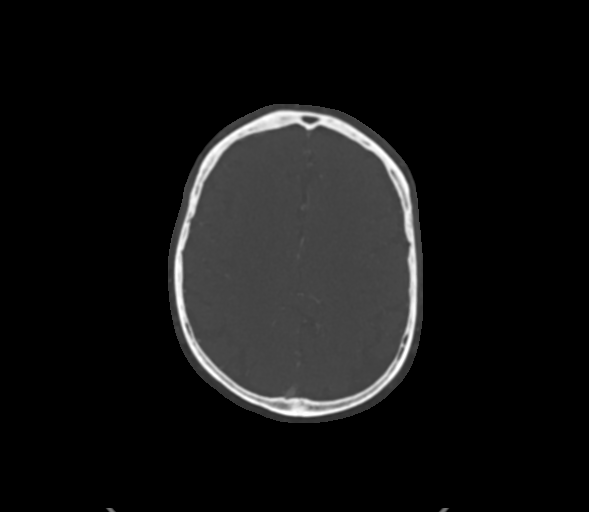

[8 of 33 positions shown; findings below may reference images not displayed]

RADIATION DOSE REDUCTION: This exam was performed according to the
departmental dose-optimization program which includes automated
exposure control, adjustment of the mA and/or kV according to
patient size and/or use of iterative reconstruction technique.

CONTRAST:  75mL OMNIPAQUE IOHEXOL 350 MG/ML SOLN
FINDINGS: CT HEAD FINDINGS

For noncontrast findings, please see same day CT head.

CTA NECK FINDINGS

Aortic arch: Standard branching. Imaged portion shows no evidence of
aneurysm or dissection. No significant stenosis of the major arch
vessel origins.

Right carotid system: No evidence of dissection, occlusion, or
hemodynamically significant stenosis (greater than 50%).

Left carotid system: No evidence of dissection, occlusion, or
hemodynamically significant stenosis (greater than 50%).

Vertebral arteries: No evidence of dissection, occlusion, or
hemodynamically significant stenosis (greater than 50%).

Skeleton: No acute osseous abnormality.

Other neck: No acute finding.

Upper chest: No focal pulmonary opacity or pleural effusion.

Review of the MIP images confirms the above findings

CTA HEAD FINDINGS

Anterior circulation: Both internal carotid arteries are patent to
the termini, without significant stenosis.

A1 segments patent. Normal anterior communicating artery. Anterior
cerebral arteries are patent to their distal aspects.

No M1 stenosis or occlusion. Normal MCA bifurcations. Distal MCA
branches perfused and symmetric.

Posterior circulation: Vertebral arteries patent to the
vertebrobasilar junction without stenosis. Posterior inferior
cerebellar arteries patent proximally.

Basilar patent to its distal aspect. Superior cerebellar arteries
patent proximally.

Patent P1 segments. Mild irregularity in the left P2 without
significant stenosis. PCAs perfused to their distal aspects without
stenosis. The bilateral posterior communicating arteries are not
visualized.

Venous sinuses: As permitted by contrast timing, patent.

Anatomic variants: None significant.

Review of the MIP images confirms the above findings
IMPRESSION: 1.  No intracranial large vessel occlusion or significant stenosis.
2.  No hemodynamically significant stenosis in the neck.

## 2023-02-11 DIAGNOSIS — H524 Presbyopia: Secondary | ICD-10-CM | POA: Diagnosis not present

## 2023-02-22 DIAGNOSIS — H35033 Hypertensive retinopathy, bilateral: Secondary | ICD-10-CM | POA: Diagnosis not present

## 2023-02-22 DIAGNOSIS — H35373 Puckering of macula, bilateral: Secondary | ICD-10-CM | POA: Diagnosis not present

## 2023-02-22 DIAGNOSIS — H43811 Vitreous degeneration, right eye: Secondary | ICD-10-CM | POA: Diagnosis not present

## 2023-02-22 DIAGNOSIS — H43822 Vitreomacular adhesion, left eye: Secondary | ICD-10-CM | POA: Diagnosis not present

## 2023-02-22 DIAGNOSIS — H2513 Age-related nuclear cataract, bilateral: Secondary | ICD-10-CM | POA: Diagnosis not present

## 2023-02-22 DIAGNOSIS — H34832 Tributary (branch) retinal vein occlusion, left eye, with macular edema: Secondary | ICD-10-CM | POA: Diagnosis not present

## 2023-02-22 DIAGNOSIS — H34811 Central retinal vein occlusion, right eye, with macular edema: Secondary | ICD-10-CM | POA: Diagnosis not present

## 2023-02-24 IMAGING — DX DG CHEST 2V
2 series · 2 of 2 positions shown · non-contrast
Comparison: Previous studies including the examination of
04/30/2019

CLINICAL DATA: Follow-up of COVID infection

EXAM:
CHEST - 2 VIEW

[dg chest 2 view (1 of 2)]
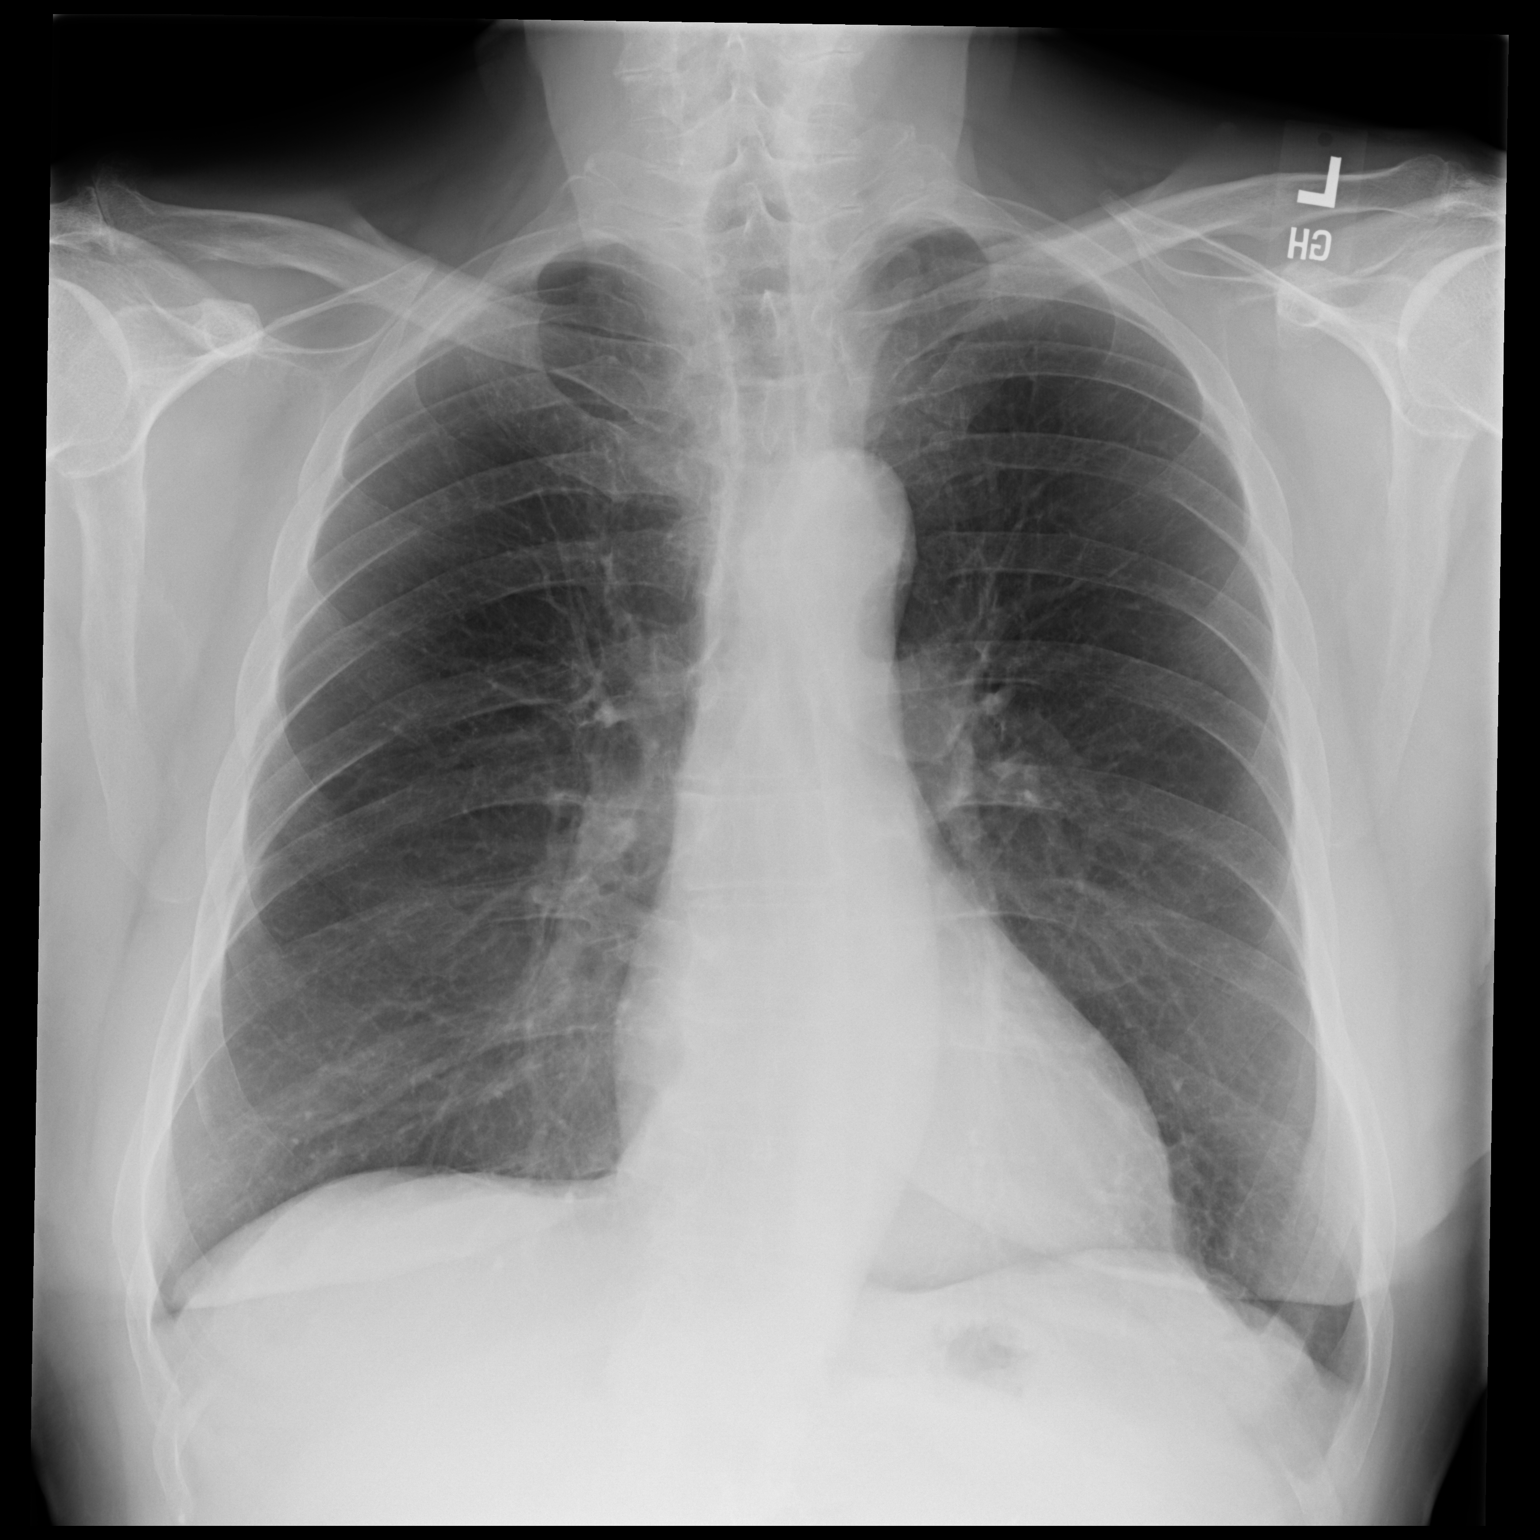

[dg chest 2 view (2 of 2)]
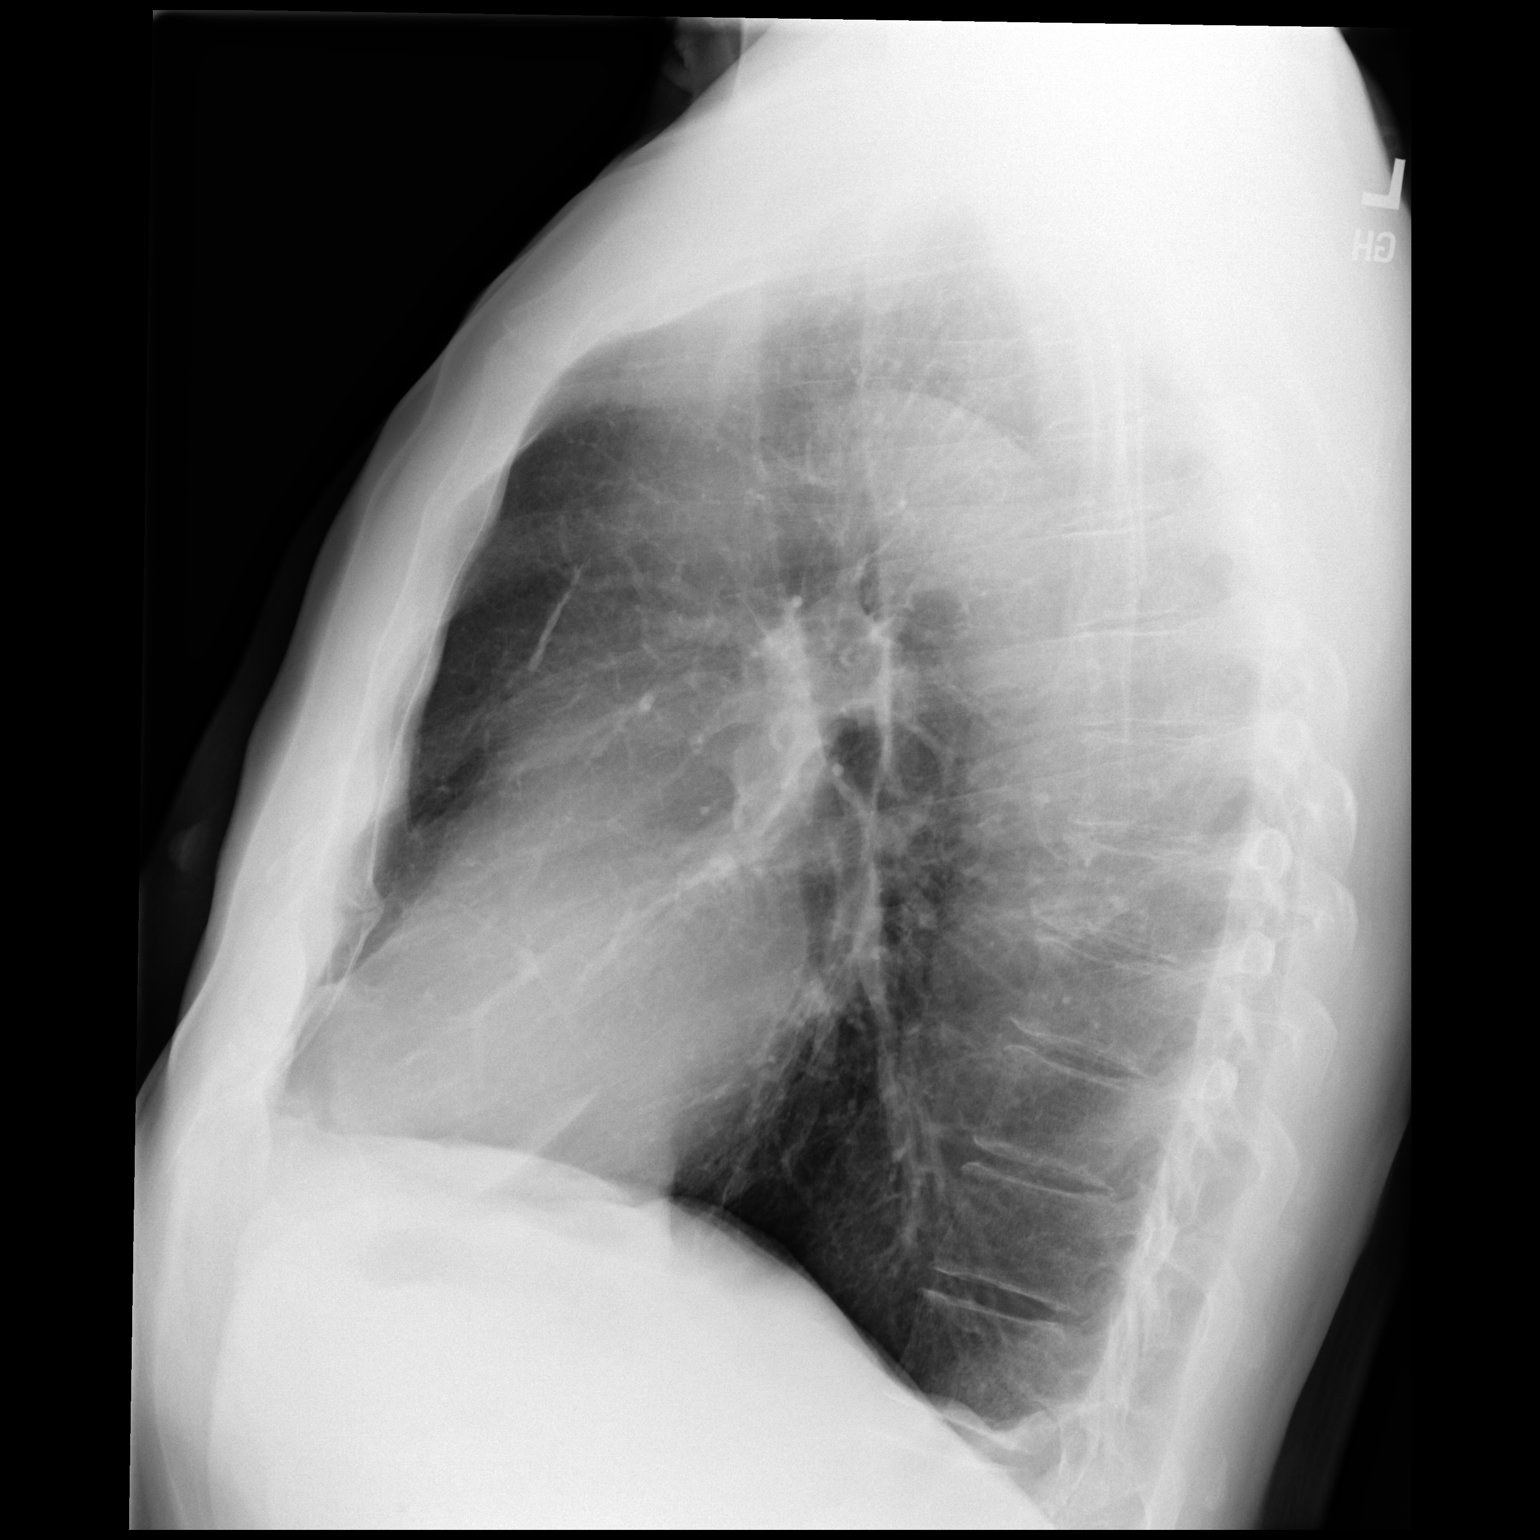

[2 of 2 positions shown; findings below may reference images not displayed]

FINDINGS: Cardiac size is within normal limits. Increase in AP diameter of
chest suggests COPD. Lung fields are clear of any infiltrates or
pulmonary edema. There is no pleural effusion or pneumothorax.
IMPRESSION: There are no focal infiltrates or signs of pulmonary edema.

## 2023-03-15 DIAGNOSIS — H35033 Hypertensive retinopathy, bilateral: Secondary | ICD-10-CM | POA: Diagnosis not present

## 2023-03-15 DIAGNOSIS — H35373 Puckering of macula, bilateral: Secondary | ICD-10-CM | POA: Diagnosis not present

## 2023-03-15 DIAGNOSIS — H2513 Age-related nuclear cataract, bilateral: Secondary | ICD-10-CM | POA: Diagnosis not present

## 2023-03-15 DIAGNOSIS — H43811 Vitreous degeneration, right eye: Secondary | ICD-10-CM | POA: Diagnosis not present

## 2023-03-15 DIAGNOSIS — H34832 Tributary (branch) retinal vein occlusion, left eye, with macular edema: Secondary | ICD-10-CM | POA: Diagnosis not present

## 2023-03-15 DIAGNOSIS — H43822 Vitreomacular adhesion, left eye: Secondary | ICD-10-CM | POA: Diagnosis not present

## 2023-03-15 DIAGNOSIS — H34811 Central retinal vein occlusion, right eye, with macular edema: Secondary | ICD-10-CM | POA: Diagnosis not present

## 2023-04-14 ENCOUNTER — Other Ambulatory Visit: Payer: Self-pay | Admitting: Cardiology

## 2023-04-14 DIAGNOSIS — E7800 Pure hypercholesterolemia, unspecified: Secondary | ICD-10-CM

## 2023-04-21 ENCOUNTER — Telehealth: Payer: Self-pay | Admitting: Pharmacy Technician

## 2023-04-21 ENCOUNTER — Other Ambulatory Visit (HOSPITAL_COMMUNITY): Payer: Self-pay

## 2023-04-21 NOTE — Telephone Encounter (Signed)
 THE SCAN IN MEDIA SAYS PRIOR AUTH BUT IT DOES NOT NEED PA PER INSURANCE, MEMBER STARTED TIER EXCEPTION. I provided information to them for the tier exception.   REQ ID [id number]

## 2023-04-21 NOTE — Telephone Encounter (Signed)
 Pharmacy Patient Advocate Encounter   Received notification from Fax that prior authorization for dofetilide  is required/requested.   Insurance verification completed.   The patient is insured through [facility] ADVANTAGE/RX ADVANCE .   Per test claim: Refill too soon. PA is not needed at this time. Medication was filled 04/15/23. Next eligible fill date is 06/22/23.

## 2023-05-02 ENCOUNTER — Telehealth: Payer: Self-pay | Admitting: Cardiology

## 2023-05-02 NOTE — Telephone Encounter (Signed)
 Left a message for the pt to call back.

## 2023-05-02 NOTE — Telephone Encounter (Signed)
Pt c/o medication issue:  1. Name of Medication:   ezetimibe (ZETIA) 10 MG tablet    2. How are you currently taking this medication (dosage and times per day)?   TAKE 1 TABLET BY MOUTH EVERY DAY    3. Are you having a reaction (difficulty breathing--STAT)? No  4. What is your medication issue? Pt would like a c/b regarding medication. He states that he had labs done by PCP and cholesterol came back high. Please advise

## 2023-05-23 NOTE — Telephone Encounter (Signed)
 I spoke with patient who reports he had lab work done recently by PCP and numbers were elevated.  Results are scanned in under media tab. Patient reports he is taking Zetia but has been off Rosuvastatin for about a year due to brain fog.  Per notes this was stopped at 05/26/22 office visit.  Patient reports brain fog improved after stopping Rosuvastatin.  Patient is asking if Dr Jacinto Halim could review lab results and if any changes needed. Patient has appointment with Dr Jacinto Halim on 4/22

## 2023-05-24 MED ORDER — ROSUVASTATIN CALCIUM 20 MG PO TABS: ORAL_TABLET | ORAL | 6 refills | Status: DC

## 2023-05-24 NOTE — Telephone Encounter (Signed)
 PCP faxed labs 04/20/2023:  Total cholesterol 234, triglycerides 156, HDL 46, LDL 159.  Non-HDL cholesterol 188.  If he is willing to take Crestor 20 mg three times a week or Lipitor 20 mg daily, suspect LDL will be < 100

## 2023-05-24 NOTE — Telephone Encounter (Signed)
 Patient notified.  He would like to try Rosuvastatin 20 mg three times a week.He will continue Zetia.  Patient will let us know if unable to tolerate this dose.  Prescription sent to [facility] on [address]. Patient has appointment with Dr Jacinto Halim on 4/22

## 2023-05-31 ENCOUNTER — Ambulatory Visit (HOSPITAL_COMMUNITY): Payer: PPO | Attending: Cardiology

## 2023-05-31 DIAGNOSIS — Z95818 Presence of other cardiac implants and grafts: Secondary | ICD-10-CM | POA: Diagnosis not present

## 2023-05-31 DIAGNOSIS — I77810 Thoracic aortic ectasia: Secondary | ICD-10-CM | POA: Diagnosis not present

## 2023-05-31 DIAGNOSIS — I340 Nonrheumatic mitral (valve) insufficiency: Secondary | ICD-10-CM | POA: Diagnosis not present

## 2023-05-31 LAB — ECHOCARDIOGRAM COMPLETE
Area-P 1/2: 2.58 cm2
S' Lateral: 3.8 cm

## 2023-06-01 ENCOUNTER — Encounter: Payer: Self-pay | Admitting: Cardiology

## 2023-06-01 NOTE — Progress Notes (Signed)
 Patient needs office visit with me, to discuss his echocardiogram.

## 2023-06-14 DIAGNOSIS — H34832 Tributary (branch) retinal vein occlusion, left eye, with macular edema: Secondary | ICD-10-CM | POA: Diagnosis not present

## 2023-07-05 ENCOUNTER — Ambulatory Visit: Payer: PPO | Attending: Cardiology | Admitting: Cardiology

## 2023-07-05 ENCOUNTER — Encounter: Payer: Self-pay | Admitting: Cardiology

## 2023-07-05 VITALS — BP 132/82 | HR 54 | Resp 16 | Ht 72.0 in | Wt 182.8 lb

## 2023-07-05 DIAGNOSIS — G479 Sleep disorder, unspecified: Secondary | ICD-10-CM | POA: Diagnosis not present

## 2023-07-05 DIAGNOSIS — I340 Nonrheumatic mitral (valve) insufficiency: Secondary | ICD-10-CM

## 2023-07-05 DIAGNOSIS — Z95818 Presence of other cardiac implants and grafts: Secondary | ICD-10-CM

## 2023-07-05 DIAGNOSIS — I480 Paroxysmal atrial fibrillation: Secondary | ICD-10-CM | POA: Diagnosis not present

## 2023-07-05 DIAGNOSIS — Z79899 Other long term (current) drug therapy: Secondary | ICD-10-CM | POA: Diagnosis not present

## 2023-07-05 DIAGNOSIS — R0609 Other forms of dyspnea: Secondary | ICD-10-CM

## 2023-07-05 DIAGNOSIS — I341 Nonrheumatic mitral (valve) prolapse: Secondary | ICD-10-CM | POA: Diagnosis not present

## 2023-07-05 NOTE — Progress Notes (Signed)
 Cardiology Office Note:  .   Date:  07/05/2023  ID:  Terry [name], DOB 01/08/1951, MRN [id number] PCP: Jimmey Mould, MD   HeartCare Providers Cardiologist:  Knox Perl, MD Electrophysiologist:  Boyce Byes, MD   History of Present Illness: .   Terry [name] is a 73 y.o. 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   Over the past few months he has noticed worsening dyspnea on exertion and sometimes has noticed mild orthopnea.  No chest pain.  He has had occasional episodes of breakthrough atrial fibrillation.  Discussed the use of AI scribe software for clinical note transcription with the patient, who gave verbal consent to proceed.  History of Present Illness The patient, in his seventies, presents with increased shortness of breath and decreased exercise tolerance. He reports difficulty keeping up with his family during a recent trip to [name], noting that he was slower than in previous years. The patient's daughter also reports that he has been experiencing sleep disturbances, including agitation and noises during the night. These symptoms have been ongoing for some time, but have recently become more noticeable. The patient has a history of heart issues, including a previous stroke and atrial fibrillation, for which he is on dofetilide . He also has a history of sleep apnea, which was evaluated years ago. The patient's daughter also mentions a recent rash on the patient's legs, which appeared about two weeks ago. The patient also reports some leg swelling, particularly after a recent plane  trip.  Labs    Lab Results  Component Value Date   HGBA1C 5.5 08/07/2021    Lab Results  Component Value Date   TSH 3.390 05/31/2017    External Labs:  Care everywhere Labs 04/07/2023:  Hb 16.0/HCT 47.9, platelets 203.  HCT 47.9 39.0-52.0 %   MCV 90.1 80.0-94.0 fL   MCH 30.0 27.0-33.0 pg   MCHC 33.3 32.0-36.0 g/dL   RDW 40.9 81.1-91.4 %   PLT 203 150-400 K/uL   Glucose 85 70-99 mg/dL   BUN 24 7-82 mg/dL   Creatinine 9.56 2.13-0.86 mg/dl   [id number] 69  Sodium 952 136-145 mmol/L   Potassium 4.6 3.5-5.5 mmol/L TBIL 1.1 0.3-1.0 mg/dL   ALP 39 84-132 U/L   AST 19 0-39 U/L   ALT 15 0-52 U/L   Cholesterol 234 <200 mg/dL   CHOL/HDL 5.1 4.4-0.1 Ratio   HDLD 46  Triglyceride 156 0-199 mg/dL   NHDL 027  LDL Chol Calc (NIH) 159 0-99 mg/dL   Review of Systems  Cardiovascular:  Positive for dyspnea on exertion and orthopnea. Negative for chest pain and leg swelling.   Physical Exam:   VS:  BP 132/82 (BP Location: Left Arm, Patient Position: Sitting, Cuff Size: Normal)   Pulse (!) 54   Resp 16   Ht 6' (1.829 m)   Wt 182 lb 12.8 oz (82.9 kg)   SpO2 97%   BMI 24.79 kg/m    Wt Readings from Last 3 Encounters:  07/05/23 182 lb 12.8 oz (82.9 kg)  11/29/22 177 lb 3.2 oz (80.4 kg)  05/26/22 175 lb (79.4 kg)    Physical Exam Neck:  Vascular: No JVD.  Cardiovascular:     Rate and Rhythm: Normal rate and regular rhythm.     Pulses: Intact distal pulses.     Heart sounds: S1 normal and S2 normal. Murmur heard.     Blowing holosystolic murmur is present with a grade of 3/6 at the apex.     No gallop.  Pulmonary:     Effort: Pulmonary effort is normal.     Breath sounds: Normal breath sounds.  Abdominal:     General: Bowel sounds are normal.     Palpations: Abdomen is soft.  Musculoskeletal:        General: Deformity: 1+ pitting ankle.     Right lower leg: Edema (1-2+ pitting ankle) present.     Left lower leg: Edema present.    Studies Reviewed: Aaron Aas     Coronary CTA 11/10/2017: 1. Calcium  score 5 isolated area in mid LAD this is 26 th percentile for age and sex.  2.  Normal coronary arteries.  3.  Mild aortic root dilatation 4.0 cm.  ECHOCARDIOGRAM COMPLETE 05/31/2023  1. Left ventricular ejection fraction, by estimation, is 55 to 60%. Left ventricular ejection fraction by 3D volume is 56 %. The left ventricle has normal function. The left ventricle has no regional wall motion abnormalities. Left ventricular diastolic parameters were normal. The average left ventricular global longitudinal strain is -17.6 %. The global longitudinal strain is normal. 2. Right ventricular systolic function is normal. The right ventricular size is normal. 3. Left atrial size was mild to moderately dilated. 4. The mitral valve is abnormal. Moderate to severe mitral valve regurgitation. There is moderate holosystolic prolapse of the middle scallop of the posterior leaflet of the mitral valve. 5. The aortic valve is tricuspid. Aortic valve regurgitation is not visualized. No aortic stenosis is present. 6. There is borderline dilatation of the ascending aorta, measuring 39 mm. 7. The inferior vena cava is normal in size with greater than 50% respiratory variability, suggesting right atrial pressure of 3 mmHg.   Comparison(s): Mitral regurgitation appears to be worse, but quantitation is difficult due to the eccenticity of the jet. Suspect ruptured chord/flail P2 (middle) scallop of the posterior mitral leaflet. EKG:    EKG Interpretation Date/Time:  Tuesday July 05 2023 08:38:41 EDT Ventricular Rate:  53 PR Interval:  198 QRS Duration:  90 QT Interval:  474 QTC Calculation: 444 R Axis:   23  Text Interpretation: EKG 07/05/2023: Sinus bradycardia at rate of 53 bpm, normal EKG.  Compared to 10/30/2021, inferior T wave inversion no longer present. Confirmed by Maudell Stanbrough, Jagadeesh ([id number]) on 07/05/2023 8:48:24 AM    Medications and allergies    Allergies  Allergen  Reactions   Losartan  Other (See Comments)    Makes BP drop very low   Penicillins Other (See Comments)    Childhood reaction Has patient had a PCN reaction causing immediate rash, facial/tongue/throat swelling, SOB or lightheadedness with hypotension: yes Has patient had a PCN reaction causing severe rash involving mucus membranes or skin necrosis: no Has patient had a PCN reaction that required hospitalization: no Has patient had a PCN reaction occurring within the last 10 years: no If all of the above answers are "NO", then may proceed with Cephalosporin use.     Current Outpatient Medications:    acetaminophen  (TYLENOL ) 500 MG tablet, Take 1,000 mg by mouth every 4 (four) hours as needed for moderate pain, headache or fever., Disp: , Rfl:    aspirin  EC 81 MG  tablet, Take 81 mg by mouth daily. Swallow whole., Disp: , Rfl:    Cholecalciferol (VITAMIN D3 ULTRA STRENGTH) 125 MCG (5000 UT) capsule, Take 5,000 Units by mouth at bedtime., Disp: , Rfl:    dofetilide  (TIKOSYN ) 250 MCG capsule, TAKE 1 CAPSULE BY MOUTH TWICE A DAY, Disp: 180 capsule, Rfl: 3   ezetimibe  (ZETIA ) 10 MG tablet, TAKE 1 TABLET BY MOUTH EVERY DAY, Disp: 90 tablet, Rfl: 1   fluticasone  (FLONASE ) 50 MCG/ACT nasal spray, Place 2 sprays into both nostrils every evening. , Disp: , Rfl:    magnesium  oxide (MAG-OX) 400 (240 Mg) MG tablet, TAKE 1 TABLET BY MOUTH EVERY DAY, Disp: 120 tablet, Rfl: 2   Menthol, Topical Analgesic, (BENGAY EX), Apply 1 Application topically 4 (four) times daily as needed (soreness/pain.)., Disp: , Rfl:    metoprolol  succinate (TOPROL -XL) 50 MG 24 hr tablet, TAKE 1 TABLET BY MOUTH EVERY DAY, Disp: 90 tablet, Rfl: 2   Povidone, PF, (IVIZIA DRY EYES) 0.5 % SOLN, Place 1 drop into both eyes in the morning and at bedtime. iVIZIA Lubricant Eye Gel for Severe and Nighttime Dry Eye Relief, Disp: , Rfl:    Probiotic Product (PROBIOTIC DAILY PO), Take 1 capsule by mouth in the morning. TruNature Advanced  Digestive Probiotic, Disp: , Rfl:    rosuvastatin  (CRESTOR ) 20 MG tablet, Take one tablet by mouth three times a week, Disp: 15 tablet, Rfl: 6   guaiFENesin (MUCINEX) 600 MG 12 hr tablet, Take 600 mg by mouth 2 (two) times daily as needed for cough. (Patient not taking: Reported on 07/05/2023), Disp: , Rfl:    zinc gluconate 50 MG tablet, Take 50 mg by mouth at bedtime., Disp: , Rfl:    No orders of the defined types were placed in this encounter.    There are no discontinued medications.   ASSESSMENT AND PLAN: .      ICD-10-CM   1. MVP (mitral valve prolapse)  I34.1 CBC    Basic Metabolic Panel (BMET)    Magnesium     2. Moderate to severe mitral regurgitation  I34.0 CBC    Basic Metabolic Panel (BMET)    Magnesium     3. Dyspnea on exertion  R06.09 CBC    Basic Metabolic Panel (BMET)    Magnesium     4. Paroxysmal atrial fibrillation (HCC)  I48.0 EKG 12-Lead    CBC    Basic Metabolic Panel (BMET)    Magnesium     5. Disturbance of sleep  G47.9 Ambulatory referral to Neurology    6. High risk medication use-dofetilide   Z79.899 CBC    Basic Metabolic Panel (BMET)    Magnesium     7. 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 An echocardiogram shows significant valve involvement, requiring surgical intervention.  He now has symptomatic mitral regurgitation with decreased exercise tolerance, dyspnea on exertion and occasional episodes of mild orthopnea. Cardiac catheterization and TEE are necessary to assess coronary arteries, valve involvement, and the left atrial appendage watchman device.   Schedule for cardiac catheterization, and possible angioplasty. We discussed regarding risks, benefits, alternatives to this including stress testing, CTA  and continued medical therapy. Patient wants to proceed. Understands <1-2% risk of death, stroke, MI, urgent CABG, bleeding, infection, renal failure  but not limited to these.  TEE risks include throat discomfort, rare pneumonia, and extremely rare esophageal tear. Order right and left heart catheterization to evaluate coronary arteries and valve involvement. Order TEE to assess the mitral valve and left atrial appendage watchman device. Coordinate TEE and cardiac catheterization on the same day to minimize hospital visits.  Atrial fibrillation   He experiences intermittent atrial fibrillation, with a recent episode on June 18, 2023, after a retinal injection for central retinal vein occlusion.  The episode lasted about two hours and resolved spontaneously. He is currently maintained on dofetilide  with no recent persistent episodes. Continue dofetilide  for atrial fibrillation management. Instruct him to call immediately if an atrial fibrillation episode persists for hours for potential cardioversion.  Will check magnesium  levels with precardiac catheterization labs.  He is on high risk medication use with dofetilide  but fortunately EKG remains stable with normal QT interval.  Leg swelling   He has intermittent leg swelling, particularly noted during recent travel. The swelling likely contributed to a petechial rash on his legs due to capillary rupture. Advise using compression stockings during travel and prolonged immobility.  Petechial rash on legs   The petechial rash on his legs is likely secondary to capillary rupture from leg swelling. The rash is localized to the legs, reducing concern for systemic causes. Advise monitoring the rash for changes or spread.  Sleep disturbance Patient has jerking movements, makes noises at night when sleeping, I do not know if he has movement disorder as well, will refer him to see Dr. Raoul Byes Dohmeier for evaluation of the same.  Patient has had remote sleep study  and was negative for sleep apnea.   Signed,  [place] Perl, MD, [facility] 07/05/2023, 5:12 PM [facility] [address] [place], [place] [address] Phone: [phone]. Fax:  [phone]

## 2023-07-05 NOTE — Patient Instructions (Addendum)
 Medication Instructions:  Your physician recommends that you continue on your current medications as directed. Please refer to the Current Medication list given to you today.  *If you need a refill on your cardiac medications before your next appointment, please call your pharmacy*  Lab Work: Have lab work drawn today at [facility] on the first floor--BMP, CBC, Magnesium  If you have labs (blood work) drawn today and your tests are completely normal, you will receive your results only by: MyChart Message (if you have MyChart) OR A paper copy in the mail If you have any lab test that is abnormal or we need to change your treatment, we will call you to review the results.  Testing/Procedures: Your physician has requested that you have a TEE. During a TEE, sound waves are used to create images of your heart. It provides your doctor with information about the size and shape of your heart and how well your heart's chambers and valves are working. In this test, a transducer is attached to the end of a flexible tube that's guided down your throat and into your esophagus (the tube leading from you mouth to your stomach) to get a more detailed image of your heart. You are not awake for the procedure. Please see the instruction sheet given to you today. For further information please visit [email or web address]  Your physician has requested that you have a cardiac catheterization. Cardiac catheterization is used to diagnose and/or treat various heart conditions. Doctors may recommend this procedure for a number of different reasons. The most common reason is to evaluate chest pain. Chest pain can be a symptom of coronary artery disease (CAD), and cardiac catheterization can show whether plaque is narrowing or blocking your heart's arteries. This procedure is also used to evaluate the valves, as well as measure the blood flow and oxygen levels in different parts of your heart. For further information please visit  [email or web address] Please follow instruction sheet, as given.   Follow-Up: At [facility], you and your health needs are our priority.  As part of our continuing mission to provide you with exceptional heart care, our providers are all part of one team.  This team includes your primary Cardiologist (physician) and Advanced Practice Providers or APPs (Physician Assistants and Nurse Practitioners) who all work together to provide you with the care you need, when you need it.  Your next appointment:   3 month(s)  Provider:   Dr Berry Bristol    We recommend signing up for the patient portal called "MyChart".  Sign up information is provided on this After Visit Summary.  MyChart is used to connect with patients for Virtual Visits (Telemedicine).  Patients are able to view lab/test results, encounter notes, upcoming appointments, etc.  Non-urgent messages can be sent to your provider as well.   To learn more about what you can do with MyChart, go to [email or web address].   Other Instructions     Dear [place] [name]  You are scheduled for a TEE (Transesophageal Echocardiogram) on Wednesday, April 30 with Dr. Chancy Comber.  Please arrive at the [facility] (Main Entrance A) at [facility]: [address] [place], [place] [address] at 7:30 AM (This time is 1 hour(s) before your procedure to ensure your preparation).   Free valet parking service is available. You will check in at ADMITTING.   *Please Note: You will receive a call the day before your procedure to confirm the appointment time. That time may have changed from the original  time based on the schedule for that day.*    DIET:  Nothing to eat or drink after midnight except a sip of water with medications (see medication instructions below)  MEDICATION INSTRUCTIONS: !!IF ANY NEW MEDICATIONS ARE STARTED AFTER TODAY, PLEASE NOTIFY YOUR PROVIDER AS SOON AS POSSIBLE!!  FYI: Medications such as Semaglutide (Ozempic, [place]),  Tirzepatide (Mounjaro, Zepbound), Dulaglutide (Trulicity), etc ("GLP1 agonists") AND Canagliflozin (Invokana), Dapagliflozin (Farxiga), Empagliflozin (Jardiance), Ertugliflozin (Steglatro), Bexagliflozin [employer]) or any combination with one of these drugs such as Invokamet (Canagliflozin/Metformin), Synjardy (Empagliflozin/Metformin), etc ("SGLT2 inhibitors") must be held around the time of a procedure. This is not a comprehensive list of all of these drugs. Please review all of your medications and talk to your provider if you take any one of these. If you are not sure, ask your provider.    LABS: to be done on April 22  FYI:  For your safety, and to allow us  to monitor your vital signs accurately during the surgery/procedure we request: If you have artificial nails, gel coating, SNS etc, please have those removed prior to your surgery/procedure. Not having the nail coverings /polish removed may result in cancellation or delay of your surgery/procedure.  Your support person will be asked to wait in the waiting room during your procedure.  It is OK to have someone drop you off and come back when you are ready to be discharged.  You cannot drive after the procedure and will need someone to drive you home.  Bring your insurance cards.  *Special Note: Every effort is made to have your procedure done on time. Occasionally there are emergencies that occur at the hospital that may cause delays. Please be patient if a delay does occur.     [facility] DEPT OF [facility] AT [facility] [address] [place], [address] [place] [address] Dept: [phone] Loc: [phone]  LOC FEINSTEIN  07/05/2023  You are scheduled for a Cardiac Catheterization on Wednesday, April 30 with Dr. Ganji  1. Please arrive at the [facility] (Main Entrance A) at [facility]: [address] [place], [place] [address] at 7:30 AM  Free valet parking  service is available. You will check in at ADMITTING. The support person will be asked to wait in the waiting room.  It is OK to have someone drop you off and come back when you are ready to be discharged.    Special note: Every effort is made to have your procedure done on time. Please understand that emergencies sometimes delay scheduled procedures.  2. Diet: Do not eat or drink after midnight prior to procedure.  May have sip of water with your medications  3. Labs: You will need to have blood drawn today--4/22, you do not need to be fasting.  4. Medication instructions in preparation for your procedure:   Contrast Allergy: No   On the morning of your procedure, take your Aspirin  81 mg and any morning medicines NOT listed above.  You may use sips of water.  5. Plan to go home the same day, you will only stay overnight if medically necessary. 6. Bring a current list of your medications and current insurance cards. 7. You MUST have a responsible person to drive you home. 8. Someone MUST be with you the first 24 hours after you arrive home or your discharge will be delayed. 9. Please wear clothes that are easy to get on and off and  wear slip-on shoes.  Thank you for allowing us  to care for you!   -- [facility]       1st Floor: - Lobby - Registration  - Pharmacy  - Lab - Cafe  2nd Floor: - PV Lab - Diagnostic Testing (echo, CT, nuclear med)  3rd Floor: - Vacant  4th Floor: - TCTS (cardiothoracic surgery) - AFib Clinic - Structural Heart Clinic - Vascular Surgery  - Vascular Ultrasound  5th Floor: - HeartCare Cardiology (general and EP) - Clinical Pharmacy for coumadin, hypertension, lipid, weight-loss medications, and med management appointments    Valet parking services will be available as well.    You may go to any of these [facility] locations:   [facility] - [address] ([facility]) - [address] [phone] [address]   [place] - [address]    [place]  - [address]    [place] - [address] - [address] [employer]  - [address] [place] - [address] ([facility])  [place]   - [address], [address]

## 2023-07-06 ENCOUNTER — Encounter: Payer: Self-pay | Admitting: Cardiology

## 2023-07-06 LAB — MAGNESIUM: Magnesium: 2.1 mg/dL (ref 1.6–2.3)

## 2023-07-06 LAB — BASIC METABOLIC PANEL WITH GFR
BUN/Creatinine Ratio: 14 (ref 10–24)
BUN: 16 mg/dL (ref 8–27)
CO2: 24 mmol/L (ref 20–29)
Calcium: 9.1 mg/dL (ref 8.6–10.2)
Chloride: 105 mmol/L (ref 96–106)
Creatinine, Ser: 1.14 mg/dL (ref 0.76–1.27)
Glucose: 102 mg/dL — ABNORMAL HIGH (ref 70–99)
Potassium: 4.1 mmol/L (ref 3.5–5.2)
Sodium: 142 mmol/L (ref 134–144)
eGFR: 69 mL/min/{1.73_m2} (ref >59)

## 2023-07-06 LAB — CBC
Hematocrit: 47.7 % (ref 37.5–51.0)
Hemoglobin: 15.2 g/dL (ref 13.0–17.7)
MCH: 29.4 pg (ref 26.6–33.0)
MCHC: 31.9 g/dL (ref 31.5–35.7)
MCV: 92 fL (ref 79–97)
Platelets: 184 10*3/uL (ref 150–450)
RBC: 5.17 x10E6/uL (ref 4.14–5.80)
RDW: 13.1 % (ref 11.6–15.4)
WBC: 5.2 10*3/uL (ref 3.4–10.8)

## 2023-07-06 NOTE — H&P (View-Only) (Signed)
 Labs stable for heart catheterization. Chronically elevated potassium

## 2023-07-06 NOTE — Progress Notes (Signed)
 Labs stable for heart catheterization. Chronically elevated potassium

## 2023-07-12 ENCOUNTER — Telehealth: Payer: Self-pay | Admitting: *Deleted

## 2023-07-12 NOTE — Progress Notes (Signed)
 Spoke to patient and instructed them to come at 06:45  and to be NPO after 0000. Medications reviewed.    Confirmed that patient will have a ride home and someone to stay with them for 24 hours after the procedure.

## 2023-07-12 NOTE — Telephone Encounter (Addendum)
 Cardiac Catheterization scheduled at [facility] for: Wednesday July 13, 2023 10:00 AM/TEE 8:00 AM Arrival time [facility] Main Entrance A at: 7:00 AM  Nothing to eat or drink after midnight prior to procedures.  Medication instructions: -Usual morning medications can be taken with sips of water including aspirin  81 mg.  Plan to go home the same day, you will only stay overnight if medically necessary.  You must have responsible adult to drive you home.  Someone must be with you the first 24 hours after you arrive home.   Reviewed procedure instructions with patient.

## 2023-07-13 ENCOUNTER — Ambulatory Visit (HOSPITAL_COMMUNITY)

## 2023-07-13 ENCOUNTER — Ambulatory Visit (HOSPITAL_COMMUNITY)
Admission: RE | Admit: 2023-07-13 | Discharge: 2023-07-13 | Disposition: A | Discharge status: Home / Self Care | Attending: Internal Medicine | Admitting: Internal Medicine

## 2023-07-13 ENCOUNTER — Other Ambulatory Visit: Payer: Self-pay

## 2023-07-13 ENCOUNTER — Ambulatory Visit (HOSPITAL_BASED_OUTPATIENT_CLINIC_OR_DEPARTMENT_OTHER)
Admission: RE | Admit: 2023-07-13 | Discharge: 2023-07-13 | Disposition: A | Discharge status: Home / Self Care | Source: Ambulatory Visit | Attending: Cardiology | Admitting: Cardiology

## 2023-07-13 ENCOUNTER — Encounter (HOSPITAL_COMMUNITY)
Admission: RE | Disposition: A | Discharge status: Home / Self Care | Payer: Self-pay | Source: Home / Self Care | Attending: Internal Medicine

## 2023-07-13 ENCOUNTER — Encounter (HOSPITAL_COMMUNITY): Payer: Self-pay | Admitting: Internal Medicine

## 2023-07-13 DIAGNOSIS — G479 Sleep disorder, unspecified: Secondary | ICD-10-CM | POA: Diagnosis not present

## 2023-07-13 DIAGNOSIS — I110 Hypertensive heart disease with heart failure: Secondary | ICD-10-CM | POA: Diagnosis not present

## 2023-07-13 DIAGNOSIS — I509 Heart failure, unspecified: Secondary | ICD-10-CM

## 2023-07-13 DIAGNOSIS — Z95818 Presence of other cardiac implants and grafts: Secondary | ICD-10-CM | POA: Diagnosis not present

## 2023-07-13 DIAGNOSIS — I341 Nonrheumatic mitral (valve) prolapse: Secondary | ICD-10-CM | POA: Insufficient documentation

## 2023-07-13 DIAGNOSIS — K219 Gastro-esophageal reflux disease without esophagitis: Secondary | ICD-10-CM | POA: Diagnosis not present

## 2023-07-13 DIAGNOSIS — I5022 Chronic systolic (congestive) heart failure: Secondary | ICD-10-CM | POA: Diagnosis not present

## 2023-07-13 DIAGNOSIS — Z79899 Other long term (current) drug therapy: Secondary | ICD-10-CM | POA: Insufficient documentation

## 2023-07-13 DIAGNOSIS — I480 Paroxysmal atrial fibrillation: Secondary | ICD-10-CM | POA: Insufficient documentation

## 2023-07-13 DIAGNOSIS — Z7901 Long term (current) use of anticoagulants: Secondary | ICD-10-CM | POA: Diagnosis not present

## 2023-07-13 DIAGNOSIS — I340 Nonrheumatic mitral (valve) insufficiency: Secondary | ICD-10-CM | POA: Diagnosis not present

## 2023-07-13 DIAGNOSIS — I639 Cerebral infarction, unspecified: Secondary | ICD-10-CM | POA: Diagnosis not present

## 2023-07-13 HISTORY — PX: TRANSESOPHAGEAL ECHOCARDIOGRAM (CATH LAB): EP1270

## 2023-07-13 HISTORY — PX: RIGHT/LEFT HEART CATH AND CORONARY ANGIOGRAPHY: CATH118266

## 2023-07-13 LAB — POCT I-STAT EG7
Acid-Base Excess: 0 mmol/L (ref 0.0–2.0)
Acid-base deficit: 1 mmol/L (ref 0.0–2.0)
Bicarbonate: 25 mmol/L (ref 20.0–28.0)
Bicarbonate: 25.5 mmol/L (ref 20.0–28.0)
Calcium, Ion: 1.21 mmol/L (ref 1.15–1.40)
Calcium, Ion: 1.23 mmol/L (ref 1.15–1.40)
HCT: 42 % (ref 39.0–52.0)
HCT: 42 % (ref 39.0–52.0)
Hemoglobin: 14.3 g/dL (ref 13.0–17.0)
Hemoglobin: 14.3 g/dL (ref 13.0–17.0)
O2 Saturation: 71 %
O2 Saturation: 72 %
Potassium: 3.7 mmol/L (ref 3.5–5.1)
Potassium: 3.7 mmol/L (ref 3.5–5.1)
Sodium: 141 mmol/L (ref 135–145)
Sodium: 142 mmol/L (ref 135–145)
TCO2: 26 mmol/L (ref 22–32)
TCO2: 27 mmol/L (ref 22–32)
pCO2, Ven: 43.4 mmHg — ABNORMAL LOW (ref 44–60)
pCO2, Ven: 43.4 mmHg — ABNORMAL LOW (ref 44–60)
pH, Ven: 7.368 (ref 7.25–7.43)
pH, Ven: 7.376 (ref 7.25–7.43)
pO2, Ven: 38 mmHg (ref 32–45)
pO2, Ven: 39 mmHg (ref 32–45)

## 2023-07-13 LAB — POCT I-STAT 7, (LYTES, BLD GAS, ICA,H+H)
Acid-base deficit: 1 mmol/L (ref 0.0–2.0)
Bicarbonate: 23.4 mmol/L (ref 20.0–28.0)
Calcium, Ion: 1.23 mmol/L (ref 1.15–1.40)
HCT: 43 % (ref 39.0–52.0)
Hemoglobin: 14.6 g/dL (ref 13.0–17.0)
O2 Saturation: 94 %
Potassium: 3.7 mmol/L (ref 3.5–5.1)
Sodium: 141 mmol/L (ref 135–145)
TCO2: 25 mmol/L (ref 22–32)
pCO2 arterial: 38.8 mmHg (ref 32–48)
pH, Arterial: 7.388 (ref 7.35–7.45)
pO2, Arterial: 73 mmHg — ABNORMAL LOW (ref 83–108)

## 2023-07-13 SURGERY — RIGHT/LEFT HEART CATH AND CORONARY ANGIOGRAPHY
Anesthesia: LOCAL

## 2023-07-13 SURGERY — TRANSESOPHAGEAL ECHOCARDIOGRAM (TEE) (CATHLAB)
Anesthesia: Monitor Anesthesia Care

## 2023-07-13 MED ORDER — SODIUM CHLORIDE 0.9 % IV SOLN
INTRAVENOUS | Status: DC
Start: 2023-07-13 — End: 2023-07-13

## 2023-07-13 MED ORDER — ASPIRIN 81 MG PO CHEW: 81.0000 mg | CHEWABLE_TABLET | ORAL | Status: DC

## 2023-07-13 MED ORDER — HEPARIN (PORCINE) IN NACL 1000-0.9 UT/500ML-% IV SOLN
INTRAVENOUS | Status: DC | PRN
  Administered 2023-07-13 (×2): 500 mL

## 2023-07-13 MED ORDER — LIDOCAINE HCL (PF) 1 % IJ SOLN
INTRAMUSCULAR | Status: AC
  Filled 2023-07-13: qty 30

## 2023-07-13 MED ORDER — HEPARIN SODIUM (PORCINE) 1000 UNIT/ML IJ SOLN
INTRAMUSCULAR | Status: DC | PRN
  Administered 2023-07-13: 4000 [IU] via INTRAVENOUS

## 2023-07-13 MED ORDER — IOHEXOL 350 MG/ML SOLN
INTRAVENOUS | Status: DC | PRN
  Administered 2023-07-13: 18 mL

## 2023-07-13 MED ORDER — FENTANYL CITRATE (PF) 100 MCG/2ML IJ SOLN
INTRAMUSCULAR | Status: DC | PRN
  Administered 2023-07-13: 25 ug via INTRAVENOUS

## 2023-07-13 MED ORDER — SODIUM CHLORIDE 0.9 % IV SOLN: INTRAVENOUS | Status: DC

## 2023-07-13 MED ORDER — MIDAZOLAM HCL 2 MG/2ML IJ SOLN
INTRAMUSCULAR | Status: DC | PRN
  Administered 2023-07-13: 2 mg via INTRAVENOUS

## 2023-07-13 MED ORDER — HEPARIN SODIUM (PORCINE) 1000 UNIT/ML IJ SOLN
INTRAMUSCULAR | Status: AC
Start: 2023-07-13 — End: ?
  Filled 2023-07-13: qty 10

## 2023-07-13 MED ORDER — VERAPAMIL HCL 2.5 MG/ML IV SOLN
INTRAVENOUS | Status: DC | PRN
  Administered 2023-07-13: 10 mL via INTRA_ARTERIAL

## 2023-07-13 MED ORDER — MIDAZOLAM HCL 2 MG/2ML IJ SOLN
INTRAMUSCULAR | Status: AC
  Filled 2023-07-13: qty 2

## 2023-07-13 MED ORDER — LIDOCAINE HCL (PF) 1 % IJ SOLN
INTRAMUSCULAR | Status: DC | PRN
  Administered 2023-07-13: 2 mL via INTRADERMAL

## 2023-07-13 MED ORDER — FENTANYL CITRATE (PF) 100 MCG/2ML IJ SOLN
INTRAMUSCULAR | Status: AC
  Filled 2023-07-13: qty 2

## 2023-07-13 MED ORDER — PROPOFOL 500 MG/50ML IV EMUL
INTRAVENOUS | Status: DC | PRN
  Administered 2023-07-13: 150 ug/kg/min via INTRAVENOUS

## 2023-07-13 SURGICAL SUPPLY — 9 items
CATH INFINITI 5 FR JL3.5 (CATHETERS) IMPLANT
CATH INFINITI AMBI 5FR TG (CATHETERS) IMPLANT
CATH SWAN GANZ 7F STRAIGHT (CATHETERS) IMPLANT
DEVICE RAD COMP TR BAND LRG (VASCULAR PRODUCTS) IMPLANT
GLIDESHEATH SLEND A-KIT 6F 22G (SHEATH) IMPLANT
GLIDESHEATH SLENDER 7FR .021G (SHEATH) IMPLANT
GUIDEWIRE INQWIRE 1.5J.035X260 (WIRE) IMPLANT
PACK CARDIAC CATHETERIZATION (CUSTOM PROCEDURE TRAY) ×1 IMPLANT
SET ATX-X65L (MISCELLANEOUS) IMPLANT

## 2023-07-13 NOTE — Discharge Instructions (Signed)

## 2023-07-13 NOTE — Anesthesia Preprocedure Evaluation (Signed)
 Anesthesia Evaluation  Patient identified by MRN, date of birth, ID band Patient awake    Reviewed: Allergy & Precautions, NPO status , Patient's Chart, lab work & pertinent test results, reviewed documented beta blocker date and time   History of Anesthesia Complications Negative for: history of anesthetic complications  Airway Mallampati: II  TM Distance: >3 FB     Dental no notable dental hx.    Pulmonary neg COPD, neg PE   breath sounds clear to auscultation       Cardiovascular hypertension, +CHF  (-) CAD, (-) Past MI and (-) Cardiac Stents + Valvular Problems/Murmurs (severe mr) MR  Rhythm:Regular Rate:Normal     Neuro/Psych neg Seizures  Anxiety     CVA (residual cognitive/mentation issues), Residual Symptoms    GI/Hepatic ,GERD  ,,(+) neg Cirrhosis        Endo/Other    Renal/GU Renal disease     Musculoskeletal   Abdominal   Peds  Hematology   Anesthesia Other Findings   Reproductive/Obstetrics                              Anesthesia Physical Anesthesia Plan  ASA: 3  Anesthesia Plan: MAC   Post-op Pain Management:    Induction: Intravenous  PONV Risk Score and Plan: 1 and Propofol  infusion  Airway Management Planned: Natural Airway and Simple Face Mask  Additional Equipment:   Intra-op Plan:   Post-operative Plan: Extubation in OR  Informed Consent: I have reviewed the patients History and Physical, chart, labs and discussed the procedure including the risks, benefits and alternatives for the proposed anesthesia with the patient or authorized representative who has indicated his/her understanding and acceptance.     Dental advisory given  Plan Discussed with: CRNA  Anesthesia Plan Comments:          Anesthesia Quick Evaluation

## 2023-07-13 NOTE — CV Procedure (Addendum)
 INDICATIONS: Mitral valve regurgitation/mitral valve prolapse.  PROCEDURE:   Informed consent was obtained prior to the procedure. The risks, benefits and alternatives for the procedure were discussed and the patient comprehended these risks.  Risks include, but are not limited to, cough, sore throat, vomiting, nausea, somnolence, esophageal and stomach trauma or perforation, bleeding, low blood pressure, aspiration, pneumonia, infection, trauma to the teeth and death.    After a procedural time-out, the oropharynx was anesthetized with 20% benzocaine  spray.   During this procedure the patient was administered propofol  per anesthesia.  The patient's heart rate, blood pressure, and oxygen saturation were monitored continuously during the procedure. The period of conscious sedation was 30 minutes, of which I was present face-to-face 100% of this time.  The transesophageal probe was inserted in the esophagus and stomach without difficulty and multiple views were obtained.  The patient was kept under observation until the patient left the procedure room.  The patient left the procedure room in stable condition.   Agitated microbubble saline contrast was not administered.  COMPLICATIONS:    There were no immediate complications.  FINDINGS:   FORMAL ECHOCARDIOGRAM REPORT PENDING EF 55% Normal RV function Severe, eccentric mitral valve regurgitation with primarily flail P2 segment with apparent torn chord and probable small flail component of P1. Systolic reversals noted in pulmonary veins.  Trivial TR Trivial AI Trivial PI Residual atrial level shunt with L-R flow, small. Likely iatrogenic s/p transseptal puncture.   RECOMMENDATIONS:     To cath lab when alert  Time Spent Directly with the Patient:  45 minutes   Terry [name] A [name] 07/13/2023, 8:44 AM

## 2023-07-13 NOTE — Progress Notes (Signed)
 Patient and daughter was given discharge instructions. Both verbalized understanding.

## 2023-07-13 NOTE — Interval H&P Note (Signed)
 History and Physical Interval Note:  07/13/2023 7:44 AM  Terry [name]  has presented today for surgery, with the diagnosis of MITRAL REGURGITATION.  The various methods of treatment have been discussed with the patient and family. After consideration of risks, benefits and other options for treatment, the patient has consented to  Procedure(s): TRANSESOPHAGEAL ECHOCARDIOGRAM (N/A) as a surgical intervention.  The patient's history has been reviewed, patient examined, no change in status, stable for surgery.  I have reviewed the patient's chart and labs.  Questions were answered to the patient's satisfaction.     Nemesio Castrillon A Wright Gravely

## 2023-07-13 NOTE — Progress Notes (Signed)
  Echocardiogram Echocardiogram Transesophageal has been performed.  Terry [name] 07/13/2023, 9:08 AM

## 2023-07-13 NOTE — Anesthesia Postprocedure Evaluation (Signed)
 Anesthesia Post Note  Patient: Terry [name]  Procedure(s) Performed: TRANSESOPHAGEAL ECHOCARDIOGRAM     Patient location during evaluation: PACU Anesthesia Type: MAC Level of consciousness: awake and alert Pain management: pain level controlled Vital Signs Assessment: post-procedure vital signs reviewed and stable Respiratory status: spontaneous breathing, nonlabored ventilation, respiratory function stable and patient connected to nasal cannula oxygen Cardiovascular status: stable and blood pressure returned to baseline Postop Assessment: no apparent nausea or vomiting Anesthetic complications: no   No notable events documented.  Last Vitals:  Vitals:   07/13/23 1345 07/13/23 1400  BP: (!) 165/111 (!) 155/99  Pulse: (!) 52 (!) 48  Resp:    Temp:    SpO2: 97% 96%    Last Pain:  Vitals:   07/13/23 1310  TempSrc:   PainSc: 0-No pain                 [name]

## 2023-07-13 NOTE — Transfer of Care (Signed)
 Immediate Anesthesia Transfer of Care Note  Patient: Terry [name]  Procedure(s) Performed: TRANSESOPHAGEAL ECHOCARDIOGRAM  Patient Location: PACU and Cath Lab  Anesthesia Type:MAC  Level of Consciousness: sedated and responds to stimulation  Airway & Oxygen Therapy: Patient Spontanous Breathing  Post-op Assessment: Report given to RN  Post vital signs: Reviewed and stable  Last Vitals:  Vitals Value Taken Time  BP    Temp    Pulse    Resp    SpO2      Last Pain:  Vitals:   07/13/23 0725  TempSrc:   PainSc: 0-No pain         Complications: No notable events documented.

## 2023-07-13 NOTE — Interval H&P Note (Signed)
 History and Physical Interval Note:  07/13/2023 12:36 PM  [name]  has presented today for surgery, with the diagnosis of mr.  The various methods of treatment have been discussed with the patient and family. After consideration of risks, benefits and other options for treatment, the patient has consented to  Procedure(s): RIGHT/LEFT HEART CATH AND CORONARY ANGIOGRAPHY (N/A) as a surgical intervention.  The patient's history has been reviewed, patient examined, no change in status, stable for surgery.  I have reviewed the patient's chart and labs.  Questions were answered to the patient's satisfaction.     Terry [name]

## 2023-07-14 ENCOUNTER — Encounter (HOSPITAL_COMMUNITY): Payer: Self-pay | Admitting: Cardiology

## 2023-07-19 DIAGNOSIS — H34832 Tributary (branch) retinal vein occlusion, left eye, with macular edema: Secondary | ICD-10-CM | POA: Diagnosis not present

## 2023-07-20 DIAGNOSIS — Z85828 Personal history of other malignant neoplasm of skin: Secondary | ICD-10-CM | POA: Diagnosis not present

## 2023-07-20 DIAGNOSIS — D229 Melanocytic nevi, unspecified: Secondary | ICD-10-CM | POA: Diagnosis not present

## 2023-07-20 DIAGNOSIS — D225 Melanocytic nevi of trunk: Secondary | ICD-10-CM | POA: Diagnosis not present

## 2023-07-20 DIAGNOSIS — L821 Other seborrheic keratosis: Secondary | ICD-10-CM | POA: Diagnosis not present

## 2023-07-20 DIAGNOSIS — D1801 Hemangioma of skin and subcutaneous tissue: Secondary | ICD-10-CM | POA: Diagnosis not present

## 2023-07-20 DIAGNOSIS — D485 Neoplasm of uncertain behavior of skin: Secondary | ICD-10-CM | POA: Diagnosis not present

## 2023-07-20 DIAGNOSIS — L578 Other skin changes due to chronic exposure to nonionizing radiation: Secondary | ICD-10-CM | POA: Diagnosis not present

## 2023-07-20 DIAGNOSIS — L814 Other melanin hyperpigmentation: Secondary | ICD-10-CM | POA: Diagnosis not present

## 2023-07-20 DIAGNOSIS — L570 Actinic keratosis: Secondary | ICD-10-CM | POA: Diagnosis not present

## 2023-07-20 DIAGNOSIS — C4441 Basal cell carcinoma of skin of scalp and neck: Secondary | ICD-10-CM | POA: Diagnosis not present

## 2023-07-21 LAB — ECHO TEE
AV Mean grad: 3 mmHg
AV Peak grad: 6 mmHg
Ao pk vel: 1.22 m/s

## 2023-07-22 ENCOUNTER — Other Ambulatory Visit: Payer: Self-pay

## 2023-07-22 ENCOUNTER — Other Ambulatory Visit: Payer: Self-pay | Admitting: Cardiology

## 2023-07-22 DIAGNOSIS — Z01818 Encounter for other preprocedural examination: Secondary | ICD-10-CM

## 2023-07-22 NOTE — Progress Notes (Signed)
 Carotid duplex for pre-cardiac surgery testing per Dr. Berry Bristol.

## 2023-07-27 ENCOUNTER — Ambulatory Visit (HOSPITAL_COMMUNITY)
Admission: RE | Admit: 2023-07-27 | Discharge: 2023-07-27 | Disposition: A | Discharge status: Home / Self Care | Source: Ambulatory Visit | Attending: Cardiology | Admitting: Cardiology

## 2023-07-27 ENCOUNTER — Telehealth: Payer: Self-pay | Admitting: Cardiology

## 2023-07-27 DIAGNOSIS — Z01818 Encounter for other preprocedural examination: Secondary | ICD-10-CM | POA: Insufficient documentation

## 2023-07-27 NOTE — Telephone Encounter (Signed)
 This patients has some questions regarding his procedure on 08/09/23. Could you please give him a call.

## 2023-07-28 NOTE — Telephone Encounter (Signed)
 I spoke with patient.  He has upcoming procedure at [facility] planned.  He spoke with [facility] yesterday and does not have any questions for Dr Berry Bristol at this time.

## 2023-07-29 ENCOUNTER — Ambulatory Visit: Payer: Self-pay | Admitting: Cardiology

## 2023-07-29 DIAGNOSIS — I480 Paroxysmal atrial fibrillation: Secondary | ICD-10-CM

## 2023-07-29 NOTE — Progress Notes (Signed)
 Carotid artery duplex essentially normal

## 2023-08-02 MED ORDER — DOFETILIDE 250 MCG PO CAPS
250.0000 ug | ORAL_CAPSULE | Freq: Two times a day (BID) | ORAL | 2 refills | Status: DC
Start: 2023-08-02 — End: 2023-10-11

## 2023-08-09 DIAGNOSIS — Z79899 Other long term (current) drug therapy: Secondary | ICD-10-CM | POA: Diagnosis not present

## 2023-08-09 DIAGNOSIS — I480 Paroxysmal atrial fibrillation: Secondary | ICD-10-CM | POA: Diagnosis not present

## 2023-08-09 DIAGNOSIS — R0602 Shortness of breath: Secondary | ICD-10-CM | POA: Diagnosis not present

## 2023-08-09 DIAGNOSIS — Z01818 Encounter for other preprocedural examination: Secondary | ICD-10-CM | POA: Diagnosis not present

## 2023-08-09 DIAGNOSIS — I340 Nonrheumatic mitral (valve) insufficiency: Secondary | ICD-10-CM | POA: Diagnosis not present

## 2023-08-10 DIAGNOSIS — D696 Thrombocytopenia, unspecified: Secondary | ICD-10-CM | POA: Diagnosis not present

## 2023-08-10 DIAGNOSIS — Z7982 Long term (current) use of aspirin: Secondary | ICD-10-CM | POA: Diagnosis not present

## 2023-08-10 DIAGNOSIS — Z4682 Encounter for fitting and adjustment of non-vascular catheter: Secondary | ICD-10-CM | POA: Diagnosis not present

## 2023-08-10 DIAGNOSIS — R739 Hyperglycemia, unspecified: Secondary | ICD-10-CM | POA: Diagnosis not present

## 2023-08-10 DIAGNOSIS — Z880 Allergy status to penicillin: Secondary | ICD-10-CM | POA: Diagnosis not present

## 2023-08-10 DIAGNOSIS — J90 Pleural effusion, not elsewhere classified: Secondary | ICD-10-CM | POA: Diagnosis not present

## 2023-08-10 DIAGNOSIS — Z452 Encounter for adjustment and management of vascular access device: Secondary | ICD-10-CM | POA: Diagnosis not present

## 2023-08-10 DIAGNOSIS — T380X5A Adverse effect of glucocorticoids and synthetic analogues, initial encounter: Secondary | ICD-10-CM | POA: Diagnosis not present

## 2023-08-10 DIAGNOSIS — G4733 Obstructive sleep apnea (adult) (pediatric): Secondary | ICD-10-CM | POA: Diagnosis not present

## 2023-08-10 DIAGNOSIS — Z888 Allergy status to other drugs, medicaments and biological substances status: Secondary | ICD-10-CM | POA: Diagnosis not present

## 2023-08-10 DIAGNOSIS — I480 Paroxysmal atrial fibrillation: Secondary | ICD-10-CM | POA: Diagnosis not present

## 2023-08-10 DIAGNOSIS — Z95818 Presence of other cardiac implants and grafts: Secondary | ICD-10-CM | POA: Diagnosis not present

## 2023-08-10 DIAGNOSIS — Z8673 Personal history of transient ischemic attack (TIA), and cerebral infarction without residual deficits: Secondary | ICD-10-CM | POA: Diagnosis not present

## 2023-08-10 DIAGNOSIS — J9811 Atelectasis: Secondary | ICD-10-CM | POA: Diagnosis not present

## 2023-08-10 DIAGNOSIS — Z79899 Other long term (current) drug therapy: Secondary | ICD-10-CM | POA: Diagnosis not present

## 2023-08-10 DIAGNOSIS — F05 Delirium due to known physiological condition: Secondary | ICD-10-CM | POA: Diagnosis not present

## 2023-08-10 DIAGNOSIS — R918 Other nonspecific abnormal finding of lung field: Secondary | ICD-10-CM | POA: Diagnosis not present

## 2023-08-10 DIAGNOSIS — D62 Acute posthemorrhagic anemia: Secondary | ICD-10-CM | POA: Diagnosis not present

## 2023-08-10 DIAGNOSIS — I341 Nonrheumatic mitral (valve) prolapse: Secondary | ICD-10-CM | POA: Diagnosis not present

## 2023-08-10 DIAGNOSIS — E785 Hyperlipidemia, unspecified: Secondary | ICD-10-CM | POA: Diagnosis not present

## 2023-08-10 DIAGNOSIS — N179 Acute kidney failure, unspecified: Secondary | ICD-10-CM | POA: Diagnosis not present

## 2023-08-10 DIAGNOSIS — E876 Hypokalemia: Secondary | ICD-10-CM | POA: Diagnosis not present

## 2023-08-10 DIAGNOSIS — G8918 Other acute postprocedural pain: Secondary | ICD-10-CM | POA: Diagnosis not present

## 2023-08-10 DIAGNOSIS — R1312 Dysphagia, oropharyngeal phase: Secondary | ICD-10-CM | POA: Diagnosis not present

## 2023-08-10 DIAGNOSIS — I340 Nonrheumatic mitral (valve) insufficiency: Secondary | ICD-10-CM | POA: Diagnosis not present

## 2023-08-10 DIAGNOSIS — Z7984 Long term (current) use of oral hypoglycemic drugs: Secondary | ICD-10-CM | POA: Diagnosis not present

## 2023-08-10 DIAGNOSIS — Z9889 Other specified postprocedural states: Secondary | ICD-10-CM | POA: Diagnosis not present

## 2023-08-10 DIAGNOSIS — I7121 Aneurysm of the ascending aorta, without rupture: Secondary | ICD-10-CM | POA: Diagnosis not present

## 2023-08-10 DIAGNOSIS — Z952 Presence of prosthetic heart valve: Secondary | ICD-10-CM | POA: Diagnosis not present

## 2023-08-10 DIAGNOSIS — J95821 Acute postprocedural respiratory failure: Secondary | ICD-10-CM | POA: Diagnosis not present

## 2023-08-10 DIAGNOSIS — Z8679 Personal history of other diseases of the circulatory system: Secondary | ICD-10-CM | POA: Diagnosis not present

## 2023-08-18 ENCOUNTER — Telehealth (HOSPITAL_COMMUNITY): Payer: Self-pay

## 2023-08-18 NOTE — Telephone Encounter (Signed)
 Outside/paper referral received by Dr. Farrel Hones from [facility]. Will fax over Physician order and request further documents. Insurance benefits and eligibility to be determined.   Contacted patient, they are interested in cardiac rehab. Stated their f/u appt with cardiologist will be on 6/17, they are aware we will need to wait for that appt to continue with scheduling.

## 2023-08-19 ENCOUNTER — Encounter: Payer: Self-pay | Admitting: Cardiology

## 2023-08-26 ENCOUNTER — Other Ambulatory Visit: Payer: Self-pay | Admitting: Cardiology

## 2023-08-26 DIAGNOSIS — D649 Anemia, unspecified: Secondary | ICD-10-CM | POA: Diagnosis not present

## 2023-08-26 DIAGNOSIS — R899 Unspecified abnormal finding in specimens from other organs, systems and tissues: Secondary | ICD-10-CM | POA: Diagnosis not present

## 2023-08-26 DIAGNOSIS — Z8679 Personal history of other diseases of the circulatory system: Secondary | ICD-10-CM | POA: Diagnosis not present

## 2023-08-26 DIAGNOSIS — I059 Rheumatic mitral valve disease, unspecified: Secondary | ICD-10-CM | POA: Diagnosis not present

## 2023-08-26 DIAGNOSIS — I480 Paroxysmal atrial fibrillation: Secondary | ICD-10-CM

## 2023-08-26 DIAGNOSIS — Z09 Encounter for follow-up examination after completed treatment for conditions other than malignant neoplasm: Secondary | ICD-10-CM | POA: Diagnosis not present

## 2023-08-26 DIAGNOSIS — Z6824 Body mass index (BMI) 24.0-24.9, adult: Secondary | ICD-10-CM | POA: Diagnosis not present

## 2023-08-26 LAB — LAB REPORT - SCANNED: EGFR: 78

## 2023-08-30 ENCOUNTER — Encounter: Payer: Self-pay | Admitting: Emergency Medicine

## 2023-08-30 ENCOUNTER — Ambulatory Visit: Attending: Emergency Medicine | Admitting: Emergency Medicine

## 2023-08-30 VITALS — BP 108/74 | HR 76 | Ht 72.0 in | Wt 181.2 lb

## 2023-08-30 DIAGNOSIS — I10 Essential (primary) hypertension: Secondary | ICD-10-CM | POA: Diagnosis not present

## 2023-08-30 DIAGNOSIS — I6521 Occlusion and stenosis of right carotid artery: Secondary | ICD-10-CM

## 2023-08-30 DIAGNOSIS — I340 Nonrheumatic mitral (valve) insufficiency: Secondary | ICD-10-CM

## 2023-08-30 DIAGNOSIS — Z95818 Presence of other cardiac implants and grafts: Secondary | ICD-10-CM

## 2023-08-30 DIAGNOSIS — I480 Paroxysmal atrial fibrillation: Secondary | ICD-10-CM

## 2023-08-30 DIAGNOSIS — Z9889 Other specified postprocedural states: Secondary | ICD-10-CM

## 2023-08-30 DIAGNOSIS — I341 Nonrheumatic mitral (valve) prolapse: Secondary | ICD-10-CM | POA: Diagnosis not present

## 2023-08-30 NOTE — Patient Instructions (Signed)
 Medication Instructions:  NO CHANGES   Lab Work: NONE   Testing/Procedures: NONE  Follow-Up: At [facility], you and your health needs are our priority.  As part of our continuing mission to provide you with exceptional heart care, our providers are all part of one team.  This team includes your primary Cardiologist (physician) and Advanced Practice Providers or APPs (Physician Assistants and Nurse Practitioners) who all work together to provide you with the care you need, when you need it.  Your next appointment:   KEEP APPOINTMENT WITH Berry Bristol  Provider:   Knox Perl, MD

## 2023-08-30 NOTE — Progress Notes (Signed)
 Cardiology Office Note:    Date:  08/30/2023  ID:  ANSH FAUBLE, DOB 11-26-1950, MRN [id number] PCP: Jimmey Mould, MD  [facility] Providers Cardiologist:  Knox Perl, MD Electrophysiologist:  Boyce Byes, MD       Patient Profile:       Chief Complaint: Follow-up s/p AVR and maze procedure History of Present Illness:  Terry [name] is a 73 y.o. 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  Patient was last seen in office on 07/06/2023.  For the past few months he noticed worsening dyspnea on exertion and mild orthopnea.  He had noted to have occasional episodes of breakthrough atrial fibrillation.  He had noted some decreased exercise tolerance.  It was suspected that his severe mitral valve regurgitation with suspected valve tear is causing increased dyspnea.  He underwent cardiac catheterization on 07/13/2023 showing normal coronary arteries.  TEE completed on 07/13/2023 showing LVEF 50 to 55%, no RWMA, normal RV function normal, Watchman device well-seated with no device leak or thrombus, severe eccentric anteriorly directed mitral regurgitation with primarily prolapsing P2 segment with torn cord and flow component of P2 as well as probable small flow component of P1 with severe mitral valve regurgitation, borderline dilation of ascending aorta measuring 40 mm.  Carotid ultrasound 07/2023 with right ICA consistent 1-39% stenosis.  He is now s/p mitral valve repair (posterior leaflet P2/P1 resection with leaflet reconstruction; 32mm Seguin ring) and MAZE via sternotomy on 08/10/23 at [facility].  His Tikosyn  was discontinued.  And his metoprolol  was  decreased due to hypotension.  Recently saw PCP.  Had labs drawn and had mild anemia.  Was started on iron supplementation.   Discussed the use of AI scribe software for clinical note transcription with the patient, who gave verbal consent to proceed.  History of Present Illness Terry [name] is a 73 year old male with severe mitral valve regurgitation and atrial fibrillation who presents for follow-up after mitral valve repair and maze procedure.   Today patient arrives to clinic with his wife.  He notes he is doing well.  He is without any acute cardiovascular concerns or complaint at this time.  Today he tells me his shortness of breath has improved slightly.  His leg swelling has entirely resolved.  He denies any exertional chest pains.  Denies any orthopnea, PND.  Notes that he does have soreness to his chest from the incision site.  He continues to walk daily with his wife without limitation.  He is taking Tylenol  500 mg as needed for pain.  He also denies any symptoms concerning for recurrent atrial fibrillation.  Notes prior to maze procedure he was going into atrial fibrillation constantly.  Notes he now denies any of the symptoms.  Currently monitoring heart rhythm on his watch with no recurrent A-fib.   Review of systems:  Please see the history of present illness. All other systems are reviewed and otherwise negative.      Studies Reviewed:    EKG Interpretation Date/Time:  Tuesday August 30 2023 10:59:53 EDT Ventricular Rate:  76 PR Interval:  176 QRS Duration:  82 QT Interval:  378 QTC Calculation: 425 R Axis:   45  Text Interpretation: Normal sinus  rhythm ST & T wave abnormality, consider inferior ischemia When compared with ECG of 05-Jul-2023 08:38, Non-specific change in ST segment in Anterior leads T wave inversion now evident in Inferior leads Confirmed by Palmer Bobo [phone]) on 08/30/2023 12:56:48 PM    Echo TEE 07/13/2023 1. Left ventricular ejection  fraction, by estimation, is 50 to 55%. The  left ventricle has low normal function. The left ventricle has no regional  wall motion abnormalities.   2. Right ventricular systolic function is normal. The right ventricular  size is mildly enlarged.   3. 24 mm Watchman device in place in left atrial appendage. Well seated  with no device [place]. No device related thrombus. Implant date 10/29/21.  Left atrial size was moderately dilated. No left atrial/left atrial  appendage thrombus was detected.   4. 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 Evidence of atrial level shunting detected by color  flow Doppler.   5. Right atrial size was mildly dilated.   6. Severe, eccentric anteriorly directed mitral valve regurgitation with  primarily prolapsing P2 segment with torn chord and flail component of P2,  as well as probable small flail component of P1. Systolic reversals noted  in pulmonary veins. . The  mitral valve is abnormal. Severe mitral valve regurgitation. No evidence  of mitral stenosis.   7. 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 The aortic valve is tricuspid.  Aortic valve regurgitation is trivial. No aortic stenosis is present.   8. There is borderline dilatation of the ascending aorta, measuring 40  mm.   9. 3D performed of the mitral valve and demonstrates mitral valve -  prolapse of P2 with small torn chord.   R/L heart catheterization 07/13/2023 Impression and recommendations: Normal coronary arteries, mildly elevated EDP, preserved cardiac output and cardiac index.  Patient will be evaluated for minimally invasive mitral valve surgery. Diagnostic Dominance: Right  Risk Assessment/Calculations:    CHA2DS2-VASc Score =     This indicates a  % annual risk of stroke. The patient's score is based upon:              Physical Exam:   VS:  BP 108/74 (BP Location: Right Arm, Patient Position: Sitting, Cuff Size: Normal)    Pulse 76   Ht 6' (1.829 m)   Wt 181 lb 3.2 oz (82.2 kg)   SpO2 98%   BMI 24.58 kg/m    Wt Readings from Last 3 Encounters:  08/30/23 181 lb 3.2 oz (82.2 kg)  07/05/23 182 lb 12.8 oz (82.9 kg)  11/29/22 177 lb 3.2 oz (80.4 kg)    GEN: Well nourished, well developed in no acute distress NECK: No JVD; No carotid bruits CARDIAC: RRR, no murmurs, rubs, gallops RESPIRATORY:  Clear to auscultation without rales, wheezing or rhonchi  ABDOMEN: Soft, non-tender, non-distended EXTREMITIES:  No edema; No acute deformity      Assessment and Plan:  Severe mitral valve regurgitation s/p MVR Mitral valve prolapse TEE 07/13/2023 with severe eccentric anteriorly directed mitral valve regurgitation with primarily prolapsing P2 segment with torn cord and flail component of P2, as well as probable small flow component of P1 S/p mitral valve repair (posterior leaflet P2/P1 resection with leaflet reconstruction; 32mm Seguin ring) and MAZE via sternotomy on 08/10/23 at [facility].  - Today patient is doing well overall.  Notes mild improvement in shortness of breath that has been gradual.  Leg swelling has entirely resolved.  No orthopnea or PND.  Weight has  been stable.  Notes mild chest soreness controlled with Tylenol .  No exertional chest pains. - He is now able to walk in his neighborhood without limitation - Has follow-up with cardiothoracic surgery Dr. Farrel Hones at [facility] on 09/22/2023 - Incision site from sternotomy healing appropriately without signs of infection or dehiscence - No changes to current therapy  Paroxysmal atrial fibrillation S/p Watchman FLX 24 mm device on 10/29/2021 S/p MAZE procedure via sternotomy during MVR on 08/10/2023 at [facility].  Tikosyn  was subsequently discontinued after procedure. - EKG today shows patient is maintaining NSR - Patient notes prior to procedure he was constantly going in and out of atrial fibrillation and since procedure he denies any symptoms concerning for recurrent atrial  fibrillation.  Continues to monitor his rhythm on watch - No OAC due to Watchman device (functioning properly per TEE 06/2023) - Continue metoprolol  XL 12.5 mg daily - No changes to current therapy  Hypertension Blood pressure today well-controlled at 108/74 - Continue metoprolol  XL 12.5 daily  Carotid artery disease Carotid US  07/2023 with 1-39% right ICA stenosis and no left ICA stenosis - Remains asymptomatic - Continue aspirin  81 mg daily, rosuvastatin  20 once daily, Zetia  10 mg daily    Cardiac Rehabilitation Eligibility Assessment  The patient is ready to start cardiac rehabilitation pending clearance from the cardiac surgeon. (S/p AVR follows with [facility])     Dispo: Follow-up with Dr. Berry Bristol on 10/11/2023  Signed, Ava Boatman, NP

## 2023-08-31 ENCOUNTER — Encounter: Payer: Self-pay | Admitting: Emergency Medicine

## 2023-08-31 DIAGNOSIS — T7840XA Allergy, unspecified, initial encounter: Secondary | ICD-10-CM | POA: Diagnosis not present

## 2023-09-12 DIAGNOSIS — H34832 Tributary (branch) retinal vein occlusion, left eye, with macular edema: Secondary | ICD-10-CM | POA: Diagnosis not present

## 2023-09-12 DIAGNOSIS — H35033 Hypertensive retinopathy, bilateral: Secondary | ICD-10-CM | POA: Diagnosis not present

## 2023-09-12 DIAGNOSIS — H35373 Puckering of macula, bilateral: Secondary | ICD-10-CM | POA: Diagnosis not present

## 2023-09-12 DIAGNOSIS — H43822 Vitreomacular adhesion, left eye: Secondary | ICD-10-CM | POA: Diagnosis not present

## 2023-09-12 DIAGNOSIS — H43811 Vitreous degeneration, right eye: Secondary | ICD-10-CM | POA: Diagnosis not present

## 2023-09-12 DIAGNOSIS — H348112 Central retinal vein occlusion, right eye, stable: Secondary | ICD-10-CM | POA: Diagnosis not present

## 2023-09-12 DIAGNOSIS — H2513 Age-related nuclear cataract, bilateral: Secondary | ICD-10-CM | POA: Diagnosis not present

## 2023-09-22 ENCOUNTER — Encounter: Payer: Self-pay | Admitting: Cardiology

## 2023-09-22 DIAGNOSIS — Z9889 Other specified postprocedural states: Secondary | ICD-10-CM | POA: Diagnosis not present

## 2023-09-22 DIAGNOSIS — Z48812 Encounter for surgical aftercare following surgery on the circulatory system: Secondary | ICD-10-CM | POA: Diagnosis not present

## 2023-09-22 DIAGNOSIS — J90 Pleural effusion, not elsewhere classified: Secondary | ICD-10-CM | POA: Diagnosis not present

## 2023-09-22 DIAGNOSIS — R918 Other nonspecific abnormal finding of lung field: Secondary | ICD-10-CM | POA: Diagnosis not present

## 2023-09-22 DIAGNOSIS — Z79899 Other long term (current) drug therapy: Secondary | ICD-10-CM | POA: Diagnosis not present

## 2023-09-22 DIAGNOSIS — Z8679 Personal history of other diseases of the circulatory system: Secondary | ICD-10-CM | POA: Diagnosis not present

## 2023-09-22 DIAGNOSIS — Z952 Presence of prosthetic heart valve: Secondary | ICD-10-CM | POA: Diagnosis not present

## 2023-09-22 DIAGNOSIS — Q2546 Tortuous aortic arch: Secondary | ICD-10-CM | POA: Diagnosis not present

## 2023-09-23 ENCOUNTER — Telehealth (HOSPITAL_COMMUNITY): Payer: Self-pay

## 2023-09-23 ENCOUNTER — Other Ambulatory Visit: Payer: Self-pay | Admitting: Cardiology

## 2023-09-23 DIAGNOSIS — Z9889 Other specified postprocedural states: Secondary | ICD-10-CM | POA: Insufficient documentation

## 2023-09-23 DIAGNOSIS — I480 Paroxysmal atrial fibrillation: Secondary | ICD-10-CM

## 2023-09-23 NOTE — Telephone Encounter (Signed)
 ICD-10-CM   1. S/P MVR (mitral valve repair) 08/10/23 with 32mm Seguin ring) and MAZE Procedure for AF  Z98.890 AMB referral to cardiac rehabilitation     Orders Placed This Encounter  Procedures   AMB referral to cardiac rehabilitation    Referral Priority:   Routine    Referral Type:   Consultation    Number of Visits Requested:   1    No orders of the defined types were placed in this encounter.

## 2023-09-23 NOTE — Telephone Encounter (Signed)
 Pt insurance is active and benefits verified through [employer]. Co-pay $15, DED $0/$0 met, out of pocket $3,400/$492.79 met, co-insurance 0%. No pre-authorization required. 09/23/2023 @ 1:06pm, spoke with Nitish, REF# [id number].  How many CR sessions are covered? (36 visits for TCR, 72 visits for ICR)72 ICR Is this a lifetime maximum or an annual maximum? Annual Has the member used any of these services to date? No Is there a time limit (weeks/months) on start of program and/or program completion? No

## 2023-09-29 ENCOUNTER — Encounter (HOSPITAL_COMMUNITY): Payer: Self-pay

## 2023-10-03 DIAGNOSIS — C4441 Basal cell carcinoma of skin of scalp and neck: Secondary | ICD-10-CM | POA: Diagnosis not present

## 2023-10-10 ENCOUNTER — Other Ambulatory Visit: Payer: Self-pay | Admitting: Cardiology

## 2023-10-10 DIAGNOSIS — E7800 Pure hypercholesterolemia, unspecified: Secondary | ICD-10-CM

## 2023-10-11 ENCOUNTER — Ambulatory Visit: Attending: Cardiology | Admitting: Cardiology

## 2023-10-11 ENCOUNTER — Encounter: Payer: Self-pay | Admitting: Cardiology

## 2023-10-11 VITALS — BP 130/82 | HR 66 | Resp 16 | Ht 72.0 in | Wt 176.0 lb

## 2023-10-11 DIAGNOSIS — Z95818 Presence of other cardiac implants and grafts: Secondary | ICD-10-CM

## 2023-10-11 DIAGNOSIS — Z2989 Encounter for other specified prophylactic measures: Secondary | ICD-10-CM | POA: Diagnosis not present

## 2023-10-11 DIAGNOSIS — I480 Paroxysmal atrial fibrillation: Secondary | ICD-10-CM | POA: Diagnosis not present

## 2023-10-11 DIAGNOSIS — Z9889 Other specified postprocedural states: Secondary | ICD-10-CM | POA: Diagnosis not present

## 2023-10-11 MED ORDER — AMOXICILLIN 500 MG PO CAPS: 2000.0000 mg | ORAL_CAPSULE | Freq: Once | ORAL | 3 refills | Status: AC

## 2023-10-11 NOTE — Progress Notes (Signed)
 Cardiology Office Note:  .   Date:  10/11/2023  ID:  ZYSHAWN BOHNENKAMP, DOB [birth year], MRN [id number] PCP: Okey Carlin Redbird, MD  [facility] Providers Cardiologist:  [name], MD Electrophysiologist:  OLE ONEIDA HOLTS, MD   History of Present Illness: .   Terry [name] is a 73 y.o. 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  His Tikosyn  was discontinued.  And his metoprolol  succinate was decreased from 25 mg daily to 12.5 mg in the evening due to hypotension.  Patient and his wife are super excited to see me today and states that he is doing very well and he has not had any further episodes of shortness of breath, leg edema is completely resolved, he feels more energetic.  Patient states that he did not know how it feels to feel normal Labs   Lab Results  Component Value Date   CHOL 144 08/08/2021   HDL 33 (L) 08/08/2021   LDLCALC 87 08/08/2021   TRIG 121 08/08/2021   CHOLHDL 4.4 08/08/2021   No results found for: LIPOA  Lab Results  Component Value Date   NA 142 07/13/2023   K 3.7 07/13/2023   CO2 24 07/05/2023   GLUCOSE 102 (H) 07/05/2023   BUN 16 07/05/2023   CREATININE 1.14 07/05/2023   CALCIUM  9.1 07/05/2023   GFR 74.57 02/22/2011   EGFR 78.0 08/26/2023   GFRNONAA >60 10/30/2021      Latest Ref Rng & Units 07/13/2023   12:52 PM 07/13/2023   12:51 PM 07/13/2023   12:49 PM  BMP  Sodium 135 - 145 mmol/L 142  141  141   Potassium 3.5 - 5.1 mmol/L 3.7  3.7  3.7       Latest Ref Rng & Units 07/13/2023   12:52 PM 07/13/2023    12:51 PM 07/13/2023   12:49 PM  CBC  Hemoglobin 13.0 - 17.0 g/dL 85.6  85.6  85.3   Hematocrit 39.0 - 52.0 % 42.0  42.0  43.0    Lab Results  Component Value Date   HGBA1C 5.5 08/07/2021    Lab Results  Component Value Date   TSH 3.390 05/31/2017     ROS  Review of Systems  Cardiovascular:  Negative for chest pain, dyspnea on exertion and leg swelling.   Physical Exam:   VS:  BP 130/82 (BP Location: Left Arm, Patient Position: Sitting, Cuff Size: Normal)   Pulse 66   Resp 16   Ht 6' (1.829 m)   Wt 176 lb (79.8 kg)   SpO2 99%   BMI 23.87 kg/m    Wt Readings from Last 3 Encounters:  10/11/23 176 lb (79.8 kg)  08/30/23 181 lb 3.2 oz (82.2 kg)  07/05/23 182 lb 12.8 oz (82.9 kg)    Physical Exam Neck:     Vascular: No carotid bruit or JVD.  Cardiovascular:     Rate and Rhythm: Normal rate and regular rhythm.     Pulses: Intact distal pulses.     Heart sounds: Normal heart sounds. No murmur heard.    No gallop.  Pulmonary:     Effort: Pulmonary  effort is normal.     Breath sounds: Normal breath sounds.  Abdominal:     General: Bowel sounds are normal.     Palpations: Abdomen is soft.  Musculoskeletal:     Right lower leg: No edema.     Left lower leg: No edema.    Studies Reviewed: SABRA     EKG:    EKG Interpretation Date/Time:  Tuesday October 11 2023 11:25:18 EDT Ventricular Rate:  66 PR Interval:  174 QRS Duration:  84 QT Interval:  404 QTC Calculation: 423 R Axis:   32  Text Interpretation: EKG 10/11/2023: Normal sinus rhythm at rate of 66 bpm, normal axis.  T wave abnormality, cannot exclude inferior ischemia.  Compared to 08/30/2023, no significant change.  Lateral T wave inversion less prominent. Confirmed by Lakyia Behe, Jagadeesh ([id number]) on 10/11/2023 11:39:28 AM    Medications ordered    Meds ordered this encounter  Medications   amoxicillin  (AMOXIL ) 500 MG capsule    Sig: Take 4 capsules (2,000 mg total) by mouth once for 1 dose.    Dispense:  4  capsule    Refill:  3     ASSESSMENT AND PLAN: .      ICD-10-CM   1. S/P MVR (mitral valve repair) 08/10/23 with 32mm Seguin ring) and MAZE Procedure for AF  Z98.890 EKG 12-Lead    ECHOCARDIOGRAM COMPLETE    2. Indication present for endocarditis prophylaxis  Z29.89     3. Presence of Watchman FLX 24mm device 10/29/2021  Z95.818 ECHOCARDIOGRAM COMPLETE    4. Paroxysmal atrial fibrillation (HCC)  I48.0 ECHOCARDIOGRAM COMPLETE      1. S/P MVR (mitral valve repair) 08/10/23 with 32mm Seguin ring) and MAZE Procedure for AF (Primary) No murmur is heard, patient has recuperated very well, surgical scar has completely healed.  He will resume cardiac rehab.  He will need echocardiogram to establish baseline. - EKG 12-Lead - ECHOCARDIOGRAM COMPLETE; Future  2. Indication present for endocarditis prophylaxis Patient made aware that he needs endocarditis prophylaxis and amoxicillin  capsule Rx was sent.  3. Presence of Watchman FLX 24mm device 10/29/2021 Patient has anticoagulation due to spontaneous life-threatening bleed on anticoagulation hence - ECHOCARDIOGRAM COMPLETE; Future  4. Paroxysmal atrial fibrillation (HCC) He is maintaining sinus rhythm, during mitral valve repair on 08/10/2023, he also underwent maze procedure and patient has maintained sinus rhythm and has not had any further palpitations.  He is now off of Tikosyn .  If he has recurrence, we could consider low-dose amiodarone. - ECHOCARDIOGRAM COMPLETE; Future  Office visit in 6 months.  Signed,  [name], MD, [facility] 10/11/2023, 5:54 PM [facility] [address] [place] [employer] [place], [place] [address] Phone: [phone]. Fax:  [phone]

## 2023-10-11 NOTE — Patient Instructions (Signed)
 Medication Instruction Your physician recommends that you continue on your current medications as directed. Please refer to the Current Medication list given to you today.  *If you need a refill on your cardiac medications before your next appointment, please call your pharmacy*  Lab Work: none If you have labs (blood work) drawn today and your tests are completely normal, you will receive your results only by: MyChart Message (if you have MyChart) OR A paper copy in the mail If you have any lab test that is abnormal or we need to change your treatment, we will call you to review the results.  Testing/Procedures: Your physician has requested that you have an echocardiogram. Echocardiography is a painless test that uses sound waves to create images of your heart. It provides your doctor with information about the size and shape of your heart and how well your heart's chambers and valves are working. This procedure takes approximately one hour. There are no restrictions for this procedure. Please do NOT wear cologne, perfume, aftershave, or lotions (deodorant is allowed). Please arrive 15 minutes prior to your appointment time.  Please note: We ask at that you not bring children with you during ultrasound (echo/ vascular) testing. Due to room size and safety concerns, children are not allowed in the ultrasound rooms during exams. Our front office staff cannot provide observation of children in our lobby area while testing is being conducted. An adult accompanying a patient to their appointment will only be allowed in the ultrasound room at the discretion of the ultrasound technician under special circumstances. We apologize for any inconvenience.   Follow-Up: At [facility], you and your health needs are our priority.  As part of our continuing mission to provide you with exceptional heart care, our providers are all part of one team.  This team includes your primary Cardiologist  (physician) and Advanced Practice Providers or APPs (Physician Assistants and Nurse Practitioners) who all work together to provide you with the care you need, when you need it.  Your next appointment:   6 month(s)  Provider:   [name], MD    We recommend signing up for the patient portal called MyChart.  Sign up information is provided on this After Visit Summary.  MyChart is used to connect with patients for Virtual Visits (Telemedicine).  Patients are able to view lab/test results, encounter notes, upcoming appointments, etc.  Non-urgent messages can be sent to your provider as well.   To learn more about what you can do with MyChart, go to [email or web address].   Other Instructions

## 2023-10-13 ENCOUNTER — Encounter (HOSPITAL_COMMUNITY)
Admission: RE | Admit: 2023-10-13 | Discharge: 2023-10-13 | Disposition: A | Discharge status: Home / Self Care | Source: Ambulatory Visit | Attending: Cardiology | Admitting: Cardiology

## 2023-10-13 VITALS — BP 118/80 | HR 71 | Ht 72.75 in | Wt 179.7 lb

## 2023-10-13 DIAGNOSIS — Z9889 Other specified postprocedural states: Secondary | ICD-10-CM | POA: Diagnosis not present

## 2023-10-13 NOTE — Progress Notes (Signed)
 Cardiac Individual Treatment Plan  Patient Details  Name: Terry [name] MRN: [id number] Date of Birth: [birth year] Referring Provider:   Flowsheet Row INTENSIVE CARDIAC REHAB ORIENT from 10/13/2023 in [facility]  Referring Provider [name], MD    Initial Encounter Date:  Flowsheet Row INTENSIVE CARDIAC REHAB ORIENT from 10/13/2023 in [facility], [facility] Lung [facility]  Date 10/13/23    Visit Diagnosis: S/P mitral valve repair  Patient's Home Medications on Admission:  Current Outpatient Medications:    amoxicillin  (AMOXIL ) 500 MG capsule, Take 4 capsules (2,000 mg total) by mouth once for 1 dose. (Patient taking differently: Take 2,000 mg by mouth once. Pt has it in case of dental procedure), Disp: 4 capsule, Rfl: 3   aspirin  EC 81 MG tablet, Take 81 mg by mouth daily. Swallow whole., Disp: , Rfl:    Cholecalciferol (VITAMIN D3 ULTRA STRENGTH) 125 MCG (5000 UT) capsule, Take 5,000 Units by mouth at bedtime., Disp: , Rfl:    ezetimibe  (ZETIA ) 10 MG tablet, TAKE 1 TABLET BY MOUTH EVERY DAY, Disp: 90 tablet, Rfl: 2   fluticasone  (FLONASE ) 50 MCG/ACT nasal spray, Place 2 sprays into both nostrils every evening. , Disp: , Rfl:    loratadine  (CLARITIN ) 10 MG tablet, Take 10 mg by mouth daily., Disp: , Rfl:    magnesium  oxide (MAG-OX) 400 (240 Mg) MG tablet, TAKE 1 TABLET BY MOUTH EVERY DAY. *OTC/NOT COVD*, Disp: 90 tablet, Rfl: 3   metoprolol  succinate (TOPROL -XL) 25 MG 24 hr tablet, Take 12.5 mg by mouth daily., Disp: , Rfl:    Probiotic Product (PROBIOTIC DAILY PO), Take 1 capsule by mouth in the morning. TruNature Advanced Digestive Probiotic, Disp: , Rfl:    rosuvastatin  (CRESTOR ) 20 MG tablet, Take 20 mg by mouth daily., Disp: , Rfl:    senna-docusate (SENOKOT-S) 8.6-50 MG tablet, Take 2 tablets by mouth as needed for mild constipation or moderate constipation., Disp: , Rfl:    zinc  gluconate 50 MG tablet, Take 50 mg by mouth at bedtime., Disp: , Rfl:    vitamin B-12 (CYANOCOBALAMIN ) 100 MCG tablet, Take 100 mcg by mouth daily. (Patient not taking: Reported on 10/13/2023), Disp: , Rfl:   Past Medical History: Past Medical History:  Diagnosis Date   Aortic root dilation (HCC)    a. mild by CT 10/2017.   Basal cell carcinoma 10/02/2019   left hand bcc nod.   Cardiomyopathy [facility])    Central retinal vein occlusion    Chronic systolic CHF (congestive heart failure) (HCC)    ED (erectile dysfunction)    H/O cardiac catheterization 2002 - normal cors   HTN (hypertension)    Hyperlipidemia    Hypokalemia    Leg edema    LV dysfunction    a. EF appeared mildly depressed by TEE 08/2015, no % given.   Mini stroke    a. ? Age 6 at time of central retinal vein occlusion - lost vision in eye   PAF (paroxysmal atrial fibrillation) (HCC)    a. Dx 06/2015, s/p TEE/DCCV 08/2015.   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 Cindie   Syncope    Hx of thought to be due to orthostatic hypotension   Visit for monitoring Tikosyn  therapy 01/17/2018    Tobacco Use: Social History   Tobacco Use  Smoking Status Never  Smokeless  Tobacco Never    Labs: Review Flowsheet  More data exists      Latest Ref Rng & Units 08/04/2018 10/08/2019 08/07/2021 08/08/2021 07/13/2023  Labs for ITP Cardiac and Pulmonary Rehab  Cholestrol 0 - 200 mg/dL 842  844  - 855  -  LDL (calc) 0 - 99 mg/dL 94  95  - 87  -  HDL-C >40 mg/dL 42  36  - 33  -  Trlycerides <150 mg/dL 892  866  - 878  -  Hemoglobin A1c 4.8 - 5.6 % - - 5.5  - -  PH, Arterial 7.35 - 7.45 - - - - 7.388   PCO2 arterial 32 - 48 mmHg - - - - 38.8   Bicarbonate 20.0 - 28.0 mmol/L - - - - 25.0  25.5  23.4   TCO2 22 - 32 mmol/L - - 26  - 26  27  25    Acid-base deficit 0.0 - 2.0 mmol/L - - - - 1.0  1.0   O2 Saturation % - - - - 72  71  94     Details        Multiple values from one day are sorted in reverse-chronological order         Capillary Blood Glucose: Lab Results  Component Value Date   GLUCAP 98 08/08/2021   GLUCAP 75 08/07/2021   GLUCAP 150 (H) 08/19/2020     Exercise Target Goals: Exercise Program Goal: Individual exercise prescription set using results from initial 6 min walk test and THRR while considering  patient's activity barriers and safety.   Exercise Prescription Goal: Initial exercise prescription builds to 30-45 minutes a day of aerobic activity, 2-3 days per week.  Home exercise guidelines will be given to patient during program as part of exercise prescription that the participant will acknowledge.  Activity Barriers & Risk Stratification:  Activity Barriers & Cardiac Risk Stratification - 10/13/23 1136       Activity Barriers & Cardiac Risk Stratification   Activity Barriers Balance Concerns    Cardiac Risk Stratification High          6 Minute Walk:  6 Minute Walk     Row Name 10/13/23 1304         6 Minute Walk   Phase Initial     Distance 1650 feet     Walk Time 6 minutes     # of Rest Breaks 0     MPH 3.1     METS 4.1     RPE 9     Perceived Dyspnea  0     VO2 Peak 14.4     Symptoms No     Resting HR 71 bpm     Resting BP 118/80     Resting Oxygen Saturation  98 %     Exercise Oxygen Saturation  during 6 min walk 97 %     Max Ex. HR 90 bpm     Max Ex. BP 150/70     2 Minute Post BP 128/72        Oxygen Initial Assessment:   Oxygen Re-Evaluation:   Oxygen Discharge (Final Oxygen Re-Evaluation):   Initial Exercise Prescription:  Initial Exercise Prescription - 10/13/23 1100       Date of Initial Exercise RX and Referring Provider   Date 10/13/23    Referring Provider [name], MD    Expected Discharge Date 01/04/24      Bike   Level 2  Watts 54    Minutes 15    METs 4.1      Recumbant Elliptical   Level 2    RPM 60    Watts 75    Minutes 15    METs  4.1      Prescription Details   Frequency (times per week) 3    Duration Progress to 30 minutes of continuous aerobic without signs/symptoms of physical distress      Intensity   THRR 40-80% of Max Heartrate 60-120    Ratings of Perceived Exertion 11-13    Perceived Dyspnea 0-4      Progression   Progression Continue progressive overload as per policy without signs/symptoms or physical distress.      Resistance Training   Training Prescription Yes    Weight 5 lbs    Reps 10-15          Perform Capillary Blood Glucose checks as needed.  Exercise Prescription Changes:   Exercise Comments:   Exercise Goals and Review:   Exercise Goals     Row Name 10/13/23 1134             Exercise Goals   Increase Physical Activity Yes       Intervention Provide advice, education, support and counseling about physical activity/exercise needs.;Develop an individualized exercise prescription for aerobic and resistive training based on initial evaluation findings, risk stratification, comorbidities and participant's personal goals.       Expected Outcomes Short Term: Attend rehab on a regular basis to increase amount of physical activity.;Long Term: Add in home exercise to make exercise part of routine and to increase amount of physical activity.;Long Term: Exercising regularly at least 3-5 days a week.       Increase Strength and Stamina Yes       Intervention Provide advice, education, support and counseling about physical activity/exercise needs.;Develop an individualized exercise prescription for aerobic and resistive training based on initial evaluation findings, risk stratification, comorbidities and participant's personal goals.       Expected Outcomes Short Term: Increase workloads from initial exercise prescription for resistance, speed, and METs.;Short Term: Perform resistance training exercises routinely during rehab and add in resistance training at home;Long Term: Improve  cardiorespiratory fitness, muscular endurance and strength as measured by increased METs and functional capacity ( )       Able to understand and use rate of perceived exertion (RPE) scale Yes       Intervention Provide education and explanation on how to use RPE scale       Expected Outcomes Short Term: Able to use RPE daily in rehab to express subjective intensity level;Long Term:  Able to use RPE to guide intensity level when exercising independently       Knowledge and understanding of Target Heart Rate Range (THRR) Yes       Intervention Provide education and explanation of THRR including how the numbers were predicted and where they are located for reference       Expected Outcomes Short Term: Able to state/look up THRR;Short Term: Able to use daily as guideline for intensity in rehab;Long Term: Able to use THRR to govern intensity when exercising independently       Understanding of Exercise Prescription Yes       Intervention Provide education, explanation, and written materials on patient's individual exercise prescription       Expected Outcomes Short Term: Able to explain program exercise prescription;Long Term: Able to explain home exercise prescription to exercise  independently          Exercise Goals Re-Evaluation :   Discharge Exercise Prescription (Final Exercise Prescription Changes):   Nutrition:  Target Goals: Understanding of nutrition guidelines, daily intake of sodium 1500mg , cholesterol 200mg , calories 30% from fat and 7% or less from saturated fats, daily to have 5 or more servings of fruits and vegetables.  Biometrics:  Pre Biometrics - 10/13/23 1120       Pre Biometrics   Waist Circumference 38.75 inches    Hip Circumference 39 inches    Waist to Hip Ratio 0.99 %    Triceps Skinfold 9 mm    % Body Fat 23.2 %    Grip Strength 27 kg    Flexibility 0 in   unable to reach   Single Leg Stand 10.93 seconds           Nutrition Therapy Plan and  Nutrition Goals:   Nutrition Assessments:  MEDIFICTS Score Key: >=70 Need to make dietary changes  40-70 Heart Healthy Diet <= 40 Therapeutic Level Cholesterol Diet    Picture Your Plate Scores: <59 Unhealthy dietary pattern with much room for improvement. 41-50 Dietary pattern unlikely to meet recommendations for good health and room for improvement. 51-60 More healthful dietary pattern, with some room for improvement.  >60 Healthy dietary pattern, although there may be some specific behaviors that could be improved.    Nutrition Goals Re-Evaluation:   Nutrition Goals Re-Evaluation:   Nutrition Goals Discharge (Final Nutrition Goals Re-Evaluation):   Psychosocial: Target Goals: Acknowledge presence or absence of significant depression and/or stress, maximize coping skills, provide positive support system. Participant is able to verbalize types and ability to use techniques and skills needed for reducing stress and depression.  Initial Review & Psychosocial Screening:  Initial Psych Review & Screening - 10/13/23 1137       Initial Review   Current issues with None Identified      Family Dynamics   Good Support System? Yes   Pt has spouse and daughters for support     Barriers   Psychosocial barriers to participate in program There are no identifiable barriers or psychosocial needs.      Screening Interventions   Interventions Encouraged to exercise          Quality of Life Scores:  Quality of Life - 10/13/23 1354       Quality of Life   Select Quality of Life      Quality of Life Scores   Health/Function Pre 30 %    Socioeconomic Pre 30 %    Psych/Spiritual Pre 30 %    Family Pre 30 %    GLOBAL Pre 30 %         Scores of 19 and below usually indicate a poorer quality of life in these areas.  A difference of  2-3 points is a clinically meaningful difference.  A difference of 2-3 points in the total score of the Quality of Life Index has been  associated with significant improvement in overall quality of life, self-image, physical symptoms, and general health in studies assessing change in quality of life.  PHQ-9: Review Flowsheet       10/13/2023  Depression screen PHQ 2/9  Decreased Interest 0  Down, Depressed, Hopeless 0  PHQ - 2 Score 0  Altered sleeping 0  Tired, decreased energy 0  Change in appetite 0  Feeling bad or failure about yourself  0  Trouble concentrating 0  Moving slowly  or fidgety/restless 0  Suicidal thoughts 0  PHQ-9 Score 0  Difficult doing work/chores Not difficult at all   Interpretation of Total Score  Total Score Depression Severity:  1-4 = Minimal depression, 5-9 = Mild depression, 10-14 = Moderate depression, 15-19 = Moderately severe depression, 20-27 = Severe depression   Psychosocial Evaluation and Intervention:   Psychosocial Re-Evaluation:   Psychosocial Discharge (Final Psychosocial Re-Evaluation):   Vocational Rehabilitation: Provide vocational rehab assistance to qualifying candidates.   Vocational Rehab Evaluation & Intervention:  Vocational Rehab - 10/13/23 1139       Initial Vocational Rehab Evaluation & Intervention   Assessment shows need for Vocational Rehabilitation No   Pt is retired         Education: Education Goals: Education classes will be provided on a weekly basis, covering required topics. Participant will state understanding/return demonstration of topics presented.     Core Videos: Exercise    Move It!  Clinical staff conducted group or individual video education with verbal and written material and guidebook.  Patient learns the recommended Pritikin exercise program. Exercise with the goal of living a long, healthy life. Some of the health benefits of exercise include controlled diabetes, healthier blood pressure levels, improved cholesterol levels, improved heart and lung capacity, improved sleep, and better body composition. Everyone should  speak with their doctor before starting or changing an exercise routine.  Biomechanical Limitations Clinical staff conducted group or individual video education with verbal and written material and guidebook.  Patient learns how biomechanical limitations can impact exercise and how we can mitigate and possibly overcome limitations to have an impactful and balanced exercise routine.  Body Composition Clinical staff conducted group or individual video education with verbal and written material and guidebook.  Patient learns that body composition (ratio of muscle mass to fat mass) is a key component to assessing overall fitness, rather than body weight alone. Increased fat mass, especially visceral belly fat, can put us  at increased risk for metabolic syndrome, type 2 diabetes, heart disease, and even death. It is recommended to combine diet and exercise (cardiovascular and resistance training) to improve your body composition. Seek guidance from your physician and exercise physiologist before implementing an exercise routine.  Exercise Action Plan Clinical staff conducted group or individual video education with verbal and written material and guidebook.  Patient learns the recommended strategies to achieve and enjoy long-term exercise adherence, including variety, self-motivation, self-efficacy, and positive decision making. Benefits of exercise include fitness, good health, weight management, more energy, better sleep, less stress, and overall well-being.  Medical   Heart Disease Risk Reduction Clinical staff conducted group or individual video education with verbal and written material and guidebook.  Patient learns our heart is our most vital organ as it circulates oxygen, nutrients, white blood cells, and hormones throughout the entire body, and carries waste away. Data supports a plant-based eating plan like the Pritikin Program for its effectiveness in slowing progression of and reversing heart  disease. The video provides a number of recommendations to address heart disease.   Metabolic Syndrome and Belly Fat  Clinical staff conducted group or individual video education with verbal and written material and guidebook.  Patient learns what metabolic syndrome is, how it leads to heart disease, and how one can reverse it and keep it from coming back. You have metabolic syndrome if you have 3 of the following 5 criteria: abdominal obesity, high blood pressure, high triglycerides, low HDL cholesterol, and high blood sugar.  Hypertension and  Heart Disease Clinical staff conducted group or individual video education with verbal and written material and guidebook.  Patient learns that high blood pressure, or hypertension, is very common in the [place] . Hypertension is largely due to excessive salt intake, but other important risk factors include being overweight, physical inactivity, drinking too much alcohol, smoking, and not eating enough potassium from fruits and vegetables. High blood pressure is a leading risk factor for heart attack, stroke, congestive heart failure, dementia, kidney failure, and premature death. Long-term effects of excessive salt intake include stiffening of the arteries and thickening of heart muscle and organ damage. Recommendations include ways to reduce hypertension and the risk of heart disease.  Diseases of Our Time - Focusing on Diabetes Clinical staff conducted group or individual video education with verbal and written material and guidebook.  Patient learns why the best way to stop diseases of our time is prevention, through food and other lifestyle changes. Medicine (such as prescription pills and surgeries) is often only a Band-Aid on the problem, not a long-term solution. Most common diseases of our time include obesity, type 2 diabetes, hypertension, heart disease, and cancer. The Pritikin Program is recommended and has been proven to help reduce, reverse,  and/or prevent the damaging effects of metabolic syndrome.  Nutrition   Overview of the Pritikin Eating Plan  Clinical staff conducted group or individual video education with verbal and written material and guidebook.  Patient learns about the Pritikin Eating Plan for disease risk reduction. The Pritikin Eating Plan emphasizes a wide variety of unrefined, minimally-processed carbohydrates, like fruits, vegetables, whole grains, and legumes. Go, Caution, and Stop food choices are explained. Plant-based and lean animal proteins are emphasized. Rationale provided for low sodium intake for blood pressure control, low added sugars for blood sugar stabilization, and low added fats and oils for coronary artery disease risk reduction and weight management.  Calorie Density  Clinical staff conducted group or individual video education with verbal and written material and guidebook.  Patient learns about calorie density and how it impacts the Pritikin Eating Plan. Knowing the characteristics of the food you choose will help you decide whether those foods will lead to weight gain or weight loss, and whether you want to consume more or less of them. Weight loss is usually a side effect of the Pritikin Eating Plan because of its focus on low calorie-dense foods.  Label Reading  Clinical staff conducted group or individual video education with verbal and written material and guidebook.  Patient learns about the Pritikin recommended label reading guidelines and corresponding recommendations regarding calorie density, added sugars, sodium content, and whole grains.  Dining Out - Part 1  Clinical staff conducted group or individual video education with verbal and written material and guidebook.  Patient learns that restaurant meals can be sabotaging because they can be so high in calories, fat, sodium, and/or sugar. Patient learns recommended strategies on how to positively address this and avoid unhealthy  pitfalls.  Facts on Fats  Clinical staff conducted group or individual video education with verbal and written material and guidebook.  Patient learns that lifestyle modifications can be just as effective, if not more so, as many medications for lowering your risk of heart disease. A Pritikin lifestyle can help to reduce your risk of inflammation and atherosclerosis (cholesterol build-up, or plaque, in the artery walls). Lifestyle interventions such as dietary choices and physical activity address the cause of atherosclerosis. A review of the types of fats and their impact  on blood cholesterol levels, along with dietary recommendations to reduce fat intake is also included.  Nutrition Action Plan  Clinical staff conducted group or individual video education with verbal and written material and guidebook.  Patient learns how to incorporate Pritikin recommendations into their lifestyle. Recommendations include planning and keeping personal health goals in mind as an important part of their success.  Healthy Mind-Set    Healthy Minds, Bodies, Hearts  Clinical staff conducted group or individual video education with verbal and written material and guidebook.  Patient learns how to identify when they are stressed. Video will discuss the impact of that stress, as well as the many benefits of stress management. Patient will also be introduced to stress management techniques. The way we think, act, and feel has an impact on our hearts.  How Our Thoughts Can Heal Our Hearts  Clinical staff conducted group or individual video education with verbal and written material and guidebook.  Patient learns that negative thoughts can cause depression and anxiety. This can result in negative lifestyle behavior and serious health problems. Cognitive behavioral therapy is an effective method to help control our thoughts in order to change and improve our emotional outlook.  Additional Videos:  Exercise    Improving  Performance  Clinical staff conducted group or individual video education with verbal and written material and guidebook.  Patient learns to use a non-linear approach by alternating intensity levels and lengths of time spent exercising to help burn more calories and lose more body fat. Cardiovascular exercise helps improve heart health, metabolism, hormonal balance, blood sugar control, and recovery from fatigue. Resistance training improves strength, endurance, balance, coordination, reaction time, metabolism, and muscle mass. Flexibility exercise improves circulation, posture, and balance. Seek guidance from your physician and exercise physiologist before implementing an exercise routine and learn your capabilities and proper form for all exercise.  Introduction to Yoga  Clinical staff conducted group or individual video education with verbal and written material and guidebook.  Patient learns about yoga, a discipline of the coming together of mind, breath, and body. The benefits of yoga include improved flexibility, improved range of motion, better posture and core strength, increased lung function, weight loss, and positive self-image. Yoga's heart health benefits include lowered blood pressure, healthier heart rate, decreased cholesterol and triglyceride levels, improved immune function, and reduced stress. Seek guidance from your physician and exercise physiologist before implementing an exercise routine and learn your capabilities and proper form for all exercise.  Medical   Aging: Enhancing Your Quality of Life  Clinical staff conducted group or individual video education with verbal and written material and guidebook.  Patient learns key strategies and recommendations to stay in good physical health and enhance quality of life, such as prevention strategies, having an advocate, securing a Health Care Proxy and Power of Attorney, and keeping a list of medications and system for tracking them. It  also discusses how to avoid risk for bone loss.  Biology of Weight Control  Clinical staff conducted group or individual video education with verbal and written material and guidebook.  Patient learns that weight gain occurs because we consume more calories than we burn (eating more, moving less). Even if your body weight is normal, you may have higher ratios of fat compared to muscle mass. Too much body fat puts you at increased risk for cardiovascular disease, heart attack, stroke, type 2 diabetes, and obesity-related cancers. In addition to exercise, following the Pritikin Eating Plan can help reduce your risk.  Decoding Lab Results  Clinical staff conducted group or individual video education with verbal and written material and guidebook.  Patient learns that lab test reflects one measurement whose values change over time and are influenced by many factors, including medication, stress, sleep, exercise, food, hydration, pre-existing medical conditions, and more. It is recommended to use the knowledge from this video to become more involved with your lab results and evaluate your numbers to speak with your doctor.   Diseases of Our Time - Overview  Clinical staff conducted group or individual video education with verbal and written material and guidebook.  Patient learns that according to the CDC, 50% to 70% of chronic diseases (such as obesity, type 2 diabetes, elevated lipids, hypertension, and heart disease) are avoidable through lifestyle improvements including healthier food choices, listening to satiety cues, and increased physical activity.  Sleep Disorders Clinical staff conducted group or individual video education with verbal and written material and guidebook.  Patient learns how good quality and duration of sleep are important to overall health and well-being. Patient also learns about sleep disorders and how they impact health along with recommendations to address them, including  discussing with a physician.  Nutrition  Dining Out - Part 2 Clinical staff conducted group or individual video education with verbal and written material and guidebook.  Patient learns how to plan ahead and communicate in order to maximize their dining experience in a healthy and nutritious manner. Included are recommended food choices based on the type of restaurant the patient is visiting.   Fueling a Banker conducted group or individual video education with verbal and written material and guidebook.  There is a strong connection between our food choices and our health. Diseases like obesity and type 2 diabetes are very prevalent and are in large-part due to lifestyle choices. The Pritikin Eating Plan provides plenty of food and hunger-curbing satisfaction. It is easy to follow, affordable, and helps reduce health risks.  Menu Workshop  Clinical staff conducted group or individual video education with verbal and written material and guidebook.  Patient learns that restaurant meals can sabotage health goals because they are often packed with calories, fat, sodium, and sugar. Recommendations include strategies to plan ahead and to communicate with the manager, chef, or server to help order a healthier meal.  Planning Your Eating Strategy  Clinical staff conducted group or individual video education with verbal and written material and guidebook.  Patient learns about the Pritikin Eating Plan and its benefit of reducing the risk of disease. The Pritikin Eating Plan does not focus on calories. Instead, it emphasizes high-quality, nutrient-rich foods. By knowing the characteristics of the foods, we choose, we can determine their calorie density and make informed decisions.  Targeting Your Nutrition Priorities  Clinical staff conducted group or individual video education with verbal and written material and guidebook.  Patient learns that lifestyle habits have a tremendous  impact on disease risk and progression. This video provides eating and physical activity recommendations based on your personal health goals, such as reducing LDL cholesterol, losing weight, preventing or controlling type 2 diabetes, and reducing high blood pressure.  Vitamins and Minerals  Clinical staff conducted group or individual video education with verbal and written material and guidebook.  Patient learns different ways to obtain key vitamins and minerals, including through a recommended healthy diet. It is important to discuss all supplements you take with your doctor.   Healthy Mind-Set    Smoking Cessation  Clinical  staff conducted group or individual video education with verbal and written material and guidebook.  Patient learns that cigarette smoking and tobacco addiction pose a serious health risk which affects millions of people. Stopping smoking will significantly reduce the risk of heart disease, lung disease, and many forms of cancer. Recommended strategies for quitting are covered, including working with your doctor to develop a successful plan.  Culinary   Becoming a Set designer conducted group or individual video education with verbal and written material and guidebook.  Patient learns that cooking at home can be healthy, cost-effective, quick, and puts them in control. Keys to cooking healthy recipes will include looking at your recipe, assessing your equipment needs, planning ahead, making it simple, choosing cost-effective seasonal ingredients, and limiting the use of added fats, salts, and sugars.  Cooking - Breakfast and Snacks  Clinical staff conducted group or individual video education with verbal and written material and guidebook.  Patient learns how important breakfast is to satiety and nutrition through the entire day. Recommendations include key foods to eat during breakfast to help stabilize blood sugar levels and to prevent overeating at meals  later in the day. Planning ahead is also a key component.  Cooking - Educational psychologist conducted group or individual video education with verbal and written material and guidebook.  Patient learns eating strategies to improve overall health, including an approach to cook more at home. Recommendations include thinking of animal protein as a side on your plate rather than center stage and focusing instead on lower calorie dense options like vegetables, fruits, whole grains, and plant-based proteins, such as beans. Making sauces in large quantities to freeze for later and leaving the skin on your vegetables are also recommended to maximize your experience.  Cooking - Healthy Salads and Dressing Clinical staff conducted group or individual video education with verbal and written material and guidebook.  Patient learns that vegetables, fruits, whole grains, and legumes are the foundations of the Pritikin Eating Plan. Recommendations include how to incorporate each of these in flavorful and healthy salads, and how to create homemade salad dressings. Proper handling of ingredients is also covered. Cooking - Soups and [employer] - Soups and Desserts Clinical staff conducted group or individual video education with verbal and written material and guidebook.  Patient learns that Pritikin soups and desserts make for easy, nutritious, and delicious snacks and meal components that are low in sodium, fat, sugar, and calorie density, while high in vitamins, minerals, and filling fiber. Recommendations include simple and healthy ideas for soups and desserts.   Overview     The Pritikin Solution Program Overview Clinical staff conducted group or individual video education with verbal and written material and guidebook.  Patient learns that the results of the Pritikin Program have been documented in more than 100 articles published in peer-reviewed journals, and the benefits include reducing  risk factors for (and, in some cases, even reversing) high cholesterol, high blood pressure, type 2 diabetes, obesity, and more! An overview of the three key pillars of the Pritikin Program will be covered: eating well, doing regular exercise, and having a healthy mind-set.  WORKSHOPS  Exercise: Exercise Basics: Building Your Action Plan Clinical staff led group instruction and group discussion with PowerPoint presentation and patient guidebook. To enhance the learning environment the use of posters, models and videos may be added. At the conclusion of this workshop, patients will comprehend the difference between physical activity and exercise,  as well as the benefits of incorporating both, into their routine. Patients will understand the FITT (Frequency, Intensity, Time, and Type) principle and how to use it to build an exercise action plan. In addition, safety concerns and other considerations for exercise and cardiac rehab will be addressed by the presenter. The purpose of this lesson is to promote a comprehensive and effective weekly exercise routine in order to improve patients' overall level of fitness.   Managing Heart Disease: Your Path to a Healthier Heart Clinical staff led group instruction and group discussion with PowerPoint presentation and patient guidebook. To enhance the learning environment the use of posters, models and videos may be added.At the conclusion of this workshop, patients will understand the anatomy and physiology of the heart. Additionally, they will understand how Pritikin's three pillars impact the risk factors, the progression, and the management of heart disease.  The purpose of this lesson is to provide a high-level overview of the heart, heart disease, and how the Pritikin lifestyle positively impacts risk factors.  Exercise Biomechanics Clinical staff led group instruction and group discussion with PowerPoint presentation and patient guidebook. To enhance  the learning environment the use of posters, models and videos may be added. Patients will learn how the structural parts of their bodies function and how these functions impact their daily activities, movement, and exercise. Patients will learn how to promote a neutral spine, learn how to manage pain, and identify ways to improve their physical movement in order to promote healthy living. The purpose of this lesson is to expose patients to common physical limitations that impact physical activity. Participants will learn practical ways to adapt and manage aches and pains, and to minimize their effect on regular exercise. Patients will learn how to maintain good posture while sitting, walking, and lifting.  Balance Training and Fall Prevention  Clinical staff led group instruction and group discussion with PowerPoint presentation and patient guidebook. To enhance the learning environment the use of posters, models and videos may be added. At the conclusion of this workshop, patients will understand the importance of their sensorimotor skills (vision, proprioception, and the vestibular system) in maintaining their ability to balance as they age. Patients will apply a variety of balancing exercises that are appropriate for their current level of function. Patients will understand the common causes for poor balance, possible solutions to these problems, and ways to modify their physical environment in order to minimize their fall risk. The purpose of this lesson is to teach patients about the importance of maintaining balance as they age and ways to minimize their risk of falling.  WORKSHOPS   Nutrition:  Fueling a Ship broker led group instruction and group discussion with PowerPoint presentation and patient guidebook. To enhance the learning environment the use of posters, models and videos may be added. Patients will review the foundational principles of the Pritikin Eating Plan  and understand what constitutes a serving size in each of the food groups. Patients will also learn Pritikin-friendly foods that are better choices when away from home and review make-ahead meal and snack options. Calorie density will be reviewed and applied to three nutrition priorities: weight maintenance, weight loss, and weight gain. The purpose of this lesson is to reinforce (in a group setting) the key concepts around what patients are recommended to eat and how to apply these guidelines when away from home by planning and selecting Pritikin-friendly options. Patients will understand how calorie density may be adjusted for different weight management  goals.  Mindful Eating  Clinical staff led group instruction and group discussion with PowerPoint presentation and patient guidebook. To enhance the learning environment the use of posters, models and videos may be added. Patients will briefly review the concepts of the Pritikin Eating Plan and the importance of low-calorie dense foods. The concept of mindful eating will be introduced as well as the importance of paying attention to internal hunger signals. Triggers for non-hunger eating and techniques for dealing with triggers will be explored. The purpose of this lesson is to provide patients with the opportunity to review the basic principles of the Pritikin Eating Plan, discuss the value of eating mindfully and how to measure internal cues of hunger and fullness using the Hunger Scale. Patients will also discuss reasons for non-hunger eating and learn strategies to use for controlling emotional eating.  Targeting Your Nutrition Priorities Clinical staff led group instruction and group discussion with PowerPoint presentation and patient guidebook. To enhance the learning environment the use of posters, models and videos may be added. Patients will learn how to determine their genetic susceptibility to disease by reviewing their family history. Patients  will gain insight into the importance of diet as part of an overall healthy lifestyle in mitigating the impact of genetics and other environmental insults. The purpose of this lesson is to provide patients with the opportunity to assess their personal nutrition priorities by looking at their family history, their own health history and current risk factors. Patients will also be able to discuss ways of prioritizing and modifying the Pritikin Eating Plan for their highest risk areas  Menu  Clinical staff led group instruction and group discussion with PowerPoint presentation and patient guidebook. To enhance the learning environment the use of posters, models and videos may be added. Using menus brought in from [employer], or printed from [employer], patients will apply the Pritikin dining out guidelines that were presented in the [employer] video. Patients will also be able to practice these guidelines in a variety of provided scenarios. The purpose of this lesson is to provide patients with the opportunity to practice hands-on learning of the Pritikin Dining Out guidelines with actual menus and practice scenarios.  Label Reading Clinical staff led group instruction and group discussion with PowerPoint presentation and patient guidebook. To enhance the learning environment the use of posters, models and videos may be added. Patients will review and discuss the Pritikin label reading guidelines presented in Pritikin's Label Reading Educational series video. Using fool labels brought in from local grocery stores and markets, patients will apply the label reading guidelines and determine if the packaged food meet the Pritikin guidelines. The purpose of this lesson is to provide patients with the opportunity to review, discuss, and practice hands-on learning of the Pritikin Label Reading guidelines with actual packaged food labels. Cooking School  Pritikin's [employer]  are designed to teach patients ways to prepare quick, simple, and affordable recipes at home. The importance of nutrition's role in chronic disease risk reduction is reflected in its emphasis in the overall Pritikin program. By learning how to prepare essential core Pritikin Eating Plan recipes, patients will increase control over what they eat; be able to customize the flavor of foods without the use of added salt, sugar, or fat; and improve the quality of the food they consume. By learning a set of core recipes which are easily assembled, quickly prepared, and affordable, patients are more likely to prepare more healthy foods at  home. These workshops focus on convenient breakfasts, simple entres, side dishes, and desserts which can be prepared with minimal effort and are consistent with nutrition recommendations for cardiovascular risk reduction. Cooking [employer] are taught by a Armed forces logistics/support/administrative officer (RD) who has been trained by the [employer]. The chef or RD has a clear understanding of the importance of minimizing - if not completely eliminating - added fat, sugar, and sodium in recipes. Throughout the series of Cooking School Workshop sessions, patients will learn about healthy ingredients and efficient methods of cooking to build confidence in their capability to prepare    Cooking School weekly topics:  Adding Flavor- Sodium-Free  Fast and Healthy Breakfasts  Powerhouse Plant-Based Proteins  Satisfying Salads and Dressings  Simple Sides and Sauces  International Cuisine-Spotlight on the [employer] Zones  Delicious Desserts  Savory Soups  [employer] - Meals in a Astronomer Appetizers and Snacks  Comforting Weekend Breakfasts  One-Pot Wonders   Fast Evening Meals  Landscape architect Your Pritikin Plate  WORKSHOPS   Healthy Mindset (Psychosocial):  Focused Goals, Sustainable Changes Clinical staff led group instruction and group discussion  with PowerPoint presentation and patient guidebook. To enhance the learning environment the use of posters, models and videos may be added. Patients will be able to apply effective goal setting strategies to establish at least one personal goal, and then take consistent, meaningful action toward that goal. They will learn to identify common barriers to achieving personal goals and develop strategies to overcome them. Patients will also gain an understanding of how our mind-set can impact our ability to achieve goals and the importance of cultivating a positive and growth-oriented mind-set. The purpose of this lesson is to provide patients with a deeper understanding of how to set and achieve personal goals, as well as the tools and strategies needed to overcome common obstacles which may arise along the way.  From Head to Heart: The Power of a Healthy Outlook  Clinical staff led group instruction and group discussion with PowerPoint presentation and patient guidebook. To enhance the learning environment the use of posters, models and videos may be added. Patients will be able to recognize and describe the impact of emotions and mood on physical health. They will discover the importance of self-care and explore self-care practices which may work for them. Patients will also learn how to utilize the 4 C's to cultivate a healthier outlook and better manage stress and challenges. The purpose of this lesson is to demonstrate to patients how a healthy outlook is an essential part of maintaining good health, especially as they continue their cardiac rehab journey.  Healthy Sleep for a Healthy Heart Clinical staff led group instruction and group discussion with PowerPoint presentation and patient guidebook. To enhance the learning environment the use of posters, models and videos may be added. At the conclusion of this workshop, patients will be able to demonstrate knowledge of the importance of sleep to overall  health, well-being, and quality of life. They will understand the symptoms of, and treatments for, common sleep disorders. Patients will also be able to identify daytime and nighttime behaviors which impact sleep, and they will be able to apply these tools to help manage sleep-related challenges. The purpose of this lesson is to provide patients with a general overview of sleep and outline the importance of quality sleep. Patients will learn about a few of the most common sleep disorders. Patients will also be introduced to  the concept of "sleep hygiene," and discover ways to self-manage certain sleeping problems through simple daily behavior changes. Finally, the workshop will motivate patients by clarifying the links between quality sleep and their goals of heart-healthy living.   Recognizing and Reducing Stress Clinical staff led group instruction and group discussion with PowerPoint presentation and patient guidebook. To enhance the learning environment the use of posters, models and videos may be added. At the conclusion of this workshop, patients will be able to understand the types of stress reactions, differentiate between acute and chronic stress, and recognize the impact that chronic stress has on their health. They will also be able to apply different coping mechanisms, such as reframing negative self-talk. Patients will have the opportunity to practice a variety of stress management techniques, such as deep abdominal breathing, progressive muscle relaxation, and/or guided imagery.  The purpose of this lesson is to educate patients on the role of stress in their lives and to provide healthy techniques for coping with it.  Learning Barriers/Preferences:  Learning Barriers/Preferences - 10/13/23 1138       Learning Barriers/Preferences   Learning Barriers Sight;Hearing   Wears glasses and bilateral hearing aids   Learning Preferences Audio;Skilled Demonstration;Computer/Internet;Verbal  Instruction;Group Instruction;Video;Individual Instruction;Written Material;Pictoral          Education Topics:  Knowledge Questionnaire Score:  Knowledge Questionnaire Score - 10/13/23 1226       Knowledge Questionnaire Score   Pre Score 22/24          Core Components/Risk Factors/Patient Goals at Admission:  Personal Goals and Risk Factors at Admission - 10/13/23 1139       Core Components/Risk Factors/Patient Goals on Admission    Weight Management Weight Maintenance    Hypertension Yes    Intervention Provide education on lifestyle modifcations including regular physical activity/exercise, weight management, moderate sodium restriction and increased consumption of fresh fruit, vegetables, and low fat dairy, alcohol moderation, and smoking cessation.;Monitor prescription use compliance.    Expected Outcomes Short Term: Continued assessment and intervention until BP is < 140/70mm HG in hypertensive participants. < 130/15mm HG in hypertensive participants with diabetes, heart failure or chronic kidney disease.;Long Term: Maintenance of blood pressure at goal levels.    Lipids Yes    Intervention Provide education and support for participant on nutrition & aerobic/resistive exercise along with prescribed medications to achieve LDL 70mg , HDL >40mg .    Expected Outcomes Short Term: Participant states understanding of desired cholesterol values and is compliant with medications prescribed. Participant is following exercise prescription and nutrition guidelines.;Long Term: Cholesterol controlled with medications as prescribed, with individualized exercise RX and with personalized nutrition plan. Value goals: LDL < 70mg , HDL > 40 mg.          Core Components/Risk Factors/Patient Goals Review:    Core Components/Risk Factors/Patient Goals at Discharge (Final Review):    ITP Comments:  ITP Comments     Row Name 10/13/23 1042           ITP Comments Dr. Wilbert Bihari medical  director. Introduction to the Pritikin education program/intensive cardiac rehab. Initial orientation packet reviewed with patient.          Comments: Participant attended orientation for the cardiac rehabilitation program on  10/13/2023  to perform initial intake and exercise walk test. Patient introduced to the Pritikin Program education and orientation packet was reviewed. Completed 6-minute walk test, measurements, initial ITP, and exercise prescription. Vital signs stable. Telemetry-normal sinus rhythm, asymptomatic.   Service time was from 10:31  to 13:24.

## 2023-10-13 NOTE — Progress Notes (Signed)
 Cardiac Rehab Medication Review   Does the patient  feel that his/her medications are working for him/her?  yes  Has the patient been experiencing any side effects to the medications prescribed?  no  Does the patient measure his/her own blood pressure or blood glucose at home?  yes   Does the patient have any problems obtaining medications due to transportation or finances?   no  Understanding of regimen: good Understanding of indications: good Potential of compliance: good    Comments: No questions regarding medications. Spouse helps patient with medications.    Terry [name] 10/13/2023 11:15 AM

## 2023-10-15 DIAGNOSIS — R590 Localized enlarged lymph nodes: Secondary | ICD-10-CM | POA: Diagnosis not present

## 2023-10-17 DIAGNOSIS — H43811 Vitreous degeneration, right eye: Secondary | ICD-10-CM | POA: Diagnosis not present

## 2023-10-17 DIAGNOSIS — H2513 Age-related nuclear cataract, bilateral: Secondary | ICD-10-CM | POA: Diagnosis not present

## 2023-10-17 DIAGNOSIS — H35373 Puckering of macula, bilateral: Secondary | ICD-10-CM | POA: Diagnosis not present

## 2023-10-17 DIAGNOSIS — H35033 Hypertensive retinopathy, bilateral: Secondary | ICD-10-CM | POA: Diagnosis not present

## 2023-10-17 DIAGNOSIS — H43822 Vitreomacular adhesion, left eye: Secondary | ICD-10-CM | POA: Diagnosis not present

## 2023-10-17 DIAGNOSIS — H34832 Tributary (branch) retinal vein occlusion, left eye, with macular edema: Secondary | ICD-10-CM | POA: Diagnosis not present

## 2023-10-17 DIAGNOSIS — H348112 Central retinal vein occlusion, right eye, stable: Secondary | ICD-10-CM | POA: Diagnosis not present

## 2023-10-19 ENCOUNTER — Encounter (HOSPITAL_COMMUNITY)
Admission: RE | Admit: 2023-10-19 | Discharge: 2023-10-19 | Disposition: A | Discharge status: Home / Self Care | Source: Ambulatory Visit | Attending: Cardiology | Admitting: Cardiology

## 2023-10-19 DIAGNOSIS — Z9889 Other specified postprocedural states: Secondary | ICD-10-CM | POA: Insufficient documentation

## 2023-10-19 DIAGNOSIS — Z48812 Encounter for surgical aftercare following surgery on the circulatory system: Secondary | ICD-10-CM | POA: Diagnosis not present

## 2023-10-19 NOTE — Progress Notes (Signed)
 Daily Session Note  Patient Details  Name: Terry [name] MRN: [id number] Date of Birth: [birth year] Referring Provider:   Flowsheet Row INTENSIVE CARDIAC REHAB ORIENT from 10/13/2023 in [facility]  Referring Provider Terry Bergamo, MD    Encounter Date: 10/19/2023  Check In:  Session Check In - 10/19/23 1034       Check-In   Supervising physician immediately available to respond to emergencies [facility] - Physician supervision    Physician(s) Orren Fabry NP    Location [facility]    Staff Present Alm Parkins, MS, ACSM-CEP, CCRP, Exercise Physiologist;Jetta Vannie BS, ACSM-CEP, Exercise Physiologist;Kaylyne Axton Cyrus, RN, Avonne Gal, MS, ACSM-CEP, Exercise Physiologist    Virtual Visit No    Medication changes reported     No    Fall or balance concerns reported    No    Tobacco Cessation No Change   Non smoker   Warm-up and Cool-down Performed as group-led instruction    Resistance Training Performed No    VAD Patient? No    PAD/SET Patient? No      Pain Assessment   Currently in Pain? No/denies    Pain Score 0-No pain    Multiple Pain Sites No          Capillary Blood Glucose: No results found for this or any previous visit (from the past 24 hours).   Exercise Prescription Changes - 10/19/23 1028       Response to Exercise   Blood Pressure (Admit) 112/70    Blood Pressure (Exercise) 152/78    Blood Pressure (Exit) 112/78    Heart Rate (Admit) 69 bpm    Heart Rate (Exercise) 111 bpm    Heart Rate (Exit) 78 bpm    Rating of Perceived Exertion (Exercise) 9    Symptoms None    Comments Off to a good start with exercise.    Duration Continue with 30 min of aerobic exercise without signs/symptoms of physical distress.    Intensity THRR unchanged      Progression   Progression Continue to progress workloads to maintain intensity without signs/symptoms of physical distress.    Average  METs 3.45      Resistance Training   Training Prescription No    Weight Relaxation day, no weights.      Interval Training   Interval Training No      Bike   Level 2    Watts 39    Minutes 15    METs 3.5      Recumbant Elliptical   Level 2    RPM 59    Watts 77    Minutes 15    METs 3.4      Home Exercise Plan   Plans to continue exercise at [employer] (comment)    Frequency Add 3 additional days to program exercise sessions.          Social History   Tobacco Use  Smoking Status Never  Smokeless Tobacco Never    Goals Met:  Exercise tolerated well No report of concerns or symptoms today  Goals Unmet:  Not Applicable  Comments: Pt started cardiac rehab today.  Pt tolerated light exercise without difficulty. VSS, telemetry-Sinus rhythm, asymptomatic.Left eye red from eye injection he received on Monday,  Medication list reconciled. Pt denies barriers to medicaiton compliance.  PSYCHOSOCIAL ASSESSMENT:  PHQ-0. Pt exhibits positive coping skills, hopeful outlook with supportive family. No psychosocial needs identified  at this time, no psychosocial interventions necessary.    Pt enjoys exercising, travelling, spending time with his grandchildren and going to the movies..   Pt oriented to exercise equipment and routine.    Understanding verbalized. Hadassah Elpidio Quan RN BSN    Dr. Wilbert Bihari is Medical Director for Cardiac Rehab at [facility].

## 2023-10-21 ENCOUNTER — Encounter (HOSPITAL_COMMUNITY)
Admission: RE | Admit: 2023-10-21 | Discharge: 2023-10-21 | Disposition: A | Discharge status: Home / Self Care | Source: Ambulatory Visit | Attending: Cardiology | Admitting: Cardiology

## 2023-10-21 DIAGNOSIS — Z9889 Other specified postprocedural states: Secondary | ICD-10-CM

## 2023-10-24 ENCOUNTER — Encounter (HOSPITAL_COMMUNITY)
Admission: RE | Admit: 2023-10-24 | Discharge: 2023-10-24 | Disposition: A | Discharge status: Home / Self Care | Source: Ambulatory Visit | Attending: Cardiology | Admitting: Cardiology

## 2023-10-24 DIAGNOSIS — Z9889 Other specified postprocedural states: Secondary | ICD-10-CM

## 2023-10-25 DIAGNOSIS — B079 Viral wart, unspecified: Secondary | ICD-10-CM | POA: Diagnosis not present

## 2023-10-25 DIAGNOSIS — Z4889 Encounter for other specified surgical aftercare: Secondary | ICD-10-CM | POA: Diagnosis not present

## 2023-10-25 DIAGNOSIS — D485 Neoplasm of uncertain behavior of skin: Secondary | ICD-10-CM | POA: Diagnosis not present

## 2023-10-25 NOTE — Progress Notes (Signed)
 Cardiac Individual Treatment Plan  Patient Details  Name: Terry [name] MRN: [id number] Date of Birth: 06-Mar-1951 Referring Provider:   Flowsheet Row INTENSIVE CARDIAC REHAB ORIENT from 10/13/2023 in [facility]  Referring Provider Terry Bergamo, MD    Initial Encounter Date:  Flowsheet Row INTENSIVE CARDIAC REHAB ORIENT from 10/13/2023 in [facility] for [facility], Vascular, [facility] Lung [facility]  Date 10/13/23    Visit Diagnosis: S/P mitral valve repair  Patient's Home Medications on Admission:  Current Outpatient Medications:    aspirin  EC 81 MG tablet, Take 81 mg by mouth daily. Swallow whole., Disp: , Rfl:    Cholecalciferol (VITAMIN D3 ULTRA STRENGTH) 125 MCG (5000 UT) capsule, Take 5,000 Units by mouth at bedtime., Disp: , Rfl:    ezetimibe  (ZETIA ) 10 MG tablet, TAKE 1 TABLET BY MOUTH EVERY DAY, Disp: 90 tablet, Rfl: 2   fluticasone  (FLONASE ) 50 MCG/ACT nasal spray, Place 2 sprays into both nostrils every evening. , Disp: , Rfl:    loratadine  (CLARITIN ) 10 MG tablet, Take 10 mg by mouth daily., Disp: , Rfl:    magnesium  oxide (MAG-OX) 400 (240 Mg) MG tablet, TAKE 1 TABLET BY MOUTH EVERY DAY. *OTC/NOT COVD*, Disp: 90 tablet, Rfl: 3   metoprolol  succinate (TOPROL -XL) 25 MG 24 hr tablet, Take 12.5 mg by mouth daily., Disp: , Rfl:    Probiotic Product (PROBIOTIC DAILY PO), Take 1 capsule by mouth in the morning. TruNature Advanced Digestive Probiotic, Disp: , Rfl:    rosuvastatin  (CRESTOR ) 20 MG tablet, Take 20 mg by mouth daily., Disp: , Rfl:    senna-docusate (SENOKOT-S) 8.6-50 MG tablet, Take 2 tablets by mouth as needed for mild constipation or moderate constipation., Disp: , Rfl:    vitamin B-12 (CYANOCOBALAMIN ) 100 MCG tablet, Take 100 mcg by mouth daily. (Patient not taking: Reported on 10/13/2023), Disp: , Rfl:    zinc gluconate 50 MG tablet, Take 50 mg by mouth at bedtime., Disp: , Rfl:   Past Medical  History: Past Medical History:  Diagnosis Date   Aortic root dilation (HCC)    a. mild by CT 10/2017.   Basal cell carcinoma 10/02/2019   left hand bcc nod.   Cardiomyopathy [place])    Central retinal vein occlusion    Chronic systolic CHF (congestive heart failure) (HCC)    ED (erectile dysfunction)    H/O cardiac catheterization 2002 - normal cors   HTN (hypertension)    Hyperlipidemia    Hypokalemia    Leg edema    LV dysfunction    a. EF appeared mildly depressed by TEE 08/2015, no % given.   Mini stroke    a. ? Age 56 at time of central retinal vein occlusion - lost vision in eye   PAF (paroxysmal atrial fibrillation) (HCC)    a. Dx 06/2015, s/p TEE/DCCV 08/2015.   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 Cindie   Syncope    Hx of thought to be due to orthostatic hypotension   Visit for monitoring Tikosyn  therapy 01/17/2018    Tobacco Use: Social History   Tobacco Use  Smoking Status Never  Smokeless Tobacco Never    Labs: Review Flowsheet  More data exists      Latest Ref Rng & Units 08/04/2018 10/08/2019 08/07/2021 08/08/2021 07/13/2023  Labs for ITP Cardiac and Pulmonary Rehab  Cholestrol 0 - 200 mg/dL  157  155  - 144  -  LDL (calc) 0 - 99 mg/dL 94  95  - 87  -  HDL-C >40 mg/dL 42  36  - 33  -  Trlycerides <150 mg/dL 892  866  - 878  -  Hemoglobin A1c 4.8 - 5.6 % - - 5.5  - -  PH, Arterial 7.35 - 7.45 - - - - 7.388   PCO2 arterial 32 - 48 mmHg - - - - 38.8   Bicarbonate 20.0 - 28.0 mmol/L - - - - 25.0  25.5  23.4   TCO2 22 - 32 mmol/L - - 26  - 26  27  25    Acid-base deficit 0.0 - 2.0 mmol/L - - - - 1.0  1.0   O2 Saturation % - - - - 72  71  94     Details       Multiple values from one day are sorted in reverse-chronological order         Capillary Blood Glucose: Lab Results  Component Value Date   GLUCAP 98 08/08/2021   GLUCAP 75 08/07/2021   GLUCAP 150 (H) 08/19/2020      Exercise Target Goals: Exercise Program Goal: Individual exercise prescription set using results from initial 6 min walk test and THRR while considering  patient's activity barriers and safety.   Exercise Prescription Goal: Initial exercise prescription builds to 30-45 minutes a day of aerobic activity, 2-3 days per week.  Home exercise guidelines will be given to patient during program as part of exercise prescription that the participant will acknowledge.  Activity Barriers & Risk Stratification:  Activity Barriers & Cardiac Risk Stratification - 10/13/23 1136       Activity Barriers & Cardiac Risk Stratification   Activity Barriers Balance Concerns    Cardiac Risk Stratification High          6 Minute Walk:  6 Minute Walk     Row Name 10/13/23 1304         6 Minute Walk   Phase Initial     Distance 1650 feet     Walk Time 6 minutes     # of Rest Breaks 0     MPH 3.1     METS 4.1     RPE 9     Perceived Dyspnea  0     VO2 Peak 14.4     Symptoms No     Resting HR 71 bpm     Resting BP 118/80     Resting Oxygen Saturation  98 %     Exercise Oxygen Saturation  during 6 min walk 97 %     Max Ex. HR 90 bpm     Max Ex. BP 150/70     2 Minute Post BP 128/72        Oxygen Initial Assessment:   Oxygen Re-Evaluation:   Oxygen Discharge (Final Oxygen Re-Evaluation):   Initial Exercise Prescription:  Initial Exercise Prescription - 10/13/23 1100       Date of Initial Exercise RX and Referring Provider   Date 10/13/23    Referring Provider Terry Bergamo, MD    Expected Discharge Date 01/04/24      Bike   Level 2    Watts 54    Minutes 15    METs 4.1      Recumbant Elliptical   Level 2    RPM 60    Watts 75    Minutes 15  METs 4.1      Prescription Details   Frequency (times per week) 3    Duration Progress to 30 minutes of continuous aerobic without signs/symptoms of physical distress      Intensity   THRR 40-80% of Max Heartrate 60-120     Ratings of Perceived Exertion 11-13    Perceived Dyspnea 0-4      Progression   Progression Continue progressive overload as per policy without signs/symptoms or physical distress.      Resistance Training   Training Prescription Yes    Weight 5 lbs    Reps 10-15          Perform Capillary Blood Glucose checks as needed.  Exercise Prescription Changes:   Exercise Prescription Changes     Row Name 10/19/23 1028             Response to Exercise   Blood Pressure (Admit) 112/70       Blood Pressure (Exercise) 152/78       Blood Pressure (Exit) 112/78       Heart Rate (Admit) 69 bpm       Heart Rate (Exercise) 111 bpm       Heart Rate (Exit) 78 bpm       Rating of Perceived Exertion (Exercise) 9       Symptoms None       Comments Off to a good start with exercise.       Duration Continue with 30 min of aerobic exercise without signs/symptoms of physical distress.       Intensity THRR unchanged         Progression   Progression Continue to progress workloads to maintain intensity without signs/symptoms of physical distress.       Average METs 3.45         Resistance Training   Training Prescription No       Weight Relaxation day, no weights.         Interval Training   Interval Training No         Bike   Level 2       Watts 39       Minutes 15       METs 3.5         Recumbant Elliptical   Level 2       RPM 59       Watts 77       Minutes 15       METs 3.4         Home Exercise Plan   Plans to continue exercise at [employer] (comment)       Frequency Add 3 additional days to program exercise sessions.          Exercise Comments:   Exercise Comments     Row Name 10/19/23 1045           Exercise Comments Terry [name] tolerated first session of exercise well without symptoms. Reviewed his current exercise routine, THRR, and goals with him. Oriented him to the exercise equipment and stretching routine.          Exercise Goals and  Review:   Exercise Goals     Row Name 10/13/23 1134             Exercise Goals   Increase Physical Activity Yes       Intervention Provide advice, education, support and counseling about physical activity/exercise needs.;Develop an individualized exercise prescription for aerobic and resistive  training based on initial evaluation findings, risk stratification, comorbidities and participant's personal goals.       Expected Outcomes Short Term: Attend rehab on a regular basis to increase amount of physical activity.;Long Term: Add in home exercise to make exercise part of routine and to increase amount of physical activity.;Long Term: Exercising regularly at least 3-5 days a week.       Increase Strength and Stamina Yes       Intervention Provide advice, education, support and counseling about physical activity/exercise needs.;Develop an individualized exercise prescription for aerobic and resistive training based on initial evaluation findings, risk stratification, comorbidities and participant's personal goals.       Expected Outcomes Short Term: Increase workloads from initial exercise prescription for resistance, speed, and METs.;Short Term: Perform resistance training exercises routinely during rehab and add in resistance training at home;Long Term: Improve cardiorespiratory fitness, muscular endurance and strength as measured by increased METs and functional capacity ( )       Able to understand and use rate of perceived exertion (RPE) scale Yes       Intervention Provide education and explanation on how to use RPE scale       Expected Outcomes Short Term: Able to use RPE daily in rehab to express subjective intensity level;Long Term:  Able to use RPE to guide intensity level when exercising independently       Knowledge and understanding of Target Heart Rate Range (THRR) Yes       Intervention Provide education and explanation of THRR including how the numbers were predicted and where  they are located for reference       Expected Outcomes Short Term: Able to state/look up THRR;Short Term: Able to use daily as guideline for intensity in rehab;Long Term: Able to use THRR to govern intensity when exercising independently       Understanding of Exercise Prescription Yes       Intervention Provide education, explanation, and written materials on patient's individual exercise prescription       Expected Outcomes Short Term: Able to explain program exercise prescription;Long Term: Able to explain home exercise prescription to exercise independently          Exercise Goals Re-Evaluation :  Exercise Goals Re-Evaluation     Row Name 10/19/23 1045             Exercise Goal Re-Evaluation   Exercise Goals Review Increase Physical Activity;Increase Strength and Stamina;Able to understand and use rate of perceived exertion (RPE) scale;Knowledge and understanding of Target Heart Rate Range (THRR)       Comments Terry [name] was able to understand and use the RPE scale appropriately. He is currently exercising at the gym 6 days/week. He starts with a warm-up 15 minutes walking before proceeding to the stair climber for 60 minutes. He does weight machines and free wieghts alternating muscle groups 3-4 sets of 15 repetitions.       Expected Outcomes Progress workloads as tolerated to help decrease cardiovascular risk.          Discharge Exercise Prescription (Final Exercise Prescription Changes):  Exercise Prescription Changes - 10/19/23 1028       Response to Exercise   Blood Pressure (Admit) 112/70    Blood Pressure (Exercise) 152/78    Blood Pressure (Exit) 112/78    Heart Rate (Admit) 69 bpm    Heart Rate (Exercise) 111 bpm    Heart Rate (Exit) 78 bpm    Rating of Perceived Exertion (Exercise) 9  Symptoms None    Comments Off to a good start with exercise.    Duration Continue with 30 min of aerobic exercise without signs/symptoms of physical distress.    Intensity THRR  unchanged      Progression   Progression Continue to progress workloads to maintain intensity without signs/symptoms of physical distress.    Average METs 3.45      Resistance Training   Training Prescription No    Weight Relaxation day, no weights.      Interval Training   Interval Training No      Bike   Level 2    Watts 39    Minutes 15    METs 3.5      Recumbant Elliptical   Level 2    RPM 59    Watts 77    Minutes 15    METs 3.4      Home Exercise Plan   Plans to continue exercise at [employer] (comment)    Frequency Add 3 additional days to program exercise sessions.          Nutrition:  Target Goals: Understanding of nutrition guidelines, daily intake of sodium 1500mg , cholesterol 200mg , calories 30% from fat and 7% or less from saturated fats, daily to have 5 or more servings of fruits and vegetables.  Biometrics:  Pre Biometrics - 10/13/23 1120       Pre Biometrics   Waist Circumference 38.75 inches    Hip Circumference 39 inches    Waist to Hip Ratio 0.99 %    Triceps Skinfold 9 mm    % Body Fat 23.2 %    Grip Strength 27 kg    Flexibility 0 in   unable to reach   Single Leg Stand 10.93 seconds           Nutrition Therapy Plan and Nutrition Goals:  Nutrition Therapy & Goals - 10/21/23 1034       Nutrition Therapy   Diet Heart Healthy Diet    Drug/Food Interactions Statins/Certain Fruits      Personal Nutrition Goals   Nutrition Goal Patient to identify strategies for reducing cardiovascular risk by attending the [employer]rit[employer]in education and nutrition series weekly.    Personal Goal #2 Patient to improve diet quality by using the plate method as a guide for meal planning to include lean protein/plant protein, fruits, vegetables, whole grains, nonfat dairy as part of a well-balanced diet.    Comments Terry [name] has medical history of s/p MVR, hyperlipidemia, CHF, PAF, stroke. LDL is not at goal. Patient will benefit from participation in  intensive cardiac rehab for nutrition education, exercise, and lifestyle modification.      Intervention Plan   Intervention Prescribe, educate and counsel regarding individualized specific dietary modifications aiming towards targeted core components such as weight, hypertension, lipid management, diabetes, heart failure and other comorbidities.;Nutrition handout(s) given to patient.    Expected Outcomes Short Term Goal: Understand basic principles of dietary content, such as calories, fat, sodium, cholesterol and nutrients.;Long Term Goal: Adherence to prescribed nutrition plan.          Nutrition Assessments:  Nutrition Assessments - 10/21/23 1340       Rate Your Plate Scores   Pre Score 69         MEDIFICTS Score Key: >=70 Need to make dietary changes  40-70 Heart Healthy Diet <= 40 Therapeutic Level Cholesterol Diet   Flowsheet Row INTENSIVE CARDIAC REHAB from 10/21/2023 in [facility] for  Heart, Vascular, & Lung Health  Picture Your Plate Total Score on Admission 69   Picture Your Plate Scores: <59 Unhealthy dietary pattern with much room for improvement. 41-50 Dietary pattern unlikely to meet recommendations for good health and room for improvement. 51-60 More healthful dietary pattern, with some room for improvement.  >60 Healthy dietary pattern, although there may be some specific behaviors that could be improved.    Nutrition Goals Re-Evaluation:  Nutrition Goals Re-Evaluation     Row Name 10/21/23 1034             Goals   Current Weight 175 lb 14.8 oz (79.8 kg)       Comment cholesterol 234, HDL 46, LDL 159, triglycerides 156       Expected Outcome Terry [name] has medical history of s/p MVR, hyperlipidemia, CHF, PAF, stroke. LDL is not at goal. Patient will benefit from participation in intensive cardiac rehab for nutrition education, exercise, and lifestyle modification.          Nutrition Goals Re-Evaluation:  Nutrition Goals  Re-Evaluation     Row Name 10/21/23 1034             Goals   Current Weight 175 lb 14.8 oz (79.8 kg)       Comment cholesterol 234, HDL 46, LDL 159, triglycerides 156       Expected Outcome Terry [name] has medical history of s/p MVR, hyperlipidemia, CHF, PAF, stroke. LDL is not at goal. Patient will benefit from participation in intensive cardiac rehab for nutrition education, exercise, and lifestyle modification.          Nutrition Goals Discharge (Final Nutrition Goals Re-Evaluation):  Nutrition Goals Re-Evaluation - 10/21/23 1034       Goals   Current Weight 175 lb 14.8 oz (79.8 kg)    Comment cholesterol 234, HDL 46, LDL 159, triglycerides 156    Expected Outcome Terry [name] has medical history of s/p MVR, hyperlipidemia, CHF, PAF, stroke. LDL is not at goal. Patient will benefit from participation in intensive cardiac rehab for nutrition education, exercise, and lifestyle modification.          Psychosocial: Target Goals: Acknowledge presence or absence of significant depression and/or stress, maximize coping skills, provide positive support system. Participant is able to verbalize types and ability to use techniques and skills needed for reducing stress and depression.  Initial Review & Psychosocial Screening:  Initial Psych Review & Screening - 10/13/23 1137       Initial Review   Current issues with None Identified      Family Dynamics   Good Support System? Yes   Pt has spouse and daughters for support     Barriers   Psychosocial barriers to participate in program There are no identifiable barriers or psychosocial needs.      Screening Interventions   Interventions Encouraged to exercise          Quality of Life Scores:  Quality of Life - 10/13/23 1354       Quality of Life   Select Quality of Life      Quality of Life Scores   Health/Function Pre 30 %    Socioeconomic Pre 30 %    Psych/Spiritual Pre 30 %    Family Pre 30 %    GLOBAL Pre 30 %          Scores of 19 and below usually indicate a poorer quality of life in these areas.  A difference of  2-3 points is  a clinically meaningful difference.  A difference of 2-3 points in the total score of the Quality of Life Index has been associated with significant improvement in overall quality of life, self-image, physical symptoms, and general health in studies assessing change in quality of life.  PHQ-9: Review Flowsheet       10/13/2023  Depression screen PHQ 2/9  Decreased Interest 0  Down, Depressed, Hopeless 0  PHQ - 2 Score 0  Altered sleeping 0  Tired, decreased energy 0  Change in appetite 0  Feeling bad or failure about yourself  0  Trouble concentrating 0  Moving slowly or fidgety/restless 0  Suicidal thoughts 0  PHQ-9 Score 0  Difficult doing work/chores Not difficult at all   Interpretation of Total Score  Total Score Depression Severity:  1-4 = Minimal depression, 5-9 = Mild depression, 10-14 = Moderate depression, 15-19 = Moderately severe depression, 20-27 = Severe depression   Psychosocial Evaluation and Intervention:   Psychosocial Re-Evaluation:  Psychosocial Re-Evaluation     Row Name 10/19/23 1333             Psychosocial Re-Evaluation   Current issues with None Identified       Interventions Encouraged to attend Cardiac Rehabilitation for the exercise       Continue Psychosocial Services  No Follow up required          Psychosocial Discharge (Final Psychosocial Re-Evaluation):  Psychosocial Re-Evaluation - 10/19/23 1333       Psychosocial Re-Evaluation   Current issues with None Identified    Interventions Encouraged to attend Cardiac Rehabilitation for the exercise    Continue Psychosocial Services  No Follow up required          Vocational Rehabilitation: Provide vocational rehab assistance to qualifying candidates.   Vocational Rehab Evaluation & Intervention:  Vocational Rehab - 10/13/23 1139       Initial Vocational Rehab  Evaluation & Intervention   Assessment shows need for Vocational Rehabilitation No   Pt is retired         Education: Education Goals: Education classes will be provided on a weekly basis, covering required topics. Participant will state understanding/return demonstration of topics presented.    Education     Row Name 10/19/23 1000     Education   Cardiac Education Topics Pritikin   Customer service manager   Weekly Topic Fast and Healthy Breakfasts   Instruction Review Code 1- Verbalizes Understanding      Core Videos: Exercise    Move It!  Clinical staff conducted group or individual video education with verbal and written material and guidebook.  Patient learns the recommended Pritikin exercise program. Exercise with the goal of living a long, healthy life. Some of the health benefits of exercise include controlled diabetes, healthier blood pressure levels, improved cholesterol levels, improved heart and lung capacity, improved sleep, and better body composition. Everyone should speak with their doctor before starting or changing an exercise routine.  Biomechanical Limitations Clinical staff conducted group or individual video education with verbal and written material and guidebook.  Patient learns how biomechanical limitations can impact exercise and how we can mitigate and possibly overcome limitations to have an impactful and balanced exercise routine.  Body Composition Clinical staff conducted group or individual video education with verbal and written material and guidebook.  Patient learns that body composition (ratio of muscle mass to fat mass) is a key component to assessing overall  fitness, rather than body weight alone. Increased fat mass, especially visceral belly fat, can put us  at increased risk for metabolic syndrome, type 2 diabetes, heart disease, and even death. It is recommended to combine diet and exercise  (cardiovascular and resistance training) to improve your body composition. Seek guidance from your physician and exercise physiologist before implementing an exercise routine.  Exercise Action Plan Clinical staff conducted group or individual video education with verbal and written material and guidebook.  Patient learns the recommended strategies to achieve and enjoy long-term exercise adherence, including variety, self-motivation, self-efficacy, and positive decision making. Benefits of exercise include fitness, good health, weight management, more energy, better sleep, less stress, and overall well-being.  Medical   Heart Disease Risk Reduction Clinical staff conducted group or individual video education with verbal and written material and guidebook.  Patient learns our heart is our most vital organ as it circulates oxygen, nutrients, white blood cells, and hormones throughout the entire body, and carries waste away. Data supports a plant-based eating plan like the Pritikin Program for its effectiveness in slowing progression of and reversing heart disease. The video provides a number of recommendations to address heart disease.   Metabolic Syndrome and Belly Fat  Clinical staff conducted group or individual video education with verbal and written material and guidebook.  Patient learns what metabolic syndrome is, how it leads to heart disease, and how one can reverse it and keep it from coming back. You have metabolic syndrome if you have 3 of the following 5 criteria: abdominal obesity, high blood pressure, high triglycerides, low HDL cholesterol, and high blood sugar.  Hypertension and Heart Disease Clinical staff conducted group or individual video education with verbal and written material and guidebook.  Patient learns that high blood pressure, or hypertension, is very common in the [place] . Hypertension is largely due to excessive salt intake, but other important risk factors  include being overweight, physical inactivity, drinking too much alcohol, smoking, and not eating enough potassium from fruits and vegetables. High blood pressure is a leading risk factor for heart attack, stroke, congestive heart failure, dementia, kidney failure, and premature death. Long-term effects of excessive salt intake include stiffening of the arteries and thickening of heart muscle and organ damage. Recommendations include ways to reduce hypertension and the risk of heart disease.  Diseases of Our Time - Focusing on Diabetes Clinical staff conducted group or individual video education with verbal and written material and guidebook.  Patient learns why the best way to stop diseases of our time is prevention, through food and other lifestyle changes. Medicine (such as prescription pills and surgeries) is often only a Band-Aid on the problem, not a long-term solution. Most common diseases of our time include obesity, type 2 diabetes, hypertension, heart disease, and cancer. The Pritikin Program is recommended and has been proven to help reduce, reverse, and/or prevent the damaging effects of metabolic syndrome.  Nutrition   Overview of the Pritikin Eating Plan  Clinical staff conducted group or individual video education with verbal and written material and guidebook.  Patient learns about the Pritikin Eating Plan for disease risk reduction. The Pritikin Eating Plan emphasizes a wide variety of unrefined, minimally-processed carbohydrates, like fruits, vegetables, whole grains, and legumes. Go, Caution, and Stop food choices are explained. Plant-based and lean animal proteins are emphasized. Rationale provided for low sodium intake for blood pressure control, low added sugars for blood sugar stabilization, and low added fats and oils for coronary artery disease risk reduction  and weight management.  Calorie Density  Clinical staff conducted group or individual video education with verbal and  written material and guidebook.  Patient learns about calorie density and how it impacts the Pritikin Eating Plan. Knowing the characteristics of the food you choose will help you decide whether those foods will lead to weight gain or weight loss, and whether you want to consume more or less of them. Weight loss is usually a side effect of the Pritikin Eating Plan because of its focus on low calorie-dense foods.  Label Reading  Clinical staff conducted group or individual video education with verbal and written material and guidebook.  Patient learns about the Pritikin recommended label reading guidelines and corresponding recommendations regarding calorie density, added sugars, sodium content, and whole grains.  Dining Out - Part 1  Clinical staff conducted group or individual video education with verbal and written material and guidebook.  Patient learns that restaurant meals can be sabotaging because they can be so high in calories, fat, sodium, and/or sugar. Patient learns recommended strategies on how to positively address this and avoid unhealthy pitfalls.  Facts on Fats  Clinical staff conducted group or individual video education with verbal and written material and guidebook.  Patient learns that lifestyle modifications can be just as effective, if not more so, as many medications for lowering your risk of heart disease. A Pritikin lifestyle can help to reduce your risk of inflammation and atherosclerosis (cholesterol build-up, or plaque, in the artery walls). Lifestyle interventions such as dietary choices and physical activity address the cause of atherosclerosis. A review of the types of fats and their impact on blood cholesterol levels, along with dietary recommendations to reduce fat intake is also included.  Nutrition Action Plan  Clinical staff conducted group or individual video education with verbal and written material and guidebook.  Patient learns how to incorporate Pritikin  recommendations into their lifestyle. Recommendations include planning and keeping personal health goals in mind as an important part of their success.  Healthy Mind-Set    Healthy Minds, Bodies, Hearts  Clinical staff conducted group or individual video education with verbal and written material and guidebook.  Patient learns how to identify when they are stressed. Video will discuss the impact of that stress, as well as the many benefits of stress management. Patient will also be introduced to stress management techniques. The way we think, act, and feel has an impact on our hearts.  How Our Thoughts Can Heal Our Hearts  Clinical staff conducted group or individual video education with verbal and written material and guidebook.  Patient learns that negative thoughts can cause depression and anxiety. This can result in negative lifestyle behavior and serious health problems. Cognitive behavioral therapy is an effective method to help control our thoughts in order to change and improve our emotional outlook.  Additional Videos:  Exercise    Improving Performance  Clinical staff conducted group or individual video education with verbal and written material and guidebook.  Patient learns to use a non-linear approach by alternating intensity levels and lengths of time spent exercising to help burn more calories and lose more body fat. Cardiovascular exercise helps improve heart health, metabolism, hormonal balance, blood sugar control, and recovery from fatigue. Resistance training improves strength, endurance, balance, coordination, reaction time, metabolism, and muscle mass. Flexibility exercise improves circulation, posture, and balance. Seek guidance from your physician and exercise physiologist before implementing an exercise routine and learn your capabilities and proper form for all exercise.  Introduction to Yoga  Clinical staff conducted group or individual video education with verbal and  written material and guidebook.  Patient learns about yoga, a discipline of the coming together of mind, breath, and body. The benefits of yoga include improved flexibility, improved range of motion, better posture and core strength, increased lung function, weight loss, and positive self-image. Yoga's heart health benefits include lowered blood pressure, healthier heart rate, decreased cholesterol and triglyceride levels, improved immune function, and reduced stress. Seek guidance from your physician and exercise physiologist before implementing an exercise routine and learn your capabilities and proper form for all exercise.  Medical   Aging: Enhancing Your Quality of Life  Clinical staff conducted group or individual video education with verbal and written material and guidebook.  Patient learns key strategies and recommendations to stay in good physical health and enhance quality of life, such as prevention strategies, having an advocate, securing a Health Care Proxy and Power of Attorney, and keeping a list of medications and system for tracking them. It also discusses how to avoid risk for bone loss.  Biology of Weight Control  Clinical staff conducted group or individual video education with verbal and written material and guidebook.  Patient learns that weight gain occurs because we consume more calories than we burn (eating more, moving less). Even if your body weight is normal, you may have higher ratios of fat compared to muscle mass. Too much body fat puts you at increased risk for cardiovascular disease, heart attack, stroke, type 2 diabetes, and obesity-related cancers. In addition to exercise, following the Pritikin Eating Plan can help reduce your risk.  Decoding Lab Results  Clinical staff conducted group or individual video education with verbal and written material and guidebook.  Patient learns that lab test reflects one measurement whose values change over time and are influenced  by many factors, including medication, stress, sleep, exercise, food, hydration, pre-existing medical conditions, and more. It is recommended to use the knowledge from this video to become more involved with your lab results and evaluate your numbers to speak with your doctor.   Diseases of Our Time - Overview  Clinical staff conducted group or individual video education with verbal and written material and guidebook.  Patient learns that according to the CDC, 50% to 70% of chronic diseases (such as obesity, type 2 diabetes, elevated lipids, hypertension, and heart disease) are avoidable through lifestyle improvements including healthier food choices, listening to satiety cues, and increased physical activity.  Sleep Disorders Clinical staff conducted group or individual video education with verbal and written material and guidebook.  Patient learns how good quality and duration of sleep are important to overall health and well-being. Patient also learns about sleep disorders and how they impact health along with recommendations to address them, including discussing with a physician.  Nutrition  Dining Out - Part 2 Clinical staff conducted group or individual video education with verbal and written material and guidebook.  Patient learns how to plan ahead and communicate in order to maximize their dining experience in a healthy and nutritious manner. Included are recommended food choices based on the type of restaurant the patient is visiting.   Fueling a Banker conducted group or individual video education with verbal and written material and guidebook.  There is a strong connection between our food choices and our health. Diseases like obesity and type 2 diabetes are very prevalent and are in large-part due to lifestyle choices. The Pritikin Eating Plan  provides plenty of food and hunger-curbing satisfaction. It is easy to follow, affordable, and helps reduce health  risks.  Menu Workshop  Clinical staff conducted group or individual video education with verbal and written material and guidebook.  Patient learns that restaurant meals can sabotage health goals because they are often packed with calories, fat, sodium, and sugar. Recommendations include strategies to plan ahead and to communicate with the manager, chef, or server to help order a healthier meal.  Planning Your Eating Strategy  Clinical staff conducted group or individual video education with verbal and written material and guidebook.  Patient learns about the Pritikin Eating Plan and its benefit of reducing the risk of disease. The Pritikin Eating Plan does not focus on calories. Instead, it emphasizes high-quality, nutrient-rich foods. By knowing the characteristics of the foods, we choose, we can determine their calorie density and make informed decisions.  Targeting Your Nutrition Priorities  Clinical staff conducted group or individual video education with verbal and written material and guidebook.  Patient learns that lifestyle habits have a tremendous impact on disease risk and progression. This video provides eating and physical activity recommendations based on your personal health goals, such as reducing LDL cholesterol, losing weight, preventing or controlling type 2 diabetes, and reducing high blood pressure.  Vitamins and Minerals  Clinical staff conducted group or individual video education with verbal and written material and guidebook.  Patient learns different ways to obtain key vitamins and minerals, including through a recommended healthy diet. It is important to discuss all supplements you take with your doctor.   Healthy Mind-Set    Smoking Cessation  Clinical staff conducted group or individual video education with verbal and written material and guidebook.  Patient learns that cigarette smoking and tobacco addiction pose a serious health risk which affects millions of  people. Stopping smoking will significantly reduce the risk of heart disease, lung disease, and many forms of cancer. Recommended strategies for quitting are covered, including working with your doctor to develop a successful plan.  Culinary   Becoming a Set designer conducted group or individual video education with verbal and written material and guidebook.  Patient learns that cooking at home can be healthy, cost-effective, quick, and puts them in control. Keys to cooking healthy recipes will include looking at your recipe, assessing your equipment needs, planning ahead, making it simple, choosing cost-effective seasonal ingredients, and limiting the use of added fats, salts, and sugars.  Cooking - Breakfast and Snacks  Clinical staff conducted group or individual video education with verbal and written material and guidebook.  Patient learns how important breakfast is to satiety and nutrition through the entire day. Recommendations include key foods to eat during breakfast to help stabilize blood sugar levels and to prevent overeating at meals later in the day. Planning ahead is also a key component.  Cooking - Educational psychologist conducted group or individual video education with verbal and written material and guidebook.  Patient learns eating strategies to improve overall health, including an approach to cook more at home. Recommendations include thinking of animal protein as a side on your plate rather than center stage and focusing instead on lower calorie dense options like vegetables, fruits, whole grains, and plant-based proteins, such as beans. Making sauces in large quantities to freeze for later and leaving the skin on your vegetables are also recommended to maximize your experience.  Cooking - Healthy Salads and Dressing Clinical staff conducted group or individual  video education with verbal and written material and guidebook.  Patient learns that  vegetables, fruits, whole grains, and legumes are the foundations of the Pritikin Eating Plan. Recommendations include how to incorporate each of these in flavorful and healthy salads, and how to create homemade salad dressings. Proper handling of ingredients is also covered. Cooking - Soups and [employer] - Soups and Desserts Clinical staff conducted group or individual video education with verbal and written material and guidebook.  Patient learns that Pritikin soups and desserts make for easy, nutritious, and delicious snacks and meal components that are low in sodium, fat, sugar, and calorie density, while high in vitamins, minerals, and filling fiber. Recommendations include simple and healthy ideas for soups and desserts.   Overview     The Pritikin Solution Program Overview Clinical staff conducted group or individual video education with verbal and written material and guidebook.  Patient learns that the results of the Pritikin Program have been documented in more than 100 articles published in peer-reviewed journals, and the benefits include reducing risk factors for (and, in some cases, even reversing) high cholesterol, high blood pressure, type 2 diabetes, obesity, and more! An overview of the three key pillars of the Pritikin Program will be covered: eating well, doing regular exercise, and having a healthy mind-set.  WORKSHOPS  Exercise: Exercise Basics: Building Your Action Plan Clinical staff led group instruction and group discussion with PowerPoint presentation and patient guidebook. To enhance the learning environment the use of posters, models and videos may be added. At the conclusion of this workshop, patients will comprehend the difference between physical activity and exercise, as well as the benefits of incorporating both, into their routine. Patients will understand the FITT (Frequency, Intensity, Time, and Type) principle and how to use it to build an exercise action  plan. In addition, safety concerns and other considerations for exercise and cardiac rehab will be addressed by the presenter. The purpose of this lesson is to promote a comprehensive and effective weekly exercise routine in order to improve patients' overall level of fitness.   Managing Heart Disease: Your Path to a Healthier Heart Clinical staff led group instruction and group discussion with PowerPoint presentation and patient guidebook. To enhance the learning environment the use of posters, models and videos may be added.At the conclusion of this workshop, patients will understand the anatomy and physiology of the heart. Additionally, they will understand how Pritikin's three pillars impact the risk factors, the progression, and the management of heart disease.  The purpose of this lesson is to provide a high-level overview of the heart, heart disease, and how the Pritikin lifestyle positively impacts risk factors.  Exercise Biomechanics Clinical staff led group instruction and group discussion with PowerPoint presentation and patient guidebook. To enhance the learning environment the use of posters, models and videos may be added. Patients will learn how the structural parts of their bodies function and how these functions impact their daily activities, movement, and exercise. Patients will learn how to promote a neutral spine, learn how to manage pain, and identify ways to improve their physical movement in order to promote healthy living. The purpose of this lesson is to expose patients to common physical limitations that impact physical activity. Participants will learn practical ways to adapt and manage aches and pains, and to minimize their effect on regular exercise. Patients will learn how to maintain good posture while sitting, walking, and lifting.  Balance Training and Fall Prevention  Clinical staff led group  instruction and group discussion with PowerPoint presentation and  patient guidebook. To enhance the learning environment the use of posters, models and videos may be added. At the conclusion of this workshop, patients will understand the importance of their sensorimotor skills (vision, proprioception, and the vestibular system) in maintaining their ability to balance as they age. Patients will apply a variety of balancing exercises that are appropriate for their current level of function. Patients will understand the common causes for poor balance, possible solutions to these problems, and ways to modify their physical environment in order to minimize their fall risk. The purpose of this lesson is to teach patients about the importance of maintaining balance as they age and ways to minimize their risk of falling.  WORKSHOPS   Nutrition:  Fueling a Ship broker led group instruction and group discussion with PowerPoint presentation and patient guidebook. To enhance the learning environment the use of posters, models and videos may be added. Patients will review the foundational principles of the Pritikin Eating Plan and understand what constitutes a serving size in each of the food groups. Patients will also learn Pritikin-friendly foods that are better choices when away from home and review make-ahead meal and snack options. Calorie density will be reviewed and applied to three nutrition priorities: weight maintenance, weight loss, and weight gain. The purpose of this lesson is to reinforce (in a group setting) the key concepts around what patients are recommended to eat and how to apply these guidelines when away from home by planning and selecting Pritikin-friendly options. Patients will understand how calorie density may be adjusted for different weight management goals.  Mindful Eating  Clinical staff led group instruction and group discussion with PowerPoint presentation and patient guidebook. To enhance the learning environment the use of posters,  models and videos may be added. Patients will briefly review the concepts of the Pritikin Eating Plan and the importance of low-calorie dense foods. The concept of mindful eating will be introduced as well as the importance of paying attention to internal hunger signals. Triggers for non-hunger eating and techniques for dealing with triggers will be explored. The purpose of this lesson is to provide patients with the opportunity to review the basic principles of the Pritikin Eating Plan, discuss the value of eating mindfully and how to measure internal cues of hunger and fullness using the Hunger Scale. Patients will also discuss reasons for non-hunger eating and learn strategies to use for controlling emotional eating.  Targeting Your Nutrition Priorities Clinical staff led group instruction and group discussion with PowerPoint presentation and patient guidebook. To enhance the learning environment the use of posters, models and videos may be added. Patients will learn how to determine their genetic susceptibility to disease by reviewing their family history. Patients will gain insight into the importance of diet as part of an overall healthy lifestyle in mitigating the impact of genetics and other environmental insults. The purpose of this lesson is to provide patients with the opportunity to assess their personal nutrition priorities by looking at their family history, their own health history and current risk factors. Patients will also be able to discuss ways of prioritizing and modifying the Pritikin Eating Plan for their highest risk areas  Menu  Clinical staff led group instruction and group discussion with PowerPoint presentation and patient guidebook. To enhance the learning environment the use of posters, models and videos may be added. Using menus brought in from [employer], or printed from [employer], patients will  apply the Pritikin dining out guidelines that were presented in the  [employer] video. Patients will also be able to practice these guidelines in a variety of provided scenarios. The purpose of this lesson is to provide patients with the opportunity to practice hands-on learning of the Pritikin Dining Out guidelines with actual menus and practice scenarios.  Label Reading Clinical staff led group instruction and group discussion with PowerPoint presentation and patient guidebook. To enhance the learning environment the use of posters, models and videos may be added. Patients will review and discuss the Pritikin label reading guidelines presented in Pritikin's Label Reading Educational series video. Using fool labels brought in from local grocery stores and markets, patients will apply the label reading guidelines and determine if the packaged food meet the Pritikin guidelines. The purpose of this lesson is to provide patients with the opportunity to review, discuss, and practice hands-on learning of the Pritikin Label Reading guidelines with actual packaged food labels. Cooking School  Pritikin's [employer] are designed to teach patients ways to prepare quick, simple, and affordable recipes at home. The importance of nutrition's role in chronic disease risk reduction is reflected in its emphasis in the overall Pritikin program. By learning how to prepare essential core Pritikin Eating Plan recipes, patients will increase control over what they eat; be able to customize the flavor of foods without the use of added salt, sugar, or fat; and improve the quality of the food they consume. By learning a set of core recipes which are easily assembled, quickly prepared, and affordable, patients are more likely to prepare more healthy foods at home. These workshops focus on convenient breakfasts, simple entres, side dishes, and desserts which can be prepared with minimal effort and are consistent with nutrition recommendations for cardiovascular risk  reduction. Cooking [employer] are taught by a Armed forces logistics/support/administrative officer (RD) who has been trained by the [employer]. The chef or RD has a clear understanding of the importance of minimizing - if not completely eliminating - added fat, sugar, and sodium in recipes. Throughout the series of Cooking School Workshop sessions, patients will learn about healthy ingredients and efficient methods of cooking to build confidence in their capability to prepare    Cooking School weekly topics:  Adding Flavor- Sodium-Free  Fast and Healthy Breakfasts  Powerhouse Plant-Based Proteins  Satisfying Salads and Dressings  Simple Sides and Sauces  International Cuisine-Spotlight on the [employer] Zones  Delicious Desserts  Savory Soups  [employer] - Meals in a Astronomer Appetizers and Snacks  Comforting Weekend Breakfasts  One-Pot Wonders   Fast Evening Meals  Landscape architect Your Pritikin Plate  WORKSHOPS   Healthy Mindset (Psychosocial):  Focused Goals, Sustainable Changes Clinical staff led group instruction and group discussion with PowerPoint presentation and patient guidebook. To enhance the learning environment the use of posters, models and videos may be added. Patients will be able to apply effective goal setting strategies to establish at least one personal goal, and then take consistent, meaningful action toward that goal. They will learn to identify common barriers to achieving personal goals and develop strategies to overcome them. Patients will also gain an understanding of how our mind-set can impact our ability to achieve goals and the importance of cultivating a positive and growth-oriented mind-set. The purpose of this lesson is to provide patients with a deeper understanding of how to set and achieve personal goals, as well as the tools and  strategies needed to overcome common obstacles which may arise along the way.  From Head to Heart: The  Power of a Healthy Outlook  Clinical staff led group instruction and group discussion with PowerPoint presentation and patient guidebook. To enhance the learning environment the use of posters, models and videos may be added. Patients will be able to recognize and describe the impact of emotions and mood on physical health. They will discover the importance of self-care and explore self-care practices which may work for them. Patients will also learn how to utilize the 4 C's to cultivate a healthier outlook and better manage stress and challenges. The purpose of this lesson is to demonstrate to patients how a healthy outlook is an essential part of maintaining good health, especially as they continue their cardiac rehab journey.  Healthy Sleep for a Healthy Heart Clinical staff led group instruction and group discussion with PowerPoint presentation and patient guidebook. To enhance the learning environment the use of posters, models and videos may be added. At the conclusion of this workshop, patients will be able to demonstrate knowledge of the importance of sleep to overall health, well-being, and quality of life. They will understand the symptoms of, and treatments for, common sleep disorders. Patients will also be able to identify daytime and nighttime behaviors which impact sleep, and they will be able to apply these tools to help manage sleep-related challenges. The purpose of this lesson is to provide patients with a general overview of sleep and outline the importance of quality sleep. Patients will learn about a few of the most common sleep disorders. Patients will also be introduced to the concept of "sleep hygiene," and discover ways to self-manage certain sleeping problems through simple daily behavior changes. Finally, the workshop will motivate patients by clarifying the links between quality sleep and their goals of heart-healthy living.   Recognizing and Reducing Stress Clinical staff led  group instruction and group discussion with PowerPoint presentation and patient guidebook. To enhance the learning environment the use of posters, models and videos may be added. At the conclusion of this workshop, patients will be able to understand the types of stress reactions, differentiate between acute and chronic stress, and recognize the impact that chronic stress has on their health. They will also be able to apply different coping mechanisms, such as reframing negative self-talk. Patients will have the opportunity to practice a variety of stress management techniques, such as deep abdominal breathing, progressive muscle relaxation, and/or guided imagery.  The purpose of this lesson is to educate patients on the role of stress in their lives and to provide healthy techniques for coping with it.  Learning Barriers/Preferences:  Learning Barriers/Preferences - 10/13/23 1138       Learning Barriers/Preferences   Learning Barriers Sight;Hearing   Wears glasses and bilateral hearing aids   Learning Preferences Audio;Skilled Demonstration;Computer/Internet;Verbal Instruction;Group Instruction;Video;Individual Instruction;Written Material;Pictoral          Education Topics:  Knowledge Questionnaire Score:  Knowledge Questionnaire Score - 10/13/23 1226       Knowledge Questionnaire Score   Pre Score 22/24          Core Components/Risk Factors/Patient Goals at Admission:  Personal Goals and Risk Factors at Admission - 10/13/23 1139       Core Components/Risk Factors/Patient Goals on Admission    Weight Management Weight Maintenance    Hypertension Yes    Intervention Provide education on lifestyle modifcations including regular physical activity/exercise, weight management, moderate sodium restriction and increased consumption  of fresh fruit, vegetables, and low fat dairy, alcohol moderation, and smoking cessation.;Monitor prescription use compliance.    Expected Outcomes Short  Term: Continued assessment and intervention until BP is < 140/39mm HG in hypertensive participants. < 130/63mm HG in hypertensive participants with diabetes, heart failure or chronic kidney disease.;Long Term: Maintenance of blood pressure at goal levels.    Lipids Yes    Intervention Provide education and support for participant on nutrition & aerobic/resistive exercise along with prescribed medications to achieve LDL 70mg , HDL >40mg .    Expected Outcomes Short Term: Participant states understanding of desired cholesterol values and is compliant with medications prescribed. Participant is following exercise prescription and nutrition guidelines.;Long Term: Cholesterol controlled with medications as prescribed, with individualized exercise RX and with personalized nutrition plan. Value goals: LDL < 70mg , HDL > 40 mg.          Core Components/Risk Factors/Patient Goals Review:   Goals and Risk Factor Review     Row Name 10/19/23 1334             Core Components/Risk Factors/Patient Goals Review   Personal Goals Review Weight Management/Obesity;Hypertension;Lipids       Review Terry [name] started cardiac rehab on 10/19/23. Terry [name] did well with exercise. Vital signs were stable.       Expected Outcomes Terry [name] will continue to participate in cardiac rehab for exercise, nutrition and lifestyle modificaitons          Core Components/Risk Factors/Patient Goals at Discharge (Final Review):   Goals and Risk Factor Review - 10/19/23 1334       Core Components/Risk Factors/Patient Goals Review   Personal Goals Review Weight Management/Obesity;Hypertension;Lipids    Review Terry [name] started cardiac rehab on 10/19/23. Terry [name] did well with exercise. Vital signs were stable.    Expected Outcomes Terry [name] will continue to participate in cardiac rehab for exercise, nutrition and lifestyle modificaitons          ITP Comments:  ITP Comments     Row Name 10/13/23 1042 10/19/23 1332         ITP Comments Dr. Wilbert Bihari medical director. Introduction to the [facility] education program/intensive cardiac rehab. Initial orientation packet reviewed with patient. 30 Day ITP Review. Terry [name] started cardiac rehab on 10/19/23. Terry [name] did well with exercise.         Comments: See ITP Comments

## 2023-10-26 ENCOUNTER — Encounter (HOSPITAL_COMMUNITY)
Admission: RE | Admit: 2023-10-26 | Discharge: 2023-10-26 | Disposition: A | Discharge status: Home / Self Care | Source: Ambulatory Visit | Attending: Cardiology | Admitting: Cardiology

## 2023-10-26 DIAGNOSIS — Z9889 Other specified postprocedural states: Secondary | ICD-10-CM

## 2023-10-28 ENCOUNTER — Encounter (HOSPITAL_COMMUNITY)
Admission: RE | Admit: 2023-10-28 | Discharge: 2023-10-28 | Disposition: A | Discharge status: Home / Self Care | Source: Ambulatory Visit | Attending: Cardiology | Admitting: Cardiology

## 2023-10-28 DIAGNOSIS — Z9889 Other specified postprocedural states: Secondary | ICD-10-CM

## 2023-10-30 ENCOUNTER — Other Ambulatory Visit: Payer: Self-pay | Admitting: Cardiology

## 2023-10-31 ENCOUNTER — Encounter (HOSPITAL_COMMUNITY)
Admission: RE | Admit: 2023-10-31 | Discharge: 2023-10-31 | Disposition: A | Discharge status: Home / Self Care | Source: Ambulatory Visit | Attending: Cardiology

## 2023-10-31 DIAGNOSIS — Z9889 Other specified postprocedural states: Secondary | ICD-10-CM | POA: Diagnosis not present

## 2023-11-02 ENCOUNTER — Encounter (HOSPITAL_COMMUNITY)
Admission: RE | Admit: 2023-11-02 | Discharge: 2023-11-02 | Disposition: A | Discharge status: Home / Self Care | Source: Ambulatory Visit | Attending: Cardiology | Admitting: Cardiology

## 2023-11-02 DIAGNOSIS — Z9889 Other specified postprocedural states: Secondary | ICD-10-CM

## 2023-11-04 ENCOUNTER — Encounter (HOSPITAL_COMMUNITY)
Admission: RE | Admit: 2023-11-04 | Discharge: 2023-11-04 | Disposition: A | Discharge status: Home / Self Care | Source: Ambulatory Visit | Attending: Cardiology | Admitting: Cardiology

## 2023-11-04 DIAGNOSIS — Z9889 Other specified postprocedural states: Secondary | ICD-10-CM

## 2023-11-07 ENCOUNTER — Encounter (HOSPITAL_COMMUNITY)
Admission: RE | Admit: 2023-11-07 | Discharge: 2023-11-07 | Disposition: A | Discharge status: Home / Self Care | Source: Ambulatory Visit | Attending: Cardiology | Admitting: Cardiology

## 2023-11-07 DIAGNOSIS — Z9889 Other specified postprocedural states: Secondary | ICD-10-CM | POA: Diagnosis not present

## 2023-11-07 NOTE — Progress Notes (Addendum)
 Reviewed home exercise guidelines with Terry [name] including endpoints, temperature precautions, target heart rate and rate of perceived exertion. Terry [name] is currently exercising at a local gym 3 days/week as his mode of home exercise. He is using the stair climber 15 minutes, and using weight machines and free weights. His goal is to increase strength and energy. Discussed adding 15 minutes to his aerobic workout, and he is ameanble to this. Terry [name] voices understanding of instructions given.  Arnoldo CHRISTELLA Gal, MS, ACSM CEP

## 2023-11-09 ENCOUNTER — Encounter (HOSPITAL_COMMUNITY)
Admission: RE | Admit: 2023-11-09 | Discharge: 2023-11-09 | Disposition: A | Discharge status: Home / Self Care | Source: Ambulatory Visit | Attending: Cardiology | Admitting: Cardiology

## 2023-11-09 DIAGNOSIS — Z9889 Other specified postprocedural states: Secondary | ICD-10-CM

## 2023-11-11 ENCOUNTER — Encounter (HOSPITAL_COMMUNITY)
Admission: RE | Admit: 2023-11-11 | Discharge: 2023-11-11 | Disposition: A | Discharge status: Home / Self Care | Source: Ambulatory Visit | Attending: Cardiology | Admitting: Cardiology

## 2023-11-11 DIAGNOSIS — Z9889 Other specified postprocedural states: Secondary | ICD-10-CM | POA: Diagnosis not present

## 2023-11-16 ENCOUNTER — Encounter (HOSPITAL_COMMUNITY)
Admission: RE | Admit: 2023-11-16 | Discharge: 2023-11-16 | Disposition: A | Discharge status: Home / Self Care | Source: Ambulatory Visit | Attending: Cardiology | Admitting: Cardiology

## 2023-11-16 DIAGNOSIS — Z9889 Other specified postprocedural states: Secondary | ICD-10-CM | POA: Insufficient documentation

## 2023-11-17 ENCOUNTER — Ambulatory Visit: Payer: Self-pay | Admitting: Cardiology

## 2023-11-17 ENCOUNTER — Ambulatory Visit (HOSPITAL_COMMUNITY)
Admission: RE | Admit: 2023-11-17 | Discharge: 2023-11-17 | Disposition: A | Discharge status: Home / Self Care | Source: Ambulatory Visit | Attending: Cardiology | Admitting: Cardiology

## 2023-11-17 DIAGNOSIS — Z95818 Presence of other cardiac implants and grafts: Secondary | ICD-10-CM | POA: Diagnosis not present

## 2023-11-17 DIAGNOSIS — I480 Paroxysmal atrial fibrillation: Secondary | ICD-10-CM | POA: Diagnosis not present

## 2023-11-17 DIAGNOSIS — Z9889 Other specified postprocedural states: Secondary | ICD-10-CM | POA: Insufficient documentation

## 2023-11-17 LAB — ECHOCARDIOGRAM COMPLETE
Area-P 1/2: 2.74 cm2
MV VTI: 1.92 cm2
S' Lateral: 3.1 cm

## 2023-11-17 NOTE — Progress Notes (Signed)
 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

## 2023-11-18 ENCOUNTER — Encounter (HOSPITAL_COMMUNITY)
Admission: RE | Admit: 2023-11-18 | Discharge: 2023-11-18 | Disposition: A | Discharge status: Home / Self Care | Source: Ambulatory Visit | Attending: Cardiology | Admitting: Cardiology

## 2023-11-18 DIAGNOSIS — Z9889 Other specified postprocedural states: Secondary | ICD-10-CM | POA: Diagnosis not present

## 2023-11-21 ENCOUNTER — Encounter (HOSPITAL_COMMUNITY)
Admission: RE | Admit: 2023-11-21 | Discharge: 2023-11-21 | Disposition: A | Discharge status: Home / Self Care | Source: Ambulatory Visit | Attending: Cardiology | Admitting: Cardiology

## 2023-11-21 DIAGNOSIS — Z9889 Other specified postprocedural states: Secondary | ICD-10-CM

## 2023-11-22 NOTE — Progress Notes (Signed)
 Cardiac Individual Treatment Plan  Patient Details  Name: Terry [name] MRN: [id number] Date of Birth: 1950-10-26 Referring Provider:   Flowsheet Row INTENSIVE CARDIAC REHAB ORIENT from 10/13/2023 in [facility]  Referring Provider [name], MD    Initial Encounter Date:  Flowsheet Row INTENSIVE CARDIAC REHAB ORIENT from 10/13/2023 in [facility], [facility]  Date 10/13/23    Visit Diagnosis: S/P mitral valve repair  Patient's Home Medications on Admission:  Current Outpatient Medications:    aspirin  EC 81 MG tablet, Take 81 mg by mouth daily. Swallow whole., Disp: , Rfl:    Cholecalciferol (VITAMIN D3 ULTRA STRENGTH) 125 MCG (5000 UT) capsule, Take 5,000 Units by mouth at bedtime., Disp: , Rfl:    ezetimibe  (ZETIA ) 10 MG tablet, TAKE 1 TABLET BY MOUTH EVERY DAY, Disp: 90 tablet, Rfl: 2   fluticasone  (FLONASE ) 50 MCG/ACT nasal spray, Place 2 sprays into both nostrils every evening. , Disp: , Rfl:    loratadine  (CLARITIN ) 10 MG tablet, Take 10 mg by mouth daily., Disp: , Rfl:    magnesium  oxide (MAG-OX) 400 (240 Mg) MG tablet, TAKE 1 TABLET BY MOUTH EVERY DAY. *OTC/NOT COVD*, Disp: 90 tablet, Rfl: 3   metoprolol  succinate (TOPROL -XL) 25 MG 24 hr tablet, Take 12.5 mg by mouth daily., Disp: , Rfl:    Probiotic Product (PROBIOTIC DAILY PO), Take 1 capsule by mouth in the morning. TruNature Advanced Digestive Probiotic, Disp: , Rfl:    rosuvastatin  (CRESTOR ) 20 MG tablet, TAKE ONE TABLET BY MOUTH THREE TIMES A WEEK, Disp: 36 tablet, Rfl: 3   senna-docusate (SENOKOT-S) 8.6-50 MG tablet, Take 2 tablets by mouth as needed for mild constipation or moderate constipation., Disp: , Rfl:    vitamin B-12 (CYANOCOBALAMIN ) 100 MCG tablet, Take 100 mcg by mouth daily. (Patient not taking: Reported on 10/13/2023), Disp: , Rfl:    zinc gluconate 50 MG tablet, Take 50 mg by mouth at bedtime., Disp: ,  Rfl:   Past Medical History: Past Medical History:  Diagnosis Date   Aortic root dilation (HCC)    a. mild by CT 10/2017.   Basal cell carcinoma 10/02/2019   left hand bcc nod.   Cardiomyopathy [facility])    Central retinal vein occlusion    Chronic systolic CHF (congestive heart failure) (HCC)    ED (erectile dysfunction)    H/O cardiac catheterization 2002 - normal cors   HTN (hypertension)    Hyperlipidemia    Hypokalemia    Leg edema    LV dysfunction    a. EF appeared mildly depressed by TEE 08/2015, no % given.   Mini stroke    a. ? Age 65 at time of central retinal vein occlusion - lost vision in eye   PAF (paroxysmal atrial fibrillation) (HCC)    a. Dx 06/2015, s/p TEE/DCCV 08/2015.   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 Cindie   Syncope    Hx of thought to be due to orthostatic hypotension   Visit for monitoring Tikosyn  therapy 01/17/2018    Tobacco Use: Social History   Tobacco Use  Smoking Status Never  Smokeless Tobacco Never    Labs: Review Flowsheet  More data exists      Latest Ref Rng & Units 08/04/2018 10/08/2019 08/07/2021 08/08/2021 07/13/2023  Labs for ITP Cardiac and Pulmonary Rehab  Cholestrol  0 - 200 mg/dL 842  844  - 855  -  LDL (calc) 0 - 99 mg/dL 94  95  - 87  -  HDL-C >40 mg/dL 42  36  - 33  -  Trlycerides <150 mg/dL 892  866  - 878  -  Hemoglobin A1c 4.8 - 5.6 % - - 5.5  - -  PH, Arterial 7.35 - 7.45 - - - - 7.388   PCO2 arterial 32 - 48 mmHg - - - - 38.8   Bicarbonate 20.0 - 28.0 mmol/L - - - - 25.0  25.5  23.4   TCO2 22 - 32 mmol/L - - 26  - 26  27  25    Acid-base deficit 0.0 - 2.0 mmol/L - - - - 1.0  1.0   O2 Saturation % - - - - 72  71  94     Details       Multiple values from one day are sorted in reverse-chronological order         Capillary Blood Glucose: Lab Results  Component Value Date   GLUCAP 98 08/08/2021   GLUCAP 75 08/07/2021   GLUCAP  150 (H) 08/19/2020     Exercise Target Goals: Exercise Program Goal: Individual exercise prescription set using results from initial 6 min walk test and THRR while considering  patient's activity barriers and safety.   Exercise Prescription Goal: Initial exercise prescription builds to 30-45 minutes a day of aerobic activity, 2-3 days per week.  Home exercise guidelines will be given to patient during program as part of exercise prescription that the participant will acknowledge.  Activity Barriers & Risk Stratification:  Activity Barriers & Cardiac Risk Stratification - 10/13/23 1136       Activity Barriers & Cardiac Risk Stratification   Activity Barriers Balance Concerns    Cardiac Risk Stratification High          6 Minute Walk:  6 Minute Walk     Row Name 10/13/23 1304         6 Minute Walk   Phase Initial     Distance 1650 feet     Walk Time 6 minutes     # of Rest Breaks 0     MPH 3.1     METS 4.1     RPE 9     Perceived Dyspnea  0     VO2 Peak 14.4     Symptoms No     Resting HR 71 bpm     Resting BP 118/80     Resting Oxygen Saturation  98 %     Exercise Oxygen Saturation  during 6 min walk 97 %     Max Ex. HR 90 bpm     Max Ex. BP 150/70     2 Minute Post BP 128/72        Oxygen Initial Assessment:   Oxygen Re-Evaluation:   Oxygen Discharge (Final Oxygen Re-Evaluation):   Initial Exercise Prescription:  Initial Exercise Prescription - 10/13/23 1100       Date of Initial Exercise RX and Referring Provider   Date 10/13/23    Referring Provider [name], MD    Expected Discharge Date 01/04/24      Bike   Level 2    Watts 54    Minutes 15    METs 4.1      Recumbant Elliptical   Level 2    RPM 60    Watts 75  Minutes 15    METs 4.1      Prescription Details   Frequency (times per week) 3    Duration Progress to 30 minutes of continuous aerobic without signs/symptoms of physical distress      Intensity   THRR 40-80% of Max  Heartrate 60-120    Ratings of Perceived Exertion 11-13    Perceived Dyspnea 0-4      Progression   Progression Continue progressive overload as per policy without signs/symptoms or physical distress.      Resistance Training   Training Prescription Yes    Weight 5 lbs    Reps 10-15          Perform Capillary Blood Glucose checks as needed.  Exercise Prescription Changes:   Exercise Prescription Changes     Row Name 10/19/23 1028 10/31/23 1031 11/02/23 1033 11/16/23 1032       Response to Exercise   Blood Pressure (Admit) 112/70 122/68 128/80 114/62    Blood Pressure (Exercise) 152/78 142/74 -- --    Blood Pressure (Exit) 112/78 116/86 110/80 104/52    Heart Rate (Admit) 69 bpm 79 bpm 71 bpm 72 bpm    Heart Rate (Exercise) 111 bpm 108 bpm 108 bpm 104 bpm    Heart Rate (Exit) 78 bpm 76 bpm 78 bpm 75 bpm    Rating of Perceived Exertion (Exercise) 9 10 10 11     Symptoms None None None None    Comments Off to a good start with exercise. -- Heloise Bridegroom on the upright elliptical today. He tolerated well. --    Duration Continue with 30 min of aerobic exercise without signs/symptoms of physical distress. Continue with 30 min of aerobic exercise without signs/symptoms of physical distress. Continue with 30 min of aerobic exercise without signs/symptoms of physical distress. Continue with 30 min of aerobic exercise without signs/symptoms of physical distress.    Intensity THRR unchanged THRR unchanged THRR unchanged THRR unchanged      Progression   Progression Continue to progress workloads to maintain intensity without signs/symptoms of physical distress. Continue to progress workloads to maintain intensity without signs/symptoms of physical distress. Continue to progress workloads to maintain intensity without signs/symptoms of physical distress. Continue to progress workloads to maintain intensity without signs/symptoms of physical distress.    Average METs 3.45 4.2 5.1 4.2       Resistance Training   Training Prescription No Yes No No    Weight Relaxation day, no weights. 5 lbs Relaxation day, no weights. Relaxation day, no weights.    Reps -- 10-15 -- --    Time -- 5 Minutes -- --      Interval Training   Interval Training No No No No      Bike   Level 2 4 4  4.5    Watts 39 49 50 49    Minutes 15 15 15 15     METs 3.5 3.9 4.1 4      Recumbant Elliptical   Level 2 3 -- --    RPM 59 65 -- --    Watts 77 83 -- --    Minutes 15 15 -- --    METs 3.4 4.5 -- --      Elliptical   Level -- -- 1  resistance 1  resistance    Speed -- -- 1  elevation 1  elevation    Minutes -- -- 15 15    METs -- -- 6.1 4.5  Home Exercise Plan   Plans to continue exercise at [employer] (comment) [employer] (comment) Banker (comment) Banker (comment)    Frequency Add 3 additional days to program exercise sessions. Add 3 additional days to program exercise sessions. Add 3 additional days to program exercise sessions. Add 3 additional days to program exercise sessions.    Initial Home Exercises Provided -- -- -- 11/07/23       Exercise Comments:   Exercise Comments     Row Name 10/19/23 1045 11/02/23 1057 11/07/23 1117 11/16/23 1135     Exercise Comments Phenix tolerated first session of exercise well without symptoms. Reviewed his current exercise routine, THRR, and goals with him. Oriented him to the exercise equipment and stretching routine. Switched from the recumbent elliptical to upright elliptical today. Tolerated well without issues. Reviewed home exercise guidelines and goals with Eligha. Discussed increasing workload on the elliptical machine and will increase next session.       Exercise Goals and Review:   Exercise Goals     Row Name 10/13/23 1134             Exercise Goals   Increase Physical Activity Yes       Intervention Provide advice, education, support and counseling about physical activity/exercise  needs.;Develop an individualized exercise prescription for aerobic and resistive training based on initial evaluation findings, risk stratification, comorbidities and participant's personal goals.       Expected Outcomes Short Term: Attend rehab on a regular basis to increase amount of physical activity.;Long Term: Add in home exercise to make exercise part of routine and to increase amount of physical activity.;Long Term: Exercising regularly at least 3-5 days a week.       Increase Strength and Stamina Yes       Intervention Provide advice, education, support and counseling about physical activity/exercise needs.;Develop an individualized exercise prescription for aerobic and resistive training based on initial evaluation findings, risk stratification, comorbidities and participant's personal goals.       Expected Outcomes Short Term: Increase workloads from initial exercise prescription for resistance, speed, and METs.;Short Term: Perform resistance training exercises routinely during rehab and add in resistance training at home;Long Term: Improve cardiorespiratory fitness, muscular endurance and strength as measured by increased METs and functional capacity ( )       Able to understand and use rate of perceived exertion (RPE) scale Yes       Intervention Provide education and explanation on how to use RPE scale       Expected Outcomes Short Term: Able to use RPE daily in rehab to express subjective intensity level;Long Term:  Able to use RPE to guide intensity level when exercising independently       Knowledge and understanding of Target Heart Rate Range (THRR) Yes       Intervention Provide education and explanation of THRR including how the numbers were predicted and where they are located for reference       Expected Outcomes Short Term: Able to state/look up THRR;Short Term: Able to use daily as guideline for intensity in rehab;Long Term: Able to use THRR to govern intensity when exercising  independently       Understanding of Exercise Prescription Yes       Intervention Provide education, explanation, and written materials on patient's individual exercise prescription       Expected Outcomes Short Term: Able to explain program exercise prescription;Long Term: Able to explain home exercise prescription to exercise independently  Exercise Goals Re-Evaluation :  Exercise Goals Re-Evaluation     Row Name 10/19/23 1045 11/07/23 1117           Exercise Goal Re-Evaluation   Exercise Goals Review Increase Physical Activity;Increase Strength and Stamina;Able to understand and use rate of perceived exertion (RPE) scale;Knowledge and understanding of Target Heart Rate Range (THRR) Increase Physical Activity;Increase Strength and Stamina;Able to understand and use rate of perceived exertion (RPE) scale;Knowledge and understanding of Target Heart Rate Range (THRR);Understanding of Exercise Prescription      Comments Rodricus was able to understand and use the RPE scale appropriately. He is currently exercising at the gym 6 days/week. He starts with a warm-up 15 minutes walking before proceeding to the stair climber for 60 minutes. He does weight machines and free wieghts alternating muscle groups 3-4 sets of 15 repetitions. Reviewed exercise prescription with Eligha. He is exercising at the gym 3 days/week in addition to exercise at cardiac rehab. Encourage to increase aerobic exericse from 15 minutes to 30 minutes. he states he's currently working at a medium level of intensity at the gym. His goal is to regain strength and energy and to train without injury.      Expected Outcomes Progress workloads as tolerated to help decrease cardiovascular risk. Eligha will increase aerobic exercise to 30 minutes 3 days/week. Continue weight machines and free weights 3 days/week.         Discharge Exercise Prescription (Final Exercise Prescription Changes):  Exercise Prescription Changes -  11/16/23 1032       Response to Exercise   Blood Pressure (Admit) 114/62    Blood Pressure (Exit) 104/52    Heart Rate (Admit) 72 bpm    Heart Rate (Exercise) 104 bpm    Heart Rate (Exit) 75 bpm    Rating of Perceived Exertion (Exercise) 11    Symptoms None    Duration Continue with 30 min of aerobic exercise without signs/symptoms of physical distress.    Intensity THRR unchanged      Progression   Progression Continue to progress workloads to maintain intensity without signs/symptoms of physical distress.    Average METs 4.2      Resistance Training   Training Prescription No    Weight Relaxation day, no weights.      Interval Training   Interval Training No      Bike   Level 4.5    Watts 49    Minutes 15    METs 4      Elliptical   Level 1   resistance   Speed 1   elevation   Minutes 15    METs 4.5      Home Exercise Plan   Plans to continue exercise at [employer] (comment)    Frequency Add 3 additional days to program exercise sessions.    Initial Home Exercises Provided 11/07/23          Nutrition:  Target Goals: Understanding of nutrition guidelines, daily intake of sodium 1500mg , cholesterol 200mg , calories 30% from fat and 7% or less from saturated fats, daily to have 5 or more servings of fruits and vegetables.  Biometrics:  Pre Biometrics - 10/13/23 1120       Pre Biometrics   Waist Circumference 38.75 inches    Hip Circumference 39 inches    Waist to Hip Ratio 0.99 %    Triceps Skinfold 9 mm    % Body Fat 23.2 %    Grip Strength 27 kg  Flexibility 0 in   unable to reach   Single Leg Stand 10.93 seconds           Nutrition Therapy Plan and Nutrition Goals:  Nutrition Therapy & Goals - 10/21/23 1034       Nutrition Therapy   Diet Heart Healthy Diet    Drug/Food Interactions Statins/Certain Fruits      Personal Nutrition Goals   Nutrition Goal Patient to identify strategies for reducing cardiovascular risk by attending  the [employer] education and nutrition series weekly.    Personal Goal #2 Patient to improve diet quality by using the plate method as a guide for meal planning to include lean protein/plant protein, fruits, vegetables, whole grains, nonfat dairy as part of a well-balanced diet.    Comments Izyk has medical history of s/p MVR, hyperlipidemia, CHF, PAF, stroke. LDL is not at goal. Patient will benefit from participation in intensive cardiac rehab for nutrition education, exercise, and lifestyle modification.      Intervention Plan   Intervention Prescribe, educate and counsel regarding individualized specific dietary modifications aiming towards targeted core components such as weight, hypertension, lipid management, diabetes, heart failure and other comorbidities.;Nutrition handout(s) given to patient.    Expected Outcomes Short Term Goal: Understand basic principles of dietary content, such as calories, fat, sodium, cholesterol and nutrients.;Long Term Goal: Adherence to prescribed nutrition plan.          Nutrition Assessments:  Nutrition Assessments - 10/21/23 1340       Rate Your Plate Scores   Pre Score 69         MEDIFICTS Score Key: >=70 Need to make dietary changes  40-70 Heart Healthy Diet <= 40 Therapeutic Level Cholesterol Diet   Flowsheet Row INTENSIVE CARDIAC REHAB from 10/21/2023 in [facility]  Picture Your Plate Total Score on Admission 69   Picture Your Plate Scores: <59 Unhealthy dietary pattern with much room for improvement. 41-50 Dietary pattern unlikely to meet recommendations for good health and room for improvement. 51-60 More healthful dietary pattern, with some room for improvement.  >60 Healthy dietary pattern, although there may be some specific behaviors that could be improved.    Nutrition Goals Re-Evaluation:  Nutrition Goals Re-Evaluation     Row Name 10/21/23 1034             Goals    Current Weight 175 lb 14.8 oz (79.8 kg)       Comment cholesterol 234, HDL 46, LDL 159, triglycerides 156       Expected Outcome Onofrio has medical history of s/p MVR, hyperlipidemia, CHF, PAF, stroke. LDL is not at goal. Patient will benefit from participation in intensive cardiac rehab for nutrition education, exercise, and lifestyle modification.          Nutrition Goals Re-Evaluation:  Nutrition Goals Re-Evaluation     Row Name 10/21/23 1034             Goals   Current Weight 175 lb 14.8 oz (79.8 kg)       Comment cholesterol 234, HDL 46, LDL 159, triglycerides 156       Expected Outcome Hercules has medical history of s/p MVR, hyperlipidemia, CHF, PAF, stroke. LDL is not at goal. Patient will benefit from participation in intensive cardiac rehab for nutrition education, exercise, and lifestyle modification.          Nutrition Goals Discharge (Final Nutrition Goals Re-Evaluation):  Nutrition Goals Re-Evaluation - 10/21/23  1034       Goals   Current Weight 175 lb 14.8 oz (79.8 kg)    Comment cholesterol 234, HDL 46, LDL 159, triglycerides 156    Expected Outcome Armoni has medical history of s/p MVR, hyperlipidemia, CHF, PAF, stroke. LDL is not at goal. Patient will benefit from participation in intensive cardiac rehab for nutrition education, exercise, and lifestyle modification.          Psychosocial: Target Goals: Acknowledge presence or absence of significant depression and/or stress, maximize coping skills, provide positive support system. Participant is able to verbalize types and ability to use techniques and skills needed for reducing stress and depression.  Initial Review & Psychosocial Screening:  Initial Psych Review & Screening - 10/13/23 1137       Initial Review   Current issues with None Identified      Family Dynamics   Good Support System? Yes   Pt has spouse and daughters for support     Barriers   Psychosocial barriers to participate in  program There are no identifiable barriers or psychosocial needs.      Screening Interventions   Interventions Encouraged to exercise          Quality of Life Scores:  Quality of Life - 10/13/23 1354       Quality of Life   Select Quality of Life      Quality of Life Scores   Health/Function Pre 30 %    Socioeconomic Pre 30 %    Psych/Spiritual Pre 30 %    Family Pre 30 %    GLOBAL Pre 30 %         Scores of 19 and below usually indicate a poorer quality of life in these areas.  A difference of  2-3 points is a clinically meaningful difference.  A difference of 2-3 points in the total score of the Quality of Life Index has been associated with significant improvement in overall quality of life, self-image, physical symptoms, and general health in studies assessing change in quality of life.  PHQ-9: Review Flowsheet       10/13/2023  Depression screen PHQ 2/9  Decreased Interest 0  Down, Depressed, Hopeless 0  PHQ - 2 Score 0  Altered sleeping 0  Tired, decreased energy 0  Change in appetite 0  Feeling bad or failure about yourself  0  Trouble concentrating 0  Moving slowly or fidgety/restless 0  Suicidal thoughts 0  PHQ-9 Score 0  Difficult doing work/chores Not difficult at all   Interpretation of Total Score  Total Score Depression Severity:  1-4 = Minimal depression, 5-9 = Mild depression, 10-14 = Moderate depression, 15-19 = Moderately severe depression, 20-27 = Severe depression   Psychosocial Evaluation and Intervention:   Psychosocial Re-Evaluation:  Psychosocial Re-Evaluation     Row Name 10/19/23 1333 11/21/23 1808           Psychosocial Re-Evaluation   Current issues with None Identified None Identified      Comments -- Eligha mention having to receive eye injections every month      Interventions Encouraged to attend Cardiac Rehabilitation for the exercise Encouraged to attend Cardiac Rehabilitation for the exercise      Continue Psychosocial  Services  No Follow up required No Follow up required         Psychosocial Discharge (Final Psychosocial Re-Evaluation):  Psychosocial Re-Evaluation - 11/21/23 1808       Psychosocial Re-Evaluation   Current issues with  None Identified    Comments Eligha mention having to receive eye injections every month    Interventions Encouraged to attend Cardiac Rehabilitation for the exercise    Continue Psychosocial Services  No Follow up required          Vocational Rehabilitation: Provide vocational rehab assistance to qualifying candidates.   Vocational Rehab Evaluation & Intervention:  Vocational Rehab - 10/13/23 1139       Initial Vocational Rehab Evaluation & Intervention   Assessment shows need for Vocational Rehabilitation No   Pt is retired         Education: Education Goals: Education classes will be provided on a weekly basis, covering required topics. Participant will state understanding/return demonstration of topics presented.    Education     Row Name 10/19/23 1000     Education   Cardiac Education Topics [employer]   Customer service manager   Weekly Topic Fast and Healthy Breakfasts   Instruction Review Code 1- [employer] Understanding    Row Name 10/26/23 1100     Education   Cardiac Education Topics [employer]   Orthoptist   Educator Dietitian   Weekly Topic Personalizing Your [employer] Plate   Instruction Review Code 1- Verbalizes Understanding   Class Start Time 1145   Class Stop Time 1225   Class Time Calculation (min) 40 min    Row Name 11/02/23 1100     Education   Cardiac Education Topics [employer]   Customer service manager   Weekly Topic [employer] Desserts   Instruction Review Code 1- Verbalizes Understanding   Class Start Time 1145   Class Stop Time 1225   Class Time Calculation (min) 40 min    Row Name 11/07/23 1100      Education   Cardiac Education Topics [employer]   Geographical information systems officer Exercise   Exercise Workshop Exercise Basics: Diplomatic Services operational officer   Instruction Review Code 1- Verbalizes Understanding   Class Start Time 1155   Class Stop Time 1248   Class Time Calculation (min) 53 min    Row Name 11/09/23 1100     Education   Cardiac Education Topics [employer]   Customer service manager   Weekly Topic Efficiency Cooking - Meals in a Snap   Instruction Review Code 1- Verbalizes Understanding   Class Start Time 1145   Class Stop Time 1230   Class Time Calculation (min) 45 min    Row Name 11/18/23 1100     Education   Cardiac Education Topics [employer]   Writer General Education   General Education Hypertension and Heart Disease   Instruction Review Code 1- Verbalizes Understanding   Class Start Time 1153   Class Stop Time 1230   Class Time Calculation (min) 37 min    Row Name 11/21/23 1100     Education   Cardiac Education Topics [employer]   Geographical information systems officer Psychosocial   Psychosocial Workshop Focused Goals, Sustainable Changes   Instruction Review Code 1- Verbalizes Understanding   Class Start Time 1155   Class Stop Time  1235   Class Time Calculation (min) 40 min      Core Videos: Exercise    Move It!  Clinical staff conducted group or individual video education with verbal and written material and guidebook.  Patient learns the recommended Pritikin exercise program. Exercise with the goal of living a long, healthy life. Some of the health benefits of exercise include controlled diabetes, healthier blood pressure levels, improved cholesterol levels, improved heart and lung capacity, improved sleep, and better body composition. Everyone should speak with their doctor  before starting or changing an exercise routine.  Biomechanical Limitations Clinical staff conducted group or individual video education with verbal and written material and guidebook.  Patient learns how biomechanical limitations can impact exercise and how we can mitigate and possibly overcome limitations to have an impactful and balanced exercise routine.  Body Composition Clinical staff conducted group or individual video education with verbal and written material and guidebook.  Patient learns that body composition (ratio of muscle mass to fat mass) is a key component to assessing overall fitness, rather than body weight alone. Increased fat mass, especially visceral belly fat, can put us  at increased risk for metabolic syndrome, type 2 diabetes, heart disease, and even death. It is recommended to combine diet and exercise (cardiovascular and resistance training) to improve your body composition. Seek guidance from your physician and exercise physiologist before implementing an exercise routine.  Exercise Action Plan Clinical staff conducted group or individual video education with verbal and written material and guidebook.  Patient learns the recommended strategies to achieve and enjoy long-term exercise adherence, including variety, self-motivation, self-efficacy, and positive decision making. Benefits of exercise include fitness, good health, weight management, more energy, better sleep, less stress, and overall well-being.  Medical   Heart Disease Risk Reduction Clinical staff conducted group or individual video education with verbal and written material and guidebook.  Patient learns our heart is our most vital organ as it circulates oxygen, nutrients, white blood cells, and hormones throughout the entire body, and carries waste away. Data supports a plant-based eating plan like the Pritikin Program for its effectiveness in slowing progression of and reversing heart disease. The video  provides a number of recommendations to address heart disease.   Metabolic Syndrome and Belly Fat  Clinical staff conducted group or individual video education with verbal and written material and guidebook.  Patient learns what metabolic syndrome is, how it leads to heart disease, and how one can reverse it and keep it from coming back. You have metabolic syndrome if you have 3 of the following 5 criteria: abdominal obesity, high blood pressure, high triglycerides, low HDL cholesterol, and high blood sugar.  Hypertension and Heart Disease Clinical staff conducted group or individual video education with verbal and written material and guidebook.  Patient learns that high blood pressure, or hypertension, is very common in the [place] . Hypertension is largely due to excessive salt intake, but other important risk factors include being overweight, physical inactivity, drinking too much alcohol, smoking, and not eating enough potassium from fruits and vegetables. High blood pressure is a leading risk factor for heart attack, stroke, congestive heart failure, dementia, kidney failure, and premature death. Long-term effects of excessive salt intake include stiffening of the arteries and thickening of heart muscle and organ damage. Recommendations include ways to reduce hypertension and the risk of heart disease.  Diseases of Our Time - Focusing on Diabetes Clinical staff conducted group or individual video education with verbal and written material and  guidebook.  Patient learns why the best way to stop diseases of our time is prevention, through food and other lifestyle changes. Medicine (such as prescription pills and surgeries) is often only a Band-Aid on the problem, not a long-term solution. Most common diseases of our time include obesity, type 2 diabetes, hypertension, heart disease, and cancer. The Pritikin Program is recommended and has been proven to help reduce, reverse, and/or prevent the  damaging effects of metabolic syndrome.  Nutrition   Overview of the Pritikin Eating Plan  Clinical staff conducted group or individual video education with verbal and written material and guidebook.  Patient learns about the Pritikin Eating Plan for disease risk reduction. The Pritikin Eating Plan emphasizes a wide variety of unrefined, minimally-processed carbohydrates, like fruits, vegetables, whole grains, and legumes. Go, Caution, and Stop food choices are explained. Plant-based and lean animal proteins are emphasized. Rationale provided for low sodium intake for blood pressure control, low added sugars for blood sugar stabilization, and low added fats and oils for coronary artery disease risk reduction and weight management.  Calorie Density  Clinical staff conducted group or individual video education with verbal and written material and guidebook.  Patient learns about calorie density and how it impacts the Pritikin Eating Plan. Knowing the characteristics of the food you choose will help you decide whether those foods will lead to weight gain or weight loss, and whether you want to consume more or less of them. Weight loss is usually a side effect of the Pritikin Eating Plan because of its focus on low calorie-dense foods.  Label Reading  Clinical staff conducted group or individual video education with verbal and written material and guidebook.  Patient learns about the Pritikin recommended label reading guidelines and corresponding recommendations regarding calorie density, added sugars, sodium content, and whole grains.  Dining Out - Part 1  Clinical staff conducted group or individual video education with verbal and written material and guidebook.  Patient learns that restaurant meals can be sabotaging because they can be so high in calories, fat, sodium, and/or sugar. Patient learns recommended strategies on how to positively address this and avoid unhealthy pitfalls.  Facts on Fats   Clinical staff conducted group or individual video education with verbal and written material and guidebook.  Patient learns that lifestyle modifications can be just as effective, if not more so, as many medications for lowering your risk of heart disease. A Pritikin lifestyle can help to reduce your risk of inflammation and atherosclerosis (cholesterol build-up, or plaque, in the artery walls). Lifestyle interventions such as dietary choices and physical activity address the cause of atherosclerosis. A review of the types of fats and their impact on blood cholesterol levels, along with dietary recommendations to reduce fat intake is also included.  Nutrition Action Plan  Clinical staff conducted group or individual video education with verbal and written material and guidebook.  Patient learns how to incorporate Pritikin recommendations into their lifestyle. Recommendations include planning and keeping personal health goals in mind as an important part of their success.  Healthy Mind-Set    Healthy Minds, Bodies, Hearts  Clinical staff conducted group or individual video education with verbal and written material and guidebook.  Patient learns how to identify when they are stressed. Video will discuss the impact of that stress, as well as the many benefits of stress management. Patient will also be introduced to stress management techniques. The way we think, act, and feel has an impact on our hearts.  How  Our Thoughts Can Heal Our Hearts  Clinical staff conducted group or individual video education with verbal and written material and guidebook.  Patient learns that negative thoughts can cause depression and anxiety. This can result in negative lifestyle behavior and serious health problems. Cognitive behavioral therapy is an effective method to help control our thoughts in order to change and improve our emotional outlook.  Additional Videos:  Exercise    Improving Performance  Clinical  staff conducted group or individual video education with verbal and written material and guidebook.  Patient learns to use a non-linear approach by alternating intensity levels and lengths of time spent exercising to help burn more calories and lose more body fat. Cardiovascular exercise helps improve heart health, metabolism, hormonal balance, blood sugar control, and recovery from fatigue. Resistance training improves strength, endurance, balance, coordination, reaction time, metabolism, and muscle mass. Flexibility exercise improves circulation, posture, and balance. Seek guidance from your physician and exercise physiologist before implementing an exercise routine and learn your capabilities and proper form for all exercise.  Introduction to Yoga  Clinical staff conducted group or individual video education with verbal and written material and guidebook.  Patient learns about yoga, a discipline of the coming together of mind, breath, and body. The benefits of yoga include improved flexibility, improved range of motion, better posture and core strength, increased lung function, weight loss, and positive self-image. Yoga's heart health benefits include lowered blood pressure, healthier heart rate, decreased cholesterol and triglyceride levels, improved immune function, and reduced stress. Seek guidance from your physician and exercise physiologist before implementing an exercise routine and learn your capabilities and proper form for all exercise.  Medical   Aging: Enhancing Your Quality of Life  Clinical staff conducted group or individual video education with verbal and written material and guidebook.  Patient learns key strategies and recommendations to stay in good physical health and enhance quality of life, such as prevention strategies, having an advocate, securing a Health Care Proxy and Power of Attorney, and keeping a list of medications and system for tracking them. It also discusses how to  avoid risk for bone loss.  Biology of Weight Control  Clinical staff conducted group or individual video education with verbal and written material and guidebook.  Patient learns that weight gain occurs because we consume more calories than we burn (eating more, moving less). Even if your body weight is normal, you may have higher ratios of fat compared to muscle mass. Too much body fat puts you at increased risk for cardiovascular disease, heart attack, stroke, type 2 diabetes, and obesity-related cancers. In addition to exercise, following the Pritikin Eating Plan can help reduce your risk.  Decoding Lab Results  Clinical staff conducted group or individual video education with verbal and written material and guidebook.  Patient learns that lab test reflects one measurement whose values change over time and are influenced by many factors, including medication, stress, sleep, exercise, food, hydration, pre-existing medical conditions, and more. It is recommended to use the knowledge from this video to become more involved with your lab results and evaluate your numbers to speak with your doctor.   Diseases of Our Time - Overview  Clinical staff conducted group or individual video education with verbal and written material and guidebook.  Patient learns that according to the CDC, 50% to 70% of chronic diseases (such as obesity, type 2 diabetes, elevated lipids, hypertension, and heart disease) are avoidable through lifestyle improvements including healthier food choices, listening to satiety  cues, and increased physical activity.  Sleep Disorders Clinical staff conducted group or individual video education with verbal and written material and guidebook.  Patient learns how good quality and duration of sleep are important to overall health and well-being. Patient also learns about sleep disorders and how they impact health along with recommendations to address them, including discussing with a  physician.  Nutrition  Dining Out - Part 2 Clinical staff conducted group or individual video education with verbal and written material and guidebook.  Patient learns how to plan ahead and communicate in order to maximize their dining experience in a healthy and nutritious manner. Included are recommended food choices based on the type of restaurant the patient is visiting.   Fueling a Banker conducted group or individual video education with verbal and written material and guidebook.  There is a strong connection between our food choices and our health. Diseases like obesity and type 2 diabetes are very prevalent and are in large-part due to lifestyle choices. The Pritikin Eating Plan provides plenty of food and hunger-curbing satisfaction. It is easy to follow, affordable, and helps reduce health risks.  Menu Workshop  Clinical staff conducted group or individual video education with verbal and written material and guidebook.  Patient learns that restaurant meals can sabotage health goals because they are often packed with calories, fat, sodium, and sugar. Recommendations include strategies to plan ahead and to communicate with the manager, chef, or server to help order a healthier meal.  Planning Your Eating Strategy  Clinical staff conducted group or individual video education with verbal and written material and guidebook.  Patient learns about the Pritikin Eating Plan and its benefit of reducing the risk of disease. The Pritikin Eating Plan does not focus on calories. Instead, it emphasizes high-quality, nutrient-rich foods. By knowing the characteristics of the foods, we choose, we can determine their calorie density and make informed decisions.  Targeting Your Nutrition Priorities  Clinical staff conducted group or individual video education with verbal and written material and guidebook.  Patient learns that lifestyle habits have a tremendous impact on disease  risk and progression. This video provides eating and physical activity recommendations based on your personal health goals, such as reducing LDL cholesterol, losing weight, preventing or controlling type 2 diabetes, and reducing high blood pressure.  Vitamins and Minerals  Clinical staff conducted group or individual video education with verbal and written material and guidebook.  Patient learns different ways to obtain key vitamins and minerals, including through a recommended healthy diet. It is important to discuss all supplements you take with your doctor.   Healthy Mind-Set    Smoking Cessation  Clinical staff conducted group or individual video education with verbal and written material and guidebook.  Patient learns that cigarette smoking and tobacco addiction pose a serious health risk which affects millions of people. Stopping smoking will significantly reduce the risk of heart disease, lung disease, and many forms of cancer. Recommended strategies for quitting are covered, including working with your doctor to develop a successful plan.  Culinary   Becoming a Set designer conducted group or individual video education with verbal and written material and guidebook.  Patient learns that cooking at home can be healthy, cost-effective, quick, and puts them in control. Keys to cooking healthy recipes will include looking at your recipe, assessing your equipment needs, planning ahead, making it simple, choosing cost-effective seasonal ingredients, and limiting the use of added fats, salts, and  sugars.  Cooking - Breakfast and Snacks  Clinical staff conducted group or individual video education with verbal and written material and guidebook.  Patient learns how important breakfast is to satiety and nutrition through the entire day. Recommendations include key foods to eat during breakfast to help stabilize blood sugar levels and to prevent overeating at meals later in the day.  Planning ahead is also a key component.  Cooking - Educational psychologist conducted group or individual video education with verbal and written material and guidebook.  Patient learns eating strategies to improve overall health, including an approach to cook more at home. Recommendations include thinking of animal protein as a side on your plate rather than center stage and focusing instead on lower calorie dense options like vegetables, fruits, whole grains, and plant-based proteins, such as beans. Making sauces in large quantities to freeze for later and leaving the skin on your vegetables are also recommended to maximize your experience.  Cooking - Healthy Salads and Dressing Clinical staff conducted group or individual video education with verbal and written material and guidebook.  Patient learns that vegetables, fruits, whole grains, and legumes are the foundations of the Pritikin Eating Plan. Recommendations include how to incorporate each of these in flavorful and healthy salads, and how to create homemade salad dressings. Proper handling of ingredients is also covered. Cooking - Soups and [employer] - Soups and Desserts Clinical staff conducted group or individual video education with verbal and written material and guidebook.  Patient learns that Pritikin soups and desserts make for easy, nutritious, and delicious snacks and meal components that are low in sodium, fat, sugar, and calorie density, while high in vitamins, minerals, and filling fiber. Recommendations include simple and healthy ideas for soups and desserts.   Overview     The Pritikin Solution Program Overview Clinical staff conducted group or individual video education with verbal and written material and guidebook.  Patient learns that the results of the Pritikin Program have been documented in more than 100 articles published in peer-reviewed journals, and the benefits include reducing risk factors for  (and, in some cases, even reversing) high cholesterol, high blood pressure, type 2 diabetes, obesity, and more! An overview of the three key pillars of the Pritikin Program will be covered: eating well, doing regular exercise, and having a healthy mind-set.  WORKSHOPS  Exercise: Exercise Basics: Building Your Action Plan Clinical staff led group instruction and group discussion with PowerPoint presentation and patient guidebook. To enhance the learning environment the use of posters, models and videos may be added. At the conclusion of this workshop, patients will comprehend the difference between physical activity and exercise, as well as the benefits of incorporating both, into their routine. Patients will understand the FITT (Frequency, Intensity, Time, and Type) principle and how to use it to build an exercise action plan. In addition, safety concerns and other considerations for exercise and cardiac rehab will be addressed by the presenter. The purpose of this lesson is to promote a comprehensive and effective weekly exercise routine in order to improve patients' overall level of fitness.   Managing Heart Disease: Your Path to a Healthier Heart Clinical staff led group instruction and group discussion with PowerPoint presentation and patient guidebook. To enhance the learning environment the use of posters, models and videos may be added.At the conclusion of this workshop, patients will understand the anatomy and physiology of the heart. Additionally, they will understand how Pritikin's three pillars impact the  risk factors, the progression, and the management of heart disease.  The purpose of this lesson is to provide a high-level overview of the heart, heart disease, and how the Pritikin lifestyle positively impacts risk factors.  Exercise Biomechanics Clinical staff led group instruction and group discussion with PowerPoint presentation and patient guidebook. To enhance the learning  environment the use of posters, models and videos may be added. Patients will learn how the structural parts of their bodies function and how these functions impact their daily activities, movement, and exercise. Patients will learn how to promote a neutral spine, learn how to manage pain, and identify ways to improve their physical movement in order to promote healthy living. The purpose of this lesson is to expose patients to common physical limitations that impact physical activity. Participants will learn practical ways to adapt and manage aches and pains, and to minimize their effect on regular exercise. Patients will learn how to maintain good posture while sitting, walking, and lifting.  Balance Training and Fall Prevention  Clinical staff led group instruction and group discussion with PowerPoint presentation and patient guidebook. To enhance the learning environment the use of posters, models and videos may be added. At the conclusion of this workshop, patients will understand the importance of their sensorimotor skills (vision, proprioception, and the vestibular system) in maintaining their ability to balance as they age. Patients will apply a variety of balancing exercises that are appropriate for their current level of function. Patients will understand the common causes for poor balance, possible solutions to these problems, and ways to modify their physical environment in order to minimize their fall risk. The purpose of this lesson is to teach patients about the importance of maintaining balance as they age and ways to minimize their risk of falling.  WORKSHOPS   Nutrition:  Fueling a Ship broker led group instruction and group discussion with PowerPoint presentation and patient guidebook. To enhance the learning environment the use of posters, models and videos may be added. Patients will review the foundational principles of the Pritikin Eating Plan and understand  what constitutes a serving size in each of the food groups. Patients will also learn Pritikin-friendly foods that are better choices when away from home and review make-ahead meal and snack options. Calorie density will be reviewed and applied to three nutrition priorities: weight maintenance, weight loss, and weight gain. The purpose of this lesson is to reinforce (in a group setting) the key concepts around what patients are recommended to eat and how to apply these guidelines when away from home by planning and selecting Pritikin-friendly options. Patients will understand how calorie density may be adjusted for different weight management goals.  Mindful Eating  Clinical staff led group instruction and group discussion with PowerPoint presentation and patient guidebook. To enhance the learning environment the use of posters, models and videos may be added. Patients will briefly review the concepts of the Pritikin Eating Plan and the importance of low-calorie dense foods. The concept of mindful eating will be introduced as well as the importance of paying attention to internal hunger signals. Triggers for non-hunger eating and techniques for dealing with triggers will be explored. The purpose of this lesson is to provide patients with the opportunity to review the basic principles of the Pritikin Eating Plan, discuss the value of eating mindfully and how to measure internal cues of hunger and fullness using the Hunger Scale. Patients will also discuss reasons for non-hunger eating and learn strategies to use  for controlling emotional eating.  Targeting Your Nutrition Priorities Clinical staff led group instruction and group discussion with PowerPoint presentation and patient guidebook. To enhance the learning environment the use of posters, models and videos may be added. Patients will learn how to determine their genetic susceptibility to disease by reviewing their family history. Patients will gain  insight into the importance of diet as part of an overall healthy lifestyle in mitigating the impact of genetics and other environmental insults. The purpose of this lesson is to provide patients with the opportunity to assess their personal nutrition priorities by looking at their family history, their own health history and current risk factors. Patients will also be able to discuss ways of prioritizing and modifying the [facility]ikin Eating Plan for their highest risk areas  Menu  Clinical staff led group instruction and group discussion with PowerPoint presentation and patient guidebook. To enhance the learning environment the use of posters, models and videos may be added. Using menus brought in from [employer], or printed from [employer], patients will apply the Pritikin dining out guidelines that were presented in the [employer] video. Patients will also be able to practice these guidelines in a variety of provided scenarios. The purpose of this lesson is to provide patients with the opportunity to practice hands-on learning of the Pritikin Dining Out guidelines with actual menus and practice scenarios.  Label Reading Clinical staff led group instruction and group discussion with PowerPoint presentation and patient guidebook. To enhance the learning environment the use of posters, models and videos may be added. Patients will review and discuss the Pritikin label reading guidelines presented in [employer]'s Label Reading Educational series video. Using fool labels brought in from local grocery stores and markets, patients will apply the label reading guidelines and determine if the packaged food meet the Pritikin guidelines. The purpose of this lesson is to provide patients with the opportunity to review, discuss, and practice hands-on learning of the Pritikin Label Reading guidelines with actual packaged food labels. Cooking School  [employer]'s [employer] are  designed to teach patients ways to prepare quick, simple, and affordable recipes at home. The importance of nutrition's role in chronic disease risk reduction is reflected in its emphasis in the overall Pritikin program. By learning how to prepare essential core Pritikin Eating Plan recipes, patients will increase control over what they eat; be able to customize the flavor of foods without the use of added salt, sugar, or fat; and improve the quality of the food they consume. By learning a set of core recipes which are easily assembled, quickly prepared, and affordable, patients are more likely to prepare more healthy foods at home. These workshops focus on convenient breakfasts, simple entres, side dishes, and desserts which can be prepared with minimal effort and are consistent with nutrition recommendations for cardiovascular risk reduction. Cooking [employer] are taught by a Armed forces logistics/support/administrative officer (RD) who has been trained by the [employer]. The chef or RD has a clear understanding of the importance of minimizing - if not completely eliminating - added fat, sugar, and sodium in recipes. Throughout the series of Cooking School Workshop sessions, patients will learn about healthy ingredients and efficient methods of cooking to build confidence in their capability to prepare    Cooking School weekly topics:  Adding Flavor- Sodium-Free  Fast and Healthy Breakfasts  Powerhouse Plant-Based Proteins  Satisfying Salads and Dressings  Simple Sides and Sauces  International Cuisine-Spotlight on the  Blue Zones  Delicious Desserts  Savory Soups  [employer] - Meals in a Astronomer Appetizers and Snacks  Comforting Weekend Breakfasts  One-Pot Wonders   Fast [employer] Your Pritikin Plate  WORKSHOPS   Healthy Mindset (Psychosocial):  Focused Goals, Sustainable Changes Clinical staff led group instruction and group discussion  with PowerPoint presentation and patient guidebook. To enhance the learning environment the use of posters, models and videos may be added. Patients will be able to apply effective goal setting strategies to establish at least one personal goal, and then take consistent, meaningful action toward that goal. They will learn to identify common barriers to achieving personal goals and develop strategies to overcome them. Patients will also gain an understanding of how our mind-set can impact our ability to achieve goals and the importance of cultivating a positive and growth-oriented mind-set. The purpose of this lesson is to provide patients with a deeper understanding of how to set and achieve personal goals, as well as the tools and strategies needed to overcome common obstacles which may arise along the way.  From Head to Heart: The Power of a Healthy Outlook  Clinical staff led group instruction and group discussion with PowerPoint presentation and patient guidebook. To enhance the learning environment the use of posters, models and videos may be added. Patients will be able to recognize and describe the impact of emotions and mood on physical health. They will discover the importance of self-care and explore self-care practices which may work for them. Patients will also learn how to utilize the 4 C's to cultivate a healthier outlook and better manage stress and challenges. The purpose of this lesson is to demonstrate to patients how a healthy outlook is an essential part of maintaining good health, especially as they continue their cardiac rehab journey.  Healthy Sleep for a Healthy Heart Clinical staff led group instruction and group discussion with PowerPoint presentation and patient guidebook. To enhance the learning environment the use of posters, models and videos may be added. At the conclusion of this workshop, patients will be able to demonstrate knowledge of the importance of sleep to overall  health, well-being, and quality of life. They will understand the symptoms of, and treatments for, common sleep disorders. Patients will also be able to identify daytime and nighttime behaviors which impact sleep, and they will be able to apply these tools to help manage sleep-related challenges. The purpose of this lesson is to provide patients with a general overview of sleep and outline the importance of quality sleep. Patients will learn about a few of the most common sleep disorders. Patients will also be introduced to the concept of "sleep hygiene," and discover ways to self-manage certain sleeping problems through simple daily behavior changes. Finally, the workshop will motivate patients by clarifying the links between quality sleep and their goals of heart-healthy living.   Recognizing and Reducing Stress Clinical staff led group instruction and group discussion with PowerPoint presentation and patient guidebook. To enhance the learning environment the use of posters, models and videos may be added. At the conclusion of this workshop, patients will be able to understand the types of stress reactions, differentiate between acute and chronic stress, and recognize the impact that chronic stress has on their health. They will also be able to apply different coping mechanisms, such as reframing negative self-talk. Patients will have the opportunity to practice a variety of stress management techniques, such as deep abdominal breathing, progressive  muscle relaxation, and/or guided imagery.  The purpose of this lesson is to educate patients on the role of stress in their lives and to provide healthy techniques for coping with it.  Learning Barriers/Preferences:  Learning Barriers/Preferences - 10/13/23 1138       Learning Barriers/Preferences   Learning Barriers Sight;Hearing   Wears glasses and bilateral hearing aids   Learning Preferences Audio;Skilled Demonstration;Computer/Internet;Verbal  Instruction;Group Instruction;Video;Individual Instruction;Written Material;Pictoral          Education Topics:  Knowledge Questionnaire Score:  Knowledge Questionnaire Score - 10/13/23 1226       Knowledge Questionnaire Score   Pre Score 22/24          Core Components/Risk Factors/Patient Goals at Admission:  Personal Goals and Risk Factors at Admission - 10/13/23 1139       Core Components/Risk Factors/Patient Goals on Admission    Weight Management Weight Maintenance    Hypertension Yes    Intervention Provide education on lifestyle modifcations including regular physical activity/exercise, weight management, moderate sodium restriction and increased consumption of fresh fruit, vegetables, and low fat dairy, alcohol moderation, and smoking cessation.;Monitor prescription use compliance.    Expected Outcomes Short Term: Continued assessment and intervention until BP is < 140/49mm HG in hypertensive participants. < 130/30mm HG in hypertensive participants with diabetes, heart failure or chronic kidney disease.;Long Term: Maintenance of blood pressure at goal levels.    Lipids Yes    Intervention Provide education and support for participant on nutrition & aerobic/resistive exercise along with prescribed medications to achieve LDL 70mg , HDL >40mg .    Expected Outcomes Short Term: Participant states understanding of desired cholesterol values and is compliant with medications prescribed. Participant is following exercise prescription and nutrition guidelines.;Long Term: Cholesterol controlled with medications as prescribed, with individualized exercise RX and with personalized nutrition plan. Value goals: LDL < 70mg , HDL > 40 mg.          Core Components/Risk Factors/Patient Goals Review:   Goals and Risk Factor Review     Row Name 10/19/23 1334 11/21/23 1809           Core Components/Risk Factors/Patient Goals Review   Personal Goals Review Weight  Management/Obesity;Hypertension;Lipids Weight Management/Obesity;Hypertension;Lipids      Review Eligha started cardiac rehab on 10/19/23. Eligha did well with exercise. Vital signs were stable. Eligha is doing well with exercise. Vital signs have been  stable. Eligha has increased his met levels and has lost 2.5 kg since starting the program      Expected Outcomes Eligha will continue to participate in cardiac rehab for exercise, nutrition and lifestyle modificaitons Eligha will continue to participate in cardiac rehab for exercise, nutrition and lifestyle modificaitons         Core Components/Risk Factors/Patient Goals at Discharge (Final Review):   Goals and Risk Factor Review - 11/21/23 1809       Core Components/Risk Factors/Patient Goals Review   Personal Goals Review Weight Management/Obesity;Hypertension;Lipids    Review Eligha is doing well with exercise. Vital signs have been  stable. Eligha has increased his met levels and has lost 2.5 kg since starting the program    Expected Outcomes Eligha will continue to participate in cardiac rehab for exercise, nutrition and lifestyle modificaitons          ITP Comments:  ITP Comments     Row Name 10/13/23 1042 10/19/23 1332 11/21/23 1807       ITP Comments Dr. Wilbert Bihari medical director. Introduction to the [facility] education program/intensive cardiac  rehab. Initial orientation packet reviewed with patient. 30 Day ITP Review. Eligha started cardiac rehab on 10/19/23. Eligha did well with exercise. 30 Day ITP Review. Eligha has good attendance and participation with exercise at  cardiac rehab        Comments: See ITP Comments

## 2023-11-23 ENCOUNTER — Encounter (HOSPITAL_COMMUNITY)
Admission: RE | Admit: 2023-11-23 | Discharge: 2023-11-23 | Disposition: A | Discharge status: Home / Self Care | Source: Ambulatory Visit | Attending: Cardiology | Admitting: Cardiology

## 2023-11-23 DIAGNOSIS — Z9889 Other specified postprocedural states: Secondary | ICD-10-CM | POA: Diagnosis not present

## 2023-11-25 ENCOUNTER — Encounter (HOSPITAL_COMMUNITY)
Admission: RE | Admit: 2023-11-25 | Discharge: 2023-11-25 | Disposition: A | Discharge status: Home / Self Care | Source: Ambulatory Visit | Attending: Cardiology

## 2023-11-25 DIAGNOSIS — Z9889 Other specified postprocedural states: Secondary | ICD-10-CM | POA: Diagnosis not present

## 2023-11-28 ENCOUNTER — Encounter (HOSPITAL_COMMUNITY)
Admission: RE | Admit: 2023-11-28 | Discharge: 2023-11-28 | Disposition: A | Discharge status: Home / Self Care | Source: Ambulatory Visit | Attending: Cardiology

## 2023-11-28 DIAGNOSIS — Z9889 Other specified postprocedural states: Secondary | ICD-10-CM

## 2023-11-29 DIAGNOSIS — H34832 Tributary (branch) retinal vein occlusion, left eye, with macular edema: Secondary | ICD-10-CM | POA: Diagnosis not present

## 2023-11-30 ENCOUNTER — Encounter (HOSPITAL_COMMUNITY)
Admission: RE | Admit: 2023-11-30 | Discharge: 2023-11-30 | Disposition: A | Discharge status: Home / Self Care | Source: Ambulatory Visit | Attending: Cardiology

## 2023-11-30 DIAGNOSIS — Z9889 Other specified postprocedural states: Secondary | ICD-10-CM | POA: Diagnosis not present

## 2023-12-02 ENCOUNTER — Telehealth: Payer: Self-pay | Admitting: Cardiology

## 2023-12-02 ENCOUNTER — Encounter (HOSPITAL_COMMUNITY)
Admission: RE | Admit: 2023-12-02 | Discharge: 2023-12-02 | Disposition: A | Discharge status: Home / Self Care | Source: Ambulatory Visit | Attending: Cardiology | Admitting: Cardiology

## 2023-12-02 DIAGNOSIS — Z23 Encounter for immunization: Secondary | ICD-10-CM | POA: Diagnosis not present

## 2023-12-02 DIAGNOSIS — Z9889 Other specified postprocedural states: Secondary | ICD-10-CM | POA: Diagnosis not present

## 2023-12-02 NOTE — Telephone Encounter (Signed)
 Spoke with pt regarding a flu shot. Pt stated he is interested in getting a flu shot but wondered if he needed to wait longer or skip the flu shot this year since he had a recent cardiac surgery in May. Pt was told his question would be forwarded to Dr. Ladona for his advice. Pt verbalized understanding. All questions if any were answered.

## 2023-12-02 NOTE — Telephone Encounter (Signed)
  Patient would like to know if he is okay to get the flu shot this year. He is not sure if he needs to wait longer before he can get it due to his surgery he had. Please advise.

## 2023-12-03 NOTE — Telephone Encounter (Signed)
 Please have patient take all vaccinations as recommended by his PCP, no contraindication from cardiac standpoint

## 2023-12-05 ENCOUNTER — Encounter (HOSPITAL_COMMUNITY)
Admission: RE | Admit: 2023-12-05 | Discharge: 2023-12-05 | Disposition: A | Discharge status: Home / Self Care | Source: Ambulatory Visit | Attending: Cardiology | Admitting: Cardiology

## 2023-12-05 DIAGNOSIS — Z9889 Other specified postprocedural states: Secondary | ICD-10-CM

## 2023-12-05 NOTE — Telephone Encounter (Signed)
 Called and spoke with patient. Advised patient of Dr. Godfrey note: Please have patient take all vaccinations as recommended by his PCP, no contraindication from cardiac standpoint. Patient verbalized understanding and agreeable to plan.

## 2023-12-07 ENCOUNTER — Encounter (HOSPITAL_COMMUNITY)
Admission: RE | Admit: 2023-12-07 | Discharge: 2023-12-07 | Disposition: A | Discharge status: Home / Self Care | Source: Ambulatory Visit | Attending: Cardiology

## 2023-12-07 DIAGNOSIS — Z9889 Other specified postprocedural states: Secondary | ICD-10-CM | POA: Diagnosis not present

## 2023-12-09 ENCOUNTER — Encounter (HOSPITAL_COMMUNITY)
Admission: RE | Admit: 2023-12-09 | Discharge: 2023-12-09 | Disposition: A | Discharge status: Home / Self Care | Source: Ambulatory Visit | Attending: Cardiology | Admitting: Cardiology

## 2023-12-09 DIAGNOSIS — Z9889 Other specified postprocedural states: Secondary | ICD-10-CM | POA: Diagnosis not present

## 2023-12-12 ENCOUNTER — Encounter (HOSPITAL_COMMUNITY)
Admission: RE | Admit: 2023-12-12 | Discharge: 2023-12-12 | Disposition: A | Source: Ambulatory Visit | Attending: Cardiology

## 2023-12-12 DIAGNOSIS — Z9889 Other specified postprocedural states: Secondary | ICD-10-CM | POA: Diagnosis not present

## 2023-12-14 ENCOUNTER — Encounter (HOSPITAL_COMMUNITY)
Admission: RE | Admit: 2023-12-14 | Discharge: 2023-12-14 | Disposition: A | Discharge status: Home / Self Care | Source: Ambulatory Visit | Attending: Cardiology | Admitting: Cardiology

## 2023-12-14 DIAGNOSIS — Z9889 Other specified postprocedural states: Secondary | ICD-10-CM | POA: Diagnosis not present

## 2023-12-14 NOTE — Addendum Note (Signed)
 Encounter addended by: Jerami Tammen, RN on: 12/14/2023 3:29 PM  Actions taken: Flowsheet accepted

## 2023-12-16 ENCOUNTER — Encounter (HOSPITAL_COMMUNITY)
Admission: RE | Admit: 2023-12-16 | Discharge: 2023-12-16 | Disposition: A | Discharge status: Home / Self Care | Source: Ambulatory Visit | Attending: Cardiology | Admitting: Cardiology

## 2023-12-16 DIAGNOSIS — Z9889 Other specified postprocedural states: Secondary | ICD-10-CM | POA: Diagnosis not present

## 2023-12-19 ENCOUNTER — Encounter (HOSPITAL_COMMUNITY)
Admission: RE | Admit: 2023-12-19 | Discharge: 2023-12-19 | Disposition: A | Discharge status: Home / Self Care | Source: Ambulatory Visit | Attending: Cardiology | Admitting: Cardiology

## 2023-12-19 DIAGNOSIS — Z9889 Other specified postprocedural states: Secondary | ICD-10-CM

## 2023-12-20 NOTE — Progress Notes (Signed)
 Cardiac Individual Treatment Plan  Patient Details  Name: Terry [name] MRN: [id number] Date of Birth: 03-Jan-1951 Referring Provider:   Flowsheet Row INTENSIVE CARDIAC REHAB ORIENT from 10/13/2023 in [facility]  Referring Provider [name], MD    Initial Encounter Date:  Flowsheet Row INTENSIVE CARDIAC REHAB ORIENT from 10/13/2023 in [facility]  Date 10/13/23    Visit Diagnosis: S/P mitral valve repair  Patient's Home Medications on Admission:  Current Outpatient Medications:    aspirin  EC 81 MG tablet, Take 81 mg by mouth daily. Swallow whole., Disp: , Rfl:    Cholecalciferol (VITAMIN D3 ULTRA STRENGTH) 125 MCG (5000 UT) capsule, Take 5,000 Units by mouth at bedtime., Disp: , Rfl:    ezetimibe  (ZETIA ) 10 MG tablet, TAKE 1 TABLET BY MOUTH EVERY DAY, Disp: 90 tablet, Rfl: 2   fluticasone  (FLONASE ) 50 MCG/ACT nasal spray, Place 2 sprays into both nostrils every evening. , Disp: , Rfl:    loratadine  (CLARITIN ) 10 MG tablet, Take 10 mg by mouth daily., Disp: , Rfl:    magnesium  oxide (MAG-OX) 400 (240 Mg) MG tablet, TAKE 1 TABLET BY MOUTH EVERY DAY. *OTC/NOT COVD*, Disp: 90 tablet, Rfl: 3   metoprolol  succinate (TOPROL -XL) 25 MG 24 hr tablet, Take 12.5 mg by mouth daily., Disp: , Rfl:    Probiotic Product (PROBIOTIC DAILY PO), Take 1 capsule by mouth in the morning. TruNature Advanced Digestive Probiotic, Disp: , Rfl:    rosuvastatin  (CRESTOR ) 20 MG tablet, TAKE ONE TABLET BY MOUTH THREE TIMES A WEEK, Disp: 36 tablet, Rfl: 3   senna-docusate (SENOKOT-S) 8.6-50 MG tablet, Take 2 tablets by mouth as needed for mild constipation or moderate constipation., Disp: , Rfl:    vitamin B-12 (CYANOCOBALAMIN ) 100 MCG tablet, Take 100 mcg by mouth daily. (Patient not taking: Reported on 10/13/2023), Disp: , Rfl:    zinc gluconate 50 MG tablet, Take 50 mg by mouth at bedtime., Disp: ,  Rfl:   Past Medical History: Past Medical History:  Diagnosis Date   Aortic root dilation    a. mild by CT 10/2017.   Basal cell carcinoma 10/02/2019   left hand bcc nod.   Cardiomyopathy [facility])    Central retinal vein occlusion    Chronic systolic CHF (congestive heart failure) (HCC)    ED (erectile dysfunction)    H/O cardiac catheterization 2002 - normal cors   HTN (hypertension)    Hyperlipidemia    Hypokalemia    Leg edema    LV dysfunction    a. EF appeared mildly depressed by TEE 08/2015, no % given.   Mini stroke    a. ? Age 61 at time of central retinal vein occlusion - lost vision in eye   PAF (paroxysmal atrial fibrillation) (HCC)    a. Dx 06/2015, s/p TEE/DCCV 08/2015.   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 Cindie   Syncope    Hx of thought to be due to orthostatic hypotension   Visit for monitoring Tikosyn  therapy 01/17/2018    Tobacco Use: Social History   Tobacco Use  Smoking Status Never  Smokeless Tobacco Never    Labs: Review Flowsheet  More data exists      Latest Ref Rng & Units 08/04/2018 10/08/2019 08/07/2021 08/08/2021 07/13/2023  Labs for ITP Cardiac and Pulmonary Rehab  Cholestrol 0 -  200 mg/dL 842  844  - 855  -  LDL (calc) 0 - 99 mg/dL 94  95  - 87  -  HDL-C >40 mg/dL 42  36  - 33  -  Trlycerides <150 mg/dL 892  866  - 878  -  Hemoglobin A1c 4.8 - 5.6 % - - 5.5  - -  PH, Arterial 7.35 - 7.45 - - - - 7.388   PCO2 arterial 32 - 48 mmHg - - - - 38.8   Bicarbonate 20.0 - 28.0 mmol/L - - - - 25.0  25.5  23.4   TCO2 22 - 32 mmol/L - - 26  - 26  27  25    Acid-base deficit 0.0 - 2.0 mmol/L - - - - 1.0  1.0   O2 Saturation % - - - - 72  71  94     Details       Multiple values from one day are sorted in reverse-chronological order          Exercise Target Goals: Exercise Program Goal: Individual exercise prescription set using results from initial 6 min walk  test and THRR while considering  patient's activity barriers and safety.   Exercise Prescription Goal: Initial exercise prescription builds to 30-45 minutes a day of aerobic activity, 2-3 days per week.  Home exercise guidelines will be given to patient during program as part of exercise prescription that the participant will acknowledge.   Education: Aerobic Exercise: - Group verbal and visual presentation on the components of exercise prescription. Introduces F.I.T.T principle from ACSM for exercise prescriptions.  Reviews F.I.T.T. principles of aerobic exercise including progression. Written material provided at class time.   Education: Resistance Exercise: - Group verbal and visual presentation on the components of exercise prescription. Introduces F.I.T.T principle from ACSM for exercise prescriptions  Reviews F.I.T.T. principles of resistance exercise including progression. Written material provided at class time.    Education: Exercise & Equipment Safety: - Individual verbal instruction and demonstration of equipment use and safety with use of the equipment.   Education: Exercise Physiology & General Exercise Guidelines: - Group verbal and written instruction with models to review the exercise physiology of the cardiovascular system and associated critical values. Provides general exercise guidelines with specific guidelines to those with heart or lung disease. Written material provided at class time.   Education: Flexibility, Balance, Mind/Body Relaxation: - Group verbal and visual presentation with interactive activity on the components of exercise prescription. Introduces F.I.T.T principle from ACSM for exercise prescriptions. Reviews F.I.T.T. principles of flexibility and balance exercise training including progression. Also discusses the mind body connection.  Reviews various relaxation techniques to help reduce and manage stress (i.e. Deep breathing, progressive muscle relaxation,  and visualization). Balance handout provided to take home. Written material provided at class time.   Activity Barriers & Risk Stratification:  Activity Barriers & Cardiac Risk Stratification - 10/13/23 1136       Activity Barriers & Cardiac Risk Stratification   Activity Barriers Balance Concerns    Cardiac Risk Stratification High          6 Minute Walk:  6 Minute Walk     Row Name 10/13/23 1304         6 Minute Walk   Phase Initial     Distance 1650 feet     Walk Time 6 minutes     # of Rest Breaks 0     MPH 3.1     METS 4.1  RPE 9     Perceived Dyspnea  0     VO2 Peak 14.4     Symptoms No     Resting HR 71 bpm     Resting BP 118/80     Resting Oxygen Saturation  98 %     Exercise Oxygen Saturation  during 6 min walk 97 %     Max Ex. HR 90 bpm     Max Ex. BP 150/70     2 Minute Post BP 128/72        Oxygen Initial Assessment:   Oxygen Re-Evaluation:   Oxygen Discharge (Final Oxygen Re-Evaluation):   Initial Exercise Prescription:  Initial Exercise Prescription - 10/13/23 1100       Date of Initial Exercise RX and Referring Provider   Date 10/13/23    Referring Provider [name], MD    Expected Discharge Date 01/04/24      Bike   Level 2    Watts 54    Minutes 15    METs 4.1      Recumbant Elliptical   Level 2    RPM 60    Watts 75    Minutes 15    METs 4.1      Prescription Details   Frequency (times per week) 3    Duration Progress to 30 minutes of continuous aerobic without signs/symptoms of physical distress      Intensity   THRR 40-80% of Max Heartrate 60-120    Ratings of Perceived Exertion 11-13    Perceived Dyspnea 0-4      Progression   Progression Continue progressive overload as per policy without signs/symptoms or physical distress.      Resistance Training   Training Prescription Yes    Weight 5 lbs    Reps 10-15          Perform Capillary Blood Glucose checks as needed.  Exercise Prescription  Changes:   Exercise Prescription Changes     Row Name 10/19/23 1028 10/31/23 1031 11/02/23 1033 11/16/23 1032 12/05/23 1032     Response to Exercise   Blood Pressure (Admit) 112/70 122/68 128/80 114/62 104/70   Blood Pressure (Exercise) 152/78 142/74 -- -- --   Blood Pressure (Exit) 112/78 116/86 110/80 104/52 98/60   Heart Rate (Admit) 69 bpm 79 bpm 71 bpm 72 bpm 80 bpm   Heart Rate (Exercise) 111 bpm 108 bpm 108 bpm 104 bpm 115 bpm   Heart Rate (Exit) 78 bpm 76 bpm 78 bpm 75 bpm 87 bpm   Rating of Perceived Exertion (Exercise) 9 10 10 11 10    Symptoms None None None None None   Comments Off to a good start with exercise. -- Heloise Bridegroom on the upright elliptical today. He tolerated well. -- Reviewed goals with Bridegroom. Increased level on bike.   Duration Continue with 30 min of aerobic exercise without signs/symptoms of physical distress. Continue with 30 min of aerobic exercise without signs/symptoms of physical distress. Continue with 30 min of aerobic exercise without signs/symptoms of physical distress. Continue with 30 min of aerobic exercise without signs/symptoms of physical distress. Continue with 30 min of aerobic exercise without signs/symptoms of physical distress.   Intensity THRR unchanged THRR unchanged THRR unchanged THRR unchanged THRR unchanged     Progression   Progression Continue to progress workloads to maintain intensity without signs/symptoms of physical distress. Continue to progress workloads to maintain intensity without signs/symptoms of physical distress. Continue to progress workloads to maintain intensity  without signs/symptoms of physical distress. Continue to progress workloads to maintain intensity without signs/symptoms of physical distress. Continue to progress workloads to maintain intensity without signs/symptoms of physical distress.   Average METs 3.45 4.2 5.1 4.2 5.3     Resistance Training   Training Prescription No Yes No No Yes   Weight Relaxation  day, no weights. 5 lbs Relaxation day, no weights. Relaxation day, no weights. 5 lbs   Reps -- 10-15 -- -- 10-15   Time -- 5 Minutes -- -- 5 Minutes     Interval Training   Interval Training No No No No No     Bike   Level 2 4 4  4.5 6   Watts 39 49 50 49 69   Minutes 15 15 15 15 15    METs 3.5 3.9 4.1 4 4.9     Recumbant Elliptical   Level 2 3 -- -- --   RPM 59 65 -- -- --   Watts 77 83 -- -- --   Minutes 15 15 -- -- --   METs 3.4 4.5 -- -- --     Elliptical   Level -- -- 1  resistance 1  resistance 3  resistance   Speed -- -- 1  elevation 1  elevation 3  elevation   Minutes -- -- 15 15 15    METs -- -- 6.1 4.5 5.8     Home Exercise Plan   Plans to continue exercise at [employer] (comment) [employer] (comment) [employer]er (comment) Banker (comment) Banker (comment)   Frequency Add 3 additional days to program exercise sessions. Add 3 additional days to program exercise sessions. Add 3 additional days to program exercise sessions. Add 3 additional days to program exercise sessions. Add 3 additional days to program exercise sessions.   Initial Home Exercises Provided -- -- -- 11/07/23 11/07/23    Row Name 12/19/23 1030             Response to Exercise   Blood Pressure (Admit) 118/86       Blood Pressure (Exit) 100/60       Heart Rate (Admit) 69 bpm       Heart Rate (Exercise) 103 bpm       Heart Rate (Exit) 77 bpm       Rating of Perceived Exertion (Exercise) 11       Symptoms None       Duration Continue with 30 min of aerobic exercise without signs/symptoms of physical distress.       Intensity THRR unchanged         Progression   Progression Continue to progress workloads to maintain intensity without signs/symptoms of physical distress.       Average METs 5.1         Resistance Training   Training Prescription Yes       Weight 5 lbs       Reps 10-15       Time 5 Minutes         Interval Training   Interval  Training No         Bike   Level 6       Watts 56       Minutes 15       METs 4.2         Elliptical   Level 3  resistance       Speed 3  elevation       Minutes 15  METs 6         Home Exercise Plan   Plans to continue exercise at [facility] (comment)       Frequency Add 3 additional days to program exercise sessions.       Initial Home Exercises Provided 11/07/23          Exercise Comments:   Exercise Comments     Row Name 10/19/23 1045 11/02/23 1057 11/07/23 1117 11/16/23 1135 12/05/23 1107   Exercise Comments Terry [name] tolerated first session of exercise well without symptoms. Reviewed his current exercise routine, THRR, and goals with him. Oriented him to the exercise equipment and stretching routine. Switched from the recumbent elliptical to upright elliptical today. Tolerated well without issues. Reviewed home exercise guidelines and goals with Terry [name]. Discussed increasing workload on the elliptical machine and will increase next session. Reviewed goals with Terry [name].      Exercise Goals and Review:   Exercise Goals     Row Name 10/13/23 1134             Exercise Goals   Increase Physical Activity Yes       Intervention Provide advice, education, support and counseling about physical activity/exercise needs.;Develop an individualized exercise prescription for aerobic and resistive training based on initial evaluation findings, risk stratification, comorbidities and participant's personal goals.       Expected Outcomes Short Term: Attend rehab on a regular basis to increase amount of physical activity.;Long Term: Add in home exercise to make exercise part of routine and to increase amount of physical activity.;Long Term: Exercising regularly at least 3-5 days a week.       Increase Strength and Stamina Yes       Intervention Provide advice, education, support and counseling about physical activity/exercise needs.;Develop an individualized exercise prescription  for aerobic and resistive training based on initial evaluation findings, risk stratification, comorbidities and participant's personal goals.       Expected Outcomes Short Term: Increase workloads from initial exercise prescription for resistance, speed, and METs.;Short Term: Perform resistance training exercises routinely during rehab and add in resistance training at home;Long Term: Improve cardiorespiratory fitness, muscular endurance and strength as measured by increased METs and functional capacity ( )       Able to understand and use rate of perceived exertion (RPE) scale Yes       Intervention Provide education and explanation on how to use RPE scale       Expected Outcomes Short Term: Able to use RPE daily in rehab to express subjective intensity level;Long Term:  Able to use RPE to guide intensity level when exercising independently       Knowledge and understanding of Target Heart Rate Range (THRR) Yes       Intervention Provide education and explanation of THRR including how the numbers were predicted and where they are located for reference       Expected Outcomes Short Term: Able to state/look up THRR;Short Term: Able to use daily as guideline for intensity in rehab;Long Term: Able to use THRR to govern intensity when exercising independently       Understanding of Exercise Prescription Yes       Intervention Provide education, explanation, and written materials on patient's individual exercise prescription       Expected Outcomes Short Term: Able to explain program exercise prescription;Long Term: Able to explain home exercise prescription to exercise independently          Exercise Goals Re-Evaluation :  Exercise Goals  Re-Evaluation     Row Name 10/19/23 1045 11/07/23 1117 12/05/23 1107         Exercise Goal Re-Evaluation   Exercise Goals Review Increase Physical Activity;Increase Strength and Stamina;Able to understand and use rate of perceived exertion (RPE) scale;Knowledge  and understanding of Target Heart Rate Range (THRR) Increase Physical Activity;Increase Strength and Stamina;Able to understand and use rate of perceived exertion (RPE) scale;Knowledge and understanding of Target Heart Rate Range (THRR);Understanding of Exercise Prescription Increase Physical Activity;Increase Strength and Stamina;Able to understand and use rate of perceived exertion (RPE) scale;Knowledge and understanding of Target Heart Rate Range (THRR);Understanding of Exercise Prescription     Comments Terry [name] was able to understand and use the RPE scale appropriately. He is currently exercising at the gym 6 days/week. He starts with a warm-up 15 minutes walking before proceeding to the stair climber for 60 minutes. He does weight machines and free wieghts alternating muscle groups 3-4 sets of 15 repetitions. Reviewed exercise prescription with Terry [name]. He is exercising at the gym 3 days/week in addition to exercise at cardiac rehab. Encourage to increase aerobic exericse from 15 minutes to 30 minutes. he states he's currently working at a medium level of intensity at the gym. His goal is to regain strength and energy and to train without injury. Terry [name] continues to progress well with exercise and has no current exercise concerns. He feels that he's pretty much back to his previous exercise level. He is using the stair climber 15 minutes and the spin bike 15 minutes.     Expected Outcomes Progress workloads as tolerated to help decrease cardiovascular risk. Terry [name] will increase aerobic exercise to 30 minutes 3 days/week. Continue weight machines and free weights 3 days/week. Terry [name] will continue current exercise routine.        Discharge Exercise Prescription (Final Exercise Prescription Changes):  Exercise Prescription Changes - 12/19/23 1030       Response to Exercise   Blood Pressure (Admit) 118/86    Blood Pressure (Exit) 100/60    Heart Rate (Admit) 69 bpm    Heart Rate (Exercise) 103 bpm    Heart  Rate (Exit) 77 bpm    Rating of Perceived Exertion (Exercise) 11    Symptoms None    Duration Continue with 30 min of aerobic exercise without signs/symptoms of physical distress.    Intensity THRR unchanged      Progression   Progression Continue to progress workloads to maintain intensity without signs/symptoms of physical distress.    Average METs 5.1      Resistance Training   Training Prescription Yes    Weight 5 lbs    Reps 10-15    Time 5 Minutes      Interval Training   Interval Training No      Bike   Level 6    Watts 56    Minutes 15    METs 4.2      Elliptical   Level 3   resistance   Speed 3   elevation   Minutes 15    METs 6      Home Exercise Plan   Plans to continue exercise at [employer] (comment)    Frequency Add 3 additional days to program exercise sessions.    Initial Home Exercises Provided 11/07/23          Nutrition:  Target Goals: Understanding of nutrition guidelines, daily intake of sodium 1500mg , cholesterol 200mg , calories 30% from fat and 7% or less from saturated fats,  daily to have 5 or more servings of fruits and vegetables.  Education: Nutrition 1 -Group instruction provided by verbal, written material, interactive activities, discussions, models, and posters to present general guidelines for heart healthy nutrition including macronutrients, label reading, and promoting whole foods over processed counterparts. Education serves as Pensions consultant of discussion of heart healthy eating for all. Written material provided at class time.    Education: Nutrition 2 -Group instruction provided by verbal, written material, interactive activities, discussions, models, and posters to present general guidelines for heart healthy nutrition including sodium, cholesterol, and saturated fat. Providing guidance of habit forming to improve blood pressure, cholesterol, and body weight. Written material provided at class time.     Biometrics:   Pre Biometrics - 10/13/23 1120       Pre Biometrics   Waist Circumference 38.75 inches    Hip Circumference 39 inches    Waist to Hip Ratio 0.99 %    Triceps Skinfold 9 mm    % Body Fat 23.2 %    Grip Strength 27 kg    Flexibility 0 in   unable to reach   Single Leg Stand 10.93 seconds           Nutrition Therapy Plan and Nutrition Goals:  Nutrition Therapy & Goals - 10/21/23 1034       Nutrition Therapy   Diet Heart Healthy Diet    Drug/Food Interactions Statins/Certain Fruits      Personal Nutrition Goals   Nutrition Goal Patient to identify strategies for reducing cardiovascular risk by attending the [employer] education and nutrition series weekly.    Personal Goal #2 Patient to improve diet quality by using the plate method as a guide for meal planning to include lean protein/plant protein, fruits, vegetables, whole grains, nonfat dairy as part of a well-balanced diet.    Comments Terry [name] has medical history of s/p MVR, hyperlipidemia, CHF, PAF, stroke. LDL is not at goal. Patient will benefit from participation in intensive cardiac rehab for nutrition education, exercise, and lifestyle modification.      Intervention Plan   Intervention Prescribe, educate and counsel regarding individualized specific dietary modifications aiming towards targeted core components such as weight, hypertension, lipid management, diabetes, heart failure and other comorbidities.;Nutrition handout(s) given to patient.    Expected Outcomes Short Term Goal: Understand basic principles of dietary content, such as calories, fat, sodium, cholesterol and nutrients.;Long Term Goal: Adherence to prescribed nutrition plan.          Nutrition Assessments:  Nutrition Assessments - 10/21/23 1340       Rate Your Plate Scores   Pre Score 69         MEDIFICTS Score Key: >=70 Need to make dietary changes  40-70 Heart Healthy Diet <= 40 Therapeutic Level Cholesterol Diet  Flowsheet Row INTENSIVE  CARDIAC REHAB from 10/21/2023 in [facility]  Picture Your Plate Total Score on Admission 69   Picture Your Plate Scores: <59 Unhealthy dietary pattern with much room for improvement. 41-50 Dietary pattern unlikely to meet recommendations for good health and room for improvement. 51-60 More healthful dietary pattern, with some room for improvement.  >60 Healthy dietary pattern, although there may be some specific behaviors that could be improved.    Nutrition Goals Re-Evaluation:  Nutrition Goals Re-Evaluation     Row Name 10/21/23 1034             Goals   Current Weight 175 lb 14.8  oz (79.8 kg)       Comment cholesterol 234, HDL 46, LDL 159, triglycerides 156       Expected Outcome Terry [name] has medical history of s/p MVR, hyperlipidemia, CHF, PAF, stroke. LDL is not at goal. Patient will benefit from participation in intensive cardiac rehab for nutrition education, exercise, and lifestyle modification.          Nutrition Goals Discharge (Final Nutrition Goals Re-Evaluation):  Nutrition Goals Re-Evaluation - 10/21/23 1034       Goals   Current Weight 175 lb 14.8 oz (79.8 kg)    Comment cholesterol 234, HDL 46, LDL 159, triglycerides 156    Expected Outcome Terry [name] has medical history of s/p MVR, hyperlipidemia, CHF, PAF, stroke. LDL is not at goal. Patient will benefit from participation in intensive cardiac rehab for nutrition education, exercise, and lifestyle modification.          Psychosocial: Target Goals: Acknowledge presence or absence of significant depression and/or stress, maximize coping skills, provide positive support system. Participant is able to verbalize types and ability to use techniques and skills needed for reducing stress and depression.   Education: Stress, Anxiety, and Depression - Group verbal and visual presentation to define topics covered.  Reviews how body is impacted by stress, anxiety,  and depression.  Also discusses healthy ways to reduce stress and to treat/manage anxiety and depression. Written material provided at class time.   Education: Sleep Hygiene -Provides group verbal and written instruction about how sleep can affect your health.  Define sleep hygiene, discuss sleep cycles and impact of sleep habits. Review good sleep hygiene tips.   Initial Review & Psychosocial Screening:  Initial Psych Review & Screening - 10/13/23 1137       Initial Review   Current issues with None Identified      Family Dynamics   Good Support System? Yes   Pt has spouse and daughters for support     Barriers   Psychosocial barriers to participate in program There are no identifiable barriers or psychosocial needs.      Screening Interventions   Interventions Encouraged to exercise          Quality of Life Scores:   Quality of Life - 10/13/23 1354       Quality of Life   Select Quality of Life      Quality of Life Scores   Health/Function Pre 30 %    Socioeconomic Pre 30 %    Psych/Spiritual Pre 30 %    Family Pre 30 %    GLOBAL Pre 30 %         Scores of 19 and below usually indicate a poorer quality of life in these areas.  A difference of  2-3 points is a clinically meaningful difference.  A difference of 2-3 points in the total score of the Quality of Life Index has been associated with significant improvement in overall quality of life, self-image, physical symptoms, and general health in studies assessing change in quality of life.  PHQ-9: Review Flowsheet       10/13/2023  Depression screen PHQ 2/9  Decreased Interest 0  Down, Depressed, Hopeless 0  PHQ - 2 Score 0  Altered sleeping 0  Tired, decreased energy 0  Change in appetite 0  Feeling bad or failure about yourself  0  Trouble concentrating 0  Moving slowly or fidgety/restless 0  Suicidal thoughts 0  PHQ-9 Score 0  Difficult doing work/chores Not difficult at all  Interpretation of  Total Score  Total Score Depression Severity:  1-4 = Minimal depression, 5-9 = Mild depression, 10-14 = Moderate depression, 15-19 = Moderately severe depression, 20-27 = Severe depression   Psychosocial Evaluation and Intervention:   Psychosocial Re-Evaluation:  Psychosocial Re-Evaluation     Row Name 10/19/23 1333 11/21/23 1808 12/20/23 0940         Psychosocial Re-Evaluation   Current issues with None Identified None Identified None Identified     Comments -- Terry [name] mention having to receive eye injections every month --     Interventions Encouraged to attend Cardiac Rehabilitation for the exercise Encouraged to attend Cardiac Rehabilitation for the exercise Encouraged to attend Cardiac Rehabilitation for the exercise     Continue Psychosocial Services  No Follow up required No Follow up required No Follow up required        Psychosocial Discharge (Final Psychosocial Re-Evaluation):  Psychosocial Re-Evaluation - 12/20/23 0940       Psychosocial Re-Evaluation   Current issues with None Identified    Interventions Encouraged to attend Cardiac Rehabilitation for the exercise    Continue Psychosocial Services  No Follow up required          Vocational Rehabilitation: Provide vocational rehab assistance to qualifying candidates.   Vocational Rehab Evaluation & Intervention:  Vocational Rehab - 10/13/23 1139       Initial Vocational Rehab Evaluation & Intervention   Assessment shows need for Vocational Rehabilitation No   Pt is retired         Education: Education Goals: Education classes will be provided on a variety of topics geared toward better understanding of heart health and risk factor modification. Participant will state understanding/return demonstration of topics presented as noted by education test scores.  Learning Barriers/Preferences:  Learning Barriers/Preferences - 10/13/23 1138       Learning Barriers/Preferences   Learning Barriers Sight;Hearing    Wears glasses and bilateral hearing aids   Learning Preferences Audio;Skilled Demonstration;Computer/Internet;Verbal Instruction;Group Instruction;Video;Individual Instruction;Written Material;Pictoral          General Cardiac Education Topics:  AED/CPR: - Group verbal and written instruction with the use of models to demonstrate the basic use of the AED with the basic ABC's of resuscitation.   Test and Procedures: - Group verbal and visual presentation and models provide information about basic cardiac anatomy and function. Reviews the testing methods done to diagnose heart disease and the outcomes of the test results. Describes the treatment choices: Medical Management, Angioplasty, or Coronary Bypass Surgery for treating various heart conditions including Myocardial Infarction, Angina, Valve Disease, and Cardiac Arrhythmias. Written material provided at class time.   Medication Safety: - Group verbal and visual instruction to review commonly prescribed medications for heart and lung disease. Reviews the medication, class of the drug, and side effects. Includes the steps to properly store meds and maintain the prescription regimen. Written material provided at class time.   Intimacy: - Group verbal instruction through game format to discuss how heart and lung disease can affect sexual intimacy. Written material provided at class time.   Know Your Numbers and Heart Failure: - Group verbal and visual instruction to discuss disease risk factors for cardiac and pulmonary disease and treatment options.  Reviews associated critical values for Overweight/Obesity, Hypertension, Cholesterol, and Diabetes.  Discusses basics of heart failure: signs/symptoms and treatments.  Introduces Heart Failure Zone chart for action plan for heart failure. Written material provided at class time.   Infection Prevention: - Provides verbal and written  material to individual with discussion of infection  control including proper hand washing and proper equipment cleaning during exercise session.   Falls Prevention: - Provides verbal and written material to individual with discussion of falls prevention and safety.   Other: -Provides group and verbal instruction on various topics (see comments)   Knowledge Questionnaire Score:  Knowledge Questionnaire Score - 10/13/23 1226       Knowledge Questionnaire Score   Pre Score 22/24          Core Components/Risk Factors/Patient Goals at Admission:  Personal Goals and Risk Factors at Admission - 10/13/23 1139       Core Components/Risk Factors/Patient Goals on Admission    Weight Management Weight Maintenance    Hypertension Yes    Intervention Provide education on lifestyle modifcations including regular physical activity/exercise, weight management, moderate sodium restriction and increased consumption of fresh fruit, vegetables, and low fat dairy, alcohol moderation, and smoking cessation.;Monitor prescription use compliance.    Expected Outcomes Short Term: Continued assessment and intervention until BP is < 140/31mm HG in hypertensive participants. < 130/49mm HG in hypertensive participants with diabetes, heart failure or chronic kidney disease.;Long Term: Maintenance of blood pressure at goal levels.    Lipids Yes    Intervention Provide education and support for participant on nutrition & aerobic/resistive exercise along with prescribed medications to achieve LDL 70mg , HDL >40mg .    Expected Outcomes Short Term: Participant states understanding of desired cholesterol values and is compliant with medications prescribed. Participant is following exercise prescription and nutrition guidelines.;Long Term: Cholesterol controlled with medications as prescribed, with individualized exercise RX and with personalized nutrition plan. Value goals: LDL < 70mg , HDL > 40 mg.          Education:Diabetes - Individual verbal and written  instruction to review signs/symptoms of diabetes, desired ranges of glucose level fasting, after meals and with exercise. Acknowledge that pre and post exercise glucose checks will be done for 3 sessions at entry of program.   Core Components/Risk Factors/Patient Goals Review:   Goals and Risk Factor Review     Row Name 10/19/23 1334 11/21/23 1809 12/20/23 0941         Core Components/Risk Factors/Patient Goals Review   Personal Goals Review Weight Management/Obesity;Hypertension;Lipids Weight Management/Obesity;Hypertension;Lipids Weight Management/Obesity;Hypertension;Lipids     Review Terry [name] started cardiac rehab on 10/19/23. Terry [name] did well with exercise. Vital signs were stable. Terry [name] is doing well with exercise. Vital signs have been  stable. Terry [name] has increased his met levels and has lost 2.5 kg since starting the program Terry [name] continues to do very well with exercise. Vital signs have remain stable. Terry [name] has increased his met levels and has lost 1.5 kg since starting the program. Terry [name] exercises at a high level for someone his age. Terry [name] participated in the [facility] heart walk this weekend and walked 3 miles. Terry [name] says he has always enjoyed exercising. Terry [name] will tenatively complete cardiac rehab on 01/04/24.     Expected Outcomes Terry [name] will continue to participate in cardiac rehab for exercise, nutrition and lifestyle modificaitons Terry [name] will continue to participate in cardiac rehab for exercise, nutrition and lifestyle modificaitons Terry [name] will continue to participate in cardiac rehab for exercise, nutrition and lifestyle modificaitons        Core Components/Risk Factors/Patient Goals at Discharge (Final Review):   Goals and Risk Factor Review - 12/20/23 0941       Core Components/Risk Factors/Patient Goals Review   Personal Goals Review Weight Management/Obesity;Hypertension;Lipids    Review Terry [name]  continues to do very well with exercise. Vital signs have remain stable. Terry [name] has increased his met levels  and has lost 1.5 kg since starting the program. Terry [name] exercises at a high level for someone his age. Terry [name] participated in the [facility] heart walk this weekend and walked 3 miles. Terry [name] says he has always enjoyed exercising. Terry [name] will tenatively complete cardiac rehab on 01/04/24.    Expected Outcomes Terry [name] will continue to participate in cardiac rehab for exercise, nutrition and lifestyle modificaitons          ITP Comments:  ITP Comments     Row Name 10/13/23 1042 10/19/23 1332 11/21/23 1807 12/20/23 0939     ITP Comments Dr. Wilbert Bihari medical director. Introduction to the [facility] education program/intensive cardiac rehab. Initial orientation packet reviewed with patient. 30 Day ITP Review. Terry [name] started cardiac rehab on 10/19/23. Terry [name] did well with exercise. 30 Day ITP Review. Terry [name] has good attendance and participation with exercise at  cardiac rehab 30 Day ITP Review. Terry [name] continues to have good attendance and participation with exercise at  cardiac rehab       Comments: See ITP Comments

## 2023-12-21 ENCOUNTER — Encounter (HOSPITAL_COMMUNITY)
Admission: RE | Admit: 2023-12-21 | Discharge: 2023-12-21 | Disposition: A | Source: Ambulatory Visit | Attending: Cardiology

## 2023-12-21 DIAGNOSIS — Z9889 Other specified postprocedural states: Secondary | ICD-10-CM | POA: Diagnosis not present

## 2023-12-23 ENCOUNTER — Encounter (HOSPITAL_COMMUNITY)
Admission: RE | Admit: 2023-12-23 | Discharge: 2023-12-23 | Disposition: A | Discharge status: Home / Self Care | Source: Ambulatory Visit | Attending: Cardiology | Admitting: Cardiology

## 2023-12-23 DIAGNOSIS — Z9889 Other specified postprocedural states: Secondary | ICD-10-CM | POA: Diagnosis not present

## 2023-12-26 ENCOUNTER — Encounter (HOSPITAL_COMMUNITY)
Admission: RE | Admit: 2023-12-26 | Discharge: 2023-12-26 | Disposition: A | Source: Ambulatory Visit | Attending: Cardiology

## 2023-12-26 DIAGNOSIS — Z9889 Other specified postprocedural states: Secondary | ICD-10-CM

## 2023-12-28 ENCOUNTER — Encounter (HOSPITAL_COMMUNITY)
Admission: RE | Admit: 2023-12-28 | Discharge: 2023-12-28 | Disposition: A | Source: Ambulatory Visit | Attending: Cardiology

## 2023-12-28 DIAGNOSIS — Z9889 Other specified postprocedural states: Secondary | ICD-10-CM | POA: Diagnosis not present

## 2023-12-30 ENCOUNTER — Encounter (HOSPITAL_COMMUNITY)
Admission: RE | Admit: 2023-12-30 | Discharge: 2023-12-30 | Disposition: A | Source: Ambulatory Visit | Attending: Cardiology

## 2023-12-30 DIAGNOSIS — Z9889 Other specified postprocedural states: Secondary | ICD-10-CM | POA: Diagnosis not present

## 2024-01-02 ENCOUNTER — Encounter (HOSPITAL_COMMUNITY)
Admission: RE | Admit: 2024-01-02 | Discharge: 2024-01-02 | Disposition: A | Discharge status: Home / Self Care | Source: Ambulatory Visit | Attending: Cardiology | Admitting: Cardiology

## 2024-01-02 DIAGNOSIS — Z9889 Other specified postprocedural states: Secondary | ICD-10-CM | POA: Diagnosis not present

## 2024-01-03 DIAGNOSIS — J4521 Mild intermittent asthma with (acute) exacerbation: Secondary | ICD-10-CM | POA: Diagnosis not present

## 2024-01-04 ENCOUNTER — Encounter (HOSPITAL_COMMUNITY)
Admission: RE | Admit: 2024-01-04 | Discharge: 2024-01-04 | Disposition: A | Source: Ambulatory Visit | Attending: Cardiology

## 2024-01-04 DIAGNOSIS — Z9889 Other specified postprocedural states: Secondary | ICD-10-CM | POA: Diagnosis not present

## 2024-01-06 ENCOUNTER — Encounter (HOSPITAL_COMMUNITY)
Admission: RE | Admit: 2024-01-06 | Discharge: 2024-01-06 | Disposition: A | Discharge status: Home / Self Care | Source: Ambulatory Visit | Attending: Cardiology | Admitting: Cardiology

## 2024-01-06 VITALS — BP 108/68 | HR 76 | Ht 72.75 in | Wt 176.4 lb

## 2024-01-06 DIAGNOSIS — Z9889 Other specified postprocedural states: Secondary | ICD-10-CM

## 2024-01-06 NOTE — Progress Notes (Signed)
 Discharge Progress Report  Patient Details  Name: CEDRICK PARTAIN MRN: [id number] Date of Birth: 11-May-1950 Referring Provider:   Flowsheet Row INTENSIVE CARDIAC REHAB ORIENT from 10/13/2023 in [facility]  Referring Provider [name], MD     Number of Visits: 57  Reason for Discharge:  Patient reached a stable level of exercise. Patient independent in their exercise. Patient has met program and personal goals.  Smoking History:  Social History   Tobacco Use  Smoking Status Never  Smokeless Tobacco Never    Diagnosis:  S/P mitral valve repair  ADL UCSD:   Initial Exercise Prescription:  Initial Exercise Prescription - 10/13/23 1100       Date of Initial Exercise RX and Referring Provider   Date 10/13/23    Referring Provider [name], MD    Expected Discharge Date 01/04/24      Bike   Level 2    Watts 54    Minutes 15    METs 4.1      Recumbant Elliptical   Level 2    RPM 60    Watts 75    Minutes 15    METs 4.1      Prescription Details   Frequency (times per week) 3    Duration Progress to 30 minutes of continuous aerobic without signs/symptoms of physical distress      Intensity   THRR 40-80% of Max Heartrate 60-120    Ratings of Perceived Exertion 11-13    Perceived Dyspnea 0-4      Progression   Progression Continue progressive overload as per policy without signs/symptoms or physical distress.      Resistance Training   Training Prescription Yes    Weight 5 lbs    Reps 10-15          Discharge Exercise Prescription (Final Exercise Prescription Changes):  Exercise Prescription Changes - 01/06/24 1035       Response to Exercise   Blood Pressure (Admit) 108/68    Blood Pressure (Exit) 98/68    Heart Rate (Admit) 76 bpm    Heart Rate (Exercise) 109 bpm    Heart Rate (Exit) 84 bpm    Rating of Perceived Exertion (Exercise) 9    Symptoms None    Comments Eligha completed  the cardiac rehab program today.    Duration Continue with 30 min of aerobic exercise without signs/symptoms of physical distress.    Intensity THRR unchanged      Progression   Progression Continue to progress workloads to maintain intensity without signs/symptoms of physical distress.    Average METs 5.3      Resistance Training   Training Prescription Yes    Weight 5 lbs    Reps 10-15    Time 5 Minutes      Interval Training   Interval Training No      Bike   Level 6.2    Watts 63    Minutes 15    METs 4.6      Elliptical   Level 4   resistance   Speed 4   elevation   Minutes 15    METs 6      Home Exercise Plan   Plans to continue exercise at [employer] (comment)    Frequency Add 3 additional days to program exercise sessions.    Initial Home Exercises Provided 11/07/23          Functional Capacity:  6 Minute Walk     Row Name 10/13/23 1304 01/04/24 1043       6 Minute Walk   Phase Initial Discharge    Distance 1650 feet 2012 feet    Distance % Change -- 21.94 %    Distance Feet Change -- 362 ft    Walk Time 6 minutes 6 minutes    # of Rest Breaks 0 0    MPH 3.1 3.81    METS 4.1 5    RPE 9 13.5    Perceived Dyspnea  0 0    VO2 Peak 14.4 17.49    Symptoms No No    Resting HR 71 bpm 68 bpm    Resting BP 118/80 130/88    Resting Oxygen Saturation  98 % --    Exercise Oxygen Saturation  during 6 min walk 97 % --    Max Ex. HR 90 bpm 120 bpm    Max Ex. BP 150/70 170/86    2 Minute Post BP 128/72 140/86       Psychological, QOL, Others - Outcomes: PHQ 2/9:    01/03/2024    8:48 AM 10/13/2023   11:40 AM  Depression screen PHQ 2/9  Decreased Interest 0 0  Down, Depressed, Hopeless 0 0  PHQ - 2 Score 0 0  Altered sleeping 0 0  Tired, decreased energy 0 0  Change in appetite 0 0  Feeling bad or failure about yourself  0 0  Trouble concentrating 0 0  Moving slowly or fidgety/restless 0 0  Suicidal thoughts 0 0  PHQ-9 Score 0 0   Difficult doing work/chores Not difficult at all Not difficult at all    Quality of Life:  Quality of Life - 01/03/24 0850       Quality of Life   Select Quality of Life      Quality of Life Scores   Health/Function Pre 30 %    Health/Function Post 28.8 %    Health/Function % Change -4 %    Socioeconomic Pre 30 %    Socioeconomic Post 30 %    Socioeconomic % Change  0 %    Psych/Spiritual Pre 30 %    Psych/Spiritual Post 29.14 %    Psych/Spiritual % Change -2.87 %    Family Pre 30 %    Family Post 28.8 %    Family % Change -4 %    GLOBAL Pre 30 %    GLOBAL Post 29.12 %    GLOBAL % Change -2.93 %          Personal Goals: Goals established at orientation with interventions provided to work toward goal.  Personal Goals and Risk Factors at Admission - 10/13/23 1139       Core Components/Risk Factors/Patient Goals on Admission    Weight Management Weight Maintenance    Hypertension Yes    Intervention Provide education on lifestyle modifcations including regular physical activity/exercise, weight management, moderate sodium restriction and increased consumption of fresh fruit, vegetables, and low fat dairy, alcohol moderation, and smoking cessation.;Monitor prescription use compliance.    Expected Outcomes Short Term: Continued assessment and intervention until BP is < 140/78mm HG in hypertensive participants. < 130/41mm HG in hypertensive participants with diabetes, heart failure or chronic kidney disease.;Long Term: Maintenance of blood pressure at goal levels.    Lipids Yes    Intervention Provide education and support for participant on nutrition & aerobic/resistive exercise along with prescribed medications to achieve LDL 70mg ,  HDL >40mg .    Expected Outcomes Short Term: Participant states understanding of desired cholesterol values and is compliant with medications prescribed. Participant is following exercise prescription and nutrition guidelines.;Long Term:  Cholesterol controlled with medications as prescribed, with individualized exercise RX and with personalized nutrition plan. Value goals: LDL < 70mg , HDL > 40 mg.           Personal Goals Discharge:  Goals and Risk Factor Review     Row Name 10/19/23 1334 11/21/23 1809 12/20/23 0941         Core Components/Risk Factors/Patient Goals Review   Personal Goals Review Weight Management/Obesity;Hypertension;Lipids Weight Management/Obesity;Hypertension;Lipids Weight Management/Obesity;Hypertension;Lipids     Review Eligha started cardiac rehab on 10/19/23. Eligha did well with exercise. Vital signs were stable. Eligha is doing well with exercise. Vital signs have been  stable. Eligha has increased his met levels and has lost 2.5 kg since starting the program Eligha continues to do very well with exercise. Vital signs have remain stable. Eligha has increased his met levels and has lost 1.5 kg since starting the program. Eligha exercises at a high level for someone his age. [name] participated in the [facility] heart walk this weekend and walked 3 miles. Eligha says he has always enjoyed exercising. Eligha will tenatively complete cardiac rehab on 01/04/24.     Expected Outcomes Eligha will continue to participate in cardiac rehab for exercise, nutrition and lifestyle modificaitons Eligha will continue to participate in cardiac rehab for exercise, nutrition and lifestyle modificaitons Eligha will continue to participate in cardiac rehab for exercise, nutrition and lifestyle modificaitons        Exercise Goals and Review:  Exercise Goals     Row Name 10/13/23 1134             Exercise Goals   Increase Physical Activity Yes       Intervention Provide advice, education, support and counseling about physical activity/exercise needs.;Develop an individualized exercise prescription for aerobic and resistive training based on initial evaluation findings, risk stratification, comorbidities and participant's personal goals.        Expected Outcomes Short Term: Attend rehab on a regular basis to increase amount of physical activity.;Long Term: Add in home exercise to make exercise part of routine and to increase amount of physical activity.;Long Term: Exercising regularly at least 3-5 days a week.       Increase Strength and Stamina Yes       Intervention Provide advice, education, support and counseling about physical activity/exercise needs.;Develop an individualized exercise prescription for aerobic and resistive training based on initial evaluation findings, risk stratification, comorbidities and participant's personal goals.       Expected Outcomes Short Term: Increase workloads from initial exercise prescription for resistance, speed, and METs.;Short Term: Perform resistance training exercises routinely during rehab and add in resistance training at home;Long Term: Improve cardiorespiratory fitness, muscular endurance and strength as measured by increased METs and functional capacity ( )       Able to understand and use rate of perceived exertion (RPE) scale Yes       Intervention Provide education and explanation on how to use RPE scale       Expected Outcomes Short Term: Able to use RPE daily in rehab to express subjective intensity level;Long Term:  Able to use RPE to guide intensity level when exercising independently       Knowledge and understanding of Target Heart Rate Range (THRR) Yes       Intervention Provide education and  explanation of THRR including how the numbers were predicted and where they are located for reference       Expected Outcomes Short Term: Able to state/look up THRR;Short Term: Able to use daily as guideline for intensity in rehab;Long Term: Able to use THRR to govern intensity when exercising independently       Understanding of Exercise Prescription Yes       Intervention Provide education, explanation, and written materials on patient's individual exercise prescription       Expected  Outcomes Short Term: Able to explain program exercise prescription;Long Term: Able to explain home exercise prescription to exercise independently          Exercise Goals Re-Evaluation:  Exercise Goals Re-Evaluation     Row Name 10/19/23 1045 11/07/23 1117 12/05/23 1107 01/04/24 1052 01/06/24 1044     Exercise Goal Re-Evaluation   Exercise Goals Review Increase Physical Activity;Increase Strength and Stamina;Able to understand and use rate of perceived exertion (RPE) scale;Knowledge and understanding of Target Heart Rate Range (THRR) Increase Physical Activity;Increase Strength and Stamina;Able to understand and use rate of perceived exertion (RPE) scale;Knowledge and understanding of Target Heart Rate Range (THRR);Understanding of Exercise Prescription Increase Physical Activity;Increase Strength and Stamina;Able to understand and use rate of perceived exertion (RPE) scale;Knowledge and understanding of Target Heart Rate Range (THRR);Understanding of Exercise Prescription Increase Physical Activity;Increase Strength and Stamina;Able to understand and use rate of perceived exertion (RPE) scale;Knowledge and understanding of Target Heart Rate Range (THRR);Understanding of Exercise Prescription Increase Physical Activity;Increase Strength and Stamina;Able to understand and use rate of perceived exertion (RPE) scale;Knowledge and understanding of Target Heart Rate Range (THRR);Understanding of Exercise Prescription   Comments Noa was able to understand and use the RPE scale appropriately. He is currently exercising at the gym 6 days/week. He starts with a warm-up 15 minutes walking before proceeding to the stair climber for 60 minutes. He does weight machines and free wieghts alternating muscle groups 3-4 sets of 15 repetitions. Reviewed exercise prescription with Eligha. He is exercising at the gym 3 days/week in addition to exercise at cardiac rehab. Encourage to increase aerobic exericse from 15 minutes  to 30 minutes. he states he's currently working at a medium level of intensity at the gym. His goal is to regain strength and energy and to train without injury. Eligha continues to progress well with exercise and has no current exercise concerns. He feels that he's pretty much back to his previous exercise level. He is using the stair climber 15 minutes and the spin bike 15 minutes. Eligha will complete cardiac rehab on Friday. His functional capacity increased 22% as measured by and strength increased 11% as measured by grip strength test. [facility], 30 minutes weights 6 days/week. Eligha completed the cardiac rehab program today and progressed well, achieving 5.3 METs with exercise. He has added yoga/ stretching to his routine before his aerobic exercise like the CR format. He will use the stair climber 20 minutes, the spin bike 20 minutes, and the elliptical machine 20 minutes. He is using the weights exercising different muscle groups each day.   Expected Outcomes Progress workloads as tolerated to help decrease cardiovascular risk. Eligha will increase aerobic exercise to 30 minutes 3 days/week. Continue weight machines and free weights 3 days/week. Eligha will continue current exercise routine. Eligha will continue exercise at the gym upon completion of the cardiac rehab program. Eligha will exercise 60 minutes, 6 days/week to maintain  health and fitness gains.      Nutrition & Weight - Outcomes:  Pre Biometrics - 10/13/23 1120       Pre Biometrics   Waist Circumference 38.75 inches    Hip Circumference 39 inches    Waist to Hip Ratio 0.99 %    Triceps Skinfold 9 mm    % Body Fat 23.2 %    Grip Strength 27 kg    Flexibility 0 in   unable to reach   Single Leg Stand 10.93 seconds          Post Biometrics - 01/06/24 1008        Post  Biometrics   Height 6' 0.75 (1.848 m)    Waist Circumference 37.75 inches    Hip Circumference 38.25 inches     Waist to Hip Ratio 0.99 %    BMI (Calculated) 23.43    Triceps Skinfold 6 mm    % Body Fat 21 %    Grip Strength 30 kg    Flexibility 0 in   unable to reach   Single Leg Stand 14.52 seconds          Nutrition:  Nutrition Therapy & Goals - 10/21/23 1034       Nutrition Therapy   Diet Heart Healthy Diet    Drug/Food Interactions Statins/Certain Fruits      Personal Nutrition Goals   Nutrition Goal Patient to identify strategies for reducing cardiovascular risk by attending the [employer] education and nutrition series weekly.    Personal Goal #2 Patient to improve diet quality by using the plate method as a guide for meal planning to include lean protein/plant protein, fruits, vegetables, whole grains, nonfat dairy as part of a well-balanced diet.    Comments Peighton has medical history of s/p MVR, hyperlipidemia, CHF, PAF, stroke. LDL is not at goal. Patient will benefit from participation in intensive cardiac rehab for nutrition education, exercise, and lifestyle modification.      Intervention Plan   Intervention Prescribe, educate and counsel regarding individualized specific dietary modifications aiming towards targeted core components such as weight, hypertension, lipid management, diabetes, heart failure and other comorbidities.;Nutrition handout(s) given to patient.    Expected Outcomes Short Term Goal: Understand basic principles of dietary content, such as calories, fat, sodium, cholesterol and nutrients.;Long Term Goal: Adherence to prescribed nutrition plan.          Nutrition Discharge:  Nutrition Assessments - 01/03/24 0850       Rate Your Plate Scores   Pre Score 69          Education Questionnaire Score:  Knowledge Questionnaire Score - 01/03/24 0848       Knowledge Questionnaire Score   Pre Score 22/24    Post Score 21/24          Goals reviewed with patient; copy given to patient.Pt graduates from  Intensive/Traditional cardiac rehab program on  01/10/24  with completion of  35 exercise and 22 education sessions. Pt maintained good attendance and progressed nicely during his participation in rehab as evidenced by increased MET level. Eligha increased his distance on his post exercise walk test by 362 feet and lost 1.5 kg.   Medication list reconciled. Repeat  PHQ score- 0 .  Pt has made significant lifestyle changes and should be commended for his success. Eligha  achieved his goals during cardiac rehab.   Pt plans to continue exercise at [employer] 6 days a week using similar equipment that he used  at cardiac rehab. We are proud of Eligha progress! Eligha did very well! Hadassah Elpidio Quan RN BSN

## 2024-01-10 DIAGNOSIS — H34832 Tributary (branch) retinal vein occlusion, left eye, with macular edema: Secondary | ICD-10-CM | POA: Diagnosis not present

## 2024-02-14 ENCOUNTER — Telehealth: Payer: Self-pay | Admitting: Cardiology

## 2024-02-14 NOTE — Telephone Encounter (Signed)
*  STAT* If patient is at the pharmacy, call can be transferred to refill team.   1. Which medications need to be refilled? (please list name of each medication and dose if known) metoprolol succinate (TOPROL XL) 25 MG 24 hr tablet   2. Which pharmacy/location (including street and city if local pharmacy) is medication to be sent to? [id number] - [place], [place] - [address]    3. Do they need a 30 day or 90 day supply? 90

## 2024-02-15 MED ORDER — METOPROLOL SUCCINATE ER 25 MG PO TB24: 12.5000 mg | ORAL_TABLET | Freq: Every day | ORAL | 0 refills | Status: AC

## 2024-02-15 NOTE — Telephone Encounter (Signed)
 Refill sent

## 2024-02-21 DIAGNOSIS — H35033 Hypertensive retinopathy, bilateral: Secondary | ICD-10-CM | POA: Diagnosis not present

## 2024-02-21 DIAGNOSIS — H348112 Central retinal vein occlusion, right eye, stable: Secondary | ICD-10-CM | POA: Diagnosis not present

## 2024-02-21 DIAGNOSIS — H43822 Vitreomacular adhesion, left eye: Secondary | ICD-10-CM | POA: Diagnosis not present

## 2024-02-21 DIAGNOSIS — H34832 Tributary (branch) retinal vein occlusion, left eye, with macular edema: Secondary | ICD-10-CM | POA: Diagnosis not present

## 2024-02-21 DIAGNOSIS — H43811 Vitreous degeneration, right eye: Secondary | ICD-10-CM | POA: Diagnosis not present

## 2024-02-21 DIAGNOSIS — H35373 Puckering of macula, bilateral: Secondary | ICD-10-CM | POA: Diagnosis not present

## 2024-02-21 DIAGNOSIS — H2513 Age-related nuclear cataract, bilateral: Secondary | ICD-10-CM | POA: Diagnosis not present

## 2024-04-05 LAB — LAB REPORT - SCANNED: EGFR: 77

## 2024-04-23 ENCOUNTER — Ambulatory Visit: Admitting: Cardiology
